# Patient Record
Sex: Male | Born: 1935 | Race: White | Hispanic: No | Smoking: Former smoker
Health system: Southern US, Community
[De-identification: ages and names within clinical notes are randomized; demographics above are authoritative.]

## PROBLEM LIST (undated history)

## (undated) DIAGNOSIS — D689 Coagulation defect, unspecified: Secondary | ICD-10-CM

## (undated) DIAGNOSIS — I10 Essential (primary) hypertension: Secondary | ICD-10-CM

## (undated) DIAGNOSIS — I519 Heart disease, unspecified: Secondary | ICD-10-CM

## (undated) DIAGNOSIS — I509 Heart failure, unspecified: Secondary | ICD-10-CM

## (undated) DIAGNOSIS — C801 Malignant (primary) neoplasm, unspecified: Secondary | ICD-10-CM

## (undated) DIAGNOSIS — E119 Type 2 diabetes mellitus without complications: Secondary | ICD-10-CM

## (undated) HISTORY — DX: Coagulation defect, unspecified: D68.9

## (undated) HISTORY — PX: HERNIA REPAIR: SHX51

## (undated) HISTORY — DX: Heart disease, unspecified: I51.9

---

## 2006-08-06 ENCOUNTER — Ambulatory Visit: Payer: Self-pay | Admitting: Orthopaedic Surgery

## 2006-08-27 ENCOUNTER — Ambulatory Visit: Payer: Self-pay | Admitting: Orthopaedic Surgery

## 2006-08-27 ENCOUNTER — Other Ambulatory Visit: Payer: Self-pay

## 2006-09-03 ENCOUNTER — Ambulatory Visit: Payer: Self-pay | Admitting: Orthopaedic Surgery

## 2006-09-05 ENCOUNTER — Emergency Department: Payer: Self-pay | Admitting: Internal Medicine

## 2007-02-17 ENCOUNTER — Ambulatory Visit: Payer: Self-pay | Admitting: Gastroenterology

## 2009-02-05 ENCOUNTER — Ambulatory Visit: Payer: Self-pay | Admitting: Internal Medicine

## 2009-03-03 ENCOUNTER — Ambulatory Visit: Payer: Self-pay | Admitting: Internal Medicine

## 2011-12-11 ENCOUNTER — Ambulatory Visit: Payer: Self-pay | Admitting: Internal Medicine

## 2012-07-28 ENCOUNTER — Ambulatory Visit: Payer: Self-pay | Admitting: Ophthalmology

## 2012-08-18 ENCOUNTER — Ambulatory Visit: Payer: Self-pay | Admitting: Ophthalmology

## 2012-12-15 ENCOUNTER — Ambulatory Visit: Payer: Self-pay

## 2013-09-11 DIAGNOSIS — I10 Essential (primary) hypertension: Secondary | ICD-10-CM | POA: Insufficient documentation

## 2013-09-11 DIAGNOSIS — I2510 Atherosclerotic heart disease of native coronary artery without angina pectoris: Secondary | ICD-10-CM | POA: Diagnosis present

## 2013-09-11 DIAGNOSIS — E119 Type 2 diabetes mellitus without complications: Secondary | ICD-10-CM

## 2014-02-22 ENCOUNTER — Ambulatory Visit: Payer: Self-pay | Admitting: Emergency Medicine

## 2015-12-31 ENCOUNTER — Other Ambulatory Visit: Payer: Self-pay | Admitting: Internal Medicine

## 2015-12-31 DIAGNOSIS — R1084 Generalized abdominal pain: Secondary | ICD-10-CM

## 2016-01-07 ENCOUNTER — Ambulatory Visit
Admission: RE | Admit: 2016-01-07 | Discharge: 2016-01-07 | Disposition: A | Discharge status: Home / Self Care | Payer: Medicare Other | Source: Ambulatory Visit | Attending: Internal Medicine | Admitting: Internal Medicine

## 2016-01-07 DIAGNOSIS — R590 Localized enlarged lymph nodes: Secondary | ICD-10-CM | POA: Diagnosis not present

## 2016-01-07 DIAGNOSIS — K429 Umbilical hernia without obstruction or gangrene: Secondary | ICD-10-CM | POA: Diagnosis not present

## 2016-01-07 DIAGNOSIS — R161 Splenomegaly, not elsewhere classified: Secondary | ICD-10-CM | POA: Diagnosis not present

## 2016-01-07 DIAGNOSIS — I700 Atherosclerosis of aorta: Secondary | ICD-10-CM | POA: Insufficient documentation

## 2016-01-07 DIAGNOSIS — R1084 Generalized abdominal pain: Secondary | ICD-10-CM | POA: Diagnosis not present

## 2016-01-07 DIAGNOSIS — N400 Enlarged prostate without lower urinary tract symptoms: Secondary | ICD-10-CM | POA: Insufficient documentation

## 2016-01-07 HISTORY — DX: Essential (primary) hypertension: I10

## 2016-01-07 HISTORY — DX: Type 2 diabetes mellitus without complications: E11.9

## 2016-01-07 MED ORDER — IOPAMIDOL (ISOVUE-300) INJECTION 61%
75.0000 mL | Freq: Once | INTRAVENOUS | Status: AC | PRN
  Administered 2016-01-07: 75 mL via INTRAVENOUS

## 2016-01-14 ENCOUNTER — Encounter: Payer: Self-pay | Admitting: Hematology and Oncology

## 2016-01-14 ENCOUNTER — Inpatient Hospital Stay: Payer: Medicare Other | Attending: Hematology and Oncology | Admitting: Hematology and Oncology

## 2016-01-14 ENCOUNTER — Inpatient Hospital Stay: Payer: Medicare Other

## 2016-01-14 VITALS — BP 125/76 | HR 99 | Temp 97.0°F | Resp 18 | Ht 71.0 in | Wt 201.5 lb

## 2016-01-14 DIAGNOSIS — R591 Generalized enlarged lymph nodes: Secondary | ICD-10-CM

## 2016-01-14 DIAGNOSIS — R599 Enlarged lymph nodes, unspecified: Secondary | ICD-10-CM

## 2016-01-14 DIAGNOSIS — R634 Abnormal weight loss: Secondary | ICD-10-CM | POA: Diagnosis not present

## 2016-01-14 DIAGNOSIS — R630 Anorexia: Secondary | ICD-10-CM

## 2016-01-14 DIAGNOSIS — E119 Type 2 diabetes mellitus without complications: Secondary | ICD-10-CM | POA: Insufficient documentation

## 2016-01-14 DIAGNOSIS — Z79899 Other long term (current) drug therapy: Secondary | ICD-10-CM | POA: Insufficient documentation

## 2016-01-14 DIAGNOSIS — Z87891 Personal history of nicotine dependence: Secondary | ICD-10-CM | POA: Insufficient documentation

## 2016-01-14 DIAGNOSIS — I519 Heart disease, unspecified: Secondary | ICD-10-CM

## 2016-01-14 DIAGNOSIS — I129 Hypertensive chronic kidney disease with stage 1 through stage 4 chronic kidney disease, or unspecified chronic kidney disease: Secondary | ICD-10-CM

## 2016-01-14 DIAGNOSIS — Z7982 Long term (current) use of aspirin: Secondary | ICD-10-CM | POA: Insufficient documentation

## 2016-01-14 DIAGNOSIS — N189 Chronic kidney disease, unspecified: Secondary | ICD-10-CM | POA: Insufficient documentation

## 2016-01-14 DIAGNOSIS — R109 Unspecified abdominal pain: Secondary | ICD-10-CM | POA: Diagnosis not present

## 2016-01-14 DIAGNOSIS — K5900 Constipation, unspecified: Secondary | ICD-10-CM | POA: Insufficient documentation

## 2016-01-14 DIAGNOSIS — M7989 Other specified soft tissue disorders: Secondary | ICD-10-CM

## 2016-01-14 DIAGNOSIS — N400 Enlarged prostate without lower urinary tract symptoms: Secondary | ICD-10-CM | POA: Insufficient documentation

## 2016-01-14 DIAGNOSIS — R161 Splenomegaly, not elsewhere classified: Secondary | ICD-10-CM | POA: Insufficient documentation

## 2016-01-14 DIAGNOSIS — Z86718 Personal history of other venous thrombosis and embolism: Secondary | ICD-10-CM

## 2016-01-14 DIAGNOSIS — Z7901 Long term (current) use of anticoagulants: Secondary | ICD-10-CM | POA: Insufficient documentation

## 2016-01-14 DIAGNOSIS — B189 Chronic viral hepatitis, unspecified: Secondary | ICD-10-CM

## 2016-01-14 DIAGNOSIS — D689 Coagulation defect, unspecified: Secondary | ICD-10-CM | POA: Insufficient documentation

## 2016-01-14 DIAGNOSIS — Z794 Long term (current) use of insulin: Secondary | ICD-10-CM | POA: Insufficient documentation

## 2016-01-14 LAB — COMPREHENSIVE METABOLIC PANEL
ALT: 29 U/L (ref 17–63)
AST: 37 U/L (ref 15–41)
Albumin: 3.5 g/dL (ref 3.5–5.0)
Alkaline Phosphatase: 47 U/L (ref 38–126)
Anion gap: 9 (ref 5–15)
BUN: 46 mg/dL — ABNORMAL HIGH (ref 6–20)
CO2: 22 mmol/L (ref 22–32)
Calcium: 8.9 mg/dL (ref 8.9–10.3)
Chloride: 102 mmol/L (ref 101–111)
Creatinine, Ser: 2.34 mg/dL — ABNORMAL HIGH (ref 0.61–1.24)
GFR calc Af Amer: 28 mL/min — ABNORMAL LOW (ref >60)
GFR calc non Af Amer: 24 mL/min — ABNORMAL LOW (ref >60)
Glucose, Bld: 198 mg/dL — ABNORMAL HIGH (ref 65–99)
Potassium: 5.5 mmol/L — ABNORMAL HIGH (ref 3.5–5.1)
Sodium: 133 mmol/L — ABNORMAL LOW (ref 135–145)
Total Bilirubin: 0.9 mg/dL (ref 0.3–1.2)
Total Protein: 6.7 g/dL (ref 6.5–8.1)

## 2016-01-14 LAB — CBC WITH DIFFERENTIAL/PLATELET
Basophils Absolute: 0.1 10*3/uL (ref 0–0.1)
Basophils Relative: 1 %
Eosinophils Absolute: 0.2 10*3/uL (ref 0–0.7)
Eosinophils Relative: 4 %
HCT: 48.1 % (ref 40.0–52.0)
Hemoglobin: 15.6 g/dL (ref 13.0–18.0)
Lymphocytes Relative: 11 %
Lymphs Abs: 0.7 10*3/uL — ABNORMAL LOW (ref 1.0–3.6)
MCH: 25.2 pg — ABNORMAL LOW (ref 26.0–34.0)
MCHC: 32.4 g/dL (ref 32.0–36.0)
MCV: 77.8 fL — ABNORMAL LOW (ref 80.0–100.0)
Monocytes Absolute: 0.8 10*3/uL (ref 0.2–1.0)
Monocytes Relative: 14 %
Neutro Abs: 4.4 10*3/uL (ref 1.4–6.5)
Neutrophils Relative %: 70 %
Platelets: 99 10*3/uL — ABNORMAL LOW (ref 150–440)
RBC: 6.18 MIL/uL — ABNORMAL HIGH (ref 4.40–5.90)
RDW: 19 % — ABNORMAL HIGH (ref 11.5–14.5)
WBC: 6.2 10*3/uL (ref 3.8–10.6)

## 2016-01-14 LAB — LACTATE DEHYDROGENASE: LDH: 709 U/L — ABNORMAL HIGH (ref 98–192)

## 2016-01-14 LAB — PSA: PSA: 6.97 ng/mL — ABNORMAL HIGH (ref 0.00–4.00)

## 2016-01-14 LAB — URIC ACID: Uric Acid, Serum: 12.7 mg/dL — ABNORMAL HIGH (ref 4.4–7.6)

## 2016-01-14 NOTE — Progress Notes (Signed)
[facility] day:  01/14/2016  Chief Complaint: Hayden Martin is a 82 y.o. 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 Ouida Sills for assessment and management.  HPI:  The patient was seen by Dr. Ouida Sills on 12/31/2015.  At that time, he noted a 25 pound weight loss since the spring, and 14 pounds since the late summer.  He had mild abdominal pain, no appetite and constipation.  Labs revealed a hematocrit of 40.4, hemoglobin 12.9, MCV 80.5, platelets 137,000, WBC 5000 with an ANC of 2960.  Differential revealed 59% segs, 22% lymphs, 13% monocytes, 4% eosinophils and 1% basophils.  Comprehensive metabolic panel included a BUN of 33, creatinine 1.6, calcium 10.5, albumin 3.2 with normal liver function tests.  PSA was 2.67 on 01/18/2014.  Abdomen and pelvic CT scan on 01/07/2016 revealed multiple solid masses within the spleen.  There was extensive retroperitoneal, portal and mesenteric lymphadenopathy.  Retrocrural node measured 2.1 x 2.0 cm.  Periportal node measured 2.6 x 5.8 cm. Confluent nodal mass is in the retroperitoneum surrounding the great vessels measured 5.6 x 3.8 cmon the left and 5.3 x 2.9 cm on the right.  Left iliac node measured 1.7 x 2.6 cm.  There was possible pleural disease on the left.  The prostate was markedly enlarged.  There was no disease in the liver.  The patient notes a history of left lower extremity thrombosis.  He states that he had 2 clots 1 year apart and is on life long anticoagulation.  He takes Xarelto, but sometimes forgets to take it.  He has chronic renal insufficiency (Cr 1.3- 1.4).  The patient lives alone.  He is able to take care of his activities of daily living (ADLs).  He spends the majority of his day resting.   Past Medical History:  Diagnosis Date  . Clotting disorder ([place])   . Diabetes mellitus without complication ([place])   . Heart disease   . Hypertension     Past  Surgical History:  Procedure Laterality Date  . HERNIA REPAIR      No family history on file.  Social History:  reports that he quit smoking about 30 years ago. He smoked 1.50 packs per day. He does not have any smokeless tobacco history on file. His alcohol and drug histories are not on file.  He was in the [employer].  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  He has 3 children who live locally.  The patient is accompanied by his son, Hayden Martin, today.  Allergies:  Allergies  Allergen Reactions  . Codeine Itching  . Penicillins Itching    Current Medications: Current Outpatient Prescriptions  Medication Sig Dispense Refill  . aspirin (GOODSENSE ASPIRIN) 325 MG tablet Take by mouth.    [name] atorvastatin (LIPITOR) 40 MG tablet Take by mouth.    . carvedilol (COREG) 6.25 MG tablet TAKE ONE (1) TABLET BY MOUTH TWO (2) TIMES DAILY    . insulin aspart (NOVOLOG) 100 UNIT/ML FlexPen Inject into the skin.    [name] ketoconazole (NIZORAL) 2 % cream APPLY AT BEDTIME DAILY TO AFFECTED AREAS    . lisinopril-hydrochlorothiazide (PRINZIDE,ZESTORETIC) 20-25 MG tablet TAKE ONE (1) TABLET BY MOUTH EVERY DAY    . rivaroxaban (XARELTO) 20 MG TABS tablet Take by mouth.    . Insulin Glargine (LANTUS [place]) Inject into the skin. Inject 35 units subcutaneously at bedtime.  No current facility-administered medications for this visit.     Review of Systems:  GENERAL:  No energy.  Rests most of day.  No fevers or sweats.  Weight loss of 30-35 pounds in 2-3 months. PERFORMANCE STATUS (ECOG):  2 HEENT:  No visual changes, runny nose, sore throat, mouth sores or tenderness. Lungs: Shortness of breath on exertion.  Little non-productive cough.  No hemoptysis. Cardiac:  No chest pain, palpitations, orthopnea, or PND. GI:  No appetite.  Two "spells of nausea".   Black stools x 2-3 months.  No vomiting, diarrhea, constipation, or hematochezia. GU:  Decreased urination.  No urgency, frequency, dysuria,  or hematuria. Musculoskeletal:  Back issues.  Joints hurt.  No muscle tenderness. Extremities:  No pain or swelling. Skin:  Pruritus.  No rashes or skin changes. Neuro:  No headache, numbness or weakness, or coordination issues.  Balance issues. Endocrine:  Diabetes.  No thyroid issues, hot flashes or night sweats. Psych:  No mood changes, depression or anxiety. Pain:  No focal pain. Review of systems:  All other systems reviewed and found to be negative.  Physical Exam: Blood pressure 125/76, pulse 99, temperature 97 F (36.1 C), temperature source Tympanic, resp. rate 18, height _0  (1.803 m), weight 201 lb 8 oz (91.4 kg). GENERAL:  Elderly gentleman sitting comfortably in a wheelchair in the exam room in no acute distress.  He needs assistance onto the exam table. MENTAL STATUS:  Alert and oriented to person, place and time. HEAD:  Wearing an Scientist, clinical (histocompatibility and immunogenetics).  Gray hair.  Male pattern baldness.  Normocephalic, atraumatic, face symmetric, no Cushingoid features. EYES:  Blue eyes.  Pupils equal round and reactive to light and accomodation.  No conjunctivitis or scleral icterus. ENT:  Hearing aide.  Oropharynx clear without lesion.  Tongue normal. Mucous membranes moist.  RESPIRATORY:  Decreased breath sounds left base.  Clear to auscultation without rales, wheezes or rhonchi. CARDIOVASCULAR:  Regular rate and rhythm without murmur, rub or gallop. ABDOMEN:  Soft, non-tender, with active bowel sounds, and no hepatomegaly.  Left upper quadrant fullness.  No masses. SKIN:  No rashes, ulcers or lesions. EXTREMITIES:  Left lower extremity edema below the knee.  No skin discoloration or tenderness.  No palpable cords. LYMPH NODES: No palpable cervical, supraclavicular, axillary or inguinal adenopathy  NEUROLOGICAL: Unremarkable. PSYCH:  Appropriate.   Appointment on 01/14/2016  Component Date Value Ref Range Status  . WBC 01/14/2016 6.2  3.8 - 10.6 K/uL Final  . RBC 01/14/2016 6.18*  4.40 - 5.90 MIL/uL Final  . Hemoglobin 01/14/2016 15.6  13.0 - 18.0 g/dL Final  . HCT 01/14/2016 48.1  40.0 - 52.0 % Final  . MCV 01/14/2016 77.8* 80.0 - 100.0 fL Final  . MCH 01/14/2016 25.2* 26.0 - 34.0 pg Final  . MCHC 01/14/2016 32.4  32.0 - 36.0 g/dL Final  . RDW 01/14/2016 19.0* 11.5 - 14.5 % Final  . Platelets 01/14/2016 99* 150 - 440 K/uL Final  . Neutrophils Relative % 01/14/2016 70  % Final  . Neutro Abs 01/14/2016 4.4  1.4 - 6.5 K/uL Final  . Lymphocytes Relative 01/14/2016 11  % Final  . Lymphs Abs 01/14/2016 0.7* 1.0 - 3.6 K/uL Final  . Monocytes Relative 01/14/2016 14  % Final  . Monocytes Absolute 01/14/2016 0.8  0.2 - 1.0 K/uL Final  . Eosinophils Relative 01/14/2016 4  % Final  . Eosinophils Absolute 01/14/2016 0.2  0 - 0.7 K/uL Final  . Basophils Relative 01/14/2016  1  % Final  . Basophils Absolute 01/14/2016 0.1  0 - 0.1 K/uL Final  . Sodium 01/14/2016 133* 135 - 145 mmol/L Final  . Potassium 01/14/2016 5.5* 3.5 - 5.1 mmol/L Final  . Chloride 01/14/2016 102  101 - 111 mmol/L Final  . CO2 01/14/2016 22  22 - 32 mmol/L Final  . Glucose, Bld 01/14/2016 198* 65 - 99 mg/dL Final  . BUN 01/14/2016 46* 6 - 20 mg/dL Final  . Creatinine, Ser 01/14/2016 2.34* 0.61 - 1.24 mg/dL Final  . Calcium 01/14/2016 8.9  8.9 - 10.3 mg/dL Final  . Total Protein 01/14/2016 6.7  6.5 - 8.1 g/dL Final  . Albumin 01/14/2016 3.5  3.5 - 5.0 g/dL Final  . AST 01/14/2016 37  15 - 41 U/L Final  . ALT 01/14/2016 29  17 - 63 U/L Final  . Alkaline Phosphatase 01/14/2016 47  38 - 126 U/L Final  . Total Bilirubin 01/14/2016 0.9  0.3 - 1.2 mg/dL Final  . GFR calc non Af Amer 01/14/2016 24* >60 mL/min Final  . GFR calc Af Amer 01/14/2016 28* >60 mL/min Final   Comment: (NOTE) The eGFR has been calculated using the CKD EPI equation. This calculation has not been validated in all clinical situations. eGFR's persistently <60 mL/min signify possible Chronic Kidney Disease.   . Anion gap 01/14/2016 9   5 - 15 Final  . LDH 01/14/2016 709* 98 - 192 U/L Final  . Uric Acid, Serum 01/14/2016 12.7* 4.4 - 7.6 mg/dL Final  . PSA 01/14/2016 6.97* 0.00 - 4.00 ng/mL Final   Comment: (NOTE) While PSA levels of <=4.0 ng/ml are reported as reference range, some men with levels below 4.0 ng/ml can have prostate cancer and many men with PSA above 4.0 ng/ml do not have prostate cancer.  Other tests such as free PSA, age specific reference ranges, PSA velocity and PSA doubling time may be helpful especially in men less than 70 years old. Performed at [facility]     Assessment:  Hayden Martin is a 82 y.o. male with probable lymphoma presenting with a 30-35 pound weight loss in 2-3 months.  Abdomen and pelvic CT scan on 01/07/2016 revealed multiple solid masses within the spleen.  There was extensive retroperitoneal, portal and mesenteric lymphadenopathy.  Retrocrural node measured 2.1 x 2.0 cm.  Periportal node measured 2.6 x 5.8 cm. Confluent nodal mass is in the retroperitoneum surrounding the great vessels measured 5.6 x 3.8 cmon the left and 5.3 x 2.9 cm on the right.  Left iliac node measured 1.7 x 2.6 cm.  There was possible pleural disease on the left.  The prostate was markedly enlarged.  There was no disease in the liver.  The patient notes a history of left lower extremity thrombosis.  He has had 2 clots 1 year apart and is on life long anticoagulation.  He is on Xarelto.  He has chronic renal insufficiency (Cr 1.3- 1.4).  Symptomatically, he is fatigued.  He has had significant weight loss.  He denies fevers and sweats.  Exam reveals no palpable adenopathy.  Plan: 1. Discuss recent scans and likely diagnosis of lymphoma. 2. Labs today:  CBC with diff, CMP, LDH, uric acid, PSA. 3. PET scan:  assess disease and site for potential biopsy. 4. Left lower extremity duplex:  h/o left lower extremity DVT; new left lower extremity edema. 5. RTC after above.   Lequita Asal, MD   01/14/2016

## 2016-01-14 NOTE — Progress Notes (Signed)
Patient here today as new evaluation regarding mets to spleen.  Patient has had no appetite for about 3 weeks.  States he is living off of tomato soup, fruit cocktail and applesauce. When he tries to eat he gets nauseated once the food goes in his mouth.  He is having loose stools every few hours that he describes as Almendra Loria.  Further states his urine is very scant.

## 2016-01-15 ENCOUNTER — Inpatient Hospital Stay: Payer: Medicare Other

## 2016-01-15 ENCOUNTER — Encounter: Payer: Self-pay | Admitting: Hematology and Oncology

## 2016-01-15 ENCOUNTER — Encounter: Payer: Self-pay | Admitting: *Deleted

## 2016-01-15 ENCOUNTER — Inpatient Hospital Stay
Admission: AD | Admit: 2016-01-15 | Discharge: 2016-01-28 | DRG: 673 | Disposition: A | Discharge status: Skilled Nursing Facility | Payer: Medicare Other | Source: Ambulatory Visit | Attending: Internal Medicine | Admitting: Internal Medicine

## 2016-01-15 DIAGNOSIS — R040 Epistaxis: Secondary | ICD-10-CM | POA: Diagnosis not present

## 2016-01-15 DIAGNOSIS — Z66 Do not resuscitate: Secondary | ICD-10-CM | POA: Diagnosis present

## 2016-01-15 DIAGNOSIS — E875 Hyperkalemia: Secondary | ICD-10-CM | POA: Diagnosis present

## 2016-01-15 DIAGNOSIS — E119 Type 2 diabetes mellitus without complications: Secondary | ICD-10-CM | POA: Diagnosis not present

## 2016-01-15 DIAGNOSIS — R599 Enlarged lymph nodes, unspecified: Secondary | ICD-10-CM

## 2016-01-15 DIAGNOSIS — Z23 Encounter for immunization: Secondary | ICD-10-CM

## 2016-01-15 DIAGNOSIS — R2681 Unsteadiness on feet: Secondary | ICD-10-CM

## 2016-01-15 DIAGNOSIS — K5900 Constipation, unspecified: Secondary | ICD-10-CM | POA: Diagnosis not present

## 2016-01-15 DIAGNOSIS — F419 Anxiety disorder, unspecified: Secondary | ICD-10-CM | POA: Diagnosis not present

## 2016-01-15 DIAGNOSIS — E1122 Type 2 diabetes mellitus with diabetic chronic kidney disease: Secondary | ICD-10-CM | POA: Diagnosis present

## 2016-01-15 DIAGNOSIS — Z7189 Other specified counseling: Secondary | ICD-10-CM | POA: Diagnosis not present

## 2016-01-15 DIAGNOSIS — C9110 Chronic lymphocytic leukemia of B-cell type not having achieved remission: Secondary | ICD-10-CM | POA: Diagnosis present

## 2016-01-15 DIAGNOSIS — C8518 Unspecified B-cell lymphoma, lymph nodes of multiple sites: Secondary | ICD-10-CM | POA: Diagnosis present

## 2016-01-15 DIAGNOSIS — Z7901 Long term (current) use of anticoagulants: Secondary | ICD-10-CM | POA: Diagnosis not present

## 2016-01-15 DIAGNOSIS — E43 Unspecified severe protein-calorie malnutrition: Secondary | ICD-10-CM | POA: Diagnosis present

## 2016-01-15 DIAGNOSIS — Z0189 Encounter for other specified special examinations: Secondary | ICD-10-CM | POA: Diagnosis not present

## 2016-01-15 DIAGNOSIS — D696 Thrombocytopenia, unspecified: Secondary | ICD-10-CM | POA: Diagnosis not present

## 2016-01-15 DIAGNOSIS — Z794 Long term (current) use of insulin: Secondary | ICD-10-CM

## 2016-01-15 DIAGNOSIS — N179 Acute kidney failure, unspecified: Secondary | ICD-10-CM | POA: Diagnosis present

## 2016-01-15 DIAGNOSIS — E871 Hypo-osmolality and hyponatremia: Secondary | ICD-10-CM | POA: Diagnosis present

## 2016-01-15 DIAGNOSIS — E785 Hyperlipidemia, unspecified: Secondary | ICD-10-CM | POA: Diagnosis present

## 2016-01-15 DIAGNOSIS — M6281 Muscle weakness (generalized): Secondary | ICD-10-CM

## 2016-01-15 DIAGNOSIS — I82432 Acute embolism and thrombosis of left popliteal vein: Secondary | ICD-10-CM | POA: Diagnosis present

## 2016-01-15 DIAGNOSIS — I2510 Atherosclerotic heart disease of native coronary artery without angina pectoris: Secondary | ICD-10-CM | POA: Diagnosis present

## 2016-01-15 DIAGNOSIS — R600 Localized edema: Secondary | ICD-10-CM

## 2016-01-15 DIAGNOSIS — N183 Chronic kidney disease, stage 3 unspecified: Secondary | ICD-10-CM

## 2016-01-15 DIAGNOSIS — C8330 Diffuse large B-cell lymphoma, unspecified site: Secondary | ICD-10-CM | POA: Diagnosis not present

## 2016-01-15 DIAGNOSIS — N400 Enlarged prostate without lower urinary tract symptoms: Secondary | ICD-10-CM | POA: Diagnosis present

## 2016-01-15 DIAGNOSIS — R740 Nonspecific elevation of levels of transaminase and lactic acid dehydrogenase [LDH]: Secondary | ICD-10-CM | POA: Diagnosis present

## 2016-01-15 DIAGNOSIS — C8590 Non-Hodgkin lymphoma, unspecified, unspecified site: Secondary | ICD-10-CM | POA: Diagnosis not present

## 2016-01-15 DIAGNOSIS — Z880 Allergy status to penicillin: Secondary | ICD-10-CM

## 2016-01-15 DIAGNOSIS — E872 Acidosis: Secondary | ICD-10-CM | POA: Diagnosis present

## 2016-01-15 DIAGNOSIS — E790 Hyperuricemia without signs of inflammatory arthritis and tophaceous disease: Secondary | ICD-10-CM | POA: Diagnosis present

## 2016-01-15 DIAGNOSIS — I824Z2 Acute embolism and thrombosis of unspecified deep veins of left distal lower extremity: Secondary | ICD-10-CM | POA: Diagnosis present

## 2016-01-15 DIAGNOSIS — Z87891 Personal history of nicotine dependence: Secondary | ICD-10-CM | POA: Diagnosis not present

## 2016-01-15 DIAGNOSIS — T380X5A Adverse effect of glucocorticoids and synthetic analogues, initial encounter: Secondary | ICD-10-CM | POA: Diagnosis not present

## 2016-01-15 DIAGNOSIS — D630 Anemia in neoplastic disease: Secondary | ICD-10-CM | POA: Diagnosis not present

## 2016-01-15 DIAGNOSIS — E11649 Type 2 diabetes mellitus with hypoglycemia without coma: Secondary | ICD-10-CM | POA: Diagnosis not present

## 2016-01-15 DIAGNOSIS — I129 Hypertensive chronic kidney disease with stage 1 through stage 4 chronic kidney disease, or unspecified chronic kidney disease: Secondary | ICD-10-CM | POA: Diagnosis present

## 2016-01-15 DIAGNOSIS — E1165 Type 2 diabetes mellitus with hyperglycemia: Secondary | ICD-10-CM | POA: Diagnosis not present

## 2016-01-15 DIAGNOSIS — Z6828 Body mass index (BMI) 28.0-28.9, adult: Secondary | ICD-10-CM

## 2016-01-15 DIAGNOSIS — R634 Abnormal weight loss: Secondary | ICD-10-CM | POA: Diagnosis not present

## 2016-01-15 DIAGNOSIS — Z515 Encounter for palliative care: Secondary | ICD-10-CM

## 2016-01-15 DIAGNOSIS — D6959 Other secondary thrombocytopenia: Secondary | ICD-10-CM | POA: Diagnosis present

## 2016-01-15 DIAGNOSIS — R531 Weakness: Secondary | ICD-10-CM | POA: Diagnosis present

## 2016-01-15 DIAGNOSIS — N189 Chronic kidney disease, unspecified: Secondary | ICD-10-CM | POA: Diagnosis not present

## 2016-01-15 DIAGNOSIS — R591 Generalized enlarged lymph nodes: Secondary | ICD-10-CM

## 2016-01-15 DIAGNOSIS — Z888 Allergy status to other drugs, medicaments and biological substances status: Secondary | ICD-10-CM

## 2016-01-15 DIAGNOSIS — C8580 Other specified types of non-Hodgkin lymphoma, unspecified site: Secondary | ICD-10-CM

## 2016-01-15 DIAGNOSIS — E883 Tumor lysis syndrome: Secondary | ICD-10-CM | POA: Diagnosis present

## 2016-01-15 DIAGNOSIS — Z86718 Personal history of other venous thrombosis and embolism: Secondary | ICD-10-CM

## 2016-01-15 DIAGNOSIS — R609 Edema, unspecified: Secondary | ICD-10-CM

## 2016-01-15 DIAGNOSIS — R630 Anorexia: Secondary | ICD-10-CM | POA: Diagnosis not present

## 2016-01-15 DIAGNOSIS — I10 Essential (primary) hypertension: Secondary | ICD-10-CM | POA: Diagnosis not present

## 2016-01-15 DIAGNOSIS — I82402 Acute embolism and thrombosis of unspecified deep veins of left lower extremity: Secondary | ICD-10-CM | POA: Diagnosis not present

## 2016-01-15 DIAGNOSIS — Z7982 Long term (current) use of aspirin: Secondary | ICD-10-CM

## 2016-01-15 DIAGNOSIS — Z79899 Other long term (current) drug therapy: Secondary | ICD-10-CM

## 2016-01-15 LAB — GLUCOSE, CAPILLARY: Glucose-Capillary: 148 mg/dL — ABNORMAL HIGH (ref 65–99)

## 2016-01-15 MED ORDER — PNEUMOCOCCAL VAC POLYVALENT 25 MCG/0.5ML IJ INJ: 0.5000 mL | INJECTION | INTRAMUSCULAR | Status: DC

## 2016-01-15 MED ORDER — INSULIN ASPART 100 UNIT/ML ~~LOC~~ SOLN
0.0000 [IU] | Freq: Three times a day (TID) | SUBCUTANEOUS | Status: DC
  Administered 2016-01-16 – 2016-01-22 (×6): 1 [IU] via SUBCUTANEOUS
  Administered 2016-01-24: 17:00:00 7 [IU] via SUBCUTANEOUS
  Administered 2016-01-24 (×2): 3 [IU] via SUBCUTANEOUS
  Administered 2016-01-25: 12:00:00 9 [IU] via SUBCUTANEOUS
  Administered 2016-01-25: 7 [IU] via SUBCUTANEOUS
  Filled 2016-01-15: qty 3
  Filled 2016-01-15 (×4): qty 1
  Filled 2016-01-15: qty 3
  Filled 2016-01-15: qty 1
  Filled 2016-01-15: qty 7
  Filled 2016-01-15: qty 1
  Filled 2016-01-15: qty 7
  Filled 2016-01-15: qty 9

## 2016-01-15 MED ORDER — KETOCONAZOLE 2 % EX CREA
TOPICAL_CREAM | Freq: Every day | CUTANEOUS | Status: DC
  Administered 2016-01-16 – 2016-01-25 (×3): via TOPICAL
  Filled 2016-01-15: qty 15

## 2016-01-15 MED ORDER — ASPIRIN 325 MG PO TABS
325.0000 mg | ORAL_TABLET | Freq: Every day | ORAL | Status: DC
  Administered 2016-01-15 – 2016-01-17 (×3): 325 mg via ORAL
  Filled 2016-01-15 (×3): qty 1

## 2016-01-15 MED ORDER — HYDROCODONE-ACETAMINOPHEN 5-325 MG PO TABS: 1.0000 | ORAL_TABLET | ORAL | Status: DC | PRN

## 2016-01-15 MED ORDER — APIXABAN 5 MG PO TABS
5.0000 mg | ORAL_TABLET | Freq: Two times a day (BID) | ORAL | Status: DC
  Administered 2016-01-16 (×2): 5 mg via ORAL
  Filled 2016-01-15 (×2): qty 1

## 2016-01-15 MED ORDER — ONDANSETRON HCL 4 MG PO TABS: 4.0000 mg | ORAL_TABLET | Freq: Four times a day (QID) | ORAL | Status: DC | PRN

## 2016-01-15 MED ORDER — CARVEDILOL 6.25 MG PO TABS
6.2500 mg | ORAL_TABLET | Freq: Two times a day (BID) | ORAL | Status: DC
  Administered 2016-01-15 – 2016-01-28 (×19): 6.25 mg via ORAL
  Filled 2016-01-15 (×20): qty 1

## 2016-01-15 MED ORDER — INSULIN GLARGINE 100 UNIT/ML ~~LOC~~ SOLN
15.0000 [IU] | Freq: Every day | SUBCUTANEOUS | Status: DC
  Administered 2016-01-15 – 2016-01-17 (×3): 15 [IU] via SUBCUTANEOUS
  Filled 2016-01-15 (×3): qty 0.15

## 2016-01-15 MED ORDER — ONDANSETRON HCL 4 MG/2ML IJ SOLN: 4.0000 mg | Freq: Four times a day (QID) | INTRAMUSCULAR | Status: DC | PRN

## 2016-01-15 MED ORDER — ACETAMINOPHEN 325 MG PO TABS
650.0000 mg | ORAL_TABLET | Freq: Four times a day (QID) | ORAL | Status: DC | PRN
  Administered 2016-01-18: 650 mg via ORAL
  Filled 2016-01-15: qty 2

## 2016-01-15 MED ORDER — ACETAMINOPHEN 650 MG RE SUPP: 650.0000 mg | Freq: Four times a day (QID) | RECTAL | Status: DC | PRN

## 2016-01-15 MED ORDER — SODIUM CHLORIDE 0.9 % IV SOLN
INTRAVENOUS | Status: DC
  Administered 2016-01-15 – 2016-01-16 (×3): via INTRAVENOUS

## 2016-01-15 MED ORDER — RIVAROXABAN 10 MG PO TABS: 20.0000 mg | ORAL_TABLET | Freq: Every day | ORAL | Status: DC

## 2016-01-15 NOTE — Progress Notes (Signed)
ANTICOAGULATION CONSULT NOTE - Initial Consult  Pharmacy Consult for Apixaban  Indication:Hx of DVT  Allergies  Allergen Reactions  . Codeine Itching  . Penicillins Itching   Patient Measurements: Height: 5\' 11"  (180.3 cm) Weight: 203 lb 8 oz (92.3 kg) IBW/kg (Calculated) : 75.3  Vital Signs: Temp: 97.7 F (36.5 C) (11/14 1424) Temp Source: Oral (11/14 1424) BP: 126/63 (11/14 1424) Pulse Rate: 94 (11/14 1424)   Recent Labs  01/14/16 1619  HGB 15.6  HCT 48.1  PLT 99*  CREATININE 2.34*    Estimated Creatinine Clearance: 28.3 mL/min (by C-G formula based on SCr of 2.34 mg/dL (H)).  Medical History: Past Medical History:  Diagnosis Date  . Clotting disorder ([place])   . Diabetes mellitus without complication ([place])   . Heart disease   . Hypertension    Assessment: 82 yo male with worsening renal failure and history of DVT. Patient was taking Xarelto (rivaroxaban) 20mg  daily outpatient. Last dose was 11/14 at 1200. Due to worsening renal failure, MD wants to transition patient to Eliquis (apixaban). Pharmacy consulted for dosing.   Plan:  Will start patient on Apixaban 5mg  BID 24 hours after last Xarelto dose.  Patient will need counseling on new medication prior to discharge.  Monitor Plts, Hgb, and Hct and signs and sx of bleeding.   Pernell Dupre, PharmD Clinical Pharmacist  01/15/2016,5:58 PM

## 2016-01-15 NOTE — H&P (Signed)
[facility] [place] NAME: Hayden Martin    MR#:  [id number]  DATE OF BIRTH:  1931-06-18  DATE OF ADMISSION:  01/15/2016  PRIMARY CARE PHYSICIAN: Kirk Ruths., MD   REQUESTING/REFERRING PHYSICIAN: Joice Lofts MD  CHIEF COMPLAINT:  No chief complaint on file.   HISTORY OF PRESENT ILLNESS: Hayden Martin  is a 82 y.o. male with a known history of  DVT, diabetes, hypertension who is referred from oncology office due to worsening renal failure, elevated LDH level, and elevated uric acid level who was seen by oncology first time in November 13. Patient previously was noted to have weight loss and abnormal blood work and abdominal CT revealed multiple solid masses within the spleen as well as extensive lymphadenopathy. Patient was planned to have a PET scan and a biopsy of this. Who went for follow-up and had repeated blood work which showed his has elevated creatinine and his uric acid level is high and LDH is high as well. Therefore he is admitted for further evaluation and therapy. Patient complains of feeling very weak and tired and has not been able to walk.  PAST MEDICAL HISTORY:   Past Medical History:  Diagnosis Date  . Clotting disorder ([place])   . Diabetes mellitus without complication ([place])   . Heart disease   . Hypertension     PAST SURGICAL HISTORY: Past Surgical History:  Procedure Laterality Date  . HERNIA REPAIR      SOCIAL HISTORY:  Social History  Substance Use Topics  . Smoking status: Former Smoker    Packs/day: 1.50    Quit date: 01/01/1986  . Smokeless tobacco: Former Systems developer     Comment: Smoked for 20 years  . Alcohol use Not on file    FAMILY HISTORY: History reviewed. No pertinent family history.  DRUG ALLERGIES:  Allergies  Allergen Reactions  . Codeine Itching  . Penicillins Itching    REVIEW OF SYSTEMS:   CONSTITUTIONAL: No fever, postive fatigue and weakness.  EYES: No blurred or  double vision.  EARS, NOSE, AND THROAT: No tinnitus or ear pain.  RESPIRATORY: No cough, shortness of breath, wheezing or hemoptysis.  CARDIOVASCULAR: No chest pain, orthopnea, edema.  GASTROINTESTINAL: No nausea, vomiting, diarrhea or abdominal pain.  GENITOURINARY: No dysuria, hematuria.  ENDOCRINE: No polyuria, nocturia,  HEMATOLOGY: No anemia, easy bruising or bleeding SKIN: No rash or lesion. MUSCULOSKELETAL: No joint pain or arthritis.  Positive for left lower extremity swelling NEUROLOGIC: No tingling, numbness, weakness.  PSYCHIATRY: No anxiety or depression.   MEDICATIONS AT HOME:  Prior to Admission medications   Medication Sig Start Date End Date Taking? Authorizing Provider  aspirin (GOODSENSE ASPIRIN) 325 MG tablet Take by mouth.    Historical Provider, MD  atorvastatin (LIPITOR) 40 MG tablet Take by mouth.    Historical Provider, MD  carvedilol (COREG) 6.25 MG tablet TAKE ONE (1) TABLET BY MOUTH TWO (2) TIMES DAILY 02/05/15   Historical Provider, MD  insulin aspart (NOVOLOG) 100 UNIT/ML FlexPen Inject into the skin.    Historical Provider, MD  Insulin Glargine (LANTUS ) Inject into the skin. Inject 35 units subcutaneously at bedtime.    Historical Provider, MD  ketoconazole (NIZORAL) 2 % cream APPLY AT BEDTIME DAILY TO AFFECTED AREAS 12/27/14   Historical Provider, MD  lisinopril-hydrochlorothiazide (PRINZIDE,ZESTORETIC) 20-25 MG tablet TAKE ONE (1) TABLET BY MOUTH EVERY DAY 08/20/15   Historical Provider, MD  rivaroxaban (XARELTO) 20 MG TABS tablet Take by mouth.  Historical Provider, MD      PHYSICAL EXAMINATION:   VITAL SIGNS: Blood pressure 126/63, pulse 94, temperature 97.7 F (36.5 C), temperature source Oral, height 5\' 11"  (1.803 m), weight 203 lb 8 oz (92.3 kg), SpO2 94 %.  GENERAL:  82 y.o.-year-old patient lying in the bed with no acute distress.  EYES: Pupils equal, round, reactive to light and accommodation. No scleral icterus. Extraocular muscles intact.   HEENT: Head atraumatic, normocephalic. Oropharynx and nasopharynx clear.  NECK:  Supple, no jugular venous distention. No thyroid enlargement, no tenderness.  LUNGS: Normal breath sounds bilaterally, no wheezing, rales,rhonchi or crepitation. No use of accessory muscles of respiration.  CARDIOVASCULAR: S1, S2 normal. No murmurs, rubs, or gallops.  ABDOMEN: Soft, nontender, nondistended. Bowel sounds present. No organomegaly or mass.  EXTREMITIES: No pedal edema, cyanosis, or clubbing. Left lower extremity swollen NEUROLOGIC: Cranial nerves II through XII are intact. Muscle strength 5/5 in all extremities. Sensation intact. Gait not checked.  PSYCHIATRIC: The patient is alert and oriented x 3.  SKIN: No obvious rash, lesion, or ulcer.   LABORATORY PANEL:   CBC  Recent Labs Lab 01/14/16 1619  WBC 6.2  HGB 15.6  HCT 48.1  PLT 99*  MCV 77.8*  MCH 25.2*  MCHC 32.4  RDW 19.0*  LYMPHSABS 0.7*  MONOABS 0.8  EOSABS 0.2  BASOSABS 0.1   ------------------------------------------------------------------------------------------------------------------  Chemistries   Recent Labs Lab 01/14/16 1619  NA 133*  K 5.5*  CL 102  CO2 22  GLUCOSE 198*  BUN 46*  CREATININE 2.34*  CALCIUM 8.9  AST 37  ALT 29  ALKPHOS 47  BILITOT 0.9   ------------------------------------------------------------------------------------------------------------------ estimated creatinine clearance is 28.3 mL/min (by C-G formula based on SCr of 2.34 mg/dL (H)). ------------------------------------------------------------------------------------------------------------------ No results for input(s): TSH, T4TOTAL, T3FREE, THYROIDAB in the last 72 hours.  Invalid input(s): FREET3   Coagulation profile No results for input(s): INR, PROTIME in the last 168 hours. ------------------------------------------------------------------------------------------------------------------- No results for input(s):  DDIMER in the last 72 hours. -------------------------------------------------------------------------------------------------------------------  Cardiac Enzymes No results for input(s): CKMB, TROPONINI, MYOGLOBIN in the last 168 hours.  Invalid input(s): CK ------------------------------------------------------------------------------------------------------------------ Invalid input(s): POCBNP  ---------------------------------------------------------------------------------------------------------------  Urinalysis No results found for: COLORURINE, APPEARANCEUR, LABSPEC, PHURINE, GLUCOSEU, HGBUR, BILIRUBINUR, KETONESUR, PROTEINUR, UROBILINOGEN, NITRITE, LEUKOCYTESUR   RADIOLOGY: No results found.  EKG: Orders placed or performed in visit on 08/27/06  . EKG 12-Lead    IMPRESSION AND PLAN: Patient's 82 year old white male being admitted for tumor lysis syndrome  1. Tumor lysis syndrome We'll treat patient with aggressive IV fluids, and oncology will be consult and   2. Acute renal failure Suspect due to tumor lysis syndrome I have discussed with nephrology today will see the patient. If renal function does not improve may need to repeat imaging to further evaluate for any evidence of postobstructive nephropathy  3. Diabetes type 2 I will place patient on sliding scale insulin Decrease his dose of insulin  4. Hyperlipidemia I will hold his Lipitor for now  5. Essential hypertension we will hold lisinopril and HCTZ in light of acute renal failure   6. History of DVT Due to his renal failure will changes Xarelto  To epixiban and doppler his leg   All the records are reviewed and case discussed with ED provider. Management plans discussed with the patient, family and they are in agreement.  CODE STATUS:    Code Status Orders        Start     Ordered   01/15/16  1512  Full code  Continuous     01/15/16 1511    Code Status History    Date Active Date  Inactive Code Status Order ID Comments User Context   This patient has a current code status but no historical code status.    Advance Directive Documentation   [place] Most Recent Value  Type of Advance Directive  Living will  Pre-existing out of facility DNR order (yellow form or pink MOST form)  No data  "MOST" Form in Place?  No data       TOTAL TIME TAKING CARE OF THIS PATIENT:55 minutes.    Dustin Flock M.D on 01/15/2016 at 3:30 PM  Between 7am to 6pm - Pager - [phone]  After 6pm go to [email or web address] - password [email or web address] [facility]  Office  [phone]  CC: Primary care physician; Kirk Ruths., MD

## 2016-01-15 NOTE — Progress Notes (Unsigned)
Dr. Mike Gip reviewed labs from yest. And what was resulted today and pt's LDH -709 likely indicating lymphoma.  Also his uric acid is 12.7, k-5.5 creat- 2.34 all indicating that he has tumor lysis syndrome.  The son [name] was notified by dr . Alvia Grove that pt needs to be put in hospital.  I called the oncology floor and spoke to [place] and gave all info above as report.  Joe the son was going to bring pt to hospital to adm. Desk and then have volunteer take pt to 115.  Pt. Lives alone and does hi sown ADL's and his son checks on him.

## 2016-01-15 NOTE — Consult Note (Signed)
[facility]  CONSULT NOTE    Date: 01/15/2016                  Patient Name:  Hayden Martin  MRN: [id number]  DOB: 1931/09/09  Age / Sex: 82 y.o., male         PCP: Kirk Ruths., MD                 Service Requesting Consult: Dr. Serita Grit                 Reason for Consult: Acute renal failure            History of Present Illness: [name] is a 82 y.o. white male with new onset lymphoma, hypertension, diabetes mellitus type II, hyperlipidemia, coronary artery disease, DVT on xarelto, BPH, RBB, who was admitted to [facility] on 01/15/2016 for tumor lysis syndrome renal insufficiency hyperuremia   Family at bedside.    Medications: Outpatient medications: Prescriptions Prior to Admission  Medication Sig Dispense Refill Last Dose  . aspirin (GOODSENSE ASPIRIN) 325 MG tablet Take by mouth.   Taking  . atorvastatin (LIPITOR) 20 MG tablet Take 20 mg by mouth daily.    Taking  . carvedilol (COREG) 6.25 MG tablet TAKE ONE (1) TABLET BY MOUTH TWO (2) TIMES DAILY   Taking  . insulin aspart (NOVOLOG) 100 UNIT/ML FlexPen Inject 8 Units into the skin 3 (three) times daily with meals.    Taking  . Insulin Glargine (LANTUS [place]) Inject into the skin. Inject 35 units subcutaneously at bedtime.     [name] ketoconazole (NIZORAL) 2 % cream APPLY AT BEDTIME DAILY TO AFFECTED AREAS   Taking  . lisinopril-hydrochlorothiazide (PRINZIDE,ZESTORETIC) 20-25 MG tablet TAKE ONE (1) TABLET BY MOUTH EVERY DAY   Taking  . rivaroxaban (XARELTO) 20 MG TABS tablet Take by mouth.   Taking    Current medications: Current Facility-Administered Medications  Medication Dose Route Frequency Provider Last Rate Last Dose  . 0.9 %  sodium chloride infusion   Intravenous Continuous Dustin Flock, MD 150 mL/hr at 01/15/16 1542    . acetaminophen (TYLENOL) tablet 650 mg  650 mg Oral Q6H PRN Dustin Flock, MD       Or  . acetaminophen (TYLENOL) suppository 650 mg  650 mg Rectal Q6H  PRN Dustin Flock, MD      . aspirin tablet 325 mg  325 mg Oral Daily Dustin Flock, MD   325 mg at 01/15/16 1521  . carvedilol (COREG) tablet 6.25 mg  6.25 mg Oral BID WC Dustin Flock, MD   6.25 mg at 01/15/16 1521  . HYDROcodone-acetaminophen (NORCO/VICODIN) 5-325 MG per tablet 1-2 tablet  1-2 tablet Oral Q4H PRN Dustin Flock, MD      . insulin glargine (LANTUS) injection 15 Units  15 Units Subcutaneous QHS Dustin Flock, MD      . ketoconazole (NIZORAL) 2 % cream   Topical Daily Dustin Flock, MD      . ondansetron (ZOFRAN) tablet 4 mg  4 mg Oral Q6H PRN Dustin Flock, MD       Or  . ondansetron (ZOFRAN) injection 4 mg  4 mg Intravenous Q6H PRN Dustin Flock, MD      . Derrill Memo ON 01/16/2016] pneumococcal 23 valent vaccine (PNU-IMMUNE) injection 0.5 mL  0.5 mL Intramuscular Tomorrow-1000 Dustin Flock, MD          Allergies: Allergies  Allergen Reactions  . Codeine Itching  . Penicillins  Itching      Past Medical History: Past Medical History:  Diagnosis Date  . Clotting disorder ([place])   . Diabetes mellitus without complication ([place])   . Heart disease   . Hypertension      Past Surgical History: Past Surgical History:  Procedure Laterality Date  . HERNIA REPAIR       Family History: History reviewed. No pertinent family history.   Social History: Social History   Social History  . Marital status: Widowed    Spouse name: N/A  . Number of children: N/A  . Years of education: N/A   Occupational History  . Not on file.   Social History Main Topics  . Smoking status: Former Smoker    Packs/day: 1.50    Quit date: 01/01/1986  . Smokeless tobacco: Former Systems developer     Comment: Smoked for 20 years  . Alcohol use Not on file  . Drug use: Unknown  . Sexual activity: Not on file   Other Topics Concern  . Not on file   Social History Narrative  . No narrative on file     Review of Systems: Review of Systems  Constitutional: Positive for  malaise/fatigue and weight loss. Negative for chills, diaphoresis and fever.  HENT: Negative for congestion, ear discharge, ear pain, hearing loss, nosebleeds, sinus pain, sore throat and tinnitus.   Eyes: Negative for blurred vision, double vision, photophobia, pain, discharge and redness.  Respiratory: Negative for cough, hemoptysis, sputum production, shortness of breath, wheezing and stridor.   Cardiovascular: Positive for leg swelling. Negative for chest pain, palpitations, orthopnea, claudication and PND.  Gastrointestinal: Negative for abdominal pain, blood in stool, constipation, diarrhea, heartburn, melena, nausea and vomiting.  Genitourinary: Negative for dysuria, flank pain, frequency, hematuria and urgency.  Musculoskeletal: Negative for back pain, falls, joint pain, myalgias and neck pain.  Skin: Negative for itching and rash.  Neurological: Negative for dizziness, tingling, tremors, sensory change, speech change, focal weakness, seizures, loss of consciousness, weakness and headaches.  Endo/Heme/Allergies: Negative for environmental allergies and polydipsia. Does not bruise/bleed easily.  Psychiatric/Behavioral: Negative for depression, hallucinations, memory loss, substance abuse and suicidal ideas. The patient is not nervous/anxious and does not have insomnia.     Vital Signs: Blood pressure 126/63, pulse 94, temperature 97.7 F (36.5 C), temperature source Oral, height 5' 11"  (1.803 m), weight 92.3 kg (203 lb 8 oz), SpO2 94 %.  Weight trends: Filed Weights   01/15/16 1430 01/15/16 1533 01/15/16 1613  Weight: 92.3 kg (203 lb 8 oz) 92.3 kg (203 lb 8 oz) 92.3 kg (203 lb 8 oz)    Physical Exam: General: NAD,   Head: Normocephalic, atraumatic. Moist oral mucosal membranes  Eyes: Anicteric, PERRL  Neck: Supple, trachea midline  Lungs:  Clear to auscultation  Heart: Regular rate and rhythm  Abdomen:  Soft, nontender,   Extremities:  1+ peripheral edema.  Neurologic:  Nonfocal, moving all four extremities  Skin: No lesions        Lab results: Basic Metabolic Panel:  Recent Labs Lab 01/14/16 1619  NA 133*  K 5.5*  CL 102  CO2 22  GLUCOSE 198*  BUN 46*  CREATININE 2.34*  CALCIUM 8.9    Liver Function Tests:  Recent Labs Lab 01/14/16 1619  AST 37  ALT 29  ALKPHOS 47  BILITOT 0.9  PROT 6.7  ALBUMIN 3.5   No results for input(s): LIPASE, AMYLASE in the last 168 hours. No results for input(s): AMMONIA in the last  168 hours.  CBC:  Recent Labs Lab 01/14/16 1619  WBC 6.2  NEUTROABS 4.4  HGB 15.6  HCT 48.1  MCV 77.8*  PLT 99*    Cardiac Enzymes: No results for input(s): CKTOTAL, CKMB, CKMBINDEX, TROPONINI in the last 168 hours.  BNP: Invalid input(s): POCBNP  CBG: No results for input(s): GLUCAP in the last 168 hours.  Microbiology: No results found for this or any previous visit.  Coagulation Studies: No results for input(s): LABPROT, INR in the last 72 hours.  Urinalysis: No results for input(s): COLORURINE, LABSPEC, PHURINE, GLUCOSEU, HGBUR, BILIRUBINUR, KETONESUR, PROTEINUR, UROBILINOGEN, NITRITE, LEUKOCYTESUR in the last 72 hours.  Invalid input(s): APPERANCEUR    Imaging:  No results found.   Assessment & Plan: [name] is a 82 y.o. white male with new onset lymphoma, hypertension, diabetes mellitus type II, hyperlipidemia, coronary artery disease, DVT on xarelto, BPH, RBB, who was admitted to [facility] on 01/15/2016 for tumor lysis syndrome renal insufficiency hyperuremia   1. Acute renal failure on chronic kidney disease stage III: with hyperkalemia and hyponatremia: baseline creatinine of 1.6, eGFR of 42 from 12/31/15.  CT angiogram on 11/6. Poor PO intake: albumin low at 3.5 Tumor lysis syndrome with hyperuremia - Check G6PD deficiency - Agree with IV fluids - Discussed role of dialysis in acute renal failure with patient and family.     LOS: 0 Mikelle Myrick 11/14/20174:46 PM

## 2016-01-16 ENCOUNTER — Inpatient Hospital Stay: Payer: Medicare Other

## 2016-01-16 LAB — BASIC METABOLIC PANEL
ANION GAP: 7 (ref 5–15)
BUN: 34 mg/dL — ABNORMAL HIGH (ref 6–20)
CHLORIDE: 109 mmol/L (ref 101–111)
CO2: 20 mmol/L — ABNORMAL LOW (ref 22–32)
Calcium: 8 mg/dL — ABNORMAL LOW (ref 8.9–10.3)
Creatinine, Ser: 1.92 mg/dL — ABNORMAL HIGH (ref 0.61–1.24)
GFR calc Af Amer: 36 mL/min — ABNORMAL LOW (ref >60)
GFR, EST NON AFRICAN AMERICAN: 31 mL/min — AB (ref >60)
Glucose, Bld: 145 mg/dL — ABNORMAL HIGH (ref 65–99)
POTASSIUM: 5.1 mmol/L (ref 3.5–5.1)
SODIUM: 136 mmol/L (ref 135–145)

## 2016-01-16 LAB — CBC
HCT: 41.5 % (ref 40.0–52.0)
HEMOGLOBIN: 13.5 g/dL (ref 13.0–18.0)
MCH: 25.9 pg — ABNORMAL LOW (ref 26.0–34.0)
MCHC: 32.5 g/dL (ref 32.0–36.0)
MCV: 79.5 fL — AB (ref 80.0–100.0)
PLATELETS: 92 10*3/uL — AB (ref 150–440)
RBC: 5.21 MIL/uL (ref 4.40–5.90)
RDW: 19.6 % — ABNORMAL HIGH (ref 11.5–14.5)
WBC: 4.3 10*3/uL (ref 3.8–10.6)

## 2016-01-16 LAB — GLUCOSE 6 PHOSPHATE DEHYDROGENASE
G6PDH: 11 U/g{Hb} (ref 4.6–13.5)
HEMOGLOBIN: 12.9 g/dL (ref 12.6–17.7)

## 2016-01-16 LAB — GLUCOSE, CAPILLARY
GLUCOSE-CAPILLARY: 126 mg/dL — AB (ref 65–99)
GLUCOSE-CAPILLARY: 143 mg/dL — AB (ref 65–99)
GLUCOSE-CAPILLARY: 129 mg/dL — AB (ref 65–99)
GLUCOSE-CAPILLARY: 137 mg/dL — AB (ref 65–99)
Glucose-Capillary: 122 mg/dL — ABNORMAL HIGH (ref 65–99)

## 2016-01-16 LAB — LACTATE DEHYDROGENASE: LDH: 656 U/L — ABNORMAL HIGH (ref 98–192)

## 2016-01-16 LAB — PHOSPHORUS: PHOSPHORUS: 2.4 mg/dL — AB (ref 2.5–4.6)

## 2016-01-16 MED ORDER — SODIUM BICARBONATE 650 MG PO TABS
650.0000 mg | ORAL_TABLET | Freq: Two times a day (BID) | ORAL | Status: DC
  Administered 2016-01-16 – 2016-01-23 (×15): 650 mg via ORAL
  Filled 2016-01-16 (×15): qty 1

## 2016-01-16 MED ORDER — FLUDEOXYGLUCOSE F - 18 (FDG) INJECTION
12.0000 | Freq: Once | INTRAVENOUS | Status: AC | PRN
  Administered 2016-01-16: 12 via INTRAVENOUS

## 2016-01-16 MED ORDER — SODIUM BICARBONATE 8.4 % IV SOLN
INTRAVENOUS | Status: DC
  Filled 2016-01-16 (×3): qty 150

## 2016-01-16 MED ORDER — SODIUM CHLORIDE 0.9 % IV SOLN
INTRAVENOUS | Status: DC
  Administered 2016-01-16 – 2016-01-18 (×5): via INTRAVENOUS

## 2016-01-16 MED ORDER — ALLOPURINOL 100 MG PO TABS
300.0000 mg | ORAL_TABLET | Freq: Every day | ORAL | Status: DC
  Administered 2016-01-16 – 2016-01-28 (×13): 300 mg via ORAL
  Filled 2016-01-16 (×13): qty 3

## 2016-01-16 NOTE — Care Management Important Message (Signed)
Important Message  Patient Details  Name: Hayden Martin MRN: [id number] Date of Birth: March 24, 1931   Medicare Important Message Given:  Yes    Shelbie Ammons, RN 01/16/2016, 11:00 AM

## 2016-01-16 NOTE — Care Management (Signed)
Admitted to [facility] with the diagnosis of tumor lysis. Lives alone. Son is [name] [phone]). Seen Dr. Ouida Sills 2 weeks ago. No home health. No skilled facility. No home oxygen. No Life Alert. Prescriptions are filled at [facility]. No falls. Weight loss of 30 lbs since May. No falls. Uses a cane or rolling walker to aid in ambulation.  Scheduled for PET scan today. Shelbie Ammons RN MSN CCM Care Management [phone]

## 2016-01-16 NOTE — Progress Notes (Signed)
Dr. Mike Gip paged to see if she will be rounding on patient today. Dr. Mike Gip stated she is in [place] on Wednesday's but plans to see patient tomorrow (01/17/16) after 5pm. Patient and family updated.

## 2016-01-16 NOTE — Progress Notes (Signed)
Initial Nutrition Assessment  DOCUMENTATION CODES:   Severe malnutrition in context of chronic illness  INTERVENTION:  1. K[employer] pudding TID, patient does not like ONS  NUTRITION DIAGNOSIS:   Malnutrition related to chronic illness as evidenced by percent weight loss, energy intake < 75% for > or equal to 1 month.  GOAL:   Patient will meet greater than or equal to 90% of their needs  MONITOR:   PO intake, Supplement acceptance, I & O's, Labs, Weight trends  REASON FOR ASSESSMENT:   Malnutrition Screening Tool    ASSESSMENT:   Hayden Martin  is a 82 y.o. male with a known history of  DVT, diabetes, hypertension who is referred from oncology office due to worsening renal failure, elevated LDH level, and elevated uric acid level who was seen by oncology first time in November 13  Spoke with patient's Sons, daughter in Sports coach at bedside. Patient was sleeping during my visit, did not wake. Per Sons, patient is hard of hearing. They admit to 34# wt loss over 6 weeks with patient. Per care everywhere, he exhibits a 24#/10.5% severe wt loss over 3 months.  They state at home he was eating well until the onset of constant diarrhea. At that point, "anything he put in his mouth caused nausea" and he only ate fruit cocktail, jello, and ate tomato soup. Everything continued to cause diarrhea. Patient does not like ONS  Unable to complete Nutrition-Focused physical exam at this time.   Labs and medications reviewed: CBGs acceptable NS @ 151mL/hr  Diet Order:  Diet NPO time specified Except for: Ice Chips, Sips with Meds  Skin:  Reviewed, no issues  Last BM:  01/15/2016  Height:   Ht Readings from Last 1 Encounters:  01/15/16 5\' 11"  (1.803 m)    Weight:   Wt Readings from Last 1 Encounters:  01/15/16 203 lb 8 oz (92.3 kg)    Ideal Body Weight:  78.18 kg  BMI:  Body mass index is 28.38 kg/m.  Estimated Nutritional Needs:   Kcal:  1845-2306  calories  Protein:  120-140 gm  Fluid:  >/= 2L  EDUCATION NEEDS:   No education needs identified at this time  Satira Anis. Chanah Tidmore, MS, RD LDN Inpatient Clinical Dietitian Pager [phone]

## 2016-01-16 NOTE — Progress Notes (Signed)
Dr. Ether Griffins notified patient is scheduled for PET Scan today around lunch. Blood sugar 126 currently. Verbal order read back and verified to continue current IVF's, D/C Sodium Bicarb w/ Dextrose ordered, and make patient NPO.   Patient updated on plan of care for today.

## 2016-01-16 NOTE — Evaluation (Signed)
Physical Therapy Evaluation Patient Details Name: Hayden Martin MRN: [id number] DOB: 19-Dec-1931 Today's Date: 01/16/2016   History of Present Illness  Hayden Martin  is a 82 y.o. male with a known history of  DVT, diabetes, hypertension who is referred from oncology office due to worsening renal failure, elevated LDH level, and elevated uric acid level who was seen by oncology first time in November 13. Patient previously was noted to have weight loss and abnormal blood work and abdominal CT revealed multiple solid masses within the spleen as well as extensive lymphadenopathy. Patient was planned to have a PET scan and a biopsy of this. Who went for follow-up and had repeated blood work which showed his has elevated creatinine and his uric acid level is high and LDH is high as well. Therefore he is admitted for further evaluation and therapy. Patient complains of feeling very weak and tired and has not been able to walk.  Clinical Impression  Pt admitted with above diagnosis. Pt currently with functional limitations due to the deficits listed below (see PT Problem List).  Pt is generally weak with functional mobility. He requires minA+1 for bed mobility and transfers. Poor balance with sit to stand transfers demonstrating poor anterior weight shifting and posterior LOB. Once upright pt able to stabilize with rolling walker. He is able to ambulate in hallway but with decreased speed and step length. Poor scanning of visual environment with fatigue reported. SaO2 remains 91-92% on room air with ambulation. Per patient and family current mobility is declined from his baseline. Pt has good support from family but does not have 24/7 assist at home. At this time pt is a high fall risk and will need SNF placement to return to prior level of function. Pt will benefit from skilled PT services to address deficits in strength, balance, and mobility in order to return to full function at home.     Follow Up  Recommendations SNF    Equipment Recommendations  None recommended by PT    Recommendations for Other Services       Precautions / Restrictions Precautions Precautions: Fall Restrictions Weight Bearing Restrictions: No      Mobility  Bed Mobility Overal bed mobility: Needs Assistance Bed Mobility: Supine to Sit     Supine to sit: Min assist     General bed mobility comments: Pt requires minA+1 to go from L sidelying to sitting upright at EOB. Heavy use of bed rails and HOB elevated during bed mobility. Requires increased time and effort to perform. Increased time required to scoot forward toward EOB  Transfers Overall transfer level: Needs assistance Equipment used: Rolling walker (2 wheeled) Transfers: Sit to/from Stand Sit to Stand: Min assist         General transfer comment: Pt requires minA+1 due to posterior LOB during sit to stand transfers. Attempts to pull up on walker and requires cues to push off from sitting surface. Once upright pt able to stabilize with UE support on rolling walker  Ambulation/Gait Ambulation/Gait assistance: Min guard Ambulation Distance (Feet): 150 Feet Assistive device: Rolling walker (2 wheeled) Gait Pattern/deviations: Decreased step length - right;Decreased step length - left Gait velocity: Decreased Gait velocity interpretation: <1.8 ft/sec, indicative of risk for recurrent falls General Gait Details: Pt demonstrates decreased step length and gait speed. Reports increased fatigue during ambulation and gait speed is functional for limited household ambulation. Decreased scanning of visual environment. SaO2 91-92% during ambulation on room air. Pt reports decline from baseline mobility  Stairs            Wheelchair Mobility    Modified Rankin (Stroke Patients Only)       Balance Overall balance assessment: Needs assistance Sitting-balance support: No upper extremity supported Sitting balance-Leahy Scale: Fair      Standing balance support: No upper extremity supported Standing balance-Leahy Scale: Fair Standing balance comment: Fair in wide stance but generally prefers support on rolling walker. Pt demonstrates difficulty with balance during transfers and turns                             Pertinent Vitals/Pain Pain Assessment: No/denies pain    Home Living Family/patient expects to be discharged to:: Private residence Living Arrangements: Alone Available Help at Discharge: Family Type of Home: House Home Access: Stairs to enter Entrance Stairs-Rails: None Entrance Stairs-Number of Steps: 4 Home Layout: One level Home Equipment: Environmental consultant - 2 wheels;Cane - single point;Other (comment);Wheelchair - manual (lift chair, no grab bars)      Prior Function Level of Independence: Needs assistance   Gait / Transfers Assistance Needed: Independent for ambulation limited community distances without assistive device. No falls in the last 12 months  ADL's / Homemaking Assistance Needed: Independent with ADLs. Assist from family with IADLs        Hand Dominance   Dominant Hand: Right    Extremity/Trunk Assessment   Upper Extremity Assessment: Overall WFL for tasks assessed           Lower Extremity Assessment: Generalized weakness         Communication   Communication: HOH  Cognition Arousal/Alertness: Awake/alert Behavior During Therapy: WFL for tasks assessed/performed Overall Cognitive Status: Within Functional Limits for tasks assessed                      General Comments      Exercises     Assessment/Plan    PT Assessment Patient needs continued PT services  PT Problem List Decreased strength;Decreased activity tolerance;Decreased balance;Decreased mobility;Decreased safety awareness          PT Treatment Interventions DME instruction;Gait training;Functional mobility training;Stair training;Therapeutic activities;Therapeutic exercise;Balance  training;Neuromuscular re-education;Patient/family education    PT Goals (Current goals can be found in the Care Plan section)  Acute Rehab PT Goals Patient Stated Goal: Return to prior level of function at home PT Goal Formulation: With patient/family Time For Goal Achievement: 01/30/16 Potential to Achieve Goals: Good    Frequency Min 2X/week   Barriers to discharge Decreased caregiver support Pt has intermittent assist from family but not 24/7. At home alone throughout the day    Co-evaluation               End of Session Equipment Utilized During Treatment: Gait belt Activity Tolerance: Patient limited by fatigue Patient left: in chair;with call bell/phone within reach;with chair alarm set;with family/visitor present           Time: 1610-1640 PT Time Calculation (min) (ACUTE ONLY): 30 min   Charges:   PT Evaluation $PT Eval Low Complexity: 1 Procedure PT Treatments $Gait Training: 8-22 mins   PT G Codes:       Lyndel Safe Sidnee Gambrill PT, DPT   Naleah Kofoed 01/16/2016, 5:04 PM

## 2016-01-16 NOTE — Progress Notes (Signed)
[facility]  ROUNDING NOTE   Subjective:   Son at bedside.  IVF: NS at 172m/hr  PET for today.   Creatinine 1.92 (2.34)  Objective:  Vital signs in last 24 hours:  Temp:  [97.7 F (36.5 C)-98.2 F (36.8 C)] 98.2 F (36.8 C) (11/15 0517) Pulse Rate:  [77-94] 77 (11/15 0517) Resp:  [18] 18 (11/15 0517) BP: (123-136)/(56-63) 136/56 (11/15 0517) SpO2:  [94 %-96 %] 96 % (11/15 0517) Weight:  [92.3 kg (203 lb 8 oz)] 92.3 kg (203 lb 8 oz) (11/14 1613)  Weight change:  Filed Weights   01/15/16 1430 01/15/16 1533 01/15/16 1613  Weight: 92.3 kg (203 lb 8 oz) 92.3 kg (203 lb 8 oz) 92.3 kg (203 lb 8 oz)    Intake/Output: I/O last 3 completed shifts: In: 1972.5 [P.O.:240; I.V.:1732.5] Out: -    Intake/Output this shift:  No intake/output data recorded.  Physical Exam: General: NAD,   Head: Normocephalic, atraumatic. Moist oral mucosal membranes  Eyes: Anicteric, PERRL  Neck: Supple, trachea midline  Lungs:  Clear to auscultation  Heart: Regular rate and rhythm  Abdomen:  Soft, nontender,   Extremities:  left extremity with 1+ edema, no right lower extremity edema  Neurologic: Nonfocal, moving all four extremities  Skin: No lesions       Basic Metabolic Panel:  Recent Labs Lab 01/14/16 1619 01/16/16 0445  NA 133* 136  K 5.5* 5.1  CL 102 109  CO2 22 20*  GLUCOSE 198* 145*  BUN 46* 34*  CREATININE 2.34* 1.92*  CALCIUM 8.9 8.0*    Liver Function Tests:  Recent Labs Lab 01/14/16 1619  AST 37  ALT 29  ALKPHOS 47  BILITOT 0.9  PROT 6.7  ALBUMIN 3.5   No results for input(s): LIPASE, AMYLASE in the last 168 hours. No results for input(s): AMMONIA in the last 168 hours.  CBC:  Recent Labs Lab 01/14/16 1619 01/16/16 0445  WBC 6.2 4.3  NEUTROABS 4.4  --   HGB 15.6 13.5  HCT 48.1 41.5  MCV 77.8* 79.5*  PLT 99* 92*    Cardiac Enzymes: No results for input(s): CKTOTAL, CKMB, CKMBINDEX, TROPONINI in the last 168  hours.  BNP: Invalid input(s): POCBNP  CBG:  Recent Labs Lab 01/15/16 2141 01/16/16 0743 01/16/16 1119  GLUCAP 148* 126* 143*    Microbiology: No results found for this or any previous visit.  Coagulation Studies: No results for input(s): LABPROT, INR in the last 72 hours.  Urinalysis: No results for input(s): COLORURINE, LABSPEC, PHURINE, GLUCOSEU, HGBUR, BILIRUBINUR, KETONESUR, PROTEINUR, UROBILINOGEN, NITRITE, LEUKOCYTESUR in the last 72 hours.  Invalid input(s): APPERANCEUR    Imaging: [place]al  Result Date: 01/15/2016 CLINICAL DATA:  Acute renal failure syndrome, chronic renal disease stage III EXAM: RENAL / URINARY TRACT ULTRASOUND COMPLETE COMPARISON:  CT from 01/07/2016 FINDINGS: Right Kidney: Length: 11.1 cm. Echogenicity within normal limits. No mass or hydronephrosis visualized. Left Kidney: Length: 11.7 cm. Echogenicity within normal limits. No mass or hydronephrosis visualized. Bladder: The bladder is contracted which may account the mild thickened appearance of the bladder wall up to 5 mm. No calculus or focal mass. Incidental note is made of postop megaly with the prostate measuring approximately 9.1 x 7.7 x 7.3 cm in craniocaudad by AP by transverse dimension. Heterogeneous ill-defined masses of the spleen mass seen on CT. The spleen measures approximately 15 x 8.4 cm. IMPRESSION: Kidneys and bladder are without acute appearing abnormality. Heterogeneous solid masslike abnormalities of the large  spleen as noted on recent CT may reflect lymphoma/leukemia. There is prostatomegaly as well. Electronically Signed   By: Ashley Royalty M.D.   On: 01/15/2016 17:45   US Venous Img Lower Bilateral  Result Date: 01/16/2016 CLINICAL DATA:  Leg swelling for 2 weeks. History of left popliteal DVT in 2014. EXAM: BILATERAL LOWER EXTREMITY VENOUS DOPPLER ULTRASOUND TECHNIQUE: Gray-scale sonography with graded compression, as well as color Doppler and duplex ultrasound were performed  to evaluate the lower extremity deep venous systems from the level of the common femoral vein and including the common femoral, femoral, profunda femoral, popliteal and calf veins including the posterior tibial, peroneal and gastrocnemius veins when visible. The superficial great saphenous vein was also interrogated. Spectral Doppler was utilized to evaluate flow at rest and with distal augmentation maneuvers in the common femoral, femoral and popliteal veins. COMPARISON:  12/15/2012 FINDINGS: RIGHT LOWER EXTREMITY Normal compressibility, augmentation and color Doppler flow in the right common femoral vein, right femoral vein and right popliteal vein. The right saphenofemoral junction is patent. Right profunda femoral vein is patent without thrombus. Visualized right deep calf veins are patent without thrombus. LEFT LOWER EXTREMITY Normal compressibility and color Doppler flow in the left common femoral vein. The left saphenofemoral junction is patent. Left profunda femoral vein is patent without thrombus. Normal compression and color Doppler flow in the left femoral vein. Partial compressibility of the proximal left popliteal vein with nonocclusive thrombus. The distal left popliteal vein is not compressible with occlusive thrombus. There appears to be thrombus within the left peroneal and posterior tibial veins. IMPRESSION: Positive for deep venous thrombosis in the left lower extremity. There is thrombus involving the left calf veins and the left popliteal vein. The clot burden in the left popliteal vein is greater than it was in 2014 suggesting that this is acute thrombus rather than just chronic thrombus. Negative for deep venous thrombosis in right lower extremity. Electronically Signed   By: Markus Daft M.D.   On: 01/16/2016 07:49     Medications:   . sodium chloride 100 mL/hr at 01/16/16 1149   . allopurinol  300 mg Oral Daily  . apixaban  5 mg Oral BID  . aspirin  325 mg Oral Daily  . carvedilol   6.25 mg Oral BID WC  . insulin aspart  0-9 Units Subcutaneous TID WC  . insulin glargine  15 Units Subcutaneous QHS  . ketoconazole   Topical Daily  . pneumococcal 23 valent vaccine  0.5 mL Intramuscular Tomorrow-1000  . sodium bicarbonate  650 mg Oral BID   acetaminophen **OR** acetaminophen, HYDROcodone-acetaminophen, ondansetron **OR** ondansetron (ZOFRAN) IV  Assessment/ Plan:  [name] is a 82 y.o. white male with new onset lymphoma, hypertension, diabetes mellitus type II, hyperlipidemia, coronary artery disease, DVT on xarelto, BPH, RBB, who was admitted to [facility] on 01/15/2016 for tumor lysis syndrome renal insufficiency hyperuremia   1. Acute renal failure on chronic kidney disease stage III: with hyperkalemia and hyponatremia: baseline creatinine of 1.6, eGFR of 42 from 12/31/15.  CT angiogram on 11/6. Poor PO intake: albumin low at 3.5  2. Tumor lysis syndrome with hyperuremia - G6PD deficiency pending - Agree with IV fluids: change to NS at 150m/hr - Discussed role of dialysis in acute renal failure with patient and family.  3. Metabolic acidosis: from NS - started on sodium bicarbonate PO   LOS: 1[place] SSchuyler11/15/201712:45 PM

## 2016-01-16 NOTE — Progress Notes (Signed)
[facility] [place] NAME: Hayden Martin    MR#:  [id number]  DATE OF BIRTH:  December 01, 1931  SUBJECTIVE:  CHIEF COMPLAINT:  No chief complaint on file. The patient is a 82 year old Caucasian male with past medical history significant for history of suspected lymphoma, DVT, diabetes, essential hypertension, who presents to the hospital with complaints of elevated LDH, worsening renal failure, elevated uric acid level. He was admitted from oncology office as directed. Admit. He was complaining of feeling weak and tired and not being able to walk. Labs revealed creatinine of 2.34, hyperkalemia, potassium of 5.5, hyponatremia, sodium 133, elevated uric acid level to 12.7, patient was rehydrated, his creatinine improved to 1.92. Today. The patient was seen by oncologist and nephrologist, recommended to continue IV fluids, discussed the role of dialysis and acute renal failure this patient and family. He was initiated on her sodium bicarbonate because of acidosis. Oncologist is planning PET scan to determine the site of biopsy.  Review of Systems  Constitutional: Negative for chills, fever and weight loss.  HENT: Negative for congestion.   Eyes: Negative for blurred vision and double vision.  Respiratory: Negative for cough, sputum production, shortness of breath and wheezing.   Cardiovascular: Negative for chest pain, palpitations, orthopnea, leg swelling and PND.  Gastrointestinal: Negative for abdominal pain, blood in stool, constipation, diarrhea, nausea and vomiting.  Genitourinary: Negative for dysuria, frequency, hematuria and urgency.  Musculoskeletal: Negative for falls.  Neurological: Positive for weakness. Negative for dizziness, tremors, focal weakness and headaches.  Endo/Heme/Allergies: Does not bruise/bleed easily.  Psychiatric/Behavioral: Negative for depression. The patient does not have insomnia.     VITAL SIGNS: Blood pressure (!)  136/56, pulse 77, temperature 98.2 F (36.8 C), temperature source Oral, resp. rate 18, height 5\' 11"  (1.803 m), weight 92.3 kg (203 lb 8 oz), SpO2 96 %.  PHYSICAL EXAMINATION:   GENERAL:  82 y.o.-year-old patient lying in the bed with no acute distress. Pale and weak, chronically ill appearance, oral mucosa is somewhat dry EYES: Pupils equal, round, reactive to light and accommodation. No scleral icterus. Extraocular muscles intact.  HEENT: Head atraumatic, normocephalic. Oropharynx and nasopharynx clear.  NECK:  Supple, no jugular venous distention. No thyroid enlargement, no tenderness.  LUNGS: Normal breath sounds bilaterally, no wheezing, rales,rhonchi or crepitation. No use of accessory muscles of respiration.  CARDIOVASCULAR: S1, S2 normal. No murmurs, rubs, or gallops.  ABDOMEN: Soft, nontender, nondistended. Bowel sounds present. No organomegaly or mass.  EXTREMITIES: No pedal edema, cyanosis, or clubbing. Left lower extremity edema below the knee NEUROLOGIC: Cranial nerves II through XII are intact. Muscle strength 5/5 in all extremities. Sensation intact. Gait not checked.  PSYCHIATRIC: The patient is alert and oriented x 3.  SKIN: No obvious rash, lesion, or ulcer.   ORDERS/RESULTS REVIEWED:   CBC  Recent Labs Lab 01/14/16 1619 01/16/16 0445  WBC 6.2 4.3  HGB 15.6 13.5  HCT 48.1 41.5  PLT 99* 92*  MCV 77.8* 79.5*  MCH 25.2* 25.9*  MCHC 32.4 32.5  RDW 19.0* 19.6*  LYMPHSABS 0.7*  --   MONOABS 0.8  --   EOSABS 0.2  --   BASOSABS 0.1  --    ------------------------------------------------------------------------------------------------------------------  Chemistries   Recent Labs Lab 01/14/16 1619 01/16/16 0445  NA 133* 136  K 5.5* 5.1  CL 102 109  CO2 22 20*  GLUCOSE 198* 145*  BUN 46* 34*  CREATININE 2.34* 1.92*  CALCIUM 8.9 8.0*  AST  37  --   ALT 29  --   ALKPHOS 47  --   BILITOT 0.9  --     ------------------------------------------------------------------------------------------------------------------ estimated creatinine clearance is 34.4 mL/min (by C-G formula based on SCr of 1.92 mg/dL (H)). ------------------------------------------------------------------------------------------------------------------ No results for input(s): TSH, T4TOTAL, T3FREE, THYROIDAB in the last 72 hours.  Invalid input(s): FREET3  Cardiac Enzymes No results for input(s): CKMB, TROPONINI, MYOGLOBIN in the last 168 hours.  Invalid input(s): CK ------------------------------------------------------------------------------------------------------------------ Invalid input(s): POCBNP ---------------------------------------------------------------------------------------------------------------  RADIOLOGY: US Renal  Result Date: 01/15/2016 CLINICAL DATA:  Acute renal failure syndrome, chronic renal disease stage III EXAM: RENAL / URINARY TRACT ULTRASOUND COMPLETE COMPARISON:  CT from 01/07/2016 FINDINGS: Right Kidney: Length: 11.1 cm. Echogenicity within normal limits. No mass or hydronephrosis visualized. Left Kidney: Length: 11.7 cm. Echogenicity within normal limits. No mass or hydronephrosis visualized. Bladder: The bladder is contracted which may account the mild thickened appearance of the bladder wall up to 5 mm. No calculus or focal mass. Incidental note is made of postop megaly with the prostate measuring approximately 9.1 x 7.7 x 7.3 cm in craniocaudad by AP by transverse dimension. Heterogeneous ill-defined masses of the spleen mass seen on CT. The spleen measures approximately 15 x 8.4 cm. IMPRESSION: Kidneys and bladder are without acute appearing abnormality. Heterogeneous solid masslike abnormalities of the large spleen as noted on recent CT may reflect lymphoma/leukemia. There is prostatomegaly as well. Electronically Signed   By: Ashley Royalty M.D.   On: 01/15/2016 17:45   US Venous Img  Lower Bilateral  Result Date: 01/16/2016 CLINICAL DATA:  Leg swelling for 2 weeks. History of left popliteal DVT in 2014. EXAM: BILATERAL LOWER EXTREMITY VENOUS DOPPLER ULTRASOUND TECHNIQUE: Gray-scale sonography with graded compression, as well as color Doppler and duplex ultrasound were performed to evaluate the lower extremity deep venous systems from the level of the common femoral vein and including the common femoral, femoral, profunda femoral, popliteal and calf veins including the posterior tibial, peroneal and gastrocnemius veins when visible. The superficial great saphenous vein was also interrogated. Spectral Doppler was utilized to evaluate flow at rest and with distal augmentation maneuvers in the common femoral, femoral and popliteal veins. COMPARISON:  12/15/2012 FINDINGS: RIGHT LOWER EXTREMITY Normal compressibility, augmentation and color Doppler flow in the right common femoral vein, right femoral vein and right popliteal vein. The right saphenofemoral junction is patent. Right profunda femoral vein is patent without thrombus. Visualized right deep calf veins are patent without thrombus. LEFT LOWER EXTREMITY Normal compressibility and color Doppler flow in the left common femoral vein. The left saphenofemoral junction is patent. Left profunda femoral vein is patent without thrombus. Normal compression and color Doppler flow in the left femoral vein. Partial compressibility of the proximal left popliteal vein with nonocclusive thrombus. The distal left popliteal vein is not compressible with occlusive thrombus. There appears to be thrombus within the left peroneal and posterior tibial veins. IMPRESSION: Positive for deep venous thrombosis in the left lower extremity. There is thrombus involving the left calf veins and the left popliteal vein. The clot burden in the left popliteal vein is greater than it was in 2014 suggesting that this is acute thrombus rather than just chronic thrombus. Negative  for deep venous thrombosis in right lower extremity. Electronically Signed   By: Markus Daft M.D.   On: 01/16/2016 07:49    EKG:  Orders placed or performed in visit on 08/27/06  . EKG 12-Lead    ASSESSMENT AND PLAN:  Active Problems:  Tumor lysis syndrome #1. Acute renal failure due to tumor lysis syndrome, continue IV fluids, appreciate nephrologist input, kidney function is improving, follow creatinine in the morning #2 metabolic acidosis, initiate patient on bicarbonate, follow bicarbonate level in the morning #3. Hyperkalemia, improved with hydration, follow in the morning, continue bicarbonate #4. Hyponatremia, improved with IV hydration #5, hyperuricemia, recheck labs tomorrow morning. Continue IV hydration #6. Suspected lymphoma, PET scan is going to be done today to determine the site of biopsy for pathology   Management plans discussed with the patient, family and they are in agreement.   DRUG ALLERGIES:  Allergies  Allergen Reactions  . Codeine Itching  . Penicillins Itching    CODE STATUS:     Code Status Orders        Start     Ordered   01/15/16 1512  Full code  Continuous     01/15/16 1511    Code Status History    Date Active Date Inactive Code Status Order ID Comments User Context   This patient has a current code status but no historical code status.    Advance Directive Documentation   [place] Most Recent Value  Type of Advance Directive  Living will  Pre-existing out of facility DNR order (yellow form or pink MOST form)  No data  "MOST" Form in Place?  No data      TOTAL TIME TAKING CARE OF THIS PATIENT: 40 minutes.   Discussed extensively with patient's family Ishmael Berkovich M.D on 01/16/2016 at 1:00 PM  Between 7am to 6pm - Pager - [phone]  After 6pm go to [email or web address] - password [email or web address] [facility]  Office  [phone]  CC: Primary care physician; Kirk Ruths., MD

## 2016-01-16 NOTE — Consult Note (Signed)
[facility]  Date of admission:  01/15/2016  Inpatient day:  01/15/2016  Consulting physician: Dr. Dustin Flock   Reason for Consultation:  Cancer.  Chief Complaint: Hayden Martin is a 82 y.o. 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  HPI:  The patient has had a 30-35 pound weight loss in the past 2-3 months.  Abdomen and pelvic CT scan on 01/07/2016 revealed multiple solid masses within the spleen.  There was extensive retroperitoneal, portal and mesenteric lymphadenopathy.  Retrocrural node measured 2.1 x 2.0 cm.  Periportal node measured 2.6 x 5.8 cm. Confluent nodal mass is in the retroperitoneum surrounding the great vessels measured 5.6 x 3.8 cmon the left and 5.3 x 2.9 cm on the right.  Left iliac node measured 1.7 x 2.6 cm.  There was possible pleural disease on the left.  The prostate was markedly enlarged.  There was no disease in the liver.  He was seen in consultation in the oncology clinic on 01/14/2016.  We discussed the plan for outpatient PET scan to assess disease and determine site for biopsy.  Labs were drawn.  CBC revealed a platelet count of 99,000.  CMP revealed a potassium of 5.5, BUN 46, and creatinine 2.34 (baseline 1.3-1.4).  LDH was 709.  Uric acid was 12.7.  PSA was 6.97.  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  He a history of left lower extremity thrombosis.  He has had 2 clots 1 year apart and is on life long anticoagulation.  He is on Xarelto.  He comments that he misses some doses.  He had left lower extremity edema.  Lower extremity duplex was ordered.   Past Medical History:  Diagnosis Date  . Clotting disorder ([place])   . Diabetes mellitus without complication ([place])   . Heart disease   . Hypertension     Past Surgical History:  Procedure Laterality Date  . HERNIA REPAIR      History reviewed. No  pertinent family history.  Social History:  reports that he quit smoking about 30 years ago. He smoked 1.50 packs per day. He has quit using smokeless tobacco. His alcohol and drug histories are not on file.  He was in the [employer].  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  He has 3 children who live locally.  The patient was brought to clinic by his son, [name].  Allergies:  Allergies  Allergen Reactions  . Codeine Itching  . Penicillins Itching    Medications Prior to Admission  Medication Sig Dispense Refill  . rivaroxaban (XARELTO) 20 MG TABS tablet Take by mouth.    [name] aspirin (GOODSENSE ASPIRIN) 325 MG tablet Take by mouth.    [name] atorvastatin (LIPITOR) 20 MG tablet Take 20 mg by mouth daily.     . carvedilol (COREG) 6.25 MG tablet TAKE ONE (1) TABLET BY MOUTH TWO (2) TIMES DAILY    . insulin aspart (NOVOLOG) 100 UNIT/ML FlexPen Inject 8 Units into the skin 3 (three) times daily with meals.     . Insulin Glargine (LANTUS [place]) Inject into the skin. Inject 35 units subcutaneously at bedtime.    [name] ketoconazole (NIZORAL) 2 % cream APPLY AT BEDTIME DAILY TO AFFECTED AREAS    . lisinopril-hydrochlorothiazide (PRINZIDE,ZESTORETIC) 20-25 MG tablet TAKE ONE (1) TABLET BY MOUTH EVERY DAY      Review of  Systems: GENERAL:  No energy.  Rests most of day.  No fevers or sweats.  Weight loss of 30-35 pounds in 2-3 months. PERFORMANCE STATUS (ECOG):  2 HEENT:  No visual changes, runny nose, sore throat, mouth sores or tenderness. Lungs: Shortness of breath on exertion.  Little non-productive cough.  No hemoptysis. Cardiac:  No chest pain, palpitations, orthopnea, or PND. GI:  No appetite.  Two "spells of nausea".   Black stools x 2-3 months.  No vomiting, diarrhea, constipation, or hematochezia. GU:  Decreased urination.  No urgency, frequency, dysuria, or hematuria. Musculoskeletal:  Back issues.  Joints hurt.  No muscle tenderness. Extremities:  No pain or swelling. Skin:   Pruritus.  No rashes or skin changes. Neuro:  No headache, numbness or weakness, or coordination issues.  Balance issues. Endocrine:  Diabetes.  No thyroid issues, hot flashes or night sweats. Psych:  No mood changes, depression or anxiety. Pain:  No focal pain. Review of systems:  All other systems reviewed and found   Physical Exam:  Blood pressure (!) 123/56, pulse 88, temperature 97.9 F (36.6 C), temperature source Oral, resp. rate 18, height 5\' 11"  (1.803 m), weight 203 lb 8 oz (92.3 kg), SpO2 94 %.  Physical exam performed on 01/14/2016 GENERAL:  Elderly gentleman sitting comfortably in a wheelchair in the exam room in no acute distress.  He needed assistance onto the exam table. MENTAL STATUS:  Alert and oriented to person, place and time. HEAD:  Wearing an Scientist, clinical (histocompatibility and immunogenetics).  Gray hair.  Male pattern baldness.  Normocephalic, atraumatic, face symmetric, no Cushingoid features. EYES:  Blue eyes.  Pupils equal round and reactive to light and accomodation.  No conjunctivitis or scleral icterus. ENT:  Hearing aide.  Oropharynx clear without lesion.  Tongue normal. Mucous membranes moist.  RESPIRATORY:  Decreased breath sounds left base.  Clear to auscultation without rales, wheezes or rhonchi. CARDIOVASCULAR:  Regular rate and rhythm without murmur, rub or gallop. ABDOMEN:  Soft, non-tender, with active bowel sounds, and no hepatomegaly.  Left upper quadrant fullness.  No masses. SKIN:  No rashes, ulcers or lesions. EXTREMITIES:  Left lower extremity edema below the knee.  No skin discoloration or tenderness.  No palpable cords. LYMPH NODES: No palpable cervical, supraclavicular, axillary or inguinal adenopathy  NEUROLOGICAL: Unremarkable. PSYCH:  Appropriate.   Results for orders placed or performed during the hospital encounter of 01/15/16 (from the past 48 hour(s))  Glucose, capillary     Status: Abnormal   Collection Time: 01/15/16  9:41 PM  Result Value Ref Range    Glucose-Capillary 148 (H) 65 - 99 mg/dL   US Renal  Result Date: 01/15/2016 CLINICAL DATA:  Acute renal failure syndrome, chronic renal disease stage III EXAM: RENAL / URINARY TRACT ULTRASOUND COMPLETE COMPARISON:  CT from 01/07/2016 FINDINGS: Right Kidney: Length: 11.1 cm. Echogenicity within normal limits. No mass or hydronephrosis visualized. Left Kidney: Length: 11.7 cm. Echogenicity within normal limits. No mass or hydronephrosis visualized. Bladder: The bladder is contracted which may account the mild thickened appearance of the bladder wall up to 5 mm. No calculus or focal mass. Incidental note is made of postop megaly with the prostate measuring approximately 9.1 x 7.7 x 7.3 cm in craniocaudad by AP by transverse dimension. Heterogeneous ill-defined masses of the spleen mass seen on CT. The spleen measures approximately 15 x 8.4 cm. IMPRESSION: Kidneys and bladder are without acute appearing abnormality. Heterogeneous solid masslike abnormalities of the large spleen as noted on recent  CT may reflect lymphoma/leukemia. There is prostatomegaly as well. Electronically Signed   By: Ashley Royalty M.D.   On: 01/15/2016 17:45    Assessment:  The patient is a 82 y.o.  gentleman with with probable lymphoma and tumor lysis syndrome presenting with a 30-35 pound weight loss in 2-3 months.  Abdomen and pelvic CT scan on 01/07/2016 revealed multiple solid masses within the spleen.  There was extensive retroperitoneal, portal and mesenteric lymphadenopathy.  The prostate was markedly enlarged.   He has a history of left lower extremity thrombosis.  He has had 2 clots 1 year apart and is on life long anticoagulation.  He is on Xarelto.    He has acute on chronic renal insufficiency secondary to tumor lysis.  Baseline creatinine is 1.3- 1.4.  Creatinine is 2.34.  Uric acid is 12.7.  He has an enlarged prostate and an elevated PSA.  Symptomatically, he is fatigued.  He has had significant weight loss.  He  denies fevers and sweats.  Exam reveals no palpable adenopathy.  Plan:   1.  Oncology:  Discuss plan to expedite evaluation and treatment.  Plan for inpatient PET scan to determine best site for biopsy.  Patient has no palpable adenopathy.  Patient will likely also need bone marrow aspirate and biopsy for staging once diagnosis is confirmed.  Hepatitis serologies sent secondary to plan for Rituxan (can reactive hepatitis).  Patient will receive inpatient chemotherapy.  2.  Hematology:  Thrombocytopenia felt secondary to splenomegaly and possibly marrow disease.  Patient has history of left lower extremity DVT on Xarelto.  Duplex ordered to ensure no active clot.  Pharmacy has been consulted secondary to renal insufficiency.  Will need to hold anticoagulation in anticipation of biopsy.  3.  Nephrology:  Tumor lysis syndrome.  Continue allopurinol.  Monitor BMP, uric acid, phosphorus.  Await results of G6PD testing (ensure negative prior to administration of rasburicase if needed).  Consult nephrology.  Thank you for allowing me to participate in RURY NISBETT 's care.  I will follow him closely with you while hospitalized and after discharge in the outpatient department.   Lequita Asal, MD  01/15/2016

## 2016-01-17 ENCOUNTER — Ambulatory Visit: Admission: RE | Admit: 2016-01-17 | Payer: Medicare Other | Source: Ambulatory Visit

## 2016-01-17 LAB — CBC
HEMATOCRIT: 38.5 % — AB (ref 40.0–52.0)
Hemoglobin: 12.6 g/dL — ABNORMAL LOW (ref 13.0–18.0)
MCH: 25.7 pg — AB (ref 26.0–34.0)
MCHC: 32.7 g/dL (ref 32.0–36.0)
MCV: 78.4 fL — ABNORMAL LOW (ref 80.0–100.0)
Platelets: 87 10*3/uL — ABNORMAL LOW (ref 150–440)
RBC: 4.91 MIL/uL (ref 4.40–5.90)
RDW: 20.1 % — AB (ref 11.5–14.5)
WBC: 3.8 10*3/uL (ref 3.8–10.6)

## 2016-01-17 LAB — BASIC METABOLIC PANEL
ANION GAP: 6 (ref 5–15)
BUN: 27 mg/dL — AB (ref 6–20)
CALCIUM: 7.8 mg/dL — AB (ref 8.9–10.3)
CO2: 20 mmol/L — ABNORMAL LOW (ref 22–32)
Chloride: 110 mmol/L (ref 101–111)
Creatinine, Ser: 1.68 mg/dL — ABNORMAL HIGH (ref 0.61–1.24)
GFR calc Af Amer: 42 mL/min — ABNORMAL LOW (ref >60)
GFR, EST NON AFRICAN AMERICAN: 36 mL/min — AB (ref >60)
Glucose, Bld: 110 mg/dL — ABNORMAL HIGH (ref 65–99)
POTASSIUM: 4.5 mmol/L (ref 3.5–5.1)
SODIUM: 136 mmol/L (ref 135–145)

## 2016-01-17 LAB — HEPATITIS B SURFACE ANTIGEN: Hepatitis B Surface Ag: NEGATIVE

## 2016-01-17 LAB — GLUCOSE, CAPILLARY
GLUCOSE-CAPILLARY: 94 mg/dL (ref 65–99)
GLUCOSE-CAPILLARY: 113 mg/dL — AB (ref 65–99)
GLUCOSE-CAPILLARY: 104 mg/dL — AB (ref 65–99)
GLUCOSE-CAPILLARY: 110 mg/dL — AB (ref 65–99)

## 2016-01-17 LAB — APTT
APTT: 137 s — AB (ref 24–36)
aPTT: 34 seconds (ref 24–36)

## 2016-01-17 LAB — HEPATITIS B CORE ANTIBODY, TOTAL: Hep B Core Total Ab: NEGATIVE

## 2016-01-17 LAB — HEPARIN LEVEL (UNFRACTIONATED): Heparin Unfractionated: 1.82 IU/mL — ABNORMAL HIGH (ref 0.30–0.70)

## 2016-01-17 LAB — PLATELET COUNT: Platelets: 89 10*3/uL — ABNORMAL LOW (ref 150–440)

## 2016-01-17 LAB — PHOSPHORUS: Phosphorus: 2.4 mg/dL — ABNORMAL LOW (ref 2.5–4.6)

## 2016-01-17 LAB — MAGNESIUM: MAGNESIUM: 1.5 mg/dL — AB (ref 1.7–2.4)

## 2016-01-17 LAB — PROTIME-INR
INR: 1.66
Prothrombin Time: 19.8 seconds — ABNORMAL HIGH (ref 11.4–15.2)

## 2016-01-17 LAB — URIC ACID: URIC ACID, SERUM: 8.8 mg/dL — AB (ref 4.4–7.6)

## 2016-01-17 LAB — HEPATITIS C ANTIBODY: HCV Ab: <0.1 s/co ratio (ref 0.0–0.9)

## 2016-01-17 MED ORDER — MAGNESIUM SULFATE 4 GM/100ML IV SOLN
4.0000 g | Freq: Once | INTRAVENOUS | Status: AC
  Administered 2016-01-17: 4 g via INTRAVENOUS
  Filled 2016-01-17: qty 100

## 2016-01-17 MED ORDER — HEPARIN (PORCINE) IN NACL 100-0.45 UNIT/ML-% IJ SOLN
1300.0000 [IU]/h | INTRAMUSCULAR | Status: DC
  Administered 2016-01-17: 1500 [IU]/h via INTRAVENOUS
  Administered 2016-01-18: 1300 [IU]/h via INTRAVENOUS
  Filled 2016-01-17 (×3): qty 250

## 2016-01-17 NOTE — Clinical Social Work Placement (Signed)
   CLINICAL SOCIAL WORK PLACEMENT  NOTE  Date:  01/17/2016  Patient Details  Name: Hayden Martin MRN: [id number] Date of Birth: 1931/05/21  Clinical Social Work is seeking post-discharge placement for this patient at the [place] level of care (*CSW will initial, date and re-position this form in  chart as items are completed):  Yes   Patient/family provided with [facility] Work Department's list of facilities offering this level of care within the geographic area requested by the patient (or if unable, by the patient's family).  Yes   Patient/family informed of their freedom to choose among providers that offer the needed level of care, that participate in Medicare, Medicaid or managed care program needed by the patient, have an available bed and are willing to accept the patient.  Yes   Patient/family informed of [place]'s ownership interest in [facility] and [facility], as well as of the fact that they are under no obligation to receive care at these facilities.  PASRR submitted to EDS on 01/17/16     PASRR number received on 01/17/16     Existing PASRR number confirmed on       FL2 transmitted to all facilities in geographic area requested by pt/family on 01/17/16     FL2 transmitted to all facilities within larger geographic area on       Patient informed that his/her managed care company has contracts with or will negotiate with certain facilities, including the following:            Patient/family informed of bed offers received.  Patient chooses bed at       Physician recommends and patient chooses bed at      Patient to be transferred to   on  .  Patient to be transferred to facility by       Patient family notified on   of transfer.  Name of family member notified:        PHYSICIAN Please sign FL2     Additional Comment:    _______________________________________________ Ross Ludwig 01/17/2016,  11:59 PM

## 2016-01-17 NOTE — Progress Notes (Signed)
ANTICOAGULATION CONSULT NOTE - Initial Consult  Pharmacy Consult for heparin drip Indication: DVT  Allergies  Allergen Reactions  . Codeine Itching  . Penicillins Itching    Patient Measurements: Height: 5\' 11"  (180.3 cm) Weight: 203 lb 8 oz (92.3 kg) IBW/kg (Calculated) : 75.3 Heparin Dosing Weight: 92 kg  Vital Signs: Temp: 98.4 F (36.9 C) (11/16 1301) Temp Source: Oral (11/16 1301) BP: 107/53 (11/16 1301) Pulse Rate: 78 (11/16 1301)  Labs:  Recent Labs  01/16/16 0445 01/17/16 0449 01/17/16 1030 01/17/16 1827  HGB 13.5  --  12.6*  --   HCT 41.5  --  38.5*  --   PLT 92* 89* 87*  --   APTT  --   --  34 137*  LABPROT  --   --  19.8*  --   INR  --   --  1.66  --   HEPARINUNFRC  --   --  1.82*  --   CREATININE 1.92* 1.68*  --   --     Estimated Creatinine Clearance: 39.4 mL/min (by C-G formula based on SCr of 1.68 mg/dL (H)).   Medical History: Past Medical History:  Diagnosis Date  . Clotting disorder ([place])   . Diabetes mellitus without complication ([place])   . Heart disease   . Hypertension    Medications:  Scheduled:  . allopurinol  300 mg Oral Daily  . carvedilol  6.25 mg Oral BID WC  . insulin aspart  0-9 Units Subcutaneous TID WC  . insulin glargine  15 Units Subcutaneous QHS  . ketoconazole   Topical Daily  . pneumococcal 23 valent vaccine  0.5 mL Intramuscular Tomorrow-1000  . sodium bicarbonate  650 mg Oral BID   Assessment: Pharmacy consulted to dose and monitor heparin drip in this 82 year old male admitted for tumor lysis syndrome and diagnosed with an acute lower extremity DVT.   Patient was taking rivaroxaban prior to admission for a history of DVT and was converted to apixaban on admission due to ARF and CrCl < 30 mL/min. Patient has received 2 doses of apixaban during admission - last dose at 2206 on 11/15.   Baseline labs (CBC, INR, APTT, and heparin level) have been ordered STAT.  Heparin drip will begin at 1000 this morning (12  hours after last apixaban dose) and will need to be monitored/adjusted using APTT since patient has been prescribed NOACs. Once APTT and heparin level correlate, can monitor using heparin level.  Goal of Therapy:  Heparin level 0.3-0.7 units/ml aPTT 66 - 102 seconds Monitor platelets by anticoagulation protocol: Yes   Plan:  11/16 APTT 137 - Will decrease heparin infusion to 1300 units/hr.  Check heparin level & APTT in 8 hours and daily while on heparin Continue to monitor H&H and platelets  Pernell Dupre, PharmD,  Clinical Pharmacist 01/17/2016,7:01 PM

## 2016-01-17 NOTE — Progress Notes (Signed)
[facility] [place] NAME: Hayden Martin    MR#:  [id number]  DATE OF BIRTH:  Dec 18, 1931  SUBJECTIVE:  CHIEF COMPLAINT:  No chief complaint on file. The patient is a 82 year old Caucasian male with past medical history significant for history of suspected lymphoma, DVT, diabetes, essential hypertension, who presents to the hospital with complaints of elevated LDH, worsening renal failure, elevated uric acid level. He was admitted from oncology office as directed. Admit. He was complaining of feeling weak and tired and not being able to walk. Labs revealed creatinine of 2.34, hyperkalemia, potassium of 5.5, hyponatremia, sodium 133, elevated uric acid level to 12.7, patient was rehydrated, his creatinine improved to 1.92. Today. The patient was seen by oncologist and nephrologist, recommended to continue IV fluids, discussed the role of dialysis and acute renal failure this patient and family. He was initiated on her sodium bicarbonate because of acidosis. PET scan revealed lymphadenopathy in neck, chest, abdomen and pelvis, enlarged spleen, consistent with clinical diagnosis is lymphoma. Patient feels satisfactory today, denies any discomfort. Physical therapist recommended skilled nursing facility placement  Review of Systems  Constitutional: Negative for chills, fever and weight loss.  HENT: Negative for congestion.   Eyes: Negative for blurred vision and double vision.  Respiratory: Negative for cough, sputum production, shortness of breath and wheezing.   Cardiovascular: Negative for chest pain, palpitations, orthopnea, leg swelling and PND.  Gastrointestinal: Negative for abdominal pain, blood in stool, constipation, diarrhea, nausea and vomiting.  Genitourinary: Negative for dysuria, frequency, hematuria and urgency.  Musculoskeletal: Negative for falls.  Neurological: Positive for weakness. Negative for dizziness, tremors, focal weakness  and headaches.  Endo/Heme/Allergies: Does not bruise/bleed easily.  Psychiatric/Behavioral: Negative for depression. The patient does not have insomnia.     VITAL SIGNS: Blood pressure (!) 107/53, pulse 78, temperature 98.4 F (36.9 C), temperature source Oral, resp. rate 19, height 5\' 11"  (1.803 m), weight 92.3 kg (203 lb 8 oz), SpO2 96 %.  PHYSICAL EXAMINATION:   GENERAL:  82 y.o.-year-old patient lying in the bed with no acute distress. Comfortable, in better mood  EYES: Pupils equal, round, reactive to light and accommodation. No scleral icterus. Extraocular muscles intact.  HEENT: Head atraumatic, normocephalic. Oropharynx and nasopharynx clear.  NECK:  Supple, no jugular venous distention. No thyroid enlargement, no tenderness.  LUNGS: Normal breath sounds bilaterally, no wheezing, rales,rhonchi or crepitation. No use of accessory muscles of respiration.  CARDIOVASCULAR: S1, S2 normal. No murmurs, rubs, or gallops.  ABDOMEN: Soft, nontender, nondistended. Bowel sounds present. No organomegaly or mass.  EXTREMITIES: No pedal edema, cyanosis, or clubbing. Left lower extremity edema below the knee NEUROLOGIC: Cranial nerves II through XII are intact. Muscle strength 5/5 in all extremities. Sensation intact. Gait not checked.  PSYCHIATRIC: The patient is alert and oriented x 3.  SKIN: No obvious rash, lesion, or ulcer.   ORDERS/RESULTS REVIEWED:   CBC  Recent Labs Lab 01/14/16 1619 01/16/16 0445 01/17/16 0449 01/17/16 1030  WBC 6.2 4.3  --  3.8  HGB 15.6 13.5  --  12.6*  HCT 48.1 41.5  --  38.5*  PLT 99* 92* 89* 87*  MCV 77.8* 79.5*  --  78.4*  MCH 25.2* 25.9*  --  25.7*  MCHC 32.4 32.5  --  32.7  RDW 19.0* 19.6*  --  20.1*  LYMPHSABS 0.7*  --   --   --   MONOABS 0.8  --   --   --  EOSABS 0.2  --   --   --   BASOSABS 0.1  --   --   --    ------------------------------------------------------------------------------------------------------------------  Chemistries    Recent Labs Lab 01/14/16 1619 01/16/16 0445 01/17/16 0449  NA 133* 136 136  K 5.5* 5.1 4.5  CL 102 109 110  CO2 22 20* 20*  GLUCOSE 198* 145* 110*  BUN 46* 34* 27*  CREATININE 2.34* 1.92* 1.68*  CALCIUM 8.9 8.0* 7.8*  MG  --   --  1.5*  AST 37  --   --   ALT 29  --   --   ALKPHOS 47  --   --   BILITOT 0.9  --   --    ------------------------------------------------------------------------------------------------------------------ estimated creatinine clearance is 39.4 mL/min (by C-G formula based on SCr of 1.68 mg/dL (H)). ------------------------------------------------------------------------------------------------------------------ No results for input(s): TSH, T4TOTAL, T3FREE, THYROIDAB in the last 72 hours.  Invalid input(s): FREET3  Cardiac Enzymes No results for input(s): CKMB, TROPONINI, MYOGLOBIN in the last 168 hours.  Invalid input(s): CK ------------------------------------------------------------------------------------------------------------------ Invalid input(s): POCBNP ---------------------------------------------------------------------------------------------------------------  RADIOLOGY: US Renal  Result Date: 01/15/2016 CLINICAL DATA:  Acute renal failure syndrome, chronic renal disease stage III EXAM: RENAL / URINARY TRACT ULTRASOUND COMPLETE COMPARISON:  CT from 01/07/2016 FINDINGS: Right Kidney: Length: 11.1 cm. Echogenicity within normal limits. No mass or hydronephrosis visualized. Left Kidney: Length: 11.7 cm. Echogenicity within normal limits. No mass or hydronephrosis visualized. Bladder: The bladder is contracted which may account the mild thickened appearance of the bladder wall up to 5 mm. No calculus or focal mass. Incidental note is made of postop megaly with the prostate measuring approximately 9.1 x 7.7 x 7.3 cm in craniocaudad by AP by transverse dimension. Heterogeneous ill-defined masses of the spleen mass seen on CT. The spleen  measures approximately 15 x 8.4 cm. IMPRESSION: Kidneys and bladder are without acute appearing abnormality. Heterogeneous solid masslike abnormalities of the large spleen as noted on recent CT may reflect lymphoma/leukemia. There is prostatomegaly as well. Electronically Signed   By: Ashley Royalty M.D.   On: 01/15/2016 17:45   Nm Pet Image Initial (pi) Skull Base To Thigh  Result Date: 01/16/2016 CLINICAL DATA:  Initial treatment strategy for lymphoma. EXAM: NUCLEAR MEDICINE PET SKULL BASE TO THIGH TECHNIQUE: 12.0 mCi F-18 FDG was injected intravenously. Full-ring PET imaging was performed from the skull base to thigh after the radiotracer. CT data was obtained and used for attenuation correction and anatomic localization. FASTING BLOOD GLUCOSE:  Value: 122 mg/dl COMPARISON:  None. FINDINGS: NECK Left posterior cervical nodes are identified, appear enlarged and hypermetabolic. Index node measures 1.2 cm and has an SUV max equal to 8.2, image 52 of series 3. Hypermetabolic left supraclavicular lymph node is identified. This measures 1.2 cm and has an SUV max equal to 10.68. CHEST Mild increased FDG uptake within the left hilar region has an SUV max equal to 4.4. Within the posterior mediastinum there is an enlarged lymph node between the esophagus and aorta. This measures 1.6 cm and has an SUV max equal to 15.2, image 100 of series 3. Enlarged and hypermetabolic left cardio phrenic angle lymph node measures 1.3 cm and has an SUV max equal to 9.2, image 124 of series 3. Bilateral pleural effusions are identified left-greater-than-right. No hypermetabolic pulmonary nodule or mass identified. ABDOMEN/PELVIS There is extensive hypermetabolic adenopathy within the upper abdomen. Periaortic nodal mass measures 3.4 cm and has an SUV max equal to 14.6, image 174 of series 3.  Hypermetabolic enlarged retrocaval node measures 2.1 cm and has an SUV max equal to 15.0. Extensive hypermetabolic adenopathy within the mesenteric  noted. Index jejunal node measures 2 cm and has an SUV max equal to 12.95, image 169 of series 3. Enlarged and hypermetabolic left external iliac lymph node measures 1.5 cm and has an SUV max equal to 15.78, image 232 of series 3. Extensive hypermetabolic tumor is identified throughout the spleen. The spleen measures 13 cm in length. Hypermetabolic lesion within the spleen measures 10.5 cm and has an SUV max equal to 13.02. There is evidence of trans peritoneal spread of hypermetabolic tumor within the abdomen. For example, within the left lower quadrant of the abdomen there is soft tissue stranding, fluid and nodularity along the peritoneal reflection. SUV max equals 5.07, image 219 of series 3. Ascites is noted extending over the right lobe of liver. SKELETON No focal hypermetabolic activity to suggest skeletal metastasis. IMPRESSION: 1. Examination is positive for extensive hypermetabolic adenopathy within the neck, chest, abdomen and pelvis compatible with the clinical history of lymphoma. 2. The spleen is enlarged and there is multi focal hypermetabolic splenic lesions. 3. Evidence of trans peritoneal spread of tumor within the upper abdomen. 4. Bilateral pleural effusions. 5. Aortic atherosclerosis and multi vessel coronary artery calcification. Electronically Signed   By: Kerby Moors M.D.   On: 01/16/2016 14:23   US Venous Img Lower Bilateral  Result Date: 01/16/2016 CLINICAL DATA:  Leg swelling for 2 weeks. History of left popliteal DVT in 2014. EXAM: BILATERAL LOWER EXTREMITY VENOUS DOPPLER ULTRASOUND TECHNIQUE: Gray-scale sonography with graded compression, as well as color Doppler and duplex ultrasound were performed to evaluate the lower extremity deep venous systems from the level of the common femoral vein and including the common femoral, femoral, profunda femoral, popliteal and calf veins including the posterior tibial, peroneal and gastrocnemius veins when visible. The superficial great  saphenous vein was also interrogated. Spectral Doppler was utilized to evaluate flow at rest and with distal augmentation maneuvers in the common femoral, femoral and popliteal veins. COMPARISON:  12/15/2012 FINDINGS: RIGHT LOWER EXTREMITY Normal compressibility, augmentation and color Doppler flow in the right common femoral vein, right femoral vein and right popliteal vein. The right saphenofemoral junction is patent. Right profunda femoral vein is patent without thrombus. Visualized right deep calf veins are patent without thrombus. LEFT LOWER EXTREMITY Normal compressibility and color Doppler flow in the left common femoral vein. The left saphenofemoral junction is patent. Left profunda femoral vein is patent without thrombus. Normal compression and color Doppler flow in the left femoral vein. Partial compressibility of the proximal left popliteal vein with nonocclusive thrombus. The distal left popliteal vein is not compressible with occlusive thrombus. There appears to be thrombus within the left peroneal and posterior tibial veins. IMPRESSION: Positive for deep venous thrombosis in the left lower extremity. There is thrombus involving the left calf veins and the left popliteal vein. The clot burden in the left popliteal vein is greater than it was in 2014 suggesting that this is acute thrombus rather than just chronic thrombus. Negative for deep venous thrombosis in right lower extremity. Electronically Signed   By: Markus Daft M.D.   On: 01/16/2016 07:49    EKG:  Orders placed or performed in visit on 08/27/06  . EKG 12-Lead    ASSESSMENT AND PLAN:  Active Problems:   Tumor lysis syndrome #1. Acute renal failure due to tumor lysis syndrome, Creatinine has improved from 2.34, GFR of 24-1.68, GFR  36, but now, continue IV fluids, appreciate nephrologist input, kidney function is improving, follow creatinine in the morning #2 metabolic acidosis, continue  bicarbonate orally, bicarbonate level remains  stable at 20, follow in the morning #3. Hyperkalemia, improved with hydration, bicarbonate #4. Hyponatremia, resolved with IV hydration #5, hyperuricemia, improving with IV hydration #6. Suspected lymphoma, PET scan reveals lymphadenopathy in neck, chest, abdomen and pelvis, splenomegaly, consistent with clinical diagnosis of lymphoma, per radiologist, oncologist. Burnis Medin discuss with interventional radiologist the best site for biopsy , discussed this patient, he is agreeable #7, acute left lower extremity DVT, continue patient on heparin intravenously, changed to Eliquis  prior to discharge after biopsy, discussed with Dr. Mike Gip today  Management plans discussed with the patient, family and they are in agreement.   DRUG ALLERGIES:  Allergies  Allergen Reactions  . Codeine Itching  . Penicillins Itching    CODE STATUS:     Code Status Orders        Start     Ordered   01/15/16 1512  Full code  Continuous     01/15/16 1511    Code Status History    Date Active Date Inactive Code Status Order ID Comments User Context   This patient has a current code status but no historical code status.    Advance Directive Documentation   [place] Most Recent Value  Type of Advance Directive  Living will  Pre-existing out of facility DNR order (yellow form or pink MOST form)  No data  "MOST" Form in Place?  No data      TOTAL TIME TAKING CARE OF THIS PATIENT: 40 minutes.   Discussed With Dr. Jaymes Graff M.D on 01/17/2016 at 2:42 PM  Between 7am to 6pm - Pager - [phone]  After 6pm go to [email or web address] - password [email or web address] [facility]  Office  [phone]  CC: Primary care physician; Kirk Ruths., MD

## 2016-01-17 NOTE — Clinical Social Work Note (Signed)
Clinical Social Work Assessment  Patient Details  Name: Hayden Martin MRN: [id number] Date of Birth: Jan 05, 1932  Date of referral:  01/17/16               Reason for consult:  Facility Placement                Permission sought to share information with:  Facility Sport and exercise psychologist, Family Supports Permission granted to share information::  Yes, Verbal Permission Granted  Name::     Kalab, Fidel [phone] or Zorian, Grinder   [phone]   Agency::  SNF admissions  Relationship::     Contact Information:     Housing/Transportation Living arrangements for the past 2 months:  Single Family Home Source of Information:  Patient Patient Interpreter Needed:  None Criminal Activity/Legal Involvement Pertinent to Current Situation/Hospitalization:  No - Comment as needed Significant Relationships:  Adult Children Lives with:  Self Do you feel safe going back to the place where you live?  No Need for family participation in patient care:  No (Coment)  Care giving concerns:  Patient feels like he needs short term rehab in order to return back home.   Social Worker assessment / plan: Patient is an 82 year old male who lives alone and is alert and oriented x4.  Patient has sons which are quite involved in his care.  Patient states he has not been to rehab before, MSW explained what to expect at SNF and what the process is for looking for bed placements.  MSW explained how insurance will pay for stay at SNF and what to expect day of discharge.  Patient son was at bedside and was in agreement with having patient go to SNF as well.  Patient states his is hopeful he will not need SNF for very long.  Patient and family did not express any other questions or concerns and gave MSW permission to begin bed search for [facility].    Employment status:  Retired Nurse, adult PT Recommendations:  [place] / Referral to  community resources:  [place]  Patient/Family's Response to care:  Patient agreeable to going to SNF for short term rehab.  Patient/Family's Understanding of and Emotional Response to Diagnosis, Current Treatment, and Prognosis:  Patient is hopeful he will not need to go to SNF for very long, he is motivated to work hard to return back home.   Emotional Assessment Appearance:  Appears stated age Attitude/Demeanor/Rapport:    Affect (typically observed):  Appropriate, Calm, Stable Orientation:  Oriented to Self, Oriented to Place, Oriented to  Time, Oriented to Situation Alcohol / Substance use:  Not Applicable Psych involvement (Current and /or in the community):  No (Comment)  Discharge Needs  Concerns to be addressed:  Lack of Support Readmission within the last 30 days:  No Current discharge risk:  Lives alone, Lack of support system Barriers to Discharge:  No Barriers Identified   Ross Ludwig 01/17/2016, 11:50 PM

## 2016-01-17 NOTE — Progress Notes (Addendum)
ANTICOAGULATION CONSULT NOTE - Initial Consult  Pharmacy Consult for heparin drip Indication: DVT  Allergies  Allergen Reactions  . Codeine Itching  . Penicillins Itching    Patient Measurements: Height: 5\' 11"  (180.3 cm) Weight: 203 lb 8 oz (92.3 kg) IBW/kg (Calculated) : 75.3 Heparin Dosing Weight: 92 kg  Vital Signs: Temp: 97.6 F (36.4 C) (11/16 0512) Temp Source: Oral (11/16 0512) BP: 121/55 (11/16 0512) Pulse Rate: 88 (11/16 0512)  Labs:  Recent Labs  01/14/16 1619 01/16/16 0445 01/17/16 0449  HGB 15.6 13.5  --   HCT 48.1 41.5  --   PLT 99* 92* 89*  CREATININE 2.34* 1.92* 1.68*    Estimated Creatinine Clearance: 39.4 mL/min (by C-G formula based on SCr of 1.68 mg/dL (H)).   Medical History: Past Medical History:  Diagnosis Date  . Clotting disorder ([place])   . Diabetes mellitus without complication ([place])   . Heart disease   . Hypertension    Medications:  Scheduled:  . allopurinol  300 mg Oral Daily  . aspirin  325 mg Oral Daily  . carvedilol  6.25 mg Oral BID WC  . insulin aspart  0-9 Units Subcutaneous TID WC  . insulin glargine  15 Units Subcutaneous QHS  . ketoconazole   Topical Daily  . magnesium sulfate 1 - 4 g bolus IVPB  4 g Intravenous Once  . pneumococcal 23 valent vaccine  0.5 mL Intramuscular Tomorrow-1000  . sodium bicarbonate  650 mg Oral BID   Assessment: Pharmacy consulted to dose and monitor heparin drip in this 82 year old male admitted for tumor lysis syndrome and diagnosed with an acute lower extremity DVT.   Patient was taking rivaroxaban prior to admission for a history of DVT and was converted to apixaban on admission due to ARF and CrCl < 30 mL/min. Patient has received 2 doses of apixaban during admission - last dose at 2206 on 11/15.   Baseline labs (CBC, INR, APTT, and heparin level) have been ordered STAT.  Heparin drip will begin at 1000 this morning (12 hours after last apixaban dose) and will need to be  monitored/adjusted using APTT since patient has been prescribed NOACs. Once APTT and heparin level correlate, can monitor using heparin level.  Goal of Therapy:  Heparin level 0.3-0.7 units/ml aPTT 66 - 102 seconds Monitor platelets by anticoagulation protocol: Yes   Plan:  Discontinued apixaban order and dosing consult  NO bolus as patient is already anticoagulated on apixaban Start heparin infusion at 1500 units/hr (~16 units/kg/hr) Check heparin level & APTT in 8 hours and daily while on heparin Continue to monitor H&H and platelets  Lenis Noon, PharmD, BCPS Clinical Pharmacist 01/17/2016,8:59 AM   Addendum:  Will discontinue ASA 325 mg PO daily while on heparin drip per discussion with MD. Plt 89.  Lenis Noon, PharmD 01/17/16 2:32 PM

## 2016-01-17 NOTE — NC FL2 (Signed)
[place] LEVEL OF CARE SCREENING TOOL     IDENTIFICATION  Patient Name: Hayden Martin Birthdate: 1931/09/29 Sex: male Admission Date (Current Location): 01/15/2016  [place] and [place] Number:  Engineering geologist and Address:  [facility], [address] [place], [place] [address]      Provider Number: [id number]  Attending Physician Name and Address:  Theodoro Grist, MD  Relative Name and Phone Number:  Kendrix, Lar[place]ca [phone] or An, Reeds   [phone]     Current Level of Care: Hospital Recommended Level of Care: [place] Prior Approval Number:    Date Approved/Denied:   PASRR Number: [id number]  Discharge Plan: SNF    Current Diagnoses: Patient Active Problem List   Diagnosis Date Noted  . Tumor lysis syndrome 01/15/2016  . Splenic mass 01/14/2016  . Adenopathy 01/14/2016    Orientation RESPIRATION BLADDER Height & Weight     Self, Time, Situation, Place  Normal Continent Weight: 203 lb 8 oz (92.3 kg) Height:  5\' 11"  (180.3 cm)  BEHAVIORAL SYMPTOMS/MOOD NEUROLOGICAL BOWEL NUTRITION STATUS      Continent Diet ([place] Modified)  AMBULATORY STATUS COMMUNICATION OF NEEDS Skin   Limited Assist Verbally Normal                       Personal Care Assistance Level of Assistance  Bathing, Feeding, Dressing Bathing Assistance: Limited assistance Feeding assistance: Independent Dressing Assistance: Limited assistance     Functional Limitations Info  Sight, Hearing, Speech Sight Info: Adequate Hearing Info: Impaired Speech Info: Adequate    SPECIAL CARE FACTORS FREQUENCY  PT (By licensed PT)     PT Frequency: 5x a week              Contractures      Additional Factors Info  Allergies, Insulin Sliding Scale, Code Status Code Status Info: Full Allergies Info: CODEINE, PENICILLINS   Insulin Sliding Scale Info: insulin aspart (novoLOG) injection 0-9 Units 3x a day with  meals       Current Medications (01/17/2016):  This is the current hospital active medication list Current Facility-Administered Medications  Medication Dose Route Frequency Provider Last Rate Last Dose  . 0.9 %  sodium chloride infusion   Intravenous Continuous Lavonia Dana, MD 75 mL/hr at 01/17/16 1055    . acetaminophen (TYLENOL) tablet 650 mg  650 mg Oral Q6H PRN Dustin Flock, MD       Or  . acetaminophen (TYLENOL) suppository 650 mg  650 mg Rectal Q6H PRN Dustin Flock, MD      . allopurinol (ZYLOPRIM) tablet 300 mg  300 mg Oral Daily Lequita Asal, MD   300 mg at 01/17/16 0856  . carvedilol (COREG) tablet 6.25 mg  6.25 mg Oral BID WC Dustin Flock, MD   6.25 mg at 01/17/16 1805  . heparin ADULT infusion 100 units/mL (25000 units/277mL sodium chloride 0.45%)  1,300 Units/hr Intravenous Continuous Pernell Dupre, RPH 13 mL/hr at 01/17/16 1909 1,300 Units/hr at 01/17/16 1909  . HYDROcodone-acetaminophen (NORCO/VICODIN) 5-325 MG per tablet 1-2 tablet  1-2 tablet Oral Q4H PRN Dustin Flock, MD      . insulin aspart (novoLOG) injection 0-9 Units  0-9 Units Subcutaneous TID WC Dustin Flock, MD   1 Units at 01/16/16 1802  . insulin glargine (LANTUS) injection 15 Units  15 Units Subcutaneous QHS Dustin Flock, MD   15 Units at 01/17/16 2153  . ketoconazole (NIZORAL) 2 %  cream   Topical Daily Dustin Flock, MD      . ondansetron [facility]) tablet 4 mg  4 mg Oral Q6H PRN Dustin Flock, MD       Or  . ondansetron (ZOFRAN) injection 4 mg  4 mg Intravenous Q6H PRN Dustin Flock, MD      . pneumococcal 23 valent vaccine (PNU-IMMUNE) injection 0.5 mL  0.5 mL Intramuscular Tomorrow-1000 Dustin Flock, MD      . sodium bicarbonate tablet 650 mg  650 mg Oral BID Theodoro Grist, MD   650 mg at 01/17/16 2045     Discharge Medications: Please see discharge summary for a list of discharge medications.  Relevant Imaging Results:  Relevant Lab Results:   Additional Information SSN  [id number]  Ross Ludwig

## 2016-01-17 NOTE — Progress Notes (Signed)
[facility]  ROUNDING NOTE   Subjective:   Family at bedside.   Creatinine 1.68 (1.92) (2.34)  NS at 75  Objective:  Vital signs in last 24 hours:  Temp:  [97.6 F (36.4 C)-99.2 F (37.3 C)] 98.4 F (36.9 C) (11/16 1301) Pulse Rate:  [78-89] 78 (11/16 1301) Resp:  [16-20] 19 (11/16 1301) BP: (107-126)/(53-65) 107/53 (11/16 1301) SpO2:  [93 %-96 %] 96 % (11/16 1301)  Weight change:  Filed Weights   01/15/16 1430 01/15/16 1533 01/15/16 1613  Weight: 92.3 kg (203 lb 8 oz) 92.3 kg (203 lb 8 oz) 92.3 kg (203 lb 8 oz)    Intake/Output: I/O last 3 completed shifts: In: 2220 [I.V.:2220] Out: -    Intake/Output this shift:  No intake/output data recorded.  Physical Exam: General: NAD,   Head: Normocephalic, atraumatic. Moist oral mucosal membranes  Eyes: Anicteric, PERRL  Neck: Supple, trachea midline  Lungs:  Clear to auscultation  Heart: Regular rate and rhythm  Abdomen:  Soft, nontender,   Extremities:  left extremity with 1+ edema, no right lower extremity edema  Neurologic: Nonfocal, moving all four extremities  Skin: No lesions       Basic Metabolic Panel:  Recent Labs Lab 01/14/16 1619 01/16/16 0445 01/17/16 0449  NA 133* 136 136  K 5.5* 5.1 4.5  CL 102 109 110  CO2 22 20* 20*  GLUCOSE 198* 145* 110*  BUN 46* 34* 27*  CREATININE 2.34* 1.92* 1.68*  CALCIUM 8.9 8.0* 7.8*  MG  --   --  1.5*  PHOS  --  2.4* 2.4*    Liver Function Tests:  Recent Labs Lab 01/14/16 1619  AST 37  ALT 29  ALKPHOS 47  BILITOT 0.9  PROT 6.7  ALBUMIN 3.5   No results for input(s): LIPASE, AMYLASE in the last 168 hours. No results for input(s): AMMONIA in the last 168 hours.  CBC:  Recent Labs Lab 01/14/16 1619 01/16/16 0445 01/17/16 0449 01/17/16 1030  WBC 6.2 4.3  --  3.8  NEUTROABS 4.4  --   --   --   HGB 15.6 13.5  --  12.6*  HCT 48.1 41.5  --  38.5*  MCV 77.8* 79.5*  --  78.4*  PLT 99* 92* 89* 87*    Cardiac Enzymes: No results  for input(s): CKTOTAL, CKMB, CKMBINDEX, TROPONINI in the last 168 hours.  BNP: Invalid input(s): POCBNP  CBG:  Recent Labs Lab 01/16/16 1201 01/16/16 1729 01/16/16 2159 01/17/16 0728 01/17/16 1137  GLUCAP 122* 129* 137* 94 113*    Microbiology: No results found for this or any previous visit.  Coagulation Studies:  Recent Labs  01/17/16 1030  LABPROT 19.8*  INR 1.66    Urinalysis: No results for input(s): COLORURINE, LABSPEC, PHURINE, GLUCOSEU, HGBUR, BILIRUBINUR, KETONESUR, PROTEINUR, UROBILINOGEN, NITRITE, LEUKOCYTESUR in the last 72 hours.  Invalid input(s): APPERANCEUR    Imaging: US Renal  Result Date: 01/15/2016 CLINICAL DATA:  Acute renal failure syndrome, chronic renal disease stage III EXAM: RENAL / URINARY TRACT ULTRASOUND COMPLETE COMPARISON:  CT from 01/07/2016 FINDINGS: Right Kidney: Length: 11.1 cm. Echogenicity within normal limits. No mass or hydronephrosis visualized. Left Kidney: Length: 11.7 cm. Echogenicity within normal limits. No mass or hydronephrosis visualized. Bladder: The bladder is contracted which may account the mild thickened appearance of the bladder wall up to 5 mm. No calculus or focal mass. Incidental note is made of postop megaly with the prostate measuring approximately 9.1 x 7.7 x 7.3  cm in craniocaudad by AP by transverse dimension. Heterogeneous ill-defined masses of the spleen mass seen on CT. The spleen measures approximately 15 x 8.4 cm. IMPRESSION: Kidneys and bladder are without acute appearing abnormality. Heterogeneous solid masslike abnormalities of the large spleen as noted on recent CT may reflect lymphoma/leukemia. There is prostatomegaly as well. Electronically Signed   By: Ashley Royalty M.D.   On: 01/15/2016 17:45   Nm Pet Image Initial (pi) Skull Base To Thigh  Result Date: 01/16/2016 CLINICAL DATA:  Initial treatment strategy for lymphoma. EXAM: NUCLEAR MEDICINE PET SKULL BASE TO THIGH TECHNIQUE: 12.0 mCi F-18 FDG was  injected intravenously. Full-ring PET imaging was performed from the skull base to thigh after the radiotracer. CT data was obtained and used for attenuation correction and anatomic localization. FASTING BLOOD GLUCOSE:  Value: 122 mg/dl COMPARISON:  None. FINDINGS: NECK Left posterior cervical nodes are identified, appear enlarged and hypermetabolic. Index node measures 1.2 cm and has an SUV max equal to 8.2, image 52 of series 3. Hypermetabolic left supraclavicular lymph node is identified. This measures 1.2 cm and has an SUV max equal to 10.68. CHEST Mild increased FDG uptake within the left hilar region has an SUV max equal to 4.4. Within the posterior mediastinum there is an enlarged lymph node between the esophagus and aorta. This measures 1.6 cm and has an SUV max equal to 15.2, image 100 of series 3. Enlarged and hypermetabolic left cardio phrenic angle lymph node measures 1.3 cm and has an SUV max equal to 9.2, image 124 of series 3. Bilateral pleural effusions are identified left-greater-than-right. No hypermetabolic pulmonary nodule or mass identified. ABDOMEN/PELVIS There is extensive hypermetabolic adenopathy within the upper abdomen. Periaortic nodal mass measures 3.4 cm and has an SUV max equal to 14.6, image 174 of series 3. Hypermetabolic enlarged retrocaval node measures 2.1 cm and has an SUV max equal to 15.0. Extensive hypermetabolic adenopathy within the mesenteric noted. Index jejunal node measures 2 cm and has an SUV max equal to 12.95, image 169 of series 3. Enlarged and hypermetabolic left external iliac lymph node measures 1.5 cm and has an SUV max equal to 15.78, image 232 of series 3. Extensive hypermetabolic tumor is identified throughout the spleen. The spleen measures 13 cm in length. Hypermetabolic lesion within the spleen measures 10.5 cm and has an SUV max equal to 13.02. There is evidence of trans peritoneal spread of hypermetabolic tumor within the abdomen. For example, within the  left lower quadrant of the abdomen there is soft tissue stranding, fluid and nodularity along the peritoneal reflection. SUV max equals 5.07, image 219 of series 3. Ascites is noted extending over the right lobe of liver. SKELETON No focal hypermetabolic activity to suggest skeletal metastasis. IMPRESSION: 1. Examination is positive for extensive hypermetabolic adenopathy within the neck, chest, abdomen and pelvis compatible with the clinical history of lymphoma. 2. The spleen is enlarged and there is multi focal hypermetabolic splenic lesions. 3. Evidence of trans peritoneal spread of tumor within the upper abdomen. 4. Bilateral pleural effusions. 5. Aortic atherosclerosis and multi vessel coronary artery calcification. Electronically Signed   By: Kerby Moors M.D.   On: 01/16/2016 14:23   US Venous Img Lower Bilateral  Result Date: 01/16/2016 CLINICAL DATA:  Leg swelling for 2 weeks. History of left popliteal DVT in 2014. EXAM: BILATERAL LOWER EXTREMITY VENOUS DOPPLER ULTRASOUND TECHNIQUE: Gray-scale sonography with graded compression, as well as color Doppler and duplex ultrasound were performed to evaluate the lower extremity  deep venous systems from the level of the common femoral vein and including the common femoral, femoral, profunda femoral, popliteal and calf veins including the posterior tibial, peroneal and gastrocnemius veins when visible. The superficial great saphenous vein was also interrogated. Spectral Doppler was utilized to evaluate flow at rest and with distal augmentation maneuvers in the common femoral, femoral and popliteal veins. COMPARISON:  12/15/2012 FINDINGS: RIGHT LOWER EXTREMITY Normal compressibility, augmentation and color Doppler flow in the right common femoral vein, right femoral vein and right popliteal vein. The right saphenofemoral junction is patent. Right profunda femoral vein is patent without thrombus. Visualized right deep calf veins are patent without thrombus.  LEFT LOWER EXTREMITY Normal compressibility and color Doppler flow in the left common femoral vein. The left saphenofemoral junction is patent. Left profunda femoral vein is patent without thrombus. Normal compression and color Doppler flow in the left femoral vein. Partial compressibility of the proximal left popliteal vein with nonocclusive thrombus. The distal left popliteal vein is not compressible with occlusive thrombus. There appears to be thrombus within the left peroneal and posterior tibial veins. IMPRESSION: Positive for deep venous thrombosis in the left lower extremity. There is thrombus involving the left calf veins and the left popliteal vein. The clot burden in the left popliteal vein is greater than it was in 2014 suggesting that this is acute thrombus rather than just chronic thrombus. Negative for deep venous thrombosis in right lower extremity. Electronically Signed   By: Markus Daft M.D.   On: 01/16/2016 07:49     Medications:   . sodium chloride 75 mL/hr at 01/17/16 1055  . heparin 1,500 Units/hr (01/17/16 1052)   . allopurinol  300 mg Oral Daily  . aspirin  325 mg Oral Daily  . carvedilol  6.25 mg Oral BID WC  . insulin aspart  0-9 Units Subcutaneous TID WC  . insulin glargine  15 Units Subcutaneous QHS  . ketoconazole   Topical Daily  . pneumococcal 23 valent vaccine  0.5 mL Intramuscular Tomorrow-1000  . sodium bicarbonate  650 mg Oral BID   acetaminophen **OR** acetaminophen, HYDROcodone-acetaminophen, ondansetron **OR** ondansetron (ZOFRAN) IV  Assessment/ Plan:  Hayden Martin is a 82 y.o. white male with new onset lymphoma, hypertension, diabetes mellitus type II, hyperlipidemia, coronary artery disease, DVT on xarelto, BPH, RBB, who was admitted to [facility] on 01/15/2016 for tumor lysis syndrome renal insufficiency hyperuremia   1. Acute renal failure on chronic kidney disease stage III: with hyperkalemia and hyponatremia: baseline creatinine of 1.6, eGFR of  42 from 12/31/15.  CT angiogram on 11/6. Poor PO intake: albumin low at 3.5 - Continue IV fluids at 33m/hr  2. Tumor lysis syndrome with hyperuremia: G6PD normal range - Continue IV fluids - Discussed role of dialysis in acute renal failure with patient and family.  3. Metabolic acidosis: from NS - sodium bicarbonate PO   LOS: 2 Anastasios Melander 11/16/20172:06 PM

## 2016-01-17 NOTE — Clinical Social Work Note (Signed)
MSW spoke with patient and his family regarding SNF placement options.  Patient is in agreement to have MSW begin bed search process for patient.  MSW was given permission by patient and family to look for SNF placement in [facility].  Formal assessment to follow.  Jones Broom. Norval Morton, MSW [phone]  Mon-Fri 8a-4:30p 01/17/2016 5:18 PM

## 2016-01-17 NOTE — Progress Notes (Signed)
Physical Therapy Treatment Patient Details Name: Hayden Martin MRN: [id number] DOB: 1931-10-13 Today's Date: 01/17/2016    History of Present Illness Hayden Martin  is a 82 y.o. male with a known history of  DVT, diabetes, hypertension who is referred from oncology office due to worsening renal failure, elevated LDH level, and elevated uric acid level who was seen by oncology first time in November 13. Patient previously was noted to have weight loss and abnormal blood work and abdominal CT revealed multiple solid masses within the spleen as well as extensive lymphadenopathy. Patient was planned to have a PET scan and a biopsy of this. Who went for follow-up and had repeated blood work which showed his has elevated creatinine and his uric acid level is high and LDH is high as well. Therefore he is admitted for further evaluation and therapy. Patient complains of feeling very weak and tired and has not been able to walk.    PT Comments    Pt is making good progress towards goals and demonstrating improvement in functional mobility. Pt still demonstrates slow gait speed this date, however pt appears motivated to participate in therapy. No SOB symptoms noted with ambulation and sats WNL. Pt is getting closer to baseline level.   Follow Up Recommendations  Home health PT;Supervision - Intermittent     Equipment Recommendations  None recommended by PT    Recommendations for Other Services       Precautions / Restrictions Precautions Precautions: Fall Restrictions Weight Bearing Restrictions: No    Mobility  Bed Mobility Overal bed mobility: Modified Independent Bed Mobility: Supine to Sit     Supine to sit: Modified independent (Device/Increase time)     General bed mobility comments: safe technique with bed mobility, does not require assist from therapist. Once seated at EOB, able to maintain balance while family tied up gown  Transfers Overall transfer level: Modified  independent Equipment used: Rolling walker (2 wheeled) Transfers: Sit to/from Stand Sit to Stand: Supervision         General transfer comment: safe technique performed with upright posture noted. No LOB noted  Ambulation/Gait Ambulation/Gait assistance: Supervision Ambulation Distance (Feet): 200 Feet Assistive device: Rolling walker (2 wheeled) Gait Pattern/deviations: Step-through pattern Gait velocity: Decreased   General Gait Details: ambulated around RN station with improved reciprocal gait pattern and safe technique. Pt able to perform 10' in 11 seconds, demonstrating slow speed. O2 sats at 93% post ambulation on RA.   Stairs            Wheelchair Mobility    Modified Rankin (Stroke Patients Only)       Balance                                    Cognition Arousal/Alertness: Awake/alert Behavior During Therapy: WFL for tasks assessed/performed Overall Cognitive Status: Within Functional Limits for tasks assessed                      Exercises Other Exercises Other Exercises: Further ambulation performed to bathroom with only cga needing for tolieting tasks.    General Comments        Pertinent Vitals/Pain Pain Assessment: No/denies pain    Home Living                      Prior Function  PT Goals (current goals can now be found in the care plan section) Acute Rehab PT Goals Patient Stated Goal: Return to prior level of function at home PT Goal Formulation: With patient/family Time For Goal Achievement: 01/30/16 Potential to Achieve Goals: Good Progress towards PT goals: Progressing toward goals    Frequency    Min 2X/week      PT Plan Discharge plan needs to be updated    Co-evaluation             End of Session Equipment Utilized During Treatment: Gait belt Activity Tolerance: Patient tolerated treatment well Patient left: in chair;with call bell/phone within reach;with chair alarm  set;with family/visitor present     Time: 1450-1513 PT Time Calculation (min) (ACUTE ONLY): 23 min  Charges:  $Gait Training: 8-22 mins $Therapeutic Activity: 8-22 mins                    G Codes:      Hayden Martin February 13, 2016, 4:57 PM  Hayden Martin, PT, DPT [phone]

## 2016-01-18 ENCOUNTER — Inpatient Hospital Stay (HOSPITAL_COMMUNITY)
Admission: AD | Admit: 2016-01-18 | Discharge: 2016-01-18 | Disposition: A | Discharge status: Home / Self Care | Payer: Medicare Other | Source: Ambulatory Visit | Attending: Hematology and Oncology | Admitting: Hematology and Oncology

## 2016-01-18 ENCOUNTER — Encounter
Admission: AD | Disposition: A | Discharge status: Skilled Nursing Facility | Payer: Self-pay | Source: Ambulatory Visit | Attending: Specialist

## 2016-01-18 ENCOUNTER — Ambulatory Visit: Admission: RE | Admit: 2016-01-18 | Payer: Medicare Other | Source: Ambulatory Visit

## 2016-01-18 DIAGNOSIS — I10 Essential (primary) hypertension: Secondary | ICD-10-CM

## 2016-01-18 DIAGNOSIS — R634 Abnormal weight loss: Secondary | ICD-10-CM

## 2016-01-18 DIAGNOSIS — R972 Elevated prostate specific antigen [PSA]: Secondary | ICD-10-CM

## 2016-01-18 DIAGNOSIS — E883 Tumor lysis syndrome: Principal | ICD-10-CM

## 2016-01-18 DIAGNOSIS — R531 Weakness: Secondary | ICD-10-CM

## 2016-01-18 DIAGNOSIS — C8590 Non-Hodgkin lymphoma, unspecified, unspecified site: Secondary | ICD-10-CM

## 2016-01-18 DIAGNOSIS — J90 Pleural effusion, not elsewhere classified: Secondary | ICD-10-CM

## 2016-01-18 DIAGNOSIS — N400 Enlarged prostate without lower urinary tract symptoms: Secondary | ICD-10-CM

## 2016-01-18 DIAGNOSIS — D696 Thrombocytopenia, unspecified: Secondary | ICD-10-CM

## 2016-01-18 DIAGNOSIS — Z86718 Personal history of other venous thrombosis and embolism: Secondary | ICD-10-CM

## 2016-01-18 DIAGNOSIS — E119 Type 2 diabetes mellitus without complications: Secondary | ICD-10-CM

## 2016-01-18 DIAGNOSIS — Z794 Long term (current) use of insulin: Secondary | ICD-10-CM

## 2016-01-18 DIAGNOSIS — M549 Dorsalgia, unspecified: Secondary | ICD-10-CM

## 2016-01-18 DIAGNOSIS — Z0189 Encounter for other specified special examinations: Secondary | ICD-10-CM

## 2016-01-18 DIAGNOSIS — R161 Splenomegaly, not elsewhere classified: Secondary | ICD-10-CM

## 2016-01-18 DIAGNOSIS — Z79899 Other long term (current) drug therapy: Secondary | ICD-10-CM

## 2016-01-18 DIAGNOSIS — N189 Chronic kidney disease, unspecified: Secondary | ICD-10-CM

## 2016-01-18 DIAGNOSIS — I82402 Acute embolism and thrombosis of unspecified deep veins of left lower extremity: Secondary | ICD-10-CM

## 2016-01-18 DIAGNOSIS — M2550 Pain in unspecified joint: Secondary | ICD-10-CM

## 2016-01-18 HISTORY — PX: PERIPHERAL VASCULAR CATHETERIZATION: SHX172C

## 2016-01-18 LAB — GLUCOSE, CAPILLARY
GLUCOSE-CAPILLARY: 84 mg/dL (ref 65–99)
GLUCOSE-CAPILLARY: 85 mg/dL (ref 65–99)
Glucose-Capillary: 58 mg/dL — ABNORMAL LOW (ref 65–99)
Glucose-Capillary: 58 mg/dL — ABNORMAL LOW (ref 65–99)
Glucose-Capillary: 116 mg/dL — ABNORMAL HIGH (ref 65–99)
Glucose-Capillary: 122 mg/dL — ABNORMAL HIGH (ref 65–99)

## 2016-01-18 LAB — CBC
HEMATOCRIT: 41.7 % (ref 40.0–52.0)
Hemoglobin: 13.3 g/dL (ref 13.0–18.0)
MCH: 25.3 pg — ABNORMAL LOW (ref 26.0–34.0)
MCHC: 32 g/dL (ref 32.0–36.0)
MCV: 79 fL — ABNORMAL LOW (ref 80.0–100.0)
Platelets: 102 10*3/uL — ABNORMAL LOW (ref 150–440)
RBC: 5.28 MIL/uL (ref 4.40–5.90)
RDW: 19.6 % — AB (ref 11.5–14.5)
WBC: 3.8 10*3/uL (ref 3.8–10.6)

## 2016-01-18 LAB — BASIC METABOLIC PANEL
Anion gap: 8 (ref 5–15)
BUN: 22 mg/dL — ABNORMAL HIGH (ref 6–20)
CO2: 19 mmol/L — ABNORMAL LOW (ref 22–32)
Calcium: 7.7 mg/dL — ABNORMAL LOW (ref 8.9–10.3)
Chloride: 108 mmol/L (ref 101–111)
Creatinine, Ser: 1.55 mg/dL — ABNORMAL HIGH (ref 0.61–1.24)
GFR calc Af Amer: 46 mL/min — ABNORMAL LOW (ref >60)
GFR calc non Af Amer: 40 mL/min — ABNORMAL LOW (ref >60)
Glucose, Bld: 69 mg/dL (ref 65–99)
Potassium: 4.5 mmol/L (ref 3.5–5.1)
Sodium: 135 mmol/L (ref 135–145)

## 2016-01-18 LAB — PHOSPHORUS: Phosphorus: 2.3 mg/dL — ABNORMAL LOW (ref 2.5–4.6)

## 2016-01-18 LAB — CREATININE, SERUM
Creatinine, Ser: 1.6 mg/dL — ABNORMAL HIGH (ref 0.61–1.24)
GFR calc Af Amer: 45 mL/min — ABNORMAL LOW (ref >60)
GFR calc non Af Amer: 38 mL/min — ABNORMAL LOW (ref >60)

## 2016-01-18 LAB — URIC ACID: Uric Acid, Serum: 7.8 mg/dL — ABNORMAL HIGH (ref 4.4–7.6)

## 2016-01-18 LAB — HEPARIN LEVEL (UNFRACTIONATED): HEPARIN UNFRACTIONATED: 1.37 [IU]/mL — AB (ref 0.30–0.70)

## 2016-01-18 LAB — APTT: aPTT: 96 seconds — ABNORMAL HIGH (ref 24–36)

## 2016-01-18 LAB — PLATELET COUNT: PLATELETS: 88 10*3/uL — AB (ref 150–440)

## 2016-01-18 LAB — MAGNESIUM: Magnesium: 1.8 mg/dL (ref 1.7–2.4)

## 2016-01-18 LAB — HEMOGLOBIN: HEMOGLOBIN: 13 g/dL (ref 13.0–18.0)

## 2016-01-18 LAB — ECHOCARDIOGRAM COMPLETE
Height: 71 in
Weight: 3256 oz

## 2016-01-18 SURGERY — IVC FILTER INSERTION
Anesthesia: Moderate Sedation

## 2016-01-18 MED ORDER — MIDAZOLAM HCL 2 MG/2ML IJ SOLN
INTRAMUSCULAR | Status: DC | PRN
  Administered 2016-01-18: 2 mg via INTRAVENOUS

## 2016-01-18 MED ORDER — PERFLUTREN LIPID MICROSPHERE
1.0000 mL | INTRAVENOUS | Status: AC | PRN
  Administered 2016-01-18: 2 mL via INTRAVENOUS
  Filled 2016-01-18: qty 10

## 2016-01-18 MED ORDER — LIDOCAINE HCL (PF) 1 % IJ SOLN
INTRAMUSCULAR | Status: AC
  Filled 2016-01-18: qty 30

## 2016-01-18 MED ORDER — SODIUM CHLORIDE 0.9 % IV SOLN: INTRAVENOUS | Status: DC

## 2016-01-18 MED ORDER — CLINDAMYCIN PHOSPHATE 300 MG/50ML IV SOLN
300.0000 mg | Freq: Once | INTRAVENOUS | Status: AC
  Administered 2016-01-18: 300 mg via INTRAVENOUS

## 2016-01-18 MED ORDER — INSULIN GLARGINE 100 UNIT/ML ~~LOC~~ SOLN
8.0000 [IU] | Freq: Every day | SUBCUTANEOUS | Status: DC
  Administered 2016-01-18: 8 [IU] via SUBCUTANEOUS
  Filled 2016-01-18 (×2): qty 0.08

## 2016-01-18 MED ORDER — MIDAZOLAM HCL 5 MG/5ML IJ SOLN
INTRAMUSCULAR | Status: AC
  Filled 2016-01-18: qty 5

## 2016-01-18 MED ORDER — HEPARIN (PORCINE) IN NACL 100-0.45 UNIT/ML-% IJ SOLN
1100.0000 [IU]/h | INTRAMUSCULAR | Status: DC
  Administered 2016-01-18 – 2016-01-19 (×2): 1100 [IU]/h via INTRAVENOUS
  Filled 2016-01-18 (×5): qty 250

## 2016-01-18 MED ORDER — HEPARIN (PORCINE) IN NACL 2-0.9 UNIT/ML-% IJ SOLN
INTRAMUSCULAR | Status: AC
  Filled 2016-01-18: qty 500

## 2016-01-18 MED ORDER — CLINDAMYCIN PHOSPHATE 300 MG/50ML IV SOLN
INTRAVENOUS | Status: AC
  Filled 2016-01-18: qty 50

## 2016-01-18 MED ORDER — HEPARIN (PORCINE) IN NACL 100-0.45 UNIT/ML-% IJ SOLN: 1100.0000 [IU]/h | INTRAMUSCULAR | Status: DC

## 2016-01-18 SURGICAL SUPPLY — 4 items
FILTER VC CELECT-FEMORAL (Filter) ×3 IMPLANT
PACK ANGIOGRAPHY (CUSTOM PROCEDURE TRAY) ×3 IMPLANT
SET INTRO CAPELLA COAXIAL (SET/KITS/TRAYS/PACK) ×3 IMPLANT
WIRE J 3MM .035X145CM (WIRE) ×3 IMPLANT

## 2016-01-18 NOTE — Progress Notes (Signed)
Discontinue heparin drip per Dr Ether Griffins

## 2016-01-18 NOTE — Progress Notes (Signed)
[facility]  ROUNDING NOTE   Subjective:   Family at bedside.   IVC filter and port for Monday.   Creatinine stable  NS at 75  Objective:  Vital signs in last 24 hours:  Temp:  [98.2 F (36.8 C)-98.9 F (37.2 C)] 98.2 F (36.8 C) (11/17 1349) Pulse Rate:  [80-86] 80 (11/17 1349) Resp:  [16-20] 16 (11/17 1349) BP: (108-121)/(54-57) 116/54 (11/17 1349) SpO2:  [91 %-93 %] 93 % (11/17 1349)  Weight change:  Filed Weights   01/15/16 1430 01/15/16 1533 01/15/16 1613  Weight: 92.3 kg (203 lb 8 oz) 92.3 kg (203 lb 8 oz) 92.3 kg (203 lb 8 oz)    Intake/Output: I/O last 3 completed shifts: In: 1872.7 [P.O.:240; I.V.:1632.7] Out: -    Intake/Output this shift:  Total I/O In: 120 [P.O.:120] Out: -   Physical Exam: General: NAD,   Head: Normocephalic, atraumatic. Moist oral mucosal membranes  Eyes: Anicteric, PERRL  Neck: Supple, trachea midline  Lungs:  Clear to auscultation  Heart: Regular rate and rhythm  Abdomen:  Soft, nontender,   Extremities:  left extremity with 1+ edema, no right lower extremity edema  Neurologic: Nonfocal, moving all four extremities  Skin: No lesions       Basic Metabolic Panel:  Recent Labs Lab 01/14/16 1619 01/16/16 0445 01/17/16 0449 01/18/16 0346  NA 133* 136 136 135  K 5.5* 5.1 4.5 4.5  CL 102 109 110 108  CO2 22 20* 20* 19*  GLUCOSE 198* 145* 110* 69  BUN 46* 34* 27* 22*  CREATININE 2.34* 1.92* 1.68* 1.60*  1.55*  CALCIUM 8.9 8.0* 7.8* 7.7*  MG  --   --  1.5* 1.8  PHOS  --  2.4* 2.4* 2.3*    Liver Function Tests:  Recent Labs Lab 01/14/16 1619  AST 37  ALT 29  ALKPHOS 47  BILITOT 0.9  PROT 6.7  ALBUMIN 3.5   No results for input(s): LIPASE, AMYLASE in the last 168 hours. No results for input(s): AMMONIA in the last 168 hours.  CBC:  Recent Labs Lab 01/14/16 1619 01/16/16 0445 01/17/16 0449 01/17/16 1030 01/18/16 0346 01/18/16 0943 01/18/16 1320  WBC 6.2 4.3  --  3.8  --   --  3.8   NEUTROABS 4.4  --   --   --   --   --   --   HGB 15.6 13.5  --  12.6*  --  13.0 13.3  HCT 48.1 41.5  --  38.5*  --   --  41.7  MCV 77.8* 79.5*  --  78.4*  --   --  79.0*  PLT 99* 92* 89* 87* 88*  --  102*    Cardiac Enzymes: No results for input(s): CKTOTAL, CKMB, CKMBINDEX, TROPONINI in the last 168 hours.  BNP: Invalid input(s): POCBNP  CBG:  Recent Labs Lab 01/17/16 2134 01/18/16 0734 01/18/16 0736 01/18/16 0823 01/18/16 1128  GLUCAP 110* 58* 58* 84 116*    Microbiology: No results found for this or any previous visit.  Coagulation Studies:  Recent Labs  01/17/16 1030  LABPROT 19.8*  INR 1.66    Urinalysis: No results for input(s): COLORURINE, LABSPEC, PHURINE, GLUCOSEU, HGBUR, BILIRUBINUR, KETONESUR, PROTEINUR, UROBILINOGEN, NITRITE, LEUKOCYTESUR in the last 72 hours.  Invalid input(s): APPERANCEUR    Imaging: No results found.   Medications:   . sodium chloride 75 mL/hr at 01/18/16 0244  . [START ON 01/21/2016] sodium chloride     . [facility] Hold]  allopurinol  300 mg Oral Daily  . [MAR Hold] carvedilol  6.25 mg Oral BID WC  . clindamycin (CLEOCIN) IV  300 mg Intravenous Once  . [MAR Hold] insulin aspart  0-9 Units Subcutaneous TID WC  . [MAR Hold] insulin glargine  8 Units Subcutaneous QHS  . [MAR Hold] ketoconazole   Topical Daily  . [MAR Hold] pneumococcal 23 valent vaccine  0.5 mL Intramuscular Tomorrow-1000  . [MAR Hold] sodium bicarbonate  650 mg Oral BID   [MAR Hold] acetaminophen **OR** [MAR Hold] acetaminophen, [MAR Hold] HYDROcodone-acetaminophen, [MAR Hold] ondansetron **OR** [MAR Hold] ondansetron (ZOFRAN) IV  Assessment/ Plan:  [name] is a 82 y.o. white male with new onset lymphoma, hypertension, diabetes mellitus type II, hyperlipidemia, coronary artery disease, DVT on xarelto, BPH, RBB, who was admitted to [facility] on 01/15/2016 for tumor lysis syndrome renal insufficiency hyperuremia   1. Acute renal failure on chronic  kidney disease stage III: with hyperkalemia and hyponatremia: baseline creatinine of 1.6, eGFR of 42 from 12/31/15.  CT angiogram on 11/6. Poor PO intake: albumin low at 3.5 - Continue IV fluids at 2m/hr  2. Tumor lysis syndrome with hyperuremia: G6PD normal range - Continue IV fluids - Discussed role of dialysis in acute renal failure with patient and family.  3. Metabolic acidosis: from NS and acute renal failure - sodium bicarbonate PO   LOS: 3[place] Briana Newman 11/17/20173:39 PM

## 2016-01-18 NOTE — Progress Notes (Addendum)
[facility] Hematology/Oncology Progress Note  Date of admission: 01/15/2016  Hospital day:  01/17/2016  Chief Complaint: Hayden Martin is a 82 y.o. 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  Subjective:  Feels weak, but overall a little better.  Social History: The patient is accompanied by numerous family members today.  Allergies:  Allergies  Allergen Reactions  . Codeine Itching  . Penicillins Itching    Scheduled Medications: . allopurinol  300 mg Oral Daily  . carvedilol  6.25 mg Oral BID WC  . insulin aspart  0-9 Units Subcutaneous TID WC  . insulin glargine  15 Units Subcutaneous QHS  . ketoconazole   Topical Daily  . pneumococcal 23 valent vaccine  0.5 mL Intramuscular Tomorrow-1000  . sodium bicarbonate  650 mg Oral BID    Review of Systems: GENERAL: No energy. No fevers or sweats. Weight loss of 30-35 pounds in 2-3 months. PERFORMANCE STATUS (ECOG): 2 HEENT: No visual changes, runny nose, sore throat, mouth sores or tenderness. Lungs: No shortness of breath. Little non-productive cough. No hemoptysis. Cardiac: No chest pain, palpitations, orthopnea, or PND. GI: No appetite. No nausea, vomiting, diarrhea, constipation, or hematochezia. GU: Decreased urination. No urgency, frequency, dysuria, or hematuria. Musculoskeletal: Back issues. Joints hurt. No muscle tenderness. Extremities: No pain or swelling. Skin: Pruritus. No rashes or skin changes. Neuro: General weakness.  No headache, numbness or weakness, or coordination issues. Balance issues. Endocrine: Diabetes. No thyroid issues, hot flashes or night sweats. Psych: No mood changes, depression or anxiety. Pain: No focal pain. Review of systems:  All other systems reviewed and found to be negative.  Physical Exam: Blood pressure (!) 108/55, pulse 86, temperature 98.9 F (37.2 C), temperature source Oral, resp. rate 20, height 5'  11" (1.803 m), weight 203 lb 8 oz (92.3 kg), SpO2 91 %.  GENERAL:Elderly gentleman resting comfortably on the medical unit in no acute distress. MENTAL STATUS: Alert and oriented to person, place and time. HEAD:Gray hair. Male pattern baldness. Normocephalic, atraumatic, face symmetric, no Cushingoid features. EYES:Blueeyes. Pupils equal round and reactive to light and accomodation. No conjunctivitis or scleral icterus. ZOX:WRUEAVW aide. Oropharynx clear without lesion. Tonguenormal. Mucous membranes moist. RESPIRATORY: Clear to auscultationwithout rales, wheezes or rhonchi. CARDIOVASCULAR:Regular rate andrhythmwithout murmur, rub or gallop. ABDOMEN:Soft, non-tender, with active bowel sounds, and no hepatomegaly. Left upper quadrant fullness. No masses. SKIN: No rashes, ulcers or lesions. EXTREMITIES: Left lower extremity edema below the knee. Noskin discoloration or tenderness. No palpable cords. LYMPHNODES: No palpable cervical, supraclavicular, axillary or inguinal adenopathy  NEUROLOGICAL: Unremarkable. PSYCH: Appropriate.   Results for orders placed or performed during the hospital encounter of 01/15/16 (from the past 48 hour(s))  CBC     Status: Abnormal   Collection Time: 01/16/16  4:45 AM  Result Value Ref Range   WBC 4.3 3.8 - 10.6 K/uL   RBC 5.21 4.40 - 5.90 MIL/uL   Hemoglobin 13.5 13.0 - 18.0 g/dL   HCT 41.5 40.0 - 52.0 %   MCV 79.5 (L) 80.0 - 100.0 fL   MCH 25.9 (L) 26.0 - 34.0 pg   MCHC 32.5 32.0 - 36.0 g/dL   RDW 19.6 (H) 11.5 - 14.5 %   Platelets 92 (L) 150 - 440 K/uL  Basic metabolic panel     Status: Abnormal   Collection Time: 01/16/16  4:45 AM  Result Value Ref Range   Sodium 136 135 - 145 mmol/L   Potassium 5.1 3.5 - 5.1 mmol/L  Chloride 109 101 - 111 mmol/L   CO2 20 (L) 22 - 32 mmol/L   Glucose, Bld 145 (H) 65 - 99 mg/dL   BUN 34 (H) 6 - 20 mg/dL   Creatinine, Ser 1.92 (H) 0.61 - 1.24 mg/dL   Calcium 8.0 (L) 8.9 - 10.3  mg/dL   GFR calc non Af Amer 31 (L) >60 mL/min   GFR calc Af Amer 36 (L) >60 mL/min    Comment: (NOTE) The eGFR has been calculated using the CKD EPI equation. This calculation has not been validated in all clinical situations. eGFR's persistently <60 mL/min signify possible Chronic Kidney Disease.    Anion gap 7 5 - 15  Hepatitis B surface antigen     Status: None   Collection Time: 01/16/16  4:45 AM  Result Value Ref Range   Hepatitis B Surface Ag Negative Negative    Comment: (NOTE) Performed At: [facility] [address] [place], [place] [id number] Lindon Romp MD AT:[id number]   Hepatitis B core antibody, total     Status: None   Collection Time: 01/16/16  4:45 AM  Result Value Ref Range   Hep B Core Total Ab Negative Negative    Comment: (NOTE) Performed At: [facility] [place], [place] [id number] Lindon Romp MD [id number]   Hepatitis C antibody     Status: None   Collection Time: 01/16/16  4:45 AM  Result Value Ref Range   HCV Ab <0.1 0.0 - 0.9 s/co ratio    Comment: (NOTE)                                  Negative:     < 0.8                             Indeterminate: 0.8 - 0.9                                  Positive:     > 0.9 The CDC recommends that a positive HCV antibody result be followed up with a HCV Nucleic Acid Amplification test (710626). Performed At: [facility] [place], [place] [id number] Lindon Romp MD [id number]   Lactate dehydrogenase     Status: Abnormal   Collection Time: 01/16/16  4:45 AM  Result Value Ref Range   LDH 656 (H) 98 - 192 U/L  Phosphorus     Status: Abnormal   Collection Time: 01/16/16  4:45 AM  Result Value Ref Range   Phosphorus 2.4 (L) 2.5 - 4.6 mg/dL  Glucose, capillary     Status: Abnormal   Collection Time: 01/16/16  7:43 AM  Result Value Ref Range   Glucose-Capillary 126 (H) 65 - 99 mg/dL  Glucose, capillary     Status: Abnormal    Collection Time: 01/16/16 11:19 AM  Result Value Ref Range   Glucose-Capillary 143 (H) 65 - 99 mg/dL  Glucose, capillary     Status: Abnormal   Collection Time: 01/16/16 12:01 PM  Result Value Ref Range   Glucose-Capillary 122 (H) 65 - 99 mg/dL  Glucose, capillary     Status: Abnormal   Collection Time: 01/16/16  5:29 PM  Result Value Ref Range   Glucose-Capillary 129 (H) 65 - 99 mg/dL  Glucose, capillary     Status: Abnormal   Collection Time: 01/16/16  9:59 PM  Result Value Ref Range   Glucose-Capillary 137 (H) 65 - 99 mg/dL  Uric acid     Status: Abnormal   Collection Time: 01/17/16  4:49 AM  Result Value Ref Range   Uric Acid, Serum 8.8 (H) 4.4 - 7.6 mg/dL  Basic metabolic panel     Status: Abnormal   Collection Time: 01/17/16  4:49 AM  Result Value Ref Range   Sodium 136 135 - 145 mmol/L   Potassium 4.5 3.5 - 5.1 mmol/L   Chloride 110 101 - 111 mmol/L   CO2 20 (L) 22 - 32 mmol/L   Glucose, Bld 110 (H) 65 - 99 mg/dL   BUN 27 (H) 6 - 20 mg/dL   Creatinine, Ser 1.68 (H) 0.61 - 1.24 mg/dL   Calcium 7.8 (L) 8.9 - 10.3 mg/dL   GFR calc non Af Amer 36 (L) >60 mL/min   GFR calc Af Amer 42 (L) >60 mL/min    Comment: (NOTE) The eGFR has been calculated using the CKD EPI equation. This calculation has not been validated in all clinical situations. eGFR's persistently <60 mL/min signify possible Chronic Kidney Disease.    Anion gap 6 5 - 15  Magnesium     Status: Abnormal   Collection Time: 01/17/16  4:49 AM  Result Value Ref Range   Magnesium 1.5 (L) 1.7 - 2.4 mg/dL  Phosphorus     Status: Abnormal   Collection Time: 01/17/16  4:49 AM  Result Value Ref Range   Phosphorus 2.4 (L) 2.5 - 4.6 mg/dL  Platelet count     Status: Abnormal   Collection Time: 01/17/16  4:49 AM  Result Value Ref Range   Platelets 89 (L) 150 - 440 K/uL  Glucose, capillary     Status: None   Collection Time: 01/17/16  7:28 AM  Result Value Ref Range   Glucose-Capillary 94 65 - 99 mg/dL  CBC      Status: Abnormal   Collection Time: 01/17/16 10:30 AM  Result Value Ref Range   WBC 3.8 3.8 - 10.6 K/uL   RBC 4.91 4.40 - 5.90 MIL/uL   Hemoglobin 12.6 (L) 13.0 - 18.0 g/dL   HCT 38.5 (L) 40.0 - 52.0 %   MCV 78.4 (L) 80.0 - 100.0 fL   MCH 25.7 (L) 26.0 - 34.0 pg   MCHC 32.7 32.0 - 36.0 g/dL   RDW 20.1 (H) 11.5 - 14.5 %   Platelets 87 (L) 150 - 440 K/uL  Protime-INR     Status: Abnormal   Collection Time: 01/17/16 10:30 AM  Result Value Ref Range   Prothrombin Time 19.8 (H) 11.4 - 15.2 seconds   INR 1.66   APTT     Status: None   Collection Time: 01/17/16 10:30 AM  Result Value Ref Range   aPTT 34 24 - 36 seconds  Heparin level (unfractionated)     Status: Abnormal   Collection Time: 01/17/16 10:30 AM  Result Value Ref Range   Heparin Unfractionated 1.82 (H) 0.30 - 0.70 IU/mL    Comment:        IF HEPARIN RESULTS ARE BELOW EXPECTED VALUES, AND PATIENT DOSAGE HAS BEEN CONFIRMED, SUGGEST FOLLOW UP TESTING OF ANTITHROMBIN III LEVELS.   Glucose, capillary     Status: Abnormal   Collection Time: 01/17/16 11:37 AM  Result Value Ref Range   Glucose-Capillary 113 (H) 65 - 99 mg/dL  Glucose, capillary     Status: Abnormal   Collection Time: 01/17/16  5:04 PM  Result Value Ref Range   Glucose-Capillary 104 (H) 65 - 99 mg/dL  APTT     Status: Abnormal   Collection Time: 01/17/16  6:27 PM  Result Value Ref Range   aPTT 137 (H) 24 - 36 seconds    Comment:        IF BASELINE aPTT IS ELEVATED, SUGGEST PATIENT RISK ASSESSMENT BE USED TO DETERMINE APPROPRIATE ANTICOAGULANT THERAPY.   Glucose, capillary     Status: Abnormal   Collection Time: 01/17/16  9:34 PM  Result Value Ref Range   Glucose-Capillary 110 (H) 65 - 99 mg/dL   Nm Pet Image Initial (pi) Skull Base To Thigh  Result Date: 01/16/2016 CLINICAL DATA:  Initial treatment strategy for lymphoma. EXAM: NUCLEAR MEDICINE PET SKULL BASE TO THIGH TECHNIQUE: 12.0 mCi F-18 FDG was injected intravenously. Full-ring PET imaging  was performed from the skull base to thigh after the radiotracer. CT data was obtained and used for attenuation correction and anatomic localization. FASTING BLOOD GLUCOSE:  Value: 122 mg/dl COMPARISON:  None. FINDINGS: NECK Left posterior cervical nodes are identified, appear enlarged and hypermetabolic. Index node measures 1.2 cm and has an SUV max equal to 8.2, image 52 of series 3. Hypermetabolic left supraclavicular lymph node is identified. This measures 1.2 cm and has an SUV max equal to 10.68. CHEST Mild increased FDG uptake within the left hilar region has an SUV max equal to 4.4. Within the posterior mediastinum there is an enlarged lymph node between the esophagus and aorta. This measures 1.6 cm and has an SUV max equal to 15.2, image 100 of series 3. Enlarged and hypermetabolic left cardio phrenic angle lymph node measures 1.3 cm and has an SUV max equal to 9.2, image 124 of series 3. Bilateral pleural effusions are identified left-greater-than-right. No hypermetabolic pulmonary nodule or mass identified. ABDOMEN/PELVIS There is extensive hypermetabolic adenopathy within the upper abdomen. Periaortic nodal mass measures 3.4 cm and has an SUV max equal to 14.6, image 174 of series 3. Hypermetabolic enlarged retrocaval node measures 2.1 cm and has an SUV max equal to 15.0. Extensive hypermetabolic adenopathy within the mesenteric noted. Index jejunal node measures 2 cm and has an SUV max equal to 12.95, image 169 of series 3. Enlarged and hypermetabolic left external iliac lymph node measures 1.5 cm and has an SUV max equal to 15.78, image 232 of series 3. Extensive hypermetabolic tumor is identified throughout the spleen. The spleen measures 13 cm in length. Hypermetabolic lesion within the spleen measures 10.5 cm and has an SUV max equal to 13.02. There is evidence of trans peritoneal spread of hypermetabolic tumor within the abdomen. For example, within the left lower quadrant of the abdomen there is  soft tissue stranding, fluid and nodularity along the peritoneal reflection. SUV max equals 5.07, image 219 of series 3. Ascites is noted extending over the right lobe of liver. SKELETON No focal hypermetabolic activity to suggest skeletal metastasis. IMPRESSION: 1. Examination is positive for extensive hypermetabolic adenopathy within the neck, chest, abdomen and pelvis compatible with the clinical history of lymphoma. 2. The spleen is enlarged and there is multi focal hypermetabolic splenic lesions. 3. Evidence of trans peritoneal spread of tumor within the upper abdomen. 4. Bilateral pleural effusions. 5. Aortic atherosclerosis and multi vessel coronary artery calcification. Electronically Signed   By: Kerby Moors M.D.   On: 01/16/2016 14:23    Assessment:  Hayden Martin is a 82 y.o. 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    PET scan on 01/16/2016 revealed extensive hypermetabolic adenopathy within the neck, chest, abdomen and pelvis.  The spleen was enlarged with  multi focal hypermetabolic splenic lesions  There was evidence of trans peritoneal spread of tumor within the upper abdomen.  There were bilateral pleural effusions.  He has a history of left lower extremity thrombosis (2014). Bilateral lower extremity duplex on 01/16/2016 revealed DVT in the left lower extremity involving the calf veins and the left popliteal vein.  Clot burden greater than in 2014 suggesting acute thrombus.  He received Eliquis on 01/16/2016.  He is on heparin.  He has acute on chronic renal insufficiency secondary to tumor lysis.  Baseline creatinine is 1.3- 1.4.  Creatinine was 2.34 on admission with a uric acid of  12.7.  Creatinine has improved to 1.68.  Uric acid has improved to 8.8.  He has an enlarged prostate and an elevated PSA.  Symptomatically, he feels a little better today.  Plan:   1.  Oncology:  Discussed PET  scan with family today.  Images reviewed.  Patient likely has stage IV lymphoma.  Patient discussed at tumor board on 01/17/2016.  Discussed plans for biopsy of retroperitoneal lymph node and bone marrow biopsy on 01/21/2016 (biopsy delayed secondary to Eliquis taken on 01/16/2016).  Orders placed.  Pathology to expedite evaluation in order to treat as inpatient on 11/21 or 11/22 (prior to Thanksgiving holiday).  Side effects of planned treatment re-reviewed.  Hepatitis serologies negative (in anticipation of Rituxan).  Consult vascular surgery for port placement.  Echocardiogram tomorrow (ensure EF adequate for anthracycline).  2.  Hematology:  Patient has an acute thrombus in left lower extremity.  Eliquis switched to heparin to allow infusion to be held prior to upcoming biopsies on 01/21/2016.  Discuss consideration of IVC filter (consult vascular surgery).    Thrombocytopenia felt secondary to splenomegaly and possibly marrow disease.  Bone marrow biopsy on 01/21/2016.  3.  Nephrology:  Tumor lysis syndrome on IVF, alkalinization, and allopurinol.  Check BMP, uric acid, phosphorus daily.  G6PD testing negative (ok to give rasburicase if needed).  Appreciate nephrology consult.  4.  Disposition:  Patient will remain hospitalized until treatment (and a few days after) to manage tumor lysis.  Once stability documented, patient can be discharged.  5.  Code status:  Discussed with patient and family.  Full code.   Lequita Asal, MD  01/17/2016

## 2016-01-18 NOTE — Progress Notes (Signed)
  Echocardiogram 2D Echocardiogram with definity has been performed.  Hayden Martin M 01/18/2016, 8:31 AM

## 2016-01-18 NOTE — Progress Notes (Addendum)
ANTICOAGULATION CONSULT NOTE - Initial Consult  Pharmacy Consult for heparin drip Indication: DVT  Allergies  Allergen Reactions  . Codeine Itching  . Penicillins Itching    Patient Measurements: Height: 5\' 11"  (180.3 cm) Weight: 203 lb 8 oz (92.3 kg) IBW/kg (Calculated) : 75.3 Heparin Dosing Weight: 92 kg  Vital Signs: Temp: 98.2 F (36.8 C) (11/17 1349) Temp Source: Oral (11/17 1349) BP: 116/54 (11/17 1349) Pulse Rate: 80 (11/17 1349)  Labs:  Recent Labs  01/16/16 0445 01/17/16 0449  01/17/16 1030 01/17/16 1827 01/18/16 0346 01/18/16 0943 01/18/16 1320  HGB 13.5  --   --  12.6*  --   --  13.0 13.3  HCT 41.5  --   --  38.5*  --   --   --  41.7  PLT 92* 89*  --  87*  --  88*  --  102*  APTT  --   --   < > 34 137* >160*  --  96*  LABPROT  --   --   --  19.8*  --   --   --   --   INR  --   --   --  1.66  --   --   --   --   HEPARINUNFRC  --   --   --  1.82*  --  1.37*  --   --   CREATININE 1.92* 1.68*  --   --   --  1.60*  1.55*  --   --   < > = values in this interval not displayed.  Estimated Creatinine Clearance: 41.3 mL/min (by C-G formula based on SCr of 1.6 mg/dL (H)).   Medical History: Past Medical History:  Diagnosis Date  . Clotting disorder ([place])   . Diabetes mellitus without complication ([place])   . Heart disease   . Hypertension    Medications:  Scheduled:  . allopurinol  300 mg Oral Daily  . carvedilol  6.25 mg Oral BID WC  . clindamycin      . insulin aspart  0-9 Units Subcutaneous TID WC  . insulin glargine  8 Units Subcutaneous QHS  . ketoconazole   Topical Daily  . pneumococcal 23 valent vaccine  0.5 mL Intramuscular Tomorrow-1000  . sodium bicarbonate  650 mg Oral BID   Assessment: Pharmacy consulted to dose and monitor heparin drip in this 82 year old male admitted for tumor lysis syndrome and diagnosed with an acute lower extremity DVT.   Patient was taking rivaroxaban prior to admission for a history of DVT and was converted to  apixaban on admission due to ARF and CrCl < 30 mL/min. Patient has received 2 doses of apixaban during admission - last dose at 2206 on 11/15.   Baseline labs (CBC, INR, APTT, and heparin level) were obtained  Heparin drip will need to be monitored/adjusted using APTT since patient has been prescribed NOACs. Once APTT and heparin level correlate, can monitor using heparin level.  Goal of Therapy:  Heparin level 0.3-0.7 units/ml aPTT 66 - 102 seconds Monitor platelets by anticoagulation protocol: Yes   Plan:  11/17 @ 1320 APTT = 96 seconds (therapeutic) on a rate of 1100 units/hr.  Heparin drip order was discontinued at 1409 today - patient is currently off of the floor. Spoke with RN who reports heparin was stopped when patient went for IVC filter placement.   11/17 1820 MD consult to restart Heparin drip. Will restart at 1100 units/hr. Will check aPTT/ Heparin  level in 8 hrs. F/u aPTT and HL till they correlate (HL just once daily).  11/18 AM aPTT 85 and heparin level 0.58. Since levels appear to be correlating, will follow by heparin level only. Next labs tomorrow AM.    Noralee Space, PharmD  Clinical Pharmacist 01/18/2016,6:23 PM

## 2016-01-18 NOTE — Progress Notes (Signed)
[facility] NAME: Hayden Martin    MR#:  [id number]  DATE OF BIRTH:  1931-10-10  SUBJECTIVE:  CHIEF COMPLAINT:  No chief complaint on file. The patient is a 82 year old Caucasian male with past medical history significant for history of suspected lymphoma, DVT, diabetes, essential hypertension, who presents to the hospital with complaints of elevated LDH, worsening renal failure, elevated uric acid level. He was admitted from oncology office as directed. Admit. He was complaining of feeling weak and tired and not being able to walk. Labs revealed creatinine of 2.34, hyperkalemia, potassium of 5.5, hyponatremia, sodium 133, elevated uric acid level to 12.7, patient was rehydrated, his creatinine improved to 1.92. Today. The patient was seen by oncologist and nephrologist, recommended to continue IV fluids, discussed the role of dialysis and acute renal failure this patient and family. He was initiated on her sodium bicarbonate because of acidosis. PET scan revealed lymphadenopathy in neck, chest, abdomen and pelvis, enlarged spleen, consistent with clinical diagnosis is lymphoma. Patient feels satisfactory today, denies any discomfort. Physical therapist recommended skilled nursing facility placement. Patient was seen by vascular surgeon and recommended IVC filter placement in view of bone marrow and lymph node biopsy and Port-A-Cath placement in the nearest future . Heparin as well as Eliquis are discontinued this point  Review of Systems  Constitutional: Negative for chills, fever and weight loss.  HENT: Negative for congestion.   Eyes: Negative for blurred vision and double vision.  Respiratory: Negative for cough, sputum production, shortness of breath and wheezing.   Cardiovascular: Negative for chest pain, palpitations, orthopnea, leg swelling and PND.  Gastrointestinal: Negative for abdominal pain, blood in stool, constipation,  diarrhea, nausea and vomiting.  Genitourinary: Negative for dysuria, frequency, hematuria and urgency.  Musculoskeletal: Negative for falls.  Neurological: Positive for weakness. Negative for dizziness, tremors, focal weakness and headaches.  Endo/Heme/Allergies: Does not bruise/bleed easily.  Psychiatric/Behavioral: Negative for depression. The patient does not have insomnia.     VITAL SIGNS: Blood pressure (!) 116/54, pulse 80, temperature 98.2 F (36.8 C), temperature source Oral, resp. rate 16, height 5' 11"  (1.803 m), weight 92.3 kg (203 lb 8 oz), SpO2 93 %.  PHYSICAL EXAMINATION:   GENERAL:  82 y.o.-year-old patient lying in the bed with no acute distress. Comfortable, in better mood  EYES: Pupils equal, round, reactive to light and accommodation. No scleral icterus. Extraocular muscles intact.  HEENT: Head atraumatic, normocephalic. Oropharynx and nasopharynx clear.  NECK:  Supple, no jugular venous distention. No thyroid enlargement, no tenderness.  LUNGS: Normal breath sounds bilaterally, no wheezing, rales,rhonchi or crepitation. No use of accessory muscles of respiration.  CARDIOVASCULAR: S1, S2 normal. No murmurs, rubs, or gallops.  ABDOMEN: Soft, nontender, nondistended. Bowel sounds present. No organomegaly or mass.  EXTREMITIES: No pedal edema, cyanosis, or clubbing. Left lower extremity edema below the knee NEUROLOGIC: Cranial nerves II through XII are intact. Muscle strength 5/5 in all extremities. Sensation intact. Gait not checked.  PSYCHIATRIC: The patient is alert and oriented x 3.  SKIN: No obvious rash, lesion, or ulcer.   ORDERS/RESULTS REVIEWED:   CBC  Recent Labs Lab 01/14/16 1619 01/16/16 0445 01/17/16 0449 01/17/16 1030 01/18/16 0346 01/18/16 0943 01/18/16 1320  WBC 6.2 4.3  --  3.8  --   --  3.8  HGB 15.6 13.5  --  12.6*  --  13.0 13.3  HCT 48.1 41.5  --  38.5*  --   --  41.7  PLT 99* 92* 89* 87* 88*  --  102*  MCV 77.8* 79.5*  --  78.4*  --   --   79.0*  MCH 25.2* 25.9*  --  25.7*  --   --  25.3*  MCHC 32.4 32.5  --  32.7  --   --  32.0  RDW 19.0* 19.6*  --  20.1*  --   --  19.6*  LYMPHSABS 0.7*  --   --   --   --   --   --   MONOABS 0.8  --   --   --   --   --   --   EOSABS 0.2  --   --   --   --   --   --   BASOSABS 0.1  --   --   --   --   --   --    ------------------------------------------------------------------------------------------------------------------  Chemistries   Recent Labs Lab 01/14/16 1619 01/16/16 0445 01/17/16 0449 01/18/16 0346  NA 133* 136 136 135  K 5.5* 5.1 4.5 4.5  CL 102 109 110 108  CO2 22 20* 20* 19*  GLUCOSE 198* 145* 110* 69  BUN 46* 34* 27* 22*  CREATININE 2.34* 1.92* 1.68* 1.60*  1.55*  CALCIUM 8.9 8.0* 7.8* 7.7*  MG  --   --  1.5* 1.8  AST 37  --   --   --   ALT 29  --   --   --   ALKPHOS 47  --   --   --   BILITOT 0.9  --   --   --    ------------------------------------------------------------------------------------------------------------------ estimated creatinine clearance is 41.3 mL/min (by C-G formula based on SCr of 1.6 mg/dL (H)). ------------------------------------------------------------------------------------------------------------------ No results for input(s): TSH, T4TOTAL, T3FREE, THYROIDAB in the last 72 hours.  Invalid input(s): FREET3  Cardiac Enzymes No results for input(s): CKMB, TROPONINI, MYOGLOBIN in the last 168 hours.  Invalid input(s): CK ------------------------------------------------------------------------------------------------------------------ Invalid input(s): POCBNP ---------------------------------------------------------------------------------------------------------------  RADIOLOGY: No results found.  EKG:  Orders placed or performed in visit on 08/27/06  . EKG 12-Lead    ASSESSMENT AND PLAN:  Active Problems:   Tumor lysis syndrome #1. Acute renal failure due to tumor lysis syndrome, Creatinine has improved from 2.34,  GFR of 24 to 1.60, GFR 38, but now, continue IV fluids, appreciate nephrologist input, kidney function is improving, follow creatinine in the morning #2 metabolic acidosis, continue  bicarbonate orally, bicarbonate level remains low at 19, follow in the morning #3. Hyperkalemia, improved with hydration, bicarbonate #4. Hyponatremia, resolved with IV hydration #5, hyperuricemia, improving with IV hydration #6. Suspected lymphoma, PET scan reveals lymphadenopathy in neck, chest, abdomen and pelvis, splenomegaly, consistent with clinical diagnosis of lymphoma, per radiologist, oncologist. Dr. Mike Gip is to discuss with interventional radiologist the best site for biopsy , patient is to undergo lymph node and bone marrow biopsy on Monday 20th of November 2017 and Port-A-Cath placement. #7, acute left lower extremity DVT, now off heparin or Eliquis , vascular surgery is recommended IVC filter placement today.  Management plans discussed with the patient, family and they are in agreement.   DRUG ALLERGIES:  Allergies  Allergen Reactions  . Codeine Itching  . Penicillins Itching    CODE STATUS:     Code Status Orders        Start     Ordered   01/15/16 1512  Full code  Continuous     01/15/16 1511  Code Status History    Date Active Date Inactive Code Status Order ID Comments User Context   This patient has a current code status but no historical code status.    Advance Directive Documentation   Alex[facility] Most Recent Value  Type of Advance Directive  Living will  Pre-existing out of facility DNR order (yellow form or pink MOST form)  No data  "MOST" Form in Place?  No data      TOTAL TIME TAKING CARE OF THIS PATIENT: 30 minutes.    Theodoro Grist M.D on 01/18/2016 at 3:34 PM  Between 7am to 6pm - Pager - [phone]  After 6pm go to [email or web address] - password [email or web address] [facility]  Office  [phone]  CC: Primary care physician; Kirk Ruths., MD

## 2016-01-18 NOTE — Care Management (Signed)
Discussed home health and physical therapy agencies. Shannon. Will update Floydene Flock, [facility] representative. Shelbie Ammons RN MSN CCM Care Management

## 2016-01-18 NOTE — Progress Notes (Signed)
Patient is walking over 234ft and recommendation is for Home Health PT.   At this time he will not qualify for SNF due to recommendation and insurance authorization.  Patient will need to discharge home with home health services and LCSW will notify CM.  Lane Hacker, MSW Clinical Social Work: [employer] Coverage for :  [phone]

## 2016-01-18 NOTE — Op Note (Signed)
[facility]   OPERATIVE NOTE    PRE-OPERATIVE DIAGNOSIS: DVT with PE  POST-OPERATIVE DIAGNOSIS: Same  PROCEDURE: 1.   Ultrasound guidance for vascular access to the right common femoral vein 2.   Catheter placement into the inferior vena cava 3.   Inferior venacavogram 4.   Placement of a Celect IVC filter  SURGEON: Hortencia Pilar  ASSISTANT(S): None  ANESTHESIA: IV Versed only   ESTIMATED BLOOD LOSS: minimal  FINDING(S): 1.  Patent IVC  SPECIMEN(S):  none  INDICATIONS:   Hayden Martin is a 82 y.o. y.o. male who presents with thrombocytopenia left leg DVT and the need for several invasive procedures..  Inferior vena cava filter is indicated for this reason.  Risks and benefits including filter thrombosis, migration, fracture, bleeding, and infection were all discussed.  We discussed that all IVC filters that we place can be removed if desired from the patient once the need for the filter has passed.    DESCRIPTION: After obtaining full informed written consent, the patient was brought back to the vascular suite. The skin was sterilely prepped and draped in a sterile surgical field was created. The right groin was accessed under direct ultrasound guidance without difficulty with a Seldinger needle and a J-wire was then placed. The dilator is passed over the wire and the delivery sheath was placed into the inferior vena cava.  Inferior venacavogram was performed. This demonstrated a patent IVC with the level of the renal veins at L1.  The filter was then deployed into the inferior vena cava at the level of L2 just below the renal veins. The delivery sheath was then removed. Pressure was held. Sterile dressings were placed. The patient tolerated the procedure well and was taken to the recovery room in stable condition.  Interpretation: IVC widely patent renal blushes at L1 vena cava measures 18 mm in diameter  COMPLICATIONS: None  CONDITION:  Stable  Hortencia Pilar  01/18/2016, 4:28 PM

## 2016-01-18 NOTE — Consult Note (Signed)
[facility] Vascular Consult Note  MRN : [id number]  Hayden Martin is a 82 y.o. (15-Sep-1931) male who presents with chief complaint of left leg DVT.  History of Present Illness: I am asked to see Hayden Martin by Dr.Vaickute for evaluation of left leg DVT.  The patient  is an 82 y.o. male with a known history of  DVT, diabetes, hypertension who is brought into the hospital 3 days ago due to worsening renal failure, elevated LDH level, and elevated uric acid level who was seen by oncology first time in November 13. Patient previously was noted to have weight loss and abnormal blood work and abdominal CT revealed multiple solid masses within the spleen as well as extensive lymphadenopathy. Patient was planned to have a PET scan and a biopsy of the tumor. Who went for follow-up and had repeated blood work which showed his has elevated creatinine and his uric acid level is high and LDH is high as well.  Nephrology has seen the patient for his renal insufficiency and has been aggressively hydrating him. PET scan showed diffuse lymphadenopathy consistent with lymphoma.  On the day of admission the patient also had a duplex ultrasound of the venous system which demonstrated DVT in the left lower extremity. He had been on Ellik was but was now changed to heparin. He will require biopsy of his mass and therefore cessation of anticoagulation therapy he also has developed worsening thrombocytopenia. I asked to evaluate for IVC filter placement. As a secondary issue he will also require chemotherapy and therefore Infuse-a-Port.  Current Facility-Administered Medications  Medication Dose Route Frequency Provider Last Rate Last Dose  . 0.9 %  sodium chloride infusion   Intravenous Continuous Lavonia Dana, MD 75 mL/hr at 01/18/16 0244    . [START ON 01/21/2016] 0.9 %  sodium chloride infusion   Intravenous Continuous Lequita Asal, MD      . Doug Sou Hold] acetaminophen (TYLENOL) tablet 650  mg  650 mg Oral Q6H PRN Dustin Flock, MD       Or  . Doug Sou Hold] acetaminophen (TYLENOL) suppository 650 mg  650 mg Rectal Q6H PRN Dustin Flock, MD      . Doug Sou Hold] allopurinol (ZYLOPRIM) tablet 300 mg  300 mg Oral Daily Lequita Asal, MD   300 mg at 01/18/16 0925  . [MAR Hold] carvedilol (COREG) tablet 6.25 mg  6.25 mg Oral BID WC Dustin Flock, MD   6.25 mg at 01/18/16 0925  . [MAR Hold] HYDROcodone-acetaminophen (NORCO/VICODIN) 5-325 MG per tablet 1-2 tablet  1-2 tablet Oral Q4H PRN Dustin Flock, MD      . Doug Sou Hold] insulin aspart (novoLOG) injection 0-9 Units  0-9 Units Subcutaneous TID WC Dustin Flock, MD   1 Units at 01/16/16 1802  . [MAR Hold] insulin glargine (LANTUS) injection 8 Units  8 Units Subcutaneous QHS Theodoro Grist, MD      . Doug Sou Hold] ketoconazole (NIZORAL) 2 % cream   Topical Daily Dustin Flock, MD      . Doug Sou Hold] ondansetron [facility]) tablet 4 mg  4 mg Oral Q6H PRN Dustin Flock, MD       Or  . Doug Sou Hold] ondansetron (ZOFRAN) injection 4 mg  4 mg Intravenous Q6H PRN Dustin Flock, MD      . Doug Sou Hold] pneumococcal 23 valent vaccine (PNU-IMMUNE) injection 0.5 mL  0.5 mL Intramuscular Tomorrow-1000 Dustin Flock, MD      . Doug Sou Hold] sodium bicarbonate tablet 650 mg  650 mg Oral BID Theodoro Grist, MD   650 mg at 01/18/16 8299    Past Medical History:  Diagnosis Date  . Clotting disorder ([place])   . Diabetes mellitus without complication ([place])   . Heart disease   . Hypertension     Past Surgical History:  Procedure Laterality Date  . HERNIA REPAIR      Social History Social History  Substance Use Topics  . Smoking status: Former Smoker    Packs/day: 1.50    Quit date: 01/01/1986  . Smokeless tobacco: Former Systems developer     Comment: Smoked for 20 years  . Alcohol use Not on file    Family History History reviewed. No pertinent family history. No family history of bleeding clotting disorders porphyria or autoimmune disease  Allergies  Allergen  Reactions  . Codeine Itching  . Penicillins Itching     REVIEW OF SYSTEMS (Negative unless checked)  Constitutional: [x] Weight loss  [] Fever  [x] Chills Cardiac: [] Chest pain   [] Chest pressure   [] Palpitations   [] Shortness of breath when laying flat   [] Shortness of breath at rest   [x] Shortness of breath with exertion. Vascular:  [] Pain in legs with walking   [] Pain in legs at rest   [] Pain in legs when laying flat   [] Claudication   [] Pain in feet when walking  [] Pain in feet at rest  [] Pain in feet when laying flat   [x] History of DVT   [] Phlebitis   [x] Swelling in legs   [] Varicose veins   [] Non-healing ulcers Pulmonary:   [] Uses home oxygen   [] Productive cough   [] Hemoptysis   [] Wheeze  [] COPD   [] Asthma Neurologic:  [] Dizziness  [] Blackouts   [] Seizures   [] History of stroke   [] History of TIA  [] Aphasia   [] Temporary blindness   [] Dysphagia   [] Weakness or numbness in arms   [] Weakness or numbness in legs Musculoskeletal:  [] Arthritis   [] Joint swelling   [] Joint pain   [] Low back pain Hematologic:  [] Easy bruising  [] Easy bleeding   [] Hypercoagulable state   [] Anemic  [] Hepatitis Gastrointestinal:  [] Blood in stool   [] Vomiting blood  [] Gastroesophageal reflux/heartburn   [] Difficulty swallowing. Genitourinary:  [x] Chronic kidney disease   [] Difficult urination  [] Frequent urination  [] Burning with urination   [] Blood in urine Skin:  [] Rashes   [] Ulcers   [] Wounds Psychological:  [] History of anxiety   []  History of major depression.  Physical Examination  Vitals:   01/17/16 1301 01/17/16 2018 01/18/16 0716 01/18/16 1349  BP: (!) 107/53 (!) 108/55 (!) 121/57 (!) 116/54  Pulse: 78 86 84 80  Resp: 19 20 18 16   Temp: 98.4 F (36.9 C) 98.9 F (37.2 C)  98.2 F (36.8 C)  TempSrc: Oral Oral  Oral  SpO2: 96% 91% 93% 93%  Weight:      Height:       Body mass index is 28.38 kg/m. Gen:  WD/WN, NAD Head: [place]/AT, No temporalis wasting. Prominent temp pulse not  noted. Ear/Nose/Throat: Hearing grossly intact, nares w/o erythema or drainage, oropharynx w/o Erythema/Exudate Eyes: Sclera non-icteric, conjunctiva clear Neck: Trachea midline.  No JVD.  Pulmonary:  Good air movement, respirations not labored, equal bilaterally.  Cardiac: RRR, normal S1, S2. Vascular: Left leg with 2+ edema Vessel Right Left  Radial Palpable Palpable  Ulnar Palpable Palpable  Brachial Palpable Palpable  Carotid Palpable, without bruit Palpable, without bruit  Aorta Not palpable N/A  Femoral Palpable Palpable  Gastrointestinal: soft, non-tender/non-distended. No guarding/reflex.  Musculoskeletal: M/S 5/5 throughout.  Extremities without ischemic changes.  No deformity or atrophy. No edema. Neurologic: Sensation grossly intact in extremities.  Symmetrical.  Speech is fluent. Motor exam as listed above. Psychiatric: Judgment intact, Mood & affect appropriate for pt's clinical situation. Dermatologic: No rashes or ulcers noted.  No cellulitis or open wounds. Lymph : No Cervical, Axillary, or Inguinal lymphadenopathy.      CBC Lab Results  Component Value Date   WBC 3.8 01/18/2016   HGB 13.3 01/18/2016   HCT 41.7 01/18/2016   MCV 79.0 (L) 01/18/2016   PLT 102 (L) 01/18/2016    BMET    Component Value Date/Time   NA 135 01/18/2016 0346   K 4.5 01/18/2016 0346   CL 108 01/18/2016 0346   CO2 19 (L) 01/18/2016 0346   GLUCOSE 69 01/18/2016 0346   BUN 22 (H) 01/18/2016 0346   CREATININE 1.60 (H) 01/18/2016 0346   CREATININE 1.55 (H) 01/18/2016 0346   CALCIUM 7.7 (L) 01/18/2016 0346   GFRNONAA 38 (L) 01/18/2016 0346   GFRNONAA 40 (L) 01/18/2016 0346   GFRAA 45 (L) 01/18/2016 0346   GFRAA 46 (L) 01/18/2016 0346   Estimated Creatinine Clearance: 41.3 mL/min (by C-G formula based on SCr of 1.6 mg/dL (H)).  COAG Lab Results  Component Value Date   INR 1.66 01/17/2016    Radiology Ct Abdomen Pelvis W Contrast  Result Date: 01/08/2016 CLINICAL DATA:   Diffuse abdominal pain over the last 2 weeks. Unable to eat. EXAM: CT ABDOMEN AND PELVIS WITH CONTRAST TECHNIQUE: Multidetector CT imaging of the abdomen and pelvis was performed using the standard protocol following bolus administration of intravenous contrast. CONTRAST:  14m ISOVUE-300 IOPAMIDOL (ISOVUE-300) INJECTION 61% COMPARISON:  None. FINDINGS: Lower chest: There is a left pleural effusion layering dependently. Pleural irregularity on the left could relate to pleural metastatic disease. No pulmonary parenchymal lesion is seen in the lower chest other than volume loss in the left lower lobe. Hepatobiliary: No liver mass is identified. No calcified gallstones. There are markedly enlarged lymph nodes in the porta hepatis. Pancreas: No primary pancreatic lesion is seen. There is a duodenum diverticulum in the region the pancreatic head. There are numerous. Pancreatic lymph nodes. Spleen: The spleen is enlarged and contains multiple large low-density areas consistent with splenic metastatic disease or primary lymphoma. Adrenals/Urinary Tract: Adrenal glands are normal. Kidneys are normal. No cyst, mass, stone or hydronephrosis. Excretion appears delayed. The bladder appears thick walled. The prostate gland is markedly enlarged, up to a cm in diameter. Stomach/Bowel: No acute or primary bowel pathology is seen. Vascular/Lymphatic: As noted above, there is massive lymphadenopathy throughout the abdominal retroperitoneum. Large confluent lymph nodes are present surrounding the aorta and IVC, with any retrocrural region, in the portal region, surrounding the pancreas, scattered within the proximal mesenteries and at the gastroesophageal junction. Index nodes include the following. Retrocrural node on image 10 measuring 2.1 x 2.0 cm. Periportal node on image 32 measuring 2.6 x 5.8 cm. Confluent nodal mass is in the retroperitoneum surrounding the great vessels measuring 5.6 x 3.8 cm on the left on image 41 and 5.3 x  2.9 cm on the right on image 41. Notably, left iliac node image 73 measures 1.7 x 2.6 cm. There is atherosclerosis of the aorta but no aneurysm. Reproductive: Markedly enlarged prostate, 8 cm in diameter as noted above. Other: Small amount of freely distributed ascites. Small periumbilical hernia containing only fat. Musculoskeletal: No definable sclerotic or lytic bone lesions. IMPRESSION:  This patient has either a new presentation of lymphoma or advanced metastatic carcinoma. There are multiple solid masses within the spleen. There is extensive retroperitoneal, portal and mesenteric lymphadenopathy. There is possibly pleural disease on the left. In addition to lymphoma, prostate cancer is possible given that the prostate gland is markedly enlarged. However, most of the lymphadenopathy is abdominal, with only a single enlarged left iliac chain node. Notable absence of definable hepatic metastatic disease. Aortic atherosclerosis. Periumbilical hernia containing only fat. Electronically Signed   By: Nelson Chimes M.D.   On: 01/08/2016 06:44   US Renal  Result Date: 01/15/2016 CLINICAL DATA:  Acute renal failure syndrome, chronic renal disease stage III EXAM: RENAL / URINARY TRACT ULTRASOUND COMPLETE COMPARISON:  CT from 01/07/2016 FINDINGS: Right Kidney: Length: 11.1 cm. Echogenicity within normal limits. No mass or hydronephrosis visualized. Left Kidney: Length: 11.7 cm. Echogenicity within normal limits. No mass or hydronephrosis visualized. Bladder: The bladder is contracted which may account the mild thickened appearance of the bladder wall up to 5 mm. No calculus or focal mass. Incidental note is made of postop megaly with the prostate measuring approximately 9.1 x 7.7 x 7.3 cm in craniocaudad by AP by transverse dimension. Heterogeneous ill-defined masses of the spleen mass seen on CT. The spleen measures approximately 15 x 8.4 cm. IMPRESSION: Kidneys and bladder are without acute appearing abnormality.  Heterogeneous solid masslike abnormalities of the large spleen as noted on recent CT may reflect lymphoma/leukemia. There is prostatomegaly as well. Electronically Signed   By: Ashley Royalty M.D.   On: 01/15/2016 17:45   Nm Pet Image Initial (pi) Skull Base To Thigh  Result Date: 01/16/2016 CLINICAL DATA:  Initial treatment strategy for lymphoma. EXAM: NUCLEAR MEDICINE PET SKULL BASE TO THIGH TECHNIQUE: 12.0 mCi F-18 FDG was injected intravenously. Full-ring PET imaging was performed from the skull base to thigh after the radiotracer. CT data was obtained and used for attenuation correction and anatomic localization. FASTING BLOOD GLUCOSE:  Value: 122 mg/dl COMPARISON:  None. FINDINGS: NECK Left posterior cervical nodes are identified, appear enlarged and hypermetabolic. Index node measures 1.2 cm and has an SUV max equal to 8.2, image 52 of series 3. Hypermetabolic left supraclavicular lymph node is identified. This measures 1.2 cm and has an SUV max equal to 10.68. CHEST Mild increased FDG uptake within the left hilar region has an SUV max equal to 4.4. Within the posterior mediastinum there is an enlarged lymph node between the esophagus and aorta. This measures 1.6 cm and has an SUV max equal to 15.2, image 100 of series 3. Enlarged and hypermetabolic left cardio phrenic angle lymph node measures 1.3 cm and has an SUV max equal to 9.2, image 124 of series 3. Bilateral pleural effusions are identified left-greater-than-right. No hypermetabolic pulmonary nodule or mass identified. ABDOMEN/PELVIS There is extensive hypermetabolic adenopathy within the upper abdomen. Periaortic nodal mass measures 3.4 cm and has an SUV max equal to 14.6, image 174 of series 3. Hypermetabolic enlarged retrocaval node measures 2.1 cm and has an SUV max equal to 15.0. Extensive hypermetabolic adenopathy within the mesenteric noted. Index jejunal node measures 2 cm and has an SUV max equal to 12.95, image 169 of series 3. Enlarged  and hypermetabolic left external iliac lymph node measures 1.5 cm and has an SUV max equal to 15.78, image 232 of series 3. Extensive hypermetabolic tumor is identified throughout the spleen. The spleen measures 13 cm in length. Hypermetabolic lesion within the spleen measures 10.5 cm  and has an SUV max equal to 13.02. There is evidence of trans peritoneal spread of hypermetabolic tumor within the abdomen. For example, within the left lower quadrant of the abdomen there is soft tissue stranding, fluid and nodularity along the peritoneal reflection. SUV max equals 5.07, image 219 of series 3. Ascites is noted extending over the right lobe of liver. SKELETON No focal hypermetabolic activity to suggest skeletal metastasis. IMPRESSION: 1. Examination is positive for extensive hypermetabolic adenopathy within the neck, chest, abdomen and pelvis compatible with the clinical history of lymphoma. 2. The spleen is enlarged and there is multi focal hypermetabolic splenic lesions. 3. Evidence of trans peritoneal spread of tumor within the upper abdomen. 4. Bilateral pleural effusions. 5. Aortic atherosclerosis and multi vessel coronary artery calcification. Electronically Signed   By: Kerby Moors M.D.   On: 01/16/2016 14:23   US Venous Img Lower Bilateral  Result Date: 01/16/2016 CLINICAL DATA:  Leg swelling for 2 weeks. History of left popliteal DVT in 2014. EXAM: BILATERAL LOWER EXTREMITY VENOUS DOPPLER ULTRASOUND TECHNIQUE: Gray-scale sonography with graded compression, as well as color Doppler and duplex ultrasound were performed to evaluate the lower extremity deep venous systems from the level of the common femoral vein and including the common femoral, femoral, profunda femoral, popliteal and calf veins including the posterior tibial, peroneal and gastrocnemius veins when visible. The superficial great saphenous vein was also interrogated. Spectral Doppler was utilized to evaluate flow at rest and with distal  augmentation maneuvers in the common femoral, femoral and popliteal veins. COMPARISON:  12/15/2012 FINDINGS: RIGHT LOWER EXTREMITY Normal compressibility, augmentation and color Doppler flow in the right common femoral vein, right femoral vein and right popliteal vein. The right saphenofemoral junction is patent. Right profunda femoral vein is patent without thrombus. Visualized right deep calf veins are patent without thrombus. LEFT LOWER EXTREMITY Normal compressibility and color Doppler flow in the left common femoral vein. The left saphenofemoral junction is patent. Left profunda femoral vein is patent without thrombus. Normal compression and color Doppler flow in the left femoral vein. Partial compressibility of the proximal left popliteal vein with nonocclusive thrombus. The distal left popliteal vein is not compressible with occlusive thrombus. There appears to be thrombus within the left peroneal and posterior tibial veins. IMPRESSION: Positive for deep venous thrombosis in the left lower extremity. There is thrombus involving the left calf veins and the left popliteal vein. The clot burden in the left popliteal vein is greater than it was in 2014 suggesting that this is acute thrombus rather than just chronic thrombus. Negative for deep venous thrombosis in right lower extremity. Electronically Signed   By: Markus Daft M.D.   On: 01/16/2016 07:49      Assessment/Plan 1. DVT left lower extremity: Given his worsening thrombocytopenia in association with his need for both biopsy as well as placement of an Infuse-a-Port both of which will require cessation of anticoagulation I do recommend an IVC filter be placed. The risks and benefits of been reviewed with the patient his son is in attendance all questions have been answered and actual filter was shown to them. The patient would like to proceed and arrangements will be made for insertion of IVC filter today. 2. Lymphoma: As noted above patient will  begin chemotherapy he is currently undergoing further workup. Given this fact I will make arrangements for a port to be placed on Monday hopefully working with this same time frame as the bone marrow biopsy. Again I have discussed the  risks and benefits I've let the patient and family know that on Monday and will likely be Dr. Lucky Cowboy and they agree with this as well. 3. Acute renal failure: Patient will continue hydration nephrology is following 4. Hyperkalemia, improved with hydration, bicarbonate 5. Diabetes type 2:  I will place patient on sliding scale insulin.  Decrease his dose of insulin 6. Hyperlipidemia:  I will hold his Lipitor for now 7. Essential hypertension:   lisinopril and HCTZ on hold in light of acute renal failure  Hortencia Pilar, MD  01/18/2016 2:56 PM    This note was created with Dragon medical transcription system.  Any error is purely unintentional

## 2016-01-18 NOTE — Progress Notes (Signed)
ANTICOAGULATION CONSULT NOTE - Initial Consult  Pharmacy Consult for heparin drip Indication: DVT  Allergies  Allergen Reactions  . Codeine Itching  . Penicillins Itching    Patient Measurements: Height: 5\' 11"  (180.3 cm) Weight: 203 lb 8 oz (92.3 kg) IBW/kg (Calculated) : 75.3 Heparin Dosing Weight: 92 kg  Vital Signs: Temp: 98.9 F (37.2 C) (11/16 2018) Temp Source: Oral (11/16 2018) BP: 108/55 (11/16 2018) Pulse Rate: 86 (11/16 2018)  Labs:  Recent Labs  01/16/16 0445 01/17/16 0449 01/17/16 1030 01/17/16 1827 01/18/16 0346  HGB 13.5  --  12.6*  --   --   HCT 41.5  --  38.5*  --   --   PLT 92* 89* 87*  --  88*  APTT  --   --  34 137* >160*  LABPROT  --   --  19.8*  --   --   INR  --   --  1.66  --   --   HEPARINUNFRC  --   --  1.82*  --  1.37*  CREATININE 1.92* 1.68*  --   --  1.60*  1.55*    Estimated Creatinine Clearance: 41.3 mL/min (by C-G formula based on SCr of 1.6 mg/dL (H)).   Medical History: Past Medical History:  Diagnosis Date  . Clotting disorder ([place])   . Diabetes mellitus without complication ([place])   . Heart disease   . Hypertension    Medications:  Scheduled:  . allopurinol  300 mg Oral Daily  . carvedilol  6.25 mg Oral BID WC  . insulin aspart  0-9 Units Subcutaneous TID WC  . insulin glargine  15 Units Subcutaneous QHS  . ketoconazole   Topical Daily  . pneumococcal 23 valent vaccine  0.5 mL Intramuscular Tomorrow-1000  . sodium bicarbonate  650 mg Oral BID   Assessment: Pharmacy consulted to dose and monitor heparin drip in this 82 year old male admitted for tumor lysis syndrome and diagnosed with an acute lower extremity DVT.   Patient was taking rivaroxaban prior to admission for a history of DVT and was converted to apixaban on admission due to ARF and CrCl < 30 mL/min. Patient has received 2 doses of apixaban during admission - last dose at 2206 on 11/15.   Baseline labs (CBC, INR, APTT, and heparin level) have been  ordered STAT.  Heparin drip will begin at 1000 this morning (12 hours after last apixaban dose) and will need to be monitored/adjusted using APTT since patient has been prescribed NOACs. Once APTT and heparin level correlate, can monitor using heparin level.  Goal of Therapy:  Heparin level 0.3-0.7 units/ml aPTT 66 - 102 seconds Monitor platelets by anticoagulation protocol: Yes   Plan:  11/16 APTT 137 - Will decrease heparin infusion to 1300 units/hr.  Check heparin level & APTT in 8 hours and daily while on heparin Continue to monitor H&H and platelets  11/17 AM heparin level 1.37, aPTT 160. Hold drip x 1 hour and restart at 1100 units/hr. Recheck aPTT in 6 hours.   Eloise Harman, PharmD,  Clinical Pharmacist 01/18/2016,6:09 AM

## 2016-01-18 NOTE — Progress Notes (Addendum)
Lab called with critical APTT > 160. Pharmacy adjusting heparin drip. Matt from pharmacy said to hold heparin drip x 1 hour and new order for change in drip rate will be ordered. Will continue to monitor.

## 2016-01-18 NOTE — Progress Notes (Signed)
ANTICOAGULATION CONSULT NOTE - Initial Consult  Pharmacy Consult for heparin drip Indication: DVT  Allergies  Allergen Reactions  . Codeine Itching  . Penicillins Itching    Patient Measurements: Height: 5\' 11"  (180.3 cm) Weight: 203 lb 8 oz (92.3 kg) IBW/kg (Calculated) : 75.3 Heparin Dosing Weight: 92 kg  Vital Signs: Temp: 98.2 F (36.8 C) (11/17 1349) Temp Source: Oral (11/17 1349) BP: 116/54 (11/17 1349) Pulse Rate: 80 (11/17 1349)  Labs:  Recent Labs  01/16/16 0445 01/17/16 0449  01/17/16 1030 01/17/16 1827 01/18/16 0346 01/18/16 0943 01/18/16 1320  HGB 13.5  --   --  12.6*  --   --  13.0 13.3  HCT 41.5  --   --  38.5*  --   --   --  41.7  PLT 92* 89*  --  87*  --  88*  --  102*  APTT  --   --   < > 34 137* >160*  --  96*  LABPROT  --   --   --  19.8*  --   --   --   --   INR  --   --   --  1.66  --   --   --   --   HEPARINUNFRC  --   --   --  1.82*  --  1.37*  --   --   CREATININE 1.92* 1.68*  --   --   --  1.60*  1.55*  --   --   < > = values in this interval not displayed.  Estimated Creatinine Clearance: 41.3 mL/min (by C-G formula based on SCr of 1.6 mg/dL (H)).   Medical History: Past Medical History:  Diagnosis Date  . Clotting disorder ([place])   . Diabetes mellitus without complication ([place])   . Heart disease   . Hypertension    Medications:  Scheduled:  . [MAR Hold] allopurinol  300 mg Oral Daily  . [MAR Hold] carvedilol  6.25 mg Oral BID WC  . [MAR Hold] insulin aspart  0-9 Units Subcutaneous TID WC  . [MAR Hold] insulin glargine  8 Units Subcutaneous QHS  . [MAR Hold] ketoconazole   Topical Daily  . [MAR Hold] pneumococcal 23 valent vaccine  0.5 mL Intramuscular Tomorrow-1000  . [MAR Hold] sodium bicarbonate  650 mg Oral BID   Assessment: Pharmacy consulted to dose and monitor heparin drip in this 82 year old male admitted for tumor lysis syndrome and diagnosed with an acute lower extremity DVT.   Patient was taking rivaroxaban  prior to admission for a history of DVT and was converted to apixaban on admission due to ARF and CrCl < 30 mL/min. Patient has received 2 doses of apixaban during admission - last dose at 2206 on 11/15.   Baseline labs (CBC, INR, APTT, and heparin level) were obtained  Heparin drip will need to be monitored/adjusted using APTT since patient has been prescribed NOACs. Once APTT and heparin level correlate, can monitor using heparin level.  Goal of Therapy:  Heparin level 0.3-0.7 units/ml aPTT 66 - 102 seconds Monitor platelets by anticoagulation protocol: Yes   Plan:  11/17 @ 1320 APTT = 96 seconds (therapeutic) on a rate of 1100 units/hr.  Heparin drip order was discontinued at 1409 today - patient is currently off of the floor. Spoke with RN who reports heparin was stopped when patient went for IVC filter placement.   Pharmacy to follow up with surgeon post procedure.  Lenis Noon, PharmD  Clinical Pharmacist 01/18/2016,2:34 PM

## 2016-01-19 LAB — CBC
HEMATOCRIT: 37.3 % — AB (ref 40.0–52.0)
HEMOGLOBIN: 12.1 g/dL — AB (ref 13.0–18.0)
MCH: 25.5 pg — ABNORMAL LOW (ref 26.0–34.0)
MCHC: 32.5 g/dL (ref 32.0–36.0)
MCV: 78.4 fL — AB (ref 80.0–100.0)
Platelets: 81 10*3/uL — ABNORMAL LOW (ref 150–440)
RBC: 4.76 MIL/uL (ref 4.40–5.90)
RDW: 20.5 % — AB (ref 11.5–14.5)
WBC: 3 10*3/uL — AB (ref 3.8–10.6)

## 2016-01-19 LAB — BASIC METABOLIC PANEL
ANION GAP: 4 — AB (ref 5–15)
BUN: 19 mg/dL (ref 6–20)
CHLORIDE: 110 mmol/L (ref 101–111)
CO2: 20 mmol/L — AB (ref 22–32)
Calcium: 7.6 mg/dL — ABNORMAL LOW (ref 8.9–10.3)
Creatinine, Ser: 1.59 mg/dL — ABNORMAL HIGH (ref 0.61–1.24)
GFR calc non Af Amer: 39 mL/min — ABNORMAL LOW (ref >60)
GFR, EST AFRICAN AMERICAN: 45 mL/min — AB (ref >60)
GLUCOSE: 104 mg/dL — AB (ref 65–99)
POTASSIUM: 4.6 mmol/L (ref 3.5–5.1)
Sodium: 134 mmol/L — ABNORMAL LOW (ref 135–145)

## 2016-01-19 LAB — GLUCOSE, CAPILLARY
GLUCOSE-CAPILLARY: 127 mg/dL — AB (ref 65–99)
GLUCOSE-CAPILLARY: 108 mg/dL — AB (ref 65–99)
Glucose-Capillary: 116 mg/dL — ABNORMAL HIGH (ref 65–99)
Glucose-Capillary: 140 mg/dL — ABNORMAL HIGH (ref 65–99)

## 2016-01-19 LAB — URIC ACID: Uric Acid, Serum: 6.8 mg/dL (ref 4.4–7.6)

## 2016-01-19 LAB — APTT: APTT: 85 s — AB (ref 24–36)

## 2016-01-19 LAB — HEPARIN LEVEL (UNFRACTIONATED): Heparin Unfractionated: 0.58 IU/mL (ref 0.30–0.70)

## 2016-01-19 MED ORDER — INSULIN GLARGINE 100 UNIT/ML ~~LOC~~ SOLN
5.0000 [IU] | Freq: Every day | SUBCUTANEOUS | Status: DC
  Administered 2016-01-19 – 2016-01-24 (×6): 5 [IU] via SUBCUTANEOUS
  Filled 2016-01-19 (×7): qty 0.05

## 2016-01-19 MED ORDER — DRONABINOL 2.5 MG PO CAPS
2.5000 mg | ORAL_CAPSULE | Freq: Two times a day (BID) | ORAL | Status: DC
  Administered 2016-01-19 – 2016-01-28 (×17): 2.5 mg via ORAL
  Filled 2016-01-19 (×18): qty 1

## 2016-01-19 NOTE — Progress Notes (Addendum)
[name]    MR#:  [id number]  DATE OF BIRTH:  Oct 16, 1931  SUBJECTIVE:  CHIEF COMPLAINT:  No chief complaint on file. The patient is a 82 year old Caucasian male with past medical history significant for history of suspected lymphoma, DVT, diabetes, essential hypertension, who presents to the hospital with complaints of elevated LDH, worsening renal failure, elevated uric acid level. He was admitted from oncology office as directed. Admit. He was complaining of feeling weak and tired and not being able to walk. Labs revealed creatinine of 2.34, hyperkalemia, potassium of 5.5, hyponatremia, sodium 133, elevated uric acid level to 12.7, patient was rehydrated, his creatinine improved to 1.92. Today. The patient was seen by oncologist and nephrologist, recommended to continue IV fluids, discussed the role of dialysis and acute renal failure this patient and family. He was initiated on her sodium bicarbonate because of acidosis. PET scan revealed lymphadenopathy in neck, chest, abdomen and pelvis, enlarged spleen, consistent with clinical diagnosis is lymphoma. Patient feels satisfactory today, denies any discomfort. Physical therapist recommended skilled nursing facility placement. Patient was seen by vascular surgeon and recommended IVC filter placement in view of bone marrow and lymph node biopsy and Port-A-Cath placement in the nearest future . Heparin is being continued, to be discontinued on Sunday in anticipation of Port-A-Cath placement and retroperitoneal lymph node biopsy as well as bone marrow biopsy. Patient feels good today, denies any significant pain, status post IVC filter placement 17th of November 2017 by Dr. dew  Review of Systems  Constitutional: Negative for chills, fever and weight loss.  HENT: Negative for congestion.   Eyes: Negative for blurred vision and double vision.  Respiratory: Negative for  cough, sputum production, shortness of breath and wheezing.   Cardiovascular: Negative for chest pain, palpitations, orthopnea, leg swelling and PND.  Gastrointestinal: Negative for abdominal pain, blood in stool, constipation, diarrhea, nausea and vomiting.  Genitourinary: Negative for dysuria, frequency, hematuria and urgency.  Musculoskeletal: Negative for falls.  Neurological: Positive for weakness. Negative for dizziness, tremors, focal weakness and headaches.  Endo/Heme/Allergies: Does not bruise/bleed easily.  Psychiatric/Behavioral: Negative for depression. The patient does not have insomnia.     VITAL SIGNS: Blood pressure 119/86, pulse 85, temperature 98 F (36.7 C), temperature source Oral, resp. rate 18, height 5' 11"  (1.803 m), weight 92.3 kg (203 lb 8 oz), SpO2 93 %.  PHYSICAL EXAMINATION:   GENERAL:  82 y.o.-year-old patient lying in the bed with no acute distress. Comfortable, in good mood  EYES: Pupils equal, round, reactive to light and accommodation. No scleral icterus. Extraocular muscles intact.  HEENT: Head atraumatic, normocephalic. Oropharynx and nasopharynx clear.  NECK:  Supple, no jugular venous distention. No thyroid enlargement, no tenderness.  LUNGS: Normal breath sounds bilaterally, no wheezing, rales,rhonchi or crepitation. No use of accessory muscles of respiration.  CARDIOVASCULAR: S1, S2 normal. No murmurs, rubs, or gallops.  ABDOMEN: Soft, nontender, nondistended. Bowel sounds present. No organomegaly or mass.  EXTREMITIES: No pedal edema, cyanosis, or clubbing. Left lower extremity edema below the knee, right groin has dressing placed, no swelling, no bleeding, no pain on palpation. Peripheral pulses are palpable NEUROLOGIC: Cranial nerves II through XII are intact. Muscle strength 5/5 in all extremities. Sensation intact. Gait not checked.  PSYCHIATRIC: The patient is alert and oriented x 3.  SKIN: No obvious rash, lesion, or ulcer.   ORDERS/RESULTS  REVIEWED:   CBC  Recent Labs Lab 01/14/16 1619 01/16/16  0240 01/17/16 0449 01/17/16 1030 01/18/16 0346 01/18/16 0943 01/18/16 1320 01/19/16 0318  WBC 6.2 4.3  --  3.8  --   --  3.8 3.0*  HGB 15.6 13.5  --  12.6*  --  13.0 13.3 12.1*  HCT 48.1 41.5  --  38.5*  --   --  41.7 37.3*  PLT 99* 92* 89* 87* 88*  --  102* 81*  MCV 77.8* 79.5*  --  78.4*  --   --  79.0* 78.4*  MCH 25.2* 25.9*  --  25.7*  --   --  25.3* 25.5*  MCHC 32.4 32.5  --  32.7  --   --  32.0 32.5  RDW 19.0* 19.6*  --  20.1*  --   --  19.6* 20.5*  LYMPHSABS 0.7*  --   --   --   --   --   --   --   MONOABS 0.8  --   --   --   --   --   --   --   EOSABS 0.2  --   --   --   --   --   --   --   BASOSABS 0.1  --   --   --   --   --   --   --    ------------------------------------------------------------------------------------------------------------------  Chemistries   Recent Labs Lab 01/14/16 1619 01/16/16 0445 01/17/16 0449 01/18/16 0346 01/19/16 0318  NA 133* 136 136 135 134*  K 5.5* 5.1 4.5 4.5 4.6  CL 102 109 110 108 110  CO2 22 20* 20* 19* 20*  GLUCOSE 198* 145* 110* 69 104*  BUN 46* 34* 27* 22* 19  CREATININE 2.34* 1.92* 1.68* 1.60*  1.55* 1.59*  CALCIUM 8.9 8.0* 7.8* 7.7* 7.6*  MG  --   --  1.5* 1.8  --   AST 37  --   --   --   --   ALT 29  --   --   --   --   ALKPHOS 47  --   --   --   --   BILITOT 0.9  --   --   --   --    ------------------------------------------------------------------------------------------------------------------ estimated creatinine clearance is 41.6 mL/min (by C-G formula based on SCr of 1.59 mg/dL (H)). ------------------------------------------------------------------------------------------------------------------ No results for input(s): TSH, T4TOTAL, T3FREE, THYROIDAB in the last 72 hours.  Invalid input(s): FREET3  Cardiac Enzymes No results for input(s): CKMB, TROPONINI, MYOGLOBIN in the last 168 hours.  Invalid input(s):  CK ------------------------------------------------------------------------------------------------------------------ Invalid input(s): POCBNP ---------------------------------------------------------------------------------------------------------------  RADIOLOGY: No results found.  EKG:  Orders placed or performed in visit on 08/27/06  . EKG 12-Lead    ASSESSMENT AND PLAN:  Active Problems:   Tumor lysis syndrome #1. Acute renal failure due to tumor lysis syndrome, Creatinine has improved from 2.34, GFR of 24 to 1.59, GFR 39. Today, continue IV fluids, alkalinization with bicarbonate, allopurinol, appreciate oncologist and nephrologist inputs, kidney function is improving, follow creatinine in the morning. G6PD test was negative #2 metabolic acidosis, continue  bicarbonate orally, bicarbonate level is better at 20, follow in the morning #3. Hyperkalemia, improved with hydration, bicarbonate #4. Hyponatremia, resolved with IV hydration #5, hyperuricemia, improving with IV hydration, alkalinization and allopurinol, following uric acid levels daily, per oncology's recommendations #6. Suspected lymphoma, PET scan reveals lymphadenopathy in neck, chest, abdomen and pelvis, splenomegaly, consistent with clinical diagnosis of lymphoma, per radiologist, oncologist. The patient is to undergo retroperitoneal  lymph node and bone marrow biopsy on Monday 20th of November 2017 and Port-A-Cath placement the same day. Heparin is to be stopped a day before for procedure. Echocardiogram was normal with ejection fraction of 55-60%, hepatitis serologies were negative, planed treatment was discussed this patient by oncologist. #7, acute left lower extremity DVT, status post IVC filter placement 17th of November 2017 by Dr. dew , now back on heparin intravenously, off Eliquis , oncologist recommends to discontinue heparin if patient can't drops down below 50,000.   #8. Hypoglycemia and diabetic, decrease insulin  Lantus dose, continue sliding scale, follow oral intake, add Marinol  Management plans discussed with the patient, family and they are in agreement.   DRUG ALLERGIES:  Allergies  Allergen Reactions  . Codeine Itching  . Penicillins Itching    CODE STATUS:     Code Status Orders        Start     Ordered   01/15/16 1512  Full code  Continuous     01/15/16 1511    Code Status History    Date Active Date Inactive Code Status Order ID Comments User Context   This patient has a current code status but no historical code status.    Advance Directive Documentation   [facility] Most Recent Value  Type of Advance Directive  Living will  Pre-existing out of facility DNR order (yellow form or pink MOST form)  No data  "MOST" Form in Place?  No data      TOTAL TIME TAKING CARE OF THIS PATIENT: 30 minutes.  Discussed with patient's family  Jaleya Pebley M.D on 01/19/2016 at 2:00 PM  Between 7am to 6pm - Pager - [phone]  After 6pm go to [email or web address] - password [email or web address] [facility]  Office  [phone]  CC: Primary care physician; Kirk Ruths., MD

## 2016-01-19 NOTE — Progress Notes (Signed)
[facility] Hematology/Oncology Progress Note  Date of admission: 01/15/2016  Hospital day:  01/18/2016  Chief Complaint: Hayden Martin is a 82 y.o. 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  Subjective:  Alert, offers no new complaints today.  Social History: Patient's son in the room.  Allergies:  Allergies  Allergen Reactions  . Codeine Itching  . Penicillins Itching    Scheduled Medications: . allopurinol  300 mg Oral Daily  . carvedilol  6.25 mg Oral BID WC  . insulin aspart  0-9 Units Subcutaneous TID WC  . insulin glargine  5 Units Subcutaneous QHS  . ketoconazole   Topical Daily  . pneumococcal 23 valent vaccine  0.5 mL Intramuscular Tomorrow-1000  . sodium bicarbonate  650 mg Oral BID    Review of Systems: GENERAL: No energy. No fevers or sweats. Weight loss of 30-35 pounds in 2-3 months. PERFORMANCE STATUS (ECOG): 2 HEENT: No visual changes, runny nose, sore throat, mouth sores or tenderness. Lungs: No shortness of breath. Little non-productive cough. No hemoptysis. Cardiac: No chest pain, palpitations, orthopnea, or PND. GI: No appetite. No nausea, vomiting, diarrhea, constipation, or hematochezia. GU: No urgency, frequency, dysuria, or hematuria. Musculoskeletal: Back issues. Joints hurt. No muscle tenderness. Extremities: No pain or swelling. Skin: Pruritus. No rashes or skin changes. Neuro: General weakness.  No headache, numbness or weakness, or coordination issues. Balance issues. Endocrine: Diabetes. No thyroid issues, hot flashes or night sweats. Psych: No mood changes, depression or anxiety. Pain: No focal pain. Review of systems:  All other systems reviewed and found to be negative.  Physical Exam: Blood pressure 119/86, pulse 85, temperature 98 F (36.7 C), temperature source Oral, resp. rate 18, height 5' 11" (1.803 m), weight 203 lb 8 oz (92.3 kg), SpO2 93 %.   GENERAL:Elderly gentleman resting comfortably on the medical unit in no acute distress. MENTAL STATUS: Alert and oriented to person, place and time. HEAD:Gray hair. Male pattern baldness. Normocephalic, atraumatic, face symmetric, no Cushingoid features. EYES:Blueeyes. Pupils equal round and reactive to light and accomodation. No conjunctivitis or scleral icterus. JXB:JYNWGNF aide. Oropharynx clear without lesion. Tonguenormal. Mucous membranes moist. RESPIRATORY: Clear to auscultationwithout rales, wheezes or rhonchi. CARDIOVASCULAR:Regular rate andrhythmwithout murmur, rub or gallop. ABDOMEN:Soft, non-tender, with active bowel sounds, and no hepatomegaly. Left upper quadrant fullness. No masses. GROIN:  Small dressing with overlying Tegaderm in right groin s/p access for IVC filter placement. SKIN: No rashes, ulcers or lesions. EXTREMITIES: Left lower extremity edema below the knee. Noskin discoloration or tenderness. No palpable cords. LYMPHNODES: No palpable cervical, supraclavicular, axillary or inguinal adenopathy  NEUROLOGICAL: Unremarkable. PSYCH: Appropriate.   Results for orders placed or performed during the hospital encounter of 01/15/16 (from the past 48 hour(s))  Glucose, capillary     Status: Abnormal   Collection Time: 01/17/16  5:04 PM  Result Value Ref Range   Glucose-Capillary 104 (H) 65 - 99 mg/dL  APTT     Status: Abnormal   Collection Time: 01/17/16  6:27 PM  Result Value Ref Range   aPTT 137 (H) 24 - 36 seconds    Comment:        IF BASELINE aPTT IS ELEVATED, SUGGEST PATIENT RISK ASSESSMENT BE USED TO DETERMINE APPROPRIATE ANTICOAGULANT THERAPY.   Glucose, capillary     Status: Abnormal   Collection Time: 01/17/16  9:34 PM  Result Value Ref Range   Glucose-Capillary 110 (H) 65 - 99 mg/dL  Creatinine, serum     Status:  Abnormal   Collection Time: 01/18/16  3:46 AM  Result Value Ref Range   Creatinine, Ser 1.60 (H) 0.61 -  1.24 mg/dL   GFR calc non Af Amer 38 (L) >60 mL/min   GFR calc Af Amer 45 (L) >60 mL/min    Comment: (NOTE) The eGFR has been calculated using the CKD EPI equation. This calculation has not been validated in all clinical situations. eGFR's persistently <60 mL/min signify possible Chronic Kidney Disease.   Uric acid     Status: Abnormal   Collection Time: 01/18/16  3:46 AM  Result Value Ref Range   Uric Acid, Serum 7.8 (H) 4.4 - 7.6 mg/dL  Magnesium     Status: None   Collection Time: 01/18/16  3:46 AM  Result Value Ref Range   Magnesium 1.8 1.7 - 2.4 mg/dL  Platelet count     Status: Abnormal   Collection Time: 01/18/16  3:46 AM  Result Value Ref Range   Platelets 88 (L) 150 - 440 K/uL  APTT     Status: Abnormal   Collection Time: 01/18/16  3:46 AM  Result Value Ref Range   aPTT >160 (HH) 24 - 36 seconds    Comment:        IF BASELINE aPTT IS ELEVATED, SUGGEST PATIENT RISK ASSESSMENT BE USED TO DETERMINE APPROPRIATE ANTICOAGULANT THERAPY. RESULT REPEATED AND VERIFIED CRITICAL RESULT CALLED TO, READ BACK BY AND VERIFIED WITH: DRUCILLA JACKSON AT [id number] ON 01/18/16.Marland KitchenMarland KitchenWesley Chapel   Heparin level (unfractionated)     Status: Abnormal   Collection Time: 01/18/16  3:46 AM  Result Value Ref Range   Heparin Unfractionated 1.37 (H) 0.30 - 0.70 IU/mL    Comment:        IF HEPARIN RESULTS ARE BELOW EXPECTED VALUES, AND PATIENT DOSAGE HAS BEEN CONFIRMED, SUGGEST FOLLOW UP TESTING OF ANTITHROMBIN III LEVELS.   Basic metabolic panel     Status: Abnormal   Collection Time: 01/18/16  3:46 AM  Result Value Ref Range   Sodium 135 135 - 145 mmol/L   Potassium 4.5 3.5 - 5.1 mmol/L   Chloride 108 101 - 111 mmol/L   CO2 19 (L) 22 - 32 mmol/L   Glucose, Bld 69 65 - 99 mg/dL   BUN 22 (H) 6 - 20 mg/dL   Creatinine, Ser 1.55 (H) 0.61 - 1.24 mg/dL   Calcium 7.7 (L) 8.9 - 10.3 mg/dL   GFR calc non Af Amer 40 (L) >60 mL/min   GFR calc Af Amer 46 (L) >60 mL/min    Comment: (NOTE) The eGFR has been  calculated using the CKD EPI equation. This calculation has not been validated in all clinical situations. eGFR's persistently <60 mL/min signify possible Chronic Kidney Disease.    Anion gap 8 5 - 15  Phosphorus     Status: Abnormal   Collection Time: 01/18/16  3:46 AM  Result Value Ref Range   Phosphorus 2.3 (L) 2.5 - 4.6 mg/dL  Glucose, capillary     Status: Abnormal   Collection Time: 01/18/16  7:34 AM  Result Value Ref Range   Glucose-Capillary 58 (L) 65 - 99 mg/dL  Glucose, capillary     Status: Abnormal   Collection Time: 01/18/16  7:36 AM  Result Value Ref Range   Glucose-Capillary 58 (L) 65 - 99 mg/dL  Glucose, capillary     Status: None   Collection Time: 01/18/16  8:23 AM  Result Value Ref Range   Glucose-Capillary 84 65 - 99 mg/dL  Hemoglobin  Status: None   Collection Time: 01/18/16  9:43 AM  Result Value Ref Range   Hemoglobin 13.0 13.0 - 18.0 g/dL  Glucose, capillary     Status: Abnormal   Collection Time: 01/18/16 11:28 AM  Result Value Ref Range   Glucose-Capillary 116 (H) 65 - 99 mg/dL  APTT     Status: Abnormal   Collection Time: 01/18/16  1:20 PM  Result Value Ref Range   aPTT 96 (H) 24 - 36 seconds    Comment:        IF BASELINE aPTT IS ELEVATED, SUGGEST PATIENT RISK ASSESSMENT BE USED TO DETERMINE APPROPRIATE ANTICOAGULANT THERAPY.   CBC     Status: Abnormal   Collection Time: 01/18/16  1:20 PM  Result Value Ref Range   WBC 3.8 3.8 - 10.6 K/uL   RBC 5.28 4.40 - 5.90 MIL/uL   Hemoglobin 13.3 13.0 - 18.0 g/dL   HCT 41.7 40.0 - 52.0 %   MCV 79.0 (L) 80.0 - 100.0 fL   MCH 25.3 (L) 26.0 - 34.0 pg   MCHC 32.0 32.0 - 36.0 g/dL   RDW 19.6 (H) 11.5 - 14.5 %   Platelets 102 (L) 150 - 440 K/uL  Glucose, capillary     Status: None   Collection Time: 01/18/16  4:40 PM  Result Value Ref Range   Glucose-Capillary 85 65 - 99 mg/dL  Glucose, capillary     Status: Abnormal   Collection Time: 01/18/16  9:34 PM  Result Value Ref Range    Glucose-Capillary 122 (H) 65 - 99 mg/dL  CBC     Status: Abnormal   Collection Time: 01/19/16  3:18 AM  Result Value Ref Range   WBC 3.0 (L) 3.8 - 10.6 K/uL   RBC 4.76 4.40 - 5.90 MIL/uL   Hemoglobin 12.1 (L) 13.0 - 18.0 g/dL   HCT 37.3 (L) 40.0 - 52.0 %   MCV 78.4 (L) 80.0 - 100.0 fL   MCH 25.5 (L) 26.0 - 34.0 pg   MCHC 32.5 32.0 - 36.0 g/dL   RDW 20.5 (H) 11.5 - 14.5 %   Platelets 81 (L) 150 - 440 K/uL  Basic metabolic panel     Status: Abnormal   Collection Time: 01/19/16  3:18 AM  Result Value Ref Range   Sodium 134 (L) 135 - 145 mmol/L   Potassium 4.6 3.5 - 5.1 mmol/L   Chloride 110 101 - 111 mmol/L   CO2 20 (L) 22 - 32 mmol/L   Glucose, Bld 104 (H) 65 - 99 mg/dL   BUN 19 6 - 20 mg/dL   Creatinine, Ser 1.59 (H) 0.61 - 1.24 mg/dL   Calcium 7.6 (L) 8.9 - 10.3 mg/dL   GFR calc non Af Amer 39 (L) >60 mL/min   GFR calc Af Amer 45 (L) >60 mL/min    Comment: (NOTE) The eGFR has been calculated using the CKD EPI equation. This calculation has not been validated in all clinical situations. eGFR's persistently <60 mL/min signify possible Chronic Kidney Disease.    Anion gap 4 (L) 5 - 15  Heparin level (unfractionated)     Status: None   Collection Time: 01/19/16  3:18 AM  Result Value Ref Range   Heparin Unfractionated 0.58 0.30 - 0.70 IU/mL    Comment:        IF HEPARIN RESULTS ARE BELOW EXPECTED VALUES, AND PATIENT DOSAGE HAS BEEN CONFIRMED, SUGGEST FOLLOW UP TESTING OF ANTITHROMBIN III LEVELS.   APTT  Status: Abnormal   Collection Time: 01/19/16  3:18 AM  Result Value Ref Range   aPTT 85 (H) 24 - 36 seconds    Comment:        IF BASELINE aPTT IS ELEVATED, SUGGEST PATIENT RISK ASSESSMENT BE USED TO DETERMINE APPROPRIATE ANTICOAGULANT THERAPY.   Uric acid     Status: None   Collection Time: 01/19/16  3:18 AM  Result Value Ref Range   Uric Acid, Serum 6.8 4.4 - 7.6 mg/dL  Glucose, capillary     Status: Abnormal   Collection Time: 01/19/16  8:18 AM  Result  Value Ref Range   Glucose-Capillary 116 (H) 65 - 99 mg/dL  Glucose, capillary     Status: Abnormal   Collection Time: 01/19/16 11:47 AM  Result Value Ref Range   Glucose-Capillary 127 (H) 65 - 99 mg/dL   Comment 1 Notify RN    Comment 2 Document in Chart    No results found.  Assessment:  Hayden Martin is a 82 y.o. 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    PET scan on 01/16/2016 revealed extensive hypermetabolic adenopathy within the neck, chest, abdomen and pelvis.  The spleen was enlarged with  multi focal hypermetabolic splenic lesions  There was evidence of trans peritoneal spread of tumor within the upper abdomen.  There were bilateral pleural effusions.  He has a history of left lower extremity thrombosis (2014). Bilateral lower extremity duplex on 01/16/2016 revealed DVT in the left lower extremity involving the calf veins and the left popliteal vein.  Clot burden greater than in 2014 suggesting acute thrombus.  He received Eliquis on 01/16/2016.  He is on heparin.  IVC filter was placed on 01/18/2016.  He has acute on chronic renal insufficiency secondary to tumor lysis.  Baseline creatinine is 1.3- 1.4.  Creatinine was 2.34 on admission with a uric acid of  12.7.  Creatinine has improved to 1.55.  Uric acid has improved to 7.8.  He has an enlarged prostate and an elevated PSA.  Symptomatically, he feels a little better today.  Plan:   1.  Oncology:  PET scan results reviewed independently. Previously images were discussed with family. Patient likely has stage IV lymphoma.  Patient discussed at tumor board on 01/17/2016.  Plan for biopsy of retroperitoneal lymph node and bone marrow biopsy on 01/21/2016 (biopsy delayed secondary to Eliquis taken on 01/16/2016).  Orders placed.  Pathology to expedite evaluation in order to treat as inpatient on 11/21 or 11/22 (prior to Thanksgiving holiday).  Side  effects of planned treatment reviewed with patient.  Hepatitis serologies negative (in anticipation of Rituxan).  Echo on 01/18/2016 revealed an EF of 55-60%.  Vascular surgery consulted for port placement.  Per family, port to be placed on Monday, 01/21/2016.  2.  Hematology:  Patient has an acute thrombus in left lower extremity.  Eliquis switched to heparin to allow infusion to be held prior to upcoming biopsies on 01/21/2016.  IVC filter placed on 01/18/2016. Continue heparin over weekend with discontinuation on Sunday, 01/20/2016.   Thrombocytopenia on admission (platelet count 89,000) felt secondary to splenomegaly and possibly marrow disease.  Platelets essentially stable today.  Bone marrow biopsy on 01/21/2016.  Follow platelet count daily.  Discontinue heparin if platelets < 50,000.  3.  Nephrology:  Tumor lysis syndrome on IVF, alkalinization, and allopurinol.   Creatinine was 2.34 on admission, now improved to 1.59. Uric  acid also continues to improve at 6.8 today. Check BMP, uric acid, phosphorus daily.  G6PD testing negative (ok to give rasburicase if needed).  Appreciate nephrology consult.  4.  Disposition:  Patient will remain hospitalized until treatment (and a few days after) to manage tumor lysis.  Once stability documented, patient can be discharged.  5.  Code status:  Discussed with patient and family.  Full code.   Lloyd Huger, MD  01/18/2016

## 2016-01-19 NOTE — Progress Notes (Signed)
[facility] Hematology/Oncology Progress Note  Date of admission: 01/15/2016  Hospital day:  01/18/2016  Chief Complaint: Hayden Martin is a 82 y.o. 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  Subjective:  Feels better.  No new complaints.  Social History: The patient is accompanied by numerous family members today.  Allergies:  Allergies  Allergen Reactions  . Codeine Itching  . Penicillins Itching    Scheduled Medications: . allopurinol  300 mg Oral Daily  . carvedilol  6.25 mg Oral BID WC  . insulin aspart  0-9 Units Subcutaneous TID WC  . insulin glargine  8 Units Subcutaneous QHS  . ketoconazole   Topical Daily  . pneumococcal 23 valent vaccine  0.5 mL Intramuscular Tomorrow-1000  . sodium bicarbonate  650 mg Oral BID    Review of Systems: GENERAL: No energy. No fevers or sweats. Weight loss of 30-35 pounds in 2-3 months. PERFORMANCE STATUS (ECOG): 2 HEENT: No visual changes, runny nose, sore throat, mouth sores or tenderness. Lungs: No shortness of breath. Little non-productive cough. No hemoptysis. Cardiac: No chest pain, palpitations, orthopnea, or PND. GI: No appetite. No nausea, vomiting, diarrhea, constipation, or hematochezia. GU: No urgency, frequency, dysuria, or hematuria. Musculoskeletal: Back issues. Joints hurt. No muscle tenderness. Extremities: No pain or swelling. Skin: Pruritus. No rashes or skin changes. Neuro: General weakness.  No headache, numbness or weakness, or coordination issues. Balance issues. Endocrine: Diabetes. No thyroid issues, hot flashes or night sweats. Psych: No mood changes, depression or anxiety. Pain: No focal pain. Review of systems:  All other systems reviewed and found to be negative.  Physical Exam: Blood pressure (!) 105/40, pulse 76, temperature 98 F (36.7 C), temperature source Oral, resp. rate (!) 26, height '5\' 11"'$  (1.803 m), weight 203 lb 8  oz (92.3 kg), SpO2 92 %.  GENERAL:Elderly gentleman resting comfortably on the medical unit in no acute distress. MENTAL STATUS: Alert and oriented to person, place and time. HEAD:Gray hair. Male pattern baldness. Normocephalic, atraumatic, face symmetric, no Cushingoid features. EYES:Blueeyes. Pupils equal round and reactive to light and accomodation. No conjunctivitis or scleral icterus. FUX:NATFTDD aide. Oropharynx clear without lesion. Tonguenormal. Mucous membranes moist. RESPIRATORY: Clear to auscultationwithout rales, wheezes or rhonchi. CARDIOVASCULAR:Regular rate andrhythmwithout murmur, rub or gallop. ABDOMEN:Soft, non-tender, with active bowel sounds, and no hepatomegaly. Left upper quadrant fullness. No masses. GROIN:  Small dressing with overlying Tegaderm in right groin s/p access for IVC filter placement. SKIN: No rashes, ulcers or lesions. EXTREMITIES: Left lower extremity edema below the knee. Noskin discoloration or tenderness. No palpable cords. LYMPHNODES: No palpable cervical, supraclavicular, axillary or inguinal adenopathy  NEUROLOGICAL: Unremarkable. PSYCH: Appropriate.   Results for orders placed or performed during the hospital encounter of 01/15/16 (from the past 48 hour(s))  Glucose, capillary     Status: None   Collection Time: 01/17/16  7:28 AM  Result Value Ref Range   Glucose-Capillary 94 65 - 99 mg/dL  CBC     Status: Abnormal   Collection Time: 01/17/16 10:30 AM  Result Value Ref Range   WBC 3.8 3.8 - 10.6 K/uL   RBC 4.91 4.40 - 5.90 MIL/uL   Hemoglobin 12.6 (L) 13.0 - 18.0 g/dL   HCT 38.5 (L) 40.0 - 52.0 %   MCV 78.4 (L) 80.0 - 100.0 fL   MCH 25.7 (L) 26.0 - 34.0 pg   MCHC 32.7 32.0 - 36.0 g/dL   RDW 20.1 (H) 11.5 - 14.5 %   Platelets  87 (L) 150 - 440 K/uL  Protime-INR     Status: Abnormal   Collection Time: 01/17/16 10:30 AM  Result Value Ref Range   Prothrombin Time 19.8 (H) 11.4 - 15.2 seconds   INR 1.66    APTT     Status: None   Collection Time: 01/17/16 10:30 AM  Result Value Ref Range   aPTT 34 24 - 36 seconds  Heparin level (unfractionated)     Status: Abnormal   Collection Time: 01/17/16 10:30 AM  Result Value Ref Range   Heparin Unfractionated 1.82 (H) 0.30 - 0.70 IU/mL    Comment:        IF HEPARIN RESULTS ARE BELOW EXPECTED VALUES, AND PATIENT DOSAGE HAS BEEN CONFIRMED, SUGGEST FOLLOW UP TESTING OF ANTITHROMBIN III LEVELS.   Glucose, capillary     Status: Abnormal   Collection Time: 01/17/16 11:37 AM  Result Value Ref Range   Glucose-Capillary 113 (H) 65 - 99 mg/dL  Glucose, capillary     Status: Abnormal   Collection Time: 01/17/16  5:04 PM  Result Value Ref Range   Glucose-Capillary 104 (H) 65 - 99 mg/dL  APTT     Status: Abnormal   Collection Time: 01/17/16  6:27 PM  Result Value Ref Range   aPTT 137 (H) 24 - 36 seconds    Comment:        IF BASELINE aPTT IS ELEVATED, SUGGEST PATIENT RISK ASSESSMENT BE USED TO DETERMINE APPROPRIATE ANTICOAGULANT THERAPY.   Glucose, capillary     Status: Abnormal   Collection Time: 01/17/16  9:34 PM  Result Value Ref Range   Glucose-Capillary 110 (H) 65 - 99 mg/dL  Creatinine, serum     Status: Abnormal   Collection Time: 01/18/16  3:46 AM  Result Value Ref Range   Creatinine, Ser 1.60 (H) 0.61 - 1.24 mg/dL   GFR calc non Af Amer 38 (L) >60 mL/min   GFR calc Af Amer 45 (L) >60 mL/min    Comment: (NOTE) The eGFR has been calculated using the CKD EPI equation. This calculation has not been validated in all clinical situations. eGFR's persistently <60 mL/min signify possible Chronic Kidney Disease.   Uric acid     Status: Abnormal   Collection Time: 01/18/16  3:46 AM  Result Value Ref Range   Uric Acid, Serum 7.8 (H) 4.4 - 7.6 mg/dL  Magnesium     Status: None   Collection Time: 01/18/16  3:46 AM  Result Value Ref Range   Magnesium 1.8 1.7 - 2.4 mg/dL  Platelet count     Status: Abnormal   Collection Time: 01/18/16   3:46 AM  Result Value Ref Range   Platelets 88 (L) 150 - 440 K/uL  APTT     Status: Abnormal   Collection Time: 01/18/16  3:46 AM  Result Value Ref Range   aPTT >160 (HH) 24 - 36 seconds    Comment:        IF BASELINE aPTT IS ELEVATED, SUGGEST PATIENT RISK ASSESSMENT BE USED TO DETERMINE APPROPRIATE ANTICOAGULANT THERAPY. RESULT REPEATED AND VERIFIED CRITICAL RESULT CALLED TO, READ BACK BY AND VERIFIED WITH: DRUCILLA JACKSON AT 4540 ON 01/18/16.Marland KitchenMarland KitchenAffton   Heparin level (unfractionated)     Status: Abnormal   Collection Time: 01/18/16  3:46 AM  Result Value Ref Range   Heparin Unfractionated 1.37 (H) 0.30 - 0.70 IU/mL    Comment:        IF HEPARIN RESULTS ARE BELOW EXPECTED VALUES, AND PATIENT DOSAGE HAS  BEEN CONFIRMED, SUGGEST FOLLOW UP TESTING OF ANTITHROMBIN III LEVELS.   Basic metabolic panel     Status: Abnormal   Collection Time: 01/18/16  3:46 AM  Result Value Ref Range   Sodium 135 135 - 145 mmol/L   Potassium 4.5 3.5 - 5.1 mmol/L   Chloride 108 101 - 111 mmol/L   CO2 19 (L) 22 - 32 mmol/L   Glucose, Bld 69 65 - 99 mg/dL   BUN 22 (H) 6 - 20 mg/dL   Creatinine, Ser 1.55 (H) 0.61 - 1.24 mg/dL   Calcium 7.7 (L) 8.9 - 10.3 mg/dL   GFR calc non Af Amer 40 (L) >60 mL/min   GFR calc Af Amer 46 (L) >60 mL/min    Comment: (NOTE) The eGFR has been calculated using the CKD EPI equation. This calculation has not been validated in all clinical situations. eGFR's persistently <60 mL/min signify possible Chronic Kidney Disease.    Anion gap 8 5 - 15  Phosphorus     Status: Abnormal   Collection Time: 01/18/16  3:46 AM  Result Value Ref Range   Phosphorus 2.3 (L) 2.5 - 4.6 mg/dL  Glucose, capillary     Status: Abnormal   Collection Time: 01/18/16  7:34 AM  Result Value Ref Range   Glucose-Capillary 58 (L) 65 - 99 mg/dL  Glucose, capillary     Status: Abnormal   Collection Time: 01/18/16  7:36 AM  Result Value Ref Range   Glucose-Capillary 58 (L) 65 - 99 mg/dL  Glucose,  capillary     Status: None   Collection Time: 01/18/16  8:23 AM  Result Value Ref Range   Glucose-Capillary 84 65 - 99 mg/dL  Hemoglobin     Status: None   Collection Time: 01/18/16  9:43 AM  Result Value Ref Range   Hemoglobin 13.0 13.0 - 18.0 g/dL  Glucose, capillary     Status: Abnormal   Collection Time: 01/18/16 11:28 AM  Result Value Ref Range   Glucose-Capillary 116 (H) 65 - 99 mg/dL  APTT     Status: Abnormal   Collection Time: 01/18/16  1:20 PM  Result Value Ref Range   aPTT 96 (H) 24 - 36 seconds    Comment:        IF BASELINE aPTT IS ELEVATED, SUGGEST PATIENT RISK ASSESSMENT BE USED TO DETERMINE APPROPRIATE ANTICOAGULANT THERAPY.   CBC     Status: Abnormal   Collection Time: 01/18/16  1:20 PM  Result Value Ref Range   WBC 3.8 3.8 - 10.6 K/uL   RBC 5.28 4.40 - 5.90 MIL/uL   Hemoglobin 13.3 13.0 - 18.0 g/dL   HCT 41.7 40.0 - 52.0 %   MCV 79.0 (L) 80.0 - 100.0 fL   MCH 25.3 (L) 26.0 - 34.0 pg   MCHC 32.0 32.0 - 36.0 g/dL   RDW 19.6 (H) 11.5 - 14.5 %   Platelets 102 (L) 150 - 440 K/uL  Glucose, capillary     Status: None   Collection Time: 01/18/16  4:40 PM  Result Value Ref Range   Glucose-Capillary 85 65 - 99 mg/dL  Glucose, capillary     Status: Abnormal   Collection Time: 01/18/16  9:34 PM  Result Value Ref Range   Glucose-Capillary 122 (H) 65 - 99 mg/dL  CBC     Status: Abnormal   Collection Time: 01/19/16  3:18 AM  Result Value Ref Range   WBC 3.0 (L) 3.8 - 10.6 K/uL   RBC 4.76  4.40 - 5.90 MIL/uL   Hemoglobin 12.1 (L) 13.0 - 18.0 g/dL   HCT 37.3 (L) 40.0 - 52.0 %   MCV 78.4 (L) 80.0 - 100.0 fL   MCH 25.5 (L) 26.0 - 34.0 pg   MCHC 32.5 32.0 - 36.0 g/dL   RDW 20.5 (H) 11.5 - 14.5 %   Platelets 81 (L) 150 - 440 K/uL  Basic metabolic panel     Status: Abnormal   Collection Time: 01/19/16  3:18 AM  Result Value Ref Range   Sodium 134 (L) 135 - 145 mmol/L   Potassium 4.6 3.5 - 5.1 mmol/L   Chloride 110 101 - 111 mmol/L   CO2 20 (L) 22 - 32 mmol/L    Glucose, Bld 104 (H) 65 - 99 mg/dL   BUN 19 6 - 20 mg/dL   Creatinine, Ser 1.59 (H) 0.61 - 1.24 mg/dL   Calcium 7.6 (L) 8.9 - 10.3 mg/dL   GFR calc non Af Amer 39 (L) >60 mL/min   GFR calc Af Amer 45 (L) >60 mL/min    Comment: (NOTE) The eGFR has been calculated using the CKD EPI equation. This calculation has not been validated in all clinical situations. eGFR's persistently <60 mL/min signify possible Chronic Kidney Disease.    Anion gap 4 (L) 5 - 15  Heparin level (unfractionated)     Status: None   Collection Time: 01/19/16  3:18 AM  Result Value Ref Range   Heparin Unfractionated 0.58 0.30 - 0.70 IU/mL    Comment:        IF HEPARIN RESULTS ARE BELOW EXPECTED VALUES, AND PATIENT DOSAGE HAS BEEN CONFIRMED, SUGGEST FOLLOW UP TESTING OF ANTITHROMBIN III LEVELS.   APTT     Status: Abnormal   Collection Time: 01/19/16  3:18 AM  Result Value Ref Range   aPTT 85 (H) 24 - 36 seconds    Comment:        IF BASELINE aPTT IS ELEVATED, SUGGEST PATIENT RISK ASSESSMENT BE USED TO DETERMINE APPROPRIATE ANTICOAGULANT THERAPY.    No results found.  Assessment:  Hayden Martin is a 82 y.o. 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    PET scan on 01/16/2016 revealed extensive hypermetabolic adenopathy within the neck, chest, abdomen and pelvis.  The spleen was enlarged with  multi focal hypermetabolic splenic lesions  There was evidence of trans peritoneal spread of tumor within the upper abdomen.  There were bilateral pleural effusions.  He has a history of left lower extremity thrombosis (2014). Bilateral lower extremity duplex on 01/16/2016 revealed DVT in the left lower extremity involving the calf veins and the left popliteal vein.  Clot burden greater than in 2014 suggesting acute thrombus.  He received Eliquis on 01/16/2016.  He is on heparin.  IVC filter was placed on 01/18/2016.  He has acute on  chronic renal insufficiency secondary to tumor lysis.  Baseline creatinine is 1.3- 1.4.  Creatinine was 2.34 on admission with a uric acid of  12.7.  Creatinine has improved to 1.55.  Uric acid has improved to 7.8.  He has an enlarged prostate and an elevated PSA.  Symptomatically, he feels a little better today.  Plan:   1.  Oncology:  Discussed PET scan with images shown to family on 01/17/2016.  Patient likely has stage IV lymphoma.  Patient discussed at tumor board on 01/17/2016.  Plan for biopsy of retroperitoneal lymph node and  bone marrow biopsy on 01/21/2016 (biopsy delayed secondary to Eliquis taken on 01/16/2016).  Orders placed.  Pathology to expedite evaluation in order to treat as inpatient on 11/21 or 11/22 (prior to Thanksgiving holiday).  Side effects of planned treatment reviewed with patient.  Hepatitis serologies negative (in anticipation of Rituxan).  Echo on 01/18/2016 revealed an EF of 55-60%.  Vascular surgery consulted for port placement.  Per family, port to be placed on Monday, 01/21/2016.  2.  Hematology:  Patient has an acute thrombus in left lower extremity.  Eliquis switched to heparin to allow infusion to be held prior to upcoming biopsies on 01/21/2016.  IVC filter placed on 01/18/2016.  Plan to continue heparin over weekend with discontinuation on Sunday, 01/20/2016.   Thrombocytopenia on admission (platelet count 89,000) felt secondary to splenomegaly and possibly marrow disease.  Bone marrow biopsy on 01/21/2016.  Follow platelet count daily.  Discontinue heparin if platelets < 50,000.  3.  Nephrology:  Tumor lysis syndrome on IVF, alkalinization, and allopurinol.   Creatinine was 2.34 on admission with a uric acid of  12.7.  Creatinine has improved to 1.55.  Uric acid has improved to 7.8.  Check BMP, uric acid, phosphorus daily.  G6PD testing negative (ok to give rasburicase if needed).  Appreciate nephrology consult.  4.  Disposition:  Patient will remain  hospitalized until treatment (and a few days after) to manage tumor lysis.  Once stability documented, patient can be discharged.  5.  Code status:  Discussed with patient and family.  Full code.   Lequita Asal, MD  01/18/2016

## 2016-01-19 NOTE — Progress Notes (Signed)
[facility]  ROUNDING NOTE   Subjective:   Family at bedside.   IV fluids off   Heparin gtt  Objective:  Vital signs in last 24 hours:  Temp:  [98 F (36.7 C)-99.8 F (37.7 C)] 98 F (36.7 C) (11/18 0416) Pulse Rate:  [76-90] 85 (11/18 1000) Resp:  [16-36] 18 (11/18 1000) BP: (95-138)/(40-86) 119/86 (11/18 1000) SpO2:  [92 %-96 %] 93 % (11/18 1000)  Weight change:  Filed Weights   01/15/16 1430 01/15/16 1533 01/15/16 1613  Weight: 92.3 kg (203 lb 8 oz) 92.3 kg (203 lb 8 oz) 92.3 kg (203 lb 8 oz)    Intake/Output: I/O last 3 completed shifts: In: 1557.7 [P.O.:600; I.V.:957.7] Out: 400 [Urine:400]   Intake/Output this shift:  Total I/O In: 240 [P.O.:240] Out: -   Physical Exam: General: NAD,   Head: Normocephalic, atraumatic. Moist oral mucosal membranes  Eyes: Anicteric, PERRL  Neck: Supple, trachea midline  Lungs:  Clear to auscultation  Heart: Regular rate and rhythm  Abdomen:  Soft, nontender,   Extremities:  left extremity with 1+ edema, no right lower extremity edema  Neurologic: Nonfocal, moving all four extremities  Skin: No lesions       Basic Metabolic Panel:  Recent Labs Lab 01/14/16 1619 01/16/16 0445 01/17/16 0449 01/18/16 0346 01/19/16 0318  NA 133* 136 136 135 134*  K 5.5* 5.1 4.5 4.5 4.6  CL 102 109 110 108 110  CO2 22 20* 20* 19* 20*  GLUCOSE 198* 145* 110* 69 104*  BUN 46* 34* 27* 22* 19  CREATININE 2.34* 1.92* 1.68* 1.60*  1.55* 1.59*  CALCIUM 8.9 8.0* 7.8* 7.7* 7.6*  MG  --   --  1.5* 1.8  --   PHOS  --  2.4* 2.4* 2.3*  --     Liver Function Tests:  Recent Labs Lab 01/14/16 1619  AST 37  ALT 29  ALKPHOS 47  BILITOT 0.9  PROT 6.7  ALBUMIN 3.5   No results for input(s): LIPASE, AMYLASE in the last 168 hours. No results for input(s): AMMONIA in the last 168 hours.  CBC:  Recent Labs Lab 01/14/16 1619 01/16/16 0445 01/17/16 0449 01/17/16 1030 01/18/16 0346 01/18/16 0943 01/18/16 1320  01/19/16 0318  WBC 6.2 4.3  --  3.8  --   --  3.8 3.0*  NEUTROABS 4.4  --   --   --   --   --   --   --   HGB 15.6 13.5  --  12.6*  --  13.0 13.3 12.1*  HCT 48.1 41.5  --  38.5*  --   --  41.7 37.3*  MCV 77.8* 79.5*  --  78.4*  --   --  79.0* 78.4*  PLT 99* 92* 89* 87* 88*  --  102* 81*    Cardiac Enzymes: No results for input(s): CKTOTAL, CKMB, CKMBINDEX, TROPONINI in the last 168 hours.  BNP: Invalid input(s): POCBNP  CBG:  Recent Labs Lab 01/18/16 1128 01/18/16 1640 01/18/16 2134 01/19/16 0818 01/19/16 1147  GLUCAP 116* 85 122* 116* 127*    Microbiology: No results found for this or any previous visit.  Coagulation Studies:  Recent Labs  01/17/16 1030  LABPROT 19.8*  INR 1.66    Urinalysis: No results for input(s): COLORURINE, LABSPEC, PHURINE, GLUCOSEU, HGBUR, BILIRUBINUR, KETONESUR, PROTEINUR, UROBILINOGEN, NITRITE, LEUKOCYTESUR in the last 72 hours.  Invalid input(s): APPERANCEUR    Imaging: No results found.   Medications:   . [START ON  01/21/2016] sodium chloride    . heparin 1,100 Units/hr (01/18/16 2040)   . allopurinol  300 mg Oral Daily  . carvedilol  6.25 mg Oral BID WC  . insulin aspart  0-9 Units Subcutaneous TID WC  . insulin glargine  5 Units Subcutaneous QHS  . ketoconazole   Topical Daily  . pneumococcal 23 valent vaccine  0.5 mL Intramuscular Tomorrow-1000  . sodium bicarbonate  650 mg Oral BID   acetaminophen **OR** acetaminophen, HYDROcodone-acetaminophen, ondansetron **OR** ondansetron (ZOFRAN) IV  Assessment/ Plan:  [name] is a 82 y.o. white male with new onset lymphoma, hypertension, diabetes mellitus type II, hyperlipidemia, coronary artery disease, DVT on xarelto, BPH, RBB, who was admitted to [facility] on 01/15/2016 for tumor lysis syndrome renal insufficiency hyperuremia   1. Acute renal failure on chronic kidney disease stage III: with hyperkalemia and hyponatremia: baseline creatinine of 1.6, eGFR of 42  from 12/31/15.  CT angiogram on 11/6. Poor PO intake: albumin low at 3.5 - now off of IV fluids.   2. 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 Uric acid improved to 6.8 - Discontinued IV fluids - Discussed role of dialysis in acute renal failure with patient and family.  3. Metabolic acidosis: from NS and acute renal failure - sodium bicarbonate PO   LOS: [place], Gagandeep Kossman 11/18/201712:14 PM

## 2016-01-20 LAB — GASTROINTESTINAL PANEL BY PCR, STOOL (REPLACES STOOL CULTURE)
ASTROVIRUS: NOT DETECTED
Adenovirus F40/41: NOT DETECTED
CYCLOSPORA CAYETANENSIS: NOT DETECTED
Campylobacter species: NOT DETECTED
Cryptosporidium: NOT DETECTED
ENTAMOEBA HISTOLYTICA: NOT DETECTED
ENTEROAGGREGATIVE E COLI (EAEC): NOT DETECTED
ENTEROTOXIGENIC E COLI (ETEC): NOT DETECTED
Enteropathogenic E coli (EPEC): NOT DETECTED
GIARDIA LAMBLIA: NOT DETECTED
NOROVIRUS GI/GII: NOT DETECTED
Plesimonas shigelloides: NOT DETECTED
Rotavirus A: NOT DETECTED
SALMONELLA SPECIES: NOT DETECTED
Sapovirus (I, II, IV, and V): NOT DETECTED
Shiga like toxin producing E coli (STEC): NOT DETECTED
Shigella/Enteroinvasive E coli (EIEC): NOT DETECTED
VIBRIO CHOLERAE: NOT DETECTED
VIBRIO SPECIES: NOT DETECTED
Yersinia enterocolitica: NOT DETECTED

## 2016-01-20 LAB — URIC ACID: URIC ACID, SERUM: 6.2 mg/dL (ref 4.4–7.6)

## 2016-01-20 LAB — CBC
HCT: 41.4 % (ref 40.0–52.0)
HEMOGLOBIN: 13.7 g/dL (ref 13.0–18.0)
MCH: 26.4 pg (ref 26.0–34.0)
MCHC: 33.1 g/dL (ref 32.0–36.0)
MCV: 79.7 fL — ABNORMAL LOW (ref 80.0–100.0)
Platelets: 106 10*3/uL — ABNORMAL LOW (ref 150–440)
RBC: 5.19 MIL/uL (ref 4.40–5.90)
RDW: 20.2 % — ABNORMAL HIGH (ref 11.5–14.5)
WBC: 3.8 10*3/uL (ref 3.8–10.6)

## 2016-01-20 LAB — BASIC METABOLIC PANEL
ANION GAP: 9 (ref 5–15)
BUN: 18 mg/dL (ref 6–20)
CALCIUM: 8.1 mg/dL — AB (ref 8.9–10.3)
CO2: 18 mmol/L — ABNORMAL LOW (ref 22–32)
Chloride: 106 mmol/L (ref 101–111)
Creatinine, Ser: 1.6 mg/dL — ABNORMAL HIGH (ref 0.61–1.24)
GFR, EST AFRICAN AMERICAN: 45 mL/min — AB (ref >60)
GFR, EST NON AFRICAN AMERICAN: 38 mL/min — AB (ref >60)
Glucose, Bld: 122 mg/dL — ABNORMAL HIGH (ref 65–99)
POTASSIUM: 5 mmol/L (ref 3.5–5.1)
SODIUM: 133 mmol/L — AB (ref 135–145)

## 2016-01-20 LAB — C DIFFICILE QUICK SCREEN W PCR REFLEX
C DIFFICILE (CDIFF) TOXIN: NEGATIVE
C DIFFICLE (CDIFF) ANTIGEN: NEGATIVE
C Diff interpretation: NOT DETECTED

## 2016-01-20 LAB — PHOSPHORUS: Phosphorus: 2.7 mg/dL (ref 2.5–4.6)

## 2016-01-20 LAB — GLUCOSE, CAPILLARY
GLUCOSE-CAPILLARY: 108 mg/dL — AB (ref 65–99)
Glucose-Capillary: 111 mg/dL — ABNORMAL HIGH (ref 65–99)
Glucose-Capillary: 134 mg/dL — ABNORMAL HIGH (ref 65–99)
Glucose-Capillary: 121 mg/dL — ABNORMAL HIGH (ref 65–99)

## 2016-01-20 LAB — HEPARIN LEVEL (UNFRACTIONATED): HEPARIN UNFRACTIONATED: 0.48 [IU]/mL (ref 0.30–0.70)

## 2016-01-20 LAB — OSMOLALITY, URINE: Osmolality, Ur: 324 mOsm/kg (ref 300–900)

## 2016-01-20 MED ORDER — SODIUM CHLORIDE 0.9 % IV SOLN
INTRAVENOUS | Status: DC
  Administered 2016-01-20: 18:00:00 via INTRAVENOUS

## 2016-01-20 NOTE — Progress Notes (Signed)
0-815 - Patient has some loose/soft stool during the night. Dr.[place] ordered stool specimen for C-Diff and PCR. Sent to lab. Patient put on isolation precautions.

## 2016-01-20 NOTE — Progress Notes (Signed)
[facility] NAME: Hayden Martin    MR#:  [id number]  DATE OF BIRTH:  1931/06/07  SUBJECTIVE:  CHIEF COMPLAINT:  No chief complaint on file. The patient is a 82 year old Caucasian male with past medical history significant for history of suspected lymphoma, DVT, diabetes, essential hypertension, who presents to the hospital with complaints of elevated LDH, worsening renal failure, elevated uric acid level. He was admitted from oncology office as directed. Admit. He was complaining of feeling weak and tired and not being able to walk. Labs revealed creatinine of 2.34, hyperkalemia, potassium of 5.5, hyponatremia, sodium 133, elevated uric acid level to 12.7, patient was rehydrated, his creatinine improved to 1.92. Today. The patient was seen by oncologist and nephrologist, recommended to continue IV fluids, discussed the role of dialysis and acute renal failure this patient and family. He was initiated on her sodium bicarbonate because of acidosis. PET scan revealed lymphadenopathy in neck, chest, abdomen and pelvis, enlarged spleen, consistent with clinical diagnosis is lymphoma. Patient feels satisfactory today, denies any discomfort. Physical therapist recommended skilled nursing facility placement. Patient was seen by vascular surgeon and recommended IVC filter placement in view of bone marrow and lymph node biopsy and Port-A-Cath placement in the nearest future . Heparin is being continued, to be discontinued on Sunday in anticipation of Port-A-Cath placement and retroperitoneal lymph node biopsy as well as bone marrow biopsy. Patient feels good today, denies any significant pain, status post IVC filter placement 17th of November 2017 by Dr. Lucky Cowboy. No new changes, patient feels good today. Heparin is going to be discontinued at 6 PM today for procedure tomorrow morning.   Review of Systems  Constitutional: Negative for chills, fever and weight  loss.  HENT: Negative for congestion.   Eyes: Negative for blurred vision and double vision.  Respiratory: Negative for cough, sputum production, shortness of breath and wheezing.   Cardiovascular: Negative for chest pain, palpitations, orthopnea, leg swelling and PND.  Gastrointestinal: Negative for abdominal pain, blood in stool, constipation, diarrhea, nausea and vomiting.  Genitourinary: Negative for dysuria, frequency, hematuria and urgency.  Musculoskeletal: Negative for falls.  Neurological: Positive for weakness. Negative for dizziness, tremors, focal weakness and headaches.  Endo/Heme/Allergies: Does not bruise/bleed easily.  Psychiatric/Behavioral: Negative for depression. The patient does not have insomnia.     VITAL SIGNS: Blood pressure (!) 117/54, pulse 88, temperature 98.1 F (36.7 C), temperature source Oral, resp. rate 20, height 5' 11" (1.803 m), weight 92.3 kg (203 lb 8 oz), SpO2 95 %.  PHYSICAL EXAMINATION:   GENERAL:  82 y.o.-year-old patient lying in the bed with no acute distress. Comfortable, in good mood  EYES: Pupils equal, round, reactive to light and accommodation. No scleral icterus. Extraocular muscles intact.  HEENT: Head atraumatic, normocephalic. Oropharynx and nasopharynx clear.  NECK:  Supple, no jugular venous distention. No thyroid enlargement, no tenderness.  LUNGS: Normal breath sounds bilaterally, no wheezing, rales,rhonchi or crepitation. No use of accessory muscles of respiration.  CARDIOVASCULAR: S1, S2 normal. No murmurs, rubs, or gallops.  ABDOMEN: Soft, nontender, nondistended. Bowel sounds present. No organomegaly or mass.  EXTREMITIES: No pedal edema, cyanosis, or clubbing. Left lower extremity edema below the knee, right groin has dressing placed, no swelling, no bleeding, no pain on palpation. Peripheral pulses are palpable NEUROLOGIC: Cranial nerves II through XII are intact. Muscle strength 5/5 in all extremities. Sensation intact. Gait  not checked.  PSYCHIATRIC: The patient is alert and oriented x  3.  SKIN: No obvious rash, lesion, or ulcer.   ORDERS/RESULTS REVIEWED:   CBC  Recent Labs Lab 01/14/16 1619 01/16/16 0445  01/17/16 1030 01/18/16 0346 01/18/16 0943 01/18/16 1320 01/19/16 0318 01/20/16 0453  WBC 6.2 4.3  --  3.8  --   --  3.8 3.0* 3.8  HGB 15.6 13.5  --  12.6*  --  13.0 13.3 12.1* 13.7  HCT 48.1 41.5  --  38.5*  --   --  41.7 37.3* 41.4  PLT 99* 92*  < > 87* 88*  --  102* 81* 106*  MCV 77.8* 79.5*  --  78.4*  --   --  79.0* 78.4* 79.7*  MCH 25.2* 25.9*  --  25.7*  --   --  25.3* 25.5* 26.4  MCHC 32.4 32.5  --  32.7  --   --  32.0 32.5 33.1  RDW 19.0* 19.6*  --  20.1*  --   --  19.6* 20.5* 20.2*  LYMPHSABS 0.7*  --   --   --   --   --   --   --   --   MONOABS 0.8  --   --   --   --   --   --   --   --   EOSABS 0.2  --   --   --   --   --   --   --   --   BASOSABS 0.1  --   --   --   --   --   --   --   --   < > = values in this interval not displayed. ------------------------------------------------------------------------------------------------------------------  Chemistries   Recent Labs Lab 01/14/16 1619 01/16/16 0445 01/17/16 0449 01/18/16 0346 01/19/16 0318 01/20/16 0453  NA 133* 136 136 135 134* 133*  K 5.5* 5.1 4.5 4.5 4.6 5.0  CL 102 109 110 108 110 106  CO2 22 20* 20* 19* 20* 18*  GLUCOSE 198* 145* 110* 69 104* 122*  BUN 46* 34* 27* 22* 19 18  CREATININE 2.34* 1.92* 1.68* 1.60*  1.55* 1.59* 1.60*  CALCIUM 8.9 8.0* 7.8* 7.7* 7.6* 8.1*  MG  --   --  1.5* 1.8  --   --   AST 37  --   --   --   --   --   ALT 29  --   --   --   --   --   ALKPHOS 47  --   --   --   --   --   BILITOT 0.9  --   --   --   --   --    ------------------------------------------------------------------------------------------------------------------ estimated creatinine clearance is 41.3 mL/min (by C-G formula based on SCr of 1.6 mg/dL  (H)). ------------------------------------------------------------------------------------------------------------------ No results for input(s): TSH, T4TOTAL, T3FREE, THYROIDAB in the last 72 hours.  Invalid input(s): FREET3  Cardiac Enzymes No results for input(s): CKMB, TROPONINI, MYOGLOBIN in the last 168 hours.  Invalid input(s): CK ------------------------------------------------------------------------------------------------------------------ Invalid input(s): POCBNP ---------------------------------------------------------------------------------------------------------------  RADIOLOGY: No results found.  EKG:  Orders placed or performed in visit on 08/27/06  . EKG 12-Lead    ASSESSMENT AND PLAN:  Active Problems:   Tumor lysis syndrome #1. Acute on chronic renal failure due to tumor lysis syndrome, Creatinine has improved from 2.34, GFR of 24 to 1.6, GFR 38 today, continue IV fluids, alkalinization with bicarbonate, allopurinol, appreciate oncologist and nephrologist inputs, kidney function is stable, follow creatinine close.  G6PD test was negative #2 metabolic acidosis, continue  bicarbonate orally, bicarbonate level is better at 20, follow in the morning #3. Hyperkalemia, improved with hydration, bicarbonate #4. Hyponatremia, initially resolved with IV hydration, but sodium level is worsening now with hydration, get urinary osmolality, discontinue IV fluids if osmolality is high #5, hyperuricemia, improving with IV hydration, alkalinization and allopurinol, following uric acid levels daily, per oncology's recommendations #6. Suspected lymphoma, PET scan reveals lymphadenopathy in neck, chest, abdomen and pelvis, splenomegaly, consistent with clinical diagnosis of lymphoma, per radiologist, oncologist. The patient is to undergo retroperitoneal lymph node and bone marrow biopsy on Monday 20th of November 2017 and Port-A-Cath placement the same day. Heparin is to be stopped a  day before for procedure. Echocardiogram was normal with ejection fraction of 55-60%, hepatitis serologies were negative, planed treatment was discussed this patient by oncologist. #7, acute left lower extremity DVT, status post IVC filter placement 17th of November 2017 by Dr. dew , now back on heparin intravenously, off Eliquis , oncologist recommends to discontinue heparin if patient platelet count is below 50,000.  Platelet count has improved to 106 today #8. Hypoglycemia and diabetic, blood glucose levels are stable with decreased insulin Lantus dose, continue sliding scale, follow oral intake, added Marinol  Management plans discussed with the patient, family and they are in agreement.   DRUG ALLERGIES:  Allergies  Allergen Reactions  . Codeine Itching  . Penicillins Itching    CODE STATUS:     Code Status Orders        Start     Ordered   01/15/16 1512  Full code  Continuous     01/15/16 1511    Code Status History    Date Active Date Inactive Code Status Order ID Comments User Context   This patient has a current code status but no historical code status.    Advance Directive Documentation   [place] Most Recent Value  Type of Advance Directive  Living will  Pre-existing out of facility DNR order (yellow form or pink MOST form)  No data  "MOST" Form in Place?  No data      TOTAL TIME TAKING CARE OF THIS PATIENT: 30 minutes.    Theodoro Grist M.D on 01/20/2016 at 12:50 PM  Between 7am to 6pm - Pager - [phone]  After 6pm go to [email or web address] - password [email or web address] [facility]  Office  [phone]  CC: Primary care physician; Kirk Ruths., MD

## 2016-01-20 NOTE — Progress Notes (Signed)
C-Diff result negative. Verified results with D. Heslep RN - Isolation discontinued.

## 2016-01-20 NOTE — Progress Notes (Signed)
Patient spent day with family in room. Sat out in chair this afternoon. Heparin infusing at 51ml/hr - will discontinue at 1800 per orders. VS are WNL. No complaints voiced, no distress noted. Patient will be NPO after midnight for procedure in am. Consent for port placement signed.

## 2016-01-20 NOTE — Progress Notes (Signed)
ANTICOAGULATION CONSULT NOTE - Initial Consult  Pharmacy Consult for heparin drip Indication: DVT  Allergies  Allergen Reactions  . Codeine Itching  . Penicillins Itching    Patient Measurements: Height: 5\' 11"  (180.3 cm) Weight: 203 lb 8 oz (92.3 kg) IBW/kg (Calculated) : 75.3 Heparin Dosing Weight: 92 kg  Vital Signs: Temp: 98.1 F (36.7 C) (11/19 0450) Temp Source: Oral (11/19 0450) BP: 129/52 (11/19 0450) Pulse Rate: 93 (11/19 0450)  Labs:  Recent Labs  01/17/16 1030  01/18/16 0346  01/18/16 1320 01/19/16 0318 01/20/16 0453  HGB 12.6*  --   --   < > 13.3 12.1* 13.7  HCT 38.5*  --   --   --  41.7 37.3* 41.4  PLT 87*  --  88*  --  102* 81* 106*  APTT 34  < > >160*  --  96* 85*  --   LABPROT 19.8*  --   --   --   --   --   --   INR 1.66  --   --   --   --   --   --   HEPARINUNFRC 1.82*  --  1.37*  --   --  0.58 0.48  CREATININE  --   --  1.60*  1.55*  --   --  1.59*  --   < > = values in this interval not displayed.  Estimated Creatinine Clearance: 41.6 mL/min (by C-G formula based on SCr of 1.59 mg/dL (H)).   Medical History: Past Medical History:  Diagnosis Date  . Clotting disorder ([place])   . Diabetes mellitus without complication ([place])   . Heart disease   . Hypertension    Medications:  Scheduled:  . allopurinol  300 mg Oral Daily  . carvedilol  6.25 mg Oral BID WC  . dronabinol  2.5 mg Oral BID AC  . insulin aspart  0-9 Units Subcutaneous TID WC  . insulin glargine  5 Units Subcutaneous QHS  . ketoconazole   Topical Daily  . pneumococcal 23 valent vaccine  0.5 mL Intramuscular Tomorrow-1000  . sodium bicarbonate  650 mg Oral BID   Assessment: Pharmacy consulted to dose and monitor heparin drip in this 82 year old male admitted for tumor lysis syndrome and diagnosed with an acute lower extremity DVT.   Patient was taking rivaroxaban prior to admission for a history of DVT and was converted to apixaban on admission due to ARF and CrCl < 30  mL/min. Patient has received 2 doses of apixaban during admission - last dose at 2206 on 11/15.   Baseline labs (CBC, INR, APTT, and heparin level) were obtained  Heparin drip will need to be monitored/adjusted using APTT since patient has been prescribed NOACs. Once APTT and heparin level correlate, can monitor using heparin level.  Goal of Therapy:  Heparin level 0.3-0.7 units/ml aPTT 66 - 102 seconds Monitor platelets by anticoagulation protocol: Yes   Plan:  11/17 @ 1320 APTT = 96 seconds (therapeutic) on a rate of 1100 units/hr.  Heparin drip order was discontinued at 1409 today - patient is currently off of the floor. Spoke with RN who reports heparin was stopped when patient went for IVC filter placement.   11/17 1820 MD consult to restart Heparin drip. Will restart at 1100 units/hr. Will check aPTT/ Heparin level in 8 hrs. F/u aPTT and HL till they correlate (HL just once daily).  11/18 AM aPTT 85 and heparin level 0.58. Since levels appear to be  correlating, will follow by heparin level only. Next labs tomorrow AM.  11/19 AM heparin level 0.48. Continue current regimen. Recheck heparin level and CBC tomorrow AM.   Eloise Harman, PharmD  Clinical Pharmacist 01/20/2016,6:01 AM

## 2016-01-20 NOTE — Progress Notes (Signed)
1800 - Heparin discontinued per order and NS started at 27ml/hr.

## 2016-01-21 ENCOUNTER — Inpatient Hospital Stay: Payer: Medicare Other | Admitting: Hematology and Oncology

## 2016-01-21 ENCOUNTER — Encounter: Payer: Self-pay | Admitting: Vascular Surgery

## 2016-01-21 ENCOUNTER — Encounter
Admission: AD | Disposition: A | Discharge status: Skilled Nursing Facility | Payer: Self-pay | Source: Ambulatory Visit | Attending: Specialist

## 2016-01-21 ENCOUNTER — Encounter: Payer: Self-pay | Admitting: Hematology and Oncology

## 2016-01-21 ENCOUNTER — Inpatient Hospital Stay: Payer: Medicare Other

## 2016-01-21 ENCOUNTER — Telehealth: Payer: Self-pay | Admitting: Interventional Radiology

## 2016-01-21 DIAGNOSIS — N289 Disorder of kidney and ureter, unspecified: Secondary | ICD-10-CM

## 2016-01-21 DIAGNOSIS — C8590 Non-Hodgkin lymphoma, unspecified, unspecified site: Secondary | ICD-10-CM

## 2016-01-21 DIAGNOSIS — I824Z2 Acute embolism and thrombosis of unspecified deep veins of left distal lower extremity: Secondary | ICD-10-CM

## 2016-01-21 DIAGNOSIS — C8330 Diffuse large B-cell lymphoma, unspecified site: Secondary | ICD-10-CM | POA: Insufficient documentation

## 2016-01-21 DIAGNOSIS — Z7901 Long term (current) use of anticoagulants: Secondary | ICD-10-CM

## 2016-01-21 DIAGNOSIS — I82432 Acute embolism and thrombosis of left popliteal vein: Secondary | ICD-10-CM

## 2016-01-21 HISTORY — PX: PERIPHERAL VASCULAR CATHETERIZATION: SHX172C

## 2016-01-21 LAB — PROTIME-INR
INR: 1.37
Prothrombin Time: 17 seconds — ABNORMAL HIGH (ref 11.4–15.2)

## 2016-01-21 LAB — CREATININE, SERUM
CREATININE: 1.56 mg/dL — AB (ref 0.61–1.24)
GFR calc Af Amer: 46 mL/min — ABNORMAL LOW (ref >60)
GFR calc non Af Amer: 40 mL/min — ABNORMAL LOW (ref >60)

## 2016-01-21 LAB — DIFFERENTIAL
BASOS ABS: 0 10*3/uL (ref 0–0.1)
Basophils Relative: 1 %
Eosinophils Absolute: 0.1 10*3/uL (ref 0–0.7)
Eosinophils Relative: 3 %
LYMPHS ABS: 0.5 10*3/uL — AB (ref 1.0–3.6)
LYMPHS PCT: 15 %
MONOS PCT: 15 %
Monocytes Absolute: 0.5 10*3/uL (ref 0.2–1.0)
NEUTROS ABS: 2.2 10*3/uL (ref 1.4–6.5)
Neutrophils Relative %: 66 %

## 2016-01-21 LAB — CBC
HCT: 37.2 % — ABNORMAL LOW (ref 40.0–52.0)
Hemoglobin: 12.4 g/dL — ABNORMAL LOW (ref 13.0–18.0)
MCH: 25.9 pg — ABNORMAL LOW (ref 26.0–34.0)
MCHC: 33.3 g/dL (ref 32.0–36.0)
MCV: 77.8 fL — AB (ref 80.0–100.0)
PLATELETS: 84 10*3/uL — AB (ref 150–440)
RBC: 4.78 MIL/uL (ref 4.40–5.90)
RDW: 20 % — ABNORMAL HIGH (ref 11.5–14.5)
WBC: 3.2 10*3/uL — AB (ref 3.8–10.6)

## 2016-01-21 LAB — APTT: APTT: 34 s (ref 24–36)

## 2016-01-21 LAB — SODIUM: SODIUM: 134 mmol/L — AB (ref 135–145)

## 2016-01-21 LAB — URIC ACID: URIC ACID, SERUM: 5.9 mg/dL (ref 4.4–7.6)

## 2016-01-21 LAB — GLUCOSE, CAPILLARY
GLUCOSE-CAPILLARY: 137 mg/dL — AB (ref 65–99)
GLUCOSE-CAPILLARY: 119 mg/dL — AB (ref 65–99)
Glucose-Capillary: 88 mg/dL (ref 65–99)

## 2016-01-21 LAB — CO2, TOTAL: CO2: 20 mmol/L — AB (ref 22–32)

## 2016-01-21 LAB — HEPARIN LEVEL (UNFRACTIONATED): Heparin Unfractionated: <0.1 IU/mL — ABNORMAL LOW (ref 0.30–0.70)

## 2016-01-21 LAB — PHOSPHORUS: Phosphorus: 3.2 mg/dL (ref 2.5–4.6)

## 2016-01-21 SURGERY — PORTA CATH INSERTION
Anesthesia: Moderate Sedation

## 2016-01-21 MED ORDER — MIDAZOLAM HCL 5 MG/5ML IJ SOLN
INTRAMUSCULAR | Status: AC
  Filled 2016-01-21: qty 5

## 2016-01-21 MED ORDER — FENTANYL CITRATE (PF) 100 MCG/2ML IJ SOLN
INTRAMUSCULAR | Status: AC
  Filled 2016-01-21: qty 2

## 2016-01-21 MED ORDER — SODIUM CHLORIDE 0.9 % IR SOLN
Freq: Once | Status: DC
  Filled 2016-01-21: qty 2

## 2016-01-21 MED ORDER — HEPARIN SOD (PORK) LOCK FLUSH 100 UNIT/ML IV SOLN
INTRAVENOUS | Status: AC
  Filled 2016-01-21: qty 5

## 2016-01-21 MED ORDER — HEPARIN (PORCINE) IN NACL 100-0.45 UNIT/ML-% IJ SOLN
1300.0000 [IU]/h | INTRAMUSCULAR | Status: AC
  Administered 2016-01-22: 02:00:00 1100 [IU]/h via INTRAVENOUS
  Filled 2016-01-21 (×2): qty 250

## 2016-01-21 MED ORDER — LIDOCAINE-EPINEPHRINE 1 %-1:100000 IJ SOLN
INTRAMUSCULAR | Status: AC
  Filled 2016-01-21: qty 1

## 2016-01-21 MED ORDER — HEPARIN (PORCINE) IN NACL 2-0.9 UNIT/ML-% IJ SOLN
INTRAMUSCULAR | Status: AC
  Filled 2016-01-21: qty 500

## 2016-01-21 MED ORDER — CLINDAMYCIN PHOSPHATE 300 MG/50ML IV SOLN
INTRAVENOUS | Status: AC
  Administered 2016-01-21: 14:00:00
  Filled 2016-01-21: qty 50

## 2016-01-21 MED ORDER — MIDAZOLAM HCL 5 MG/5ML IJ SOLN
INTRAMUSCULAR | Status: AC
  Filled 2016-01-21: qty 10

## 2016-01-21 MED ORDER — MIDAZOLAM HCL 2 MG/2ML IJ SOLN
INTRAMUSCULAR | Status: DC | PRN
  Administered 2016-01-21: 2 mg via INTRAVENOUS

## 2016-01-21 MED ORDER — FENTANYL CITRATE (PF) 100 MCG/2ML IJ SOLN
INTRAMUSCULAR | Status: DC | PRN
  Administered 2016-01-21: 50 ug via INTRAVENOUS

## 2016-01-21 MED ORDER — HEPARIN (PORCINE) IN NACL 100-0.45 UNIT/ML-% IJ SOLN
1100.0000 [IU]/h | INTRAMUSCULAR | Status: DC
  Filled 2016-01-21: qty 250

## 2016-01-21 MED ORDER — FENTANYL CITRATE (PF) 100 MCG/2ML IJ SOLN
INTRAMUSCULAR | Status: AC
  Filled 2016-01-21: qty 4

## 2016-01-21 MED ORDER — FENTANYL CITRATE (PF) 100 MCG/2ML IJ SOLN
INTRAMUSCULAR | Status: AC | PRN
  Administered 2016-01-21: 50 ug via INTRAVENOUS

## 2016-01-21 MED ORDER — MIDAZOLAM HCL 5 MG/5ML IJ SOLN
INTRAMUSCULAR | Status: AC | PRN
  Administered 2016-01-21: 0.5 mg via INTRAVENOUS
  Administered 2016-01-21: 1 mg via INTRAVENOUS

## 2016-01-21 SURGICAL SUPPLY — 10 items
BAG DECANTER STRL (MISCELLANEOUS) ×3 IMPLANT
KIT PORT POWER 8FR ISP CVUE (Catheter) ×3 IMPLANT
PACK ANGIOGRAPHY (CUSTOM PROCEDURE TRAY) ×3 IMPLANT
PAD GROUND ADULT SPLIT (MISCELLANEOUS) ×3 IMPLANT
PENCIL ELECTRO HAND CTR (MISCELLANEOUS) ×3 IMPLANT
PREP CHG 10.5 TEAL (MISCELLANEOUS) ×3 IMPLANT
SUT MNCRL AB 4-0 PS2 18 (SUTURE) ×3 IMPLANT
SUT PROLENE 0 CT 1 30 (SUTURE) ×3 IMPLANT
SUTURE VIC 3-0 (SUTURE) ×3 IMPLANT
TOWEL OR 17X26 4PK STRL BLUE (TOWEL DISPOSABLE) ×3 IMPLANT

## 2016-01-21 NOTE — Procedures (Signed)
Technically successful CT guided bone marrow aspiration and biopsy of right iliac crest. Technically successful CT guided L sided retroperitoneal LN Bx. EBL: None No immediate complications.    SignedSandi Mariscal [phone] 01/21/2016, 10:59 AM

## 2016-01-21 NOTE — Progress Notes (Signed)
PT Cancellation Note  Patient Details Name: Hayden Martin MRN: [id number] DOB: 1932-02-17   Cancelled Treatment:    Reason Eval/Treat Not Completed: Other (comment).  Pt currently on bedrest s/p procedure.  Will re-attempt PT treatment at a later date/time.   Raquel Sarna Sayward Horvath 01/21/2016, 1:17 PM Leitha Bleak, [place]

## 2016-01-21 NOTE — Progress Notes (Signed)
ANTICOAGULATION CONSULT NOTE - Initial Consult  Pharmacy Consult for heparin drip Indication: DVT  Allergies  Allergen Reactions  . Codeine Itching  . Penicillins Itching    Patient Measurements: Height: 5\' 11"  (180.3 cm) Weight: 203 lb 8 oz (92.3 kg) IBW/kg (Calculated) : 75.3 Heparin Dosing Weight: 92 kg  Vital Signs: Temp: 98.1 F (36.7 C) (11/20 0428) Temp Source: Oral (11/20 0428) BP: 112/40 (11/20 0428) Pulse Rate: 88 (11/20 0428)  Labs:  Recent Labs  01/18/16 1320 01/19/16 0318 01/20/16 0453 01/21/16 0447  HGB 13.3 12.1* 13.7 12.4*  HCT 41.7 37.3* 41.4 37.2*  PLT 102* 81* 106* 84*  APTT 96* 85*  --   --   HEPARINUNFRC  --  0.58 0.48 <0.10*  CREATININE  --  1.59* 1.60* 1.56*    Estimated Creatinine Clearance: 42.4 mL/min (by C-G formula based on SCr of 1.56 mg/dL (H)).   Medical History: Past Medical History:  Diagnosis Date  . Clotting disorder ([place])   . Diabetes mellitus without complication ([place])   . Heart disease   . Hypertension    Medications:  Scheduled:  . allopurinol  300 mg Oral Daily  . carvedilol  6.25 mg Oral BID WC  . dronabinol  2.5 mg Oral BID AC  . insulin aspart  0-9 Units Subcutaneous TID WC  . insulin glargine  5 Units Subcutaneous QHS  . ketoconazole   Topical Daily  . pneumococcal 23 valent vaccine  0.5 mL Intramuscular Tomorrow-1000  . sodium bicarbonate  650 mg Oral BID   Assessment: Pharmacy consulted to dose and monitor heparin drip in this 82 year old male admitted for tumor lysis syndrome and diagnosed with an acute lower extremity DVT.   Patient was taking rivaroxaban prior to admission for a history of DVT and was converted to apixaban on admission due to ARF and CrCl < 30 mL/min. Patient has received 2 doses of apixaban during admission - last dose at 2206 on 11/15.   Baseline labs (CBC, INR, APTT, and heparin level) were obtained  Heparin drip will need to be monitored/adjusted using APTT since patient has been  prescribed NOACs. Once APTT and heparin level correlate, can monitor using heparin level.  Goal of Therapy:  Heparin level 0.3-0.7 units/ml aPTT 66 - 102 seconds Monitor platelets by anticoagulation protocol: Yes   Plan:  11/17 @ 1320 APTT = 96 seconds (therapeutic) on a rate of 1100 units/hr.  Heparin drip order was discontinued at 1409 today - patient is currently off of the floor. Spoke with RN who reports heparin was stopped when patient went for IVC filter placement.   11/17 1820 MD consult to restart Heparin drip. Will restart at 1100 units/hr. Will check aPTT/ Heparin level in 8 hrs. F/u aPTT and HL till they correlate (HL just once daily).  11/18 AM aPTT 85 and heparin level 0.58. Since levels appear to be correlating, will follow by heparin level only. Next labs tomorrow AM.  11/19 AM heparin level 0.48. Continue current regimen. Recheck heparin level and CBC tomorrow AM.  11/20 AM heparin level <0.1. Drip off per RN note.  Eloise Harman, PharmD  Clinical Pharmacist 01/21/2016,5:32 AM

## 2016-01-21 NOTE — Progress Notes (Signed)
[facility] Hematology/Oncology Progress Note  Date of admission: 01/15/2016  Hospital day:  01/21/2016  Chief Complaint: Hayden Martin is a 82 y.o. 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  Subjective:  Denies any complaints.  Tired after a day of procedures.  Patient was up in chair yesterday.  Social History: The patient is accompanied by numerous family members today.  Allergies:  Allergies  Allergen Reactions  . Codeine Itching  . Penicillins Itching    Scheduled Medications: . allopurinol  300 mg Oral Daily  . carvedilol  6.25 mg Oral BID WC  . dronabinol  2.5 mg Oral BID AC  . fentaNYL      . gentamicin irrigation   Irrigation Once  . heparin lock flush      . insulin aspart  0-9 Units Subcutaneous TID WC  . insulin glargine  5 Units Subcutaneous QHS  . ketoconazole   Topical Daily  . midazolam      . pneumococcal 23 valent vaccine  0.5 mL Intramuscular Tomorrow-1000  . sodium bicarbonate  650 mg Oral BID    Review of Systems: GENERAL: Fatigue after a day of procedures. No fevers or sweats. Weight loss of 30-35 pounds in 2-3 months. PERFORMANCE STATUS (ECOG): 2 HEENT: No visual changes, runny nose, sore throat, mouth sores or tenderness. Lungs: No shortness of breath. Little non-productive cough. No hemoptysis. Cardiac: No chest pain, palpitations, orthopnea, or PND. GI: Poor appetite. No nausea, vomiting, diarrhea, constipation, or hematochezia. GU: No urgency, frequency, dysuria, or hematuria. Musculoskeletal: Back issues. Joints hurt. No muscle tenderness. Extremities: No pain or swelling. Skin: Pruritus. No rashes or skin changes. Neuro: General weakness.  No headache, numbness or weakness, or coordination issues. Balance issues. Endocrine: Diabetes. No thyroid issues, hot flashes or night sweats. Psych: No mood changes, depression or anxiety. Pain: No focal pain. Review of  systems:  All other systems reviewed and found to be negative.  Physical Exam: Blood pressure (!) 100/41, pulse 83, temperature 98.2 F (36.8 C), resp. rate 16, height 5' 11"  (1.803 m), weight 203 lb 8 oz (92.3 kg), SpO2 (!) 89 %.  GENERAL:Elderly gentleman lying comfortably on the medical unit in no acute distress. MENTAL STATUS: Alert and oriented to person, place and time. HEAD:Gray hair. Male pattern baldness. Normocephalic, atraumatic, face symmetric, no Cushingoid features. EYES:Blueeyes. Pupils equal round and reactive to light and accomodation. No conjunctivitis or scleral icterus. AOZ:HYQMVHQ aide. Oropharynx clear without lesion. Tonguenormal. Mucous membranes moist. RESPIRATORY: Clear to auscultationwithout rales, wheezes or rhonchi. CARDIOVASCULAR:Regular rate andrhythmwithout murmur, rub or gallop. ABDOMEN:Soft, non-tender, with active bowel sounds, and no hepatomegaly. Left upper quadrant fullness. No masses. SKIN: No rashes, ulcers or lesions. EXTREMITIES: Left lower extremity edema below the knee. Noskin discoloration or tenderness. No palpable cords. LYMPHNODES: No palpable cervical, supraclavicular, axillary or inguinal adenopathy  NEUROLOGICAL: Unremarkable. PSYCH: Appropriate.   Results for orders placed or performed during the hospital encounter of 01/15/16 (from the past 48 hour(s))  CBC     Status: Abnormal   Collection Time: 01/20/16  4:53 AM  Result Value Ref Range   WBC 3.8 3.8 - 10.6 K/uL   RBC 5.19 4.40 - 5.90 MIL/uL   Hemoglobin 13.7 13.0 - 18.0 g/dL   HCT 41.4 40.0 - 52.0 %   MCV 79.7 (L) 80.0 - 100.0 fL   MCH 26.4 26.0 - 34.0 pg   MCHC 33.1 32.0 - 36.0 g/dL   RDW 20.2 (H) 11.5 - 14.5 %  Platelets 106 (L) 150 - 440 K/uL  Heparin level (unfractionated)     Status: None   Collection Time: 01/20/16  4:53 AM  Result Value Ref Range   Heparin Unfractionated 0.48 0.30 - 0.70 IU/mL    Comment:        IF HEPARIN RESULTS  ARE BELOW EXPECTED VALUES, AND PATIENT DOSAGE HAS BEEN CONFIRMED, SUGGEST FOLLOW UP TESTING OF ANTITHROMBIN III LEVELS.   Basic metabolic panel     Status: Abnormal   Collection Time: 01/20/16  4:53 AM  Result Value Ref Range   Sodium 133 (L) 135 - 145 mmol/L   Potassium 5.0 3.5 - 5.1 mmol/L   Chloride 106 101 - 111 mmol/L   CO2 18 (L) 22 - 32 mmol/L   Glucose, Bld 122 (H) 65 - 99 mg/dL   BUN 18 6 - 20 mg/dL   Creatinine, Ser 1.60 (H) 0.61 - 1.24 mg/dL   Calcium 8.1 (L) 8.9 - 10.3 mg/dL   GFR calc non Af Amer 38 (L) >60 mL/min   GFR calc Af Amer 45 (L) >60 mL/min    Comment: (NOTE) The eGFR has been calculated using the CKD EPI equation. This calculation has not been validated in all clinical situations. eGFR's persistently <60 mL/min signify possible Chronic Kidney Disease.    Anion gap 9 5 - 15  Uric acid     Status: None   Collection Time: 01/20/16  4:53 AM  Result Value Ref Range   Uric Acid, Serum 6.2 4.4 - 7.6 mg/dL  Phosphorus     Status: None   Collection Time: 01/20/16  4:53 AM  Result Value Ref Range   Phosphorus 2.7 2.5 - 4.6 mg/dL  Glucose, capillary     Status: Abnormal   Collection Time: 01/20/16  7:39 AM  Result Value Ref Range   Glucose-Capillary 108 (H) 65 - 99 mg/dL  C difficile quick scan w PCR reflex     Status: None   Collection Time: 01/20/16  8:05 AM  Result Value Ref Range   C Diff antigen NEGATIVE NEGATIVE   C Diff toxin NEGATIVE NEGATIVE   C Diff interpretation No C. difficile detected.   Gastrointestinal Panel by PCR , Stool     Status: None   Collection Time: 01/20/16  8:05 AM  Result Value Ref Range   Campylobacter species NOT DETECTED NOT DETECTED   Plesimonas shigelloides NOT DETECTED NOT DETECTED   Salmonella species NOT DETECTED NOT DETECTED   Yersinia enterocolitica NOT DETECTED NOT DETECTED   Vibrio species NOT DETECTED NOT DETECTED   Vibrio cholerae NOT DETECTED NOT DETECTED   Enteroaggregative E coli (EAEC) NOT DETECTED NOT  DETECTED   Enteropathogenic E coli (EPEC) NOT DETECTED NOT DETECTED   Enterotoxigenic E coli (ETEC) NOT DETECTED NOT DETECTED   Shiga like toxin producing E coli (STEC) NOT DETECTED NOT DETECTED   Shigella/Enteroinvasive E coli (EIEC) NOT DETECTED NOT DETECTED   Cryptosporidium NOT DETECTED NOT DETECTED   Cyclospora cayetanensis NOT DETECTED NOT DETECTED   Entamoeba histolytica NOT DETECTED NOT DETECTED   Giardia lamblia NOT DETECTED NOT DETECTED   Adenovirus F40/41 NOT DETECTED NOT DETECTED   Astrovirus NOT DETECTED NOT DETECTED   Norovirus GI/GII NOT DETECTED NOT DETECTED   Rotavirus A NOT DETECTED NOT DETECTED   Sapovirus (I, II, IV, and V) NOT DETECTED NOT DETECTED  Glucose, capillary     Status: Abnormal   Collection Time: 01/20/16 11:52 AM  Result Value Ref Range   Glucose-Capillary  111 (H) 65 - 99 mg/dL  Osmolality, urine     Status: None   Collection Time: 01/20/16  2:59 PM  Result Value Ref Range   Osmolality, Ur 324 300 - 900 mOsm/kg  Glucose, capillary     Status: Abnormal   Collection Time: 01/20/16  4:29 PM  Result Value Ref Range   Glucose-Capillary 134 (H) 65 - 99 mg/dL  Glucose, capillary     Status: Abnormal   Collection Time: 01/20/16  8:52 PM  Result Value Ref Range   Glucose-Capillary 121 (H) 65 - 99 mg/dL  CBC     Status: Abnormal   Collection Time: 01/21/16  4:47 AM  Result Value Ref Range   WBC 3.2 (L) 3.8 - 10.6 K/uL   RBC 4.78 4.40 - 5.90 MIL/uL   Hemoglobin 12.4 (L) 13.0 - 18.0 g/dL   HCT 37.2 (L) 40.0 - 52.0 %   MCV 77.8 (L) 80.0 - 100.0 fL   MCH 25.9 (L) 26.0 - 34.0 pg   MCHC 33.3 32.0 - 36.0 g/dL   RDW 20.0 (H) 11.5 - 14.5 %   Platelets 84 (L) 150 - 440 K/uL  Heparin level (unfractionated)     Status: Abnormal   Collection Time: 01/21/16  4:47 AM  Result Value Ref Range   Heparin Unfractionated <0.10 (L) 0.30 - 0.70 IU/mL    Comment:        IF HEPARIN RESULTS ARE BELOW EXPECTED VALUES, AND PATIENT DOSAGE HAS BEEN CONFIRMED, SUGGEST FOLLOW  UP TESTING OF ANTITHROMBIN III LEVELS. RESULT REPEATED AND VERIFIED   Uric acid     Status: None   Collection Time: 01/21/16  4:47 AM  Result Value Ref Range   Uric Acid, Serum 5.9 4.4 - 7.6 mg/dL  Sodium     Status: Abnormal   Collection Time: 01/21/16  4:47 AM  Result Value Ref Range   Sodium 134 (L) 135 - 145 mmol/L  Creatinine, serum     Status: Abnormal   Collection Time: 01/21/16  4:47 AM  Result Value Ref Range   Creatinine, Ser 1.56 (H) 0.61 - 1.24 mg/dL   GFR calc non Af Amer 40 (L) >60 mL/min   GFR calc Af Amer 46 (L) >60 mL/min    Comment: (NOTE) The eGFR has been calculated using the CKD EPI equation. This calculation has not been validated in all clinical situations. eGFR's persistently <60 mL/min signify possible Chronic Kidney Disease.   CO2, total     Status: Abnormal   Collection Time: 01/21/16  4:47 AM  Result Value Ref Range   CO2 20 (L) 22 - 32 mmol/L  Phosphorus     Status: None   Collection Time: 01/21/16  4:47 AM  Result Value Ref Range   Phosphorus 3.2 2.5 - 4.6 mg/dL  Differential     Status: Abnormal   Collection Time: 01/21/16  4:47 AM  Result Value Ref Range   Neutrophils Relative % 66 %   Neutro Abs 2.2 1.4 - 6.5 K/uL   Lymphocytes Relative 15 %   Lymphs Abs 0.5 (L) 1.0 - 3.6 K/uL   Monocytes Relative 15 %   Monocytes Absolute 0.5 0.2 - 1.0 K/uL   Eosinophils Relative 3 %   Eosinophils Absolute 0.1 0 - 0.7 K/uL   Basophils Relative 1 %   Basophils Absolute 0.0 0 - 0.1 K/uL  Protime-INR     Status: Abnormal   Collection Time: 01/21/16  4:47 AM  Result Value Ref Range  Prothrombin Time 17.0 (H) 11.4 - 15.2 seconds   INR 1.37   APTT     Status: None   Collection Time: 01/21/16  4:47 AM  Result Value Ref Range   aPTT 34 24 - 36 seconds  Glucose, capillary     Status: None   Collection Time: 01/21/16  7:33 AM  Result Value Ref Range   Glucose-Capillary 88 65 - 99 mg/dL  Glucose, capillary     Status: Abnormal   Collection Time:  01/21/16  5:16 PM  Result Value Ref Range   Glucose-Capillary 137 (H) 65 - 99 mg/dL   Ct Biopsy  Result Date: 01/21/2016 INDICATION: Concern for lymphoma. Please perform CT-guided retroperitoneal lymph known biopsy and CT-guided bone marrow biopsy for tissue diagnostic purposes. EXAM: 1. CT-GUIDED BONE MARROW BIOPSY AND ASPIRATION 2. CT-GUIDED LEFT-SIDED RETROPERITONEAL MASS BIOPSY. COMPARISON:  PET-CT - 01/16/2016 MEDICATIONS: None ANESTHESIA/SEDATION: Fentanyl 50 mcg IV; Versed 1.5 mg IV Sedation Time: 30 minutes; The patient was continuously monitored during the procedure by the interventional radiology nurse under my direct supervision. COMPLICATIONS: None immediate. PROCEDURE: Informed consent was obtained from the patient following an explanation of the procedure, risks, benefits and alternatives. The patient understands, agrees and consents for the procedure. All questions were addressed. A time out was performed prior to the initiation of the procedure. The patient was positioned prone and non-contrast localization CT was performed of the pelvis to demonstrate the iliac marrow spaces as well as the left-sided retroperitoneal nodal conglomeration with dominant component measuring approximately 3.4 x 4.2 cm (image 36, series 2). Both operative sites were prepped and draped in the usual sterile fashion. Under sterile conditions and local anesthesia, a 22 gauge spinal needle was utilized for procedural planning. Next, an 11 gauge coaxial bone biopsy needle was advanced into the right iliac marrow space. Needle position was confirmed with CT imaging. Initially, bone marrow aspiration was performed. Next, a bone marrow biopsy was obtained with the 11 gauge outer bone marrow device. Samples were prepared with the cytotechnologist and deemed adequate. The needle was removed intact. Attention was now paid towards the retroperitoneal lymph node biopsy. A 22 gauge needle was utilized for procedural planning  purposes. Next, a 28 gauge coaxial needle was advanced into the posterior peripheral aspect the left-sided retroperitoneal nodal conglomeration. Appropriate position was confirmed and 4 core needle biopsies were obtained with a 18 gauge biopsy device. Samples were placed on Telfa and submitted to pathology for analysis. The coaxial needle was removed and superficial hemostasis was achieved with manual compression. Dressings were placed. The patient tolerated procedure well without immediate postprocedural complication. IMPRESSION: 1. Technically successful CT guided right iliac bone marrow aspiration and core biopsy. 2. Technically successful CT-guided left-sided retroperitoneal nodal conglomeration core needle biopsy. Electronically Signed   By: Sandi Mariscal M.D.   On: 01/21/2016 11:24   Ct Biopsy  Result Date: 01/21/2016 INDICATION: Concern for lymphoma. Please perform CT-guided retroperitoneal lymph known biopsy and CT-guided bone marrow biopsy for tissue diagnostic purposes. EXAM: 1. CT-GUIDED BONE MARROW BIOPSY AND ASPIRATION 2. CT-GUIDED LEFT-SIDED RETROPERITONEAL MASS BIOPSY. COMPARISON:  PET-CT - 01/16/2016 MEDICATIONS: None ANESTHESIA/SEDATION: Fentanyl 50 mcg IV; Versed 1.5 mg IV Sedation Time: 30 minutes; The patient was continuously monitored during the procedure by the interventional radiology nurse under my direct supervision. COMPLICATIONS: None immediate. PROCEDURE: Informed consent was obtained from the patient following an explanation of the procedure, risks, benefits and alternatives. The patient understands, agrees and consents for the procedure. All questions were addressed.  A time out was performed prior to the initiation of the procedure. The patient was positioned prone and non-contrast localization CT was performed of the pelvis to demonstrate the iliac marrow spaces as well as the left-sided retroperitoneal nodal conglomeration with dominant component measuring approximately 3.4 x 4.2  cm (image 36, series 2). Both operative sites were prepped and draped in the usual sterile fashion. Under sterile conditions and local anesthesia, a 22 gauge spinal needle was utilized for procedural planning. Next, an 11 gauge coaxial bone biopsy needle was advanced into the right iliac marrow space. Needle position was confirmed with CT imaging. Initially, bone marrow aspiration was performed. Next, a bone marrow biopsy was obtained with the 11 gauge outer bone marrow device. Samples were prepared with the cytotechnologist and deemed adequate. The needle was removed intact. Attention was now paid towards the retroperitoneal lymph node biopsy. A 22 gauge needle was utilized for procedural planning purposes. Next, a 40 gauge coaxial needle was advanced into the posterior peripheral aspect the left-sided retroperitoneal nodal conglomeration. Appropriate position was confirmed and 4 core needle biopsies were obtained with a 18 gauge biopsy device. Samples were placed on Telfa and submitted to pathology for analysis. The coaxial needle was removed and superficial hemostasis was achieved with manual compression. Dressings were placed. The patient tolerated procedure well without immediate postprocedural complication. IMPRESSION: 1. Technically successful CT guided right iliac bone marrow aspiration and core biopsy. 2. Technically successful CT-guided left-sided retroperitoneal nodal conglomeration core needle biopsy. Electronically Signed   By: Sandi Mariscal M.D.   On: 01/21/2016 11:24    Assessment:  Hayden Martin is a 82 y.o. 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    PET scan on 01/16/2016 revealed extensive hypermetabolic adenopathy within the neck, chest, abdomen and pelvis.  The spleen was enlarged with  multi focal hypermetabolic splenic lesions  There was evidence of trans peritoneal spread of tumor within the upper abdomen.   There were bilateral pleural effusions.  He has a history of left lower extremity thrombosis (2014). Bilateral lower extremity duplex on 01/16/2016 revealed DVT in the left lower extremity involving the calf veins and the left popliteal vein.  Clot burden greater than in 2014 suggesting acute thrombus.  He received Eliquis on 01/16/2016.  He is on heparin.  IVC filter was placed on 01/18/2016.  He has acute on chronic renal insufficiency secondary to tumor lysis.  Baseline creatinine is 1.3 - 1.4.  Creatinine was 2.34 on admission with a uric acid of  12.7.  Creatinine has improved to 1.56.  Uric acid has improved to 5.9 (normal).  He has an enlarged prostate and an elevated PSA.  Symptomatically, he feels a little tired today after all of his procedures.  Plan:   1.  Oncology:  Patient likely has stage IV lymphoma.  Retroperitoneal lymph node and bone marrow biopsy performed today (biopsy delayed secondary to Eliquis taken on 01/16/2016).  Pathology expediting evaluation in order to treat as inpatient on 11/21 or 11/22 (prior to Thanksgiving holiday).  Anticipate flow cytometry results tomorrow.    Hepatitis serologies negative (in anticipation of Rituxan).  Echo on 01/18/2016 revealed an EF of 55-60%.  Port-a-cath placed today.  2.  Hematology:  Patient has an acute thrombus in left lower extremity.  Eliquis switched to heparin to allow infusion to be held prior to upcoming biopsies on 01/21/2016.  IVC filter placed on 01/18/2016.  Continue heparin.   Thrombocytopenia on admission (platelet count 89,000) felt secondary to splenomegaly and possibly marrow disease.  Bone marrow biopsy on 01/21/2016.  Follow platelet count daily.  Discontinue heparin if platelets < 50,000.  3.  Nephrology:  Tumor lysis syndrome on IVF, alkalinization, and allopurinol.   Creatinine was 2.34 on admission with a uric acid of  12.7.  Creatinine has improved to 1.56.  Uric acid has improved to 5.9 (normal).  Check  BMP, uric acid, phosphorus daily.  G6PD testing negative (ok to give rasburicase if needed).  Appreciate nephrology consult.  4.  Disposition:  Patient will remain hospitalized until treatment (and a few days after) to manage tumor lysis.  Anticipate initiation of treatment as soon as pathology available.  Once stability post treatment documented, patient can be discharged.  5.  Code status:  Discussed with patient and family.  Full code.   Lequita Asal, MD  01/21/2016

## 2016-01-21 NOTE — Progress Notes (Signed)
Wrong encounter. 

## 2016-01-21 NOTE — Progress Notes (Signed)
[facility] NAME: Hayden Martin    MR#:  [id number]  DATE OF BIRTH:  07-Mar-1931  SUBJECTIVE:   Pt. Is s/p CT guided bone marrow biopsy and also Port-a-cath placement today. Seen this afternoon.  Family at bedside.  No pain presently.    REVIEW OF SYSTEMS:    Review of Systems  Constitutional: Negative for chills and fever.  HENT: Negative for congestion and tinnitus.   Eyes: Negative for blurred vision and double vision.  Respiratory: Negative for cough, shortness of breath and wheezing.   Cardiovascular: Negative for chest pain, orthopnea and PND.  Gastrointestinal: Negative for abdominal pain, diarrhea, nausea and vomiting.  Genitourinary: Negative for dysuria and hematuria.  Neurological: Negative for dizziness, sensory change and focal weakness.  All other systems reviewed and are negative.   Nutrition: Carb control Tolerating Diet: Yes Tolerating PT: Await Eval.    DRUG ALLERGIES:   Allergies  Allergen Reactions  . Codeine Itching  . Penicillins Itching    VITALS:  Blood pressure (!) 119/54, pulse 83, temperature 97.5 F (36.4 C), temperature source Oral, resp. rate 20, height 5' 11"  (1.803 m), weight 92.3 kg (203 lb 8 oz), SpO2 94 %.  PHYSICAL EXAMINATION:   Physical Exam  GENERAL:  82 y.o.-year-old patient lying in the bed with no acute distress.  EYES: Pupils equal, round, reactive to light and accommodation. No scleral icterus. Extraocular muscles intact.  HEENT: Head atraumatic, normocephalic. Oropharynx and nasopharynx clear.  NECK:  Supple, no jugular venous distention. No thyroid enlargement, no tenderness.  LUNGS: Normal breath sounds bilaterally, no wheezing, rales, rhonchi. No use of accessory muscles of respiration.  CARDIOVASCULAR: S1, S2 normal. No murmurs, rubs, or gallops.  ABDOMEN: Soft, nontender, nondistended. Bowel sounds present. No organomegaly or mass.  EXTREMITIES: No cyanosis,  clubbing or edema b/l.    NEUROLOGIC: Cranial nerves II through XII are intact. No focal Motor or sensory deficits b/l. Globally weak  PSYCHIATRIC: The patient is alert and oriented x 3.  SKIN: No obvious rash, lesion, or ulcer.    LABORATORY PANEL:   CBC  Recent Labs Lab 01/21/16 0447  WBC 3.2*  HGB 12.4*  HCT 37.2*  PLT 84*   ------------------------------------------------------------------------------------------------------------------  Chemistries   Recent Labs Lab 01/14/16 1619  01/18/16 0346  01/20/16 0453 01/21/16 0447  NA 133*  < > 135  < > 133* 134*  K 5.5*  < > 4.5  < > 5.0  --   CL 102  < > 108  < > 106  --   CO2 22  < > 19*  < > 18* 20*  GLUCOSE 198*  < > 69  < > 122*  --   BUN 46*  < > 22*  < > 18  --   CREATININE 2.34*  < > 1.60*  1.55*  < > 1.60* 1.56*  CALCIUM 8.9  < > 7.7*  < > 8.1*  --   MG  --   < > 1.8  --   --   --   AST 37  --   --   --   --   --   ALT 29  --   --   --   --   --   ALKPHOS 47  --   --   --   --   --   BILITOT 0.9  --   --   --   --   --   < > =  values in this interval not displayed. ------------------------------------------------------------------------------------------------------------------  Cardiac Enzymes No results for input(s): TROPONINI in the last 168 hours. ------------------------------------------------------------------------------------------------------------------  RADIOLOGY:  Ct Biopsy  Result Date: 01/21/2016 INDICATION: Concern for lymphoma. Please perform CT-guided retroperitoneal lymph known biopsy and CT-guided bone marrow biopsy for tissue diagnostic purposes. EXAM: 1. CT-GUIDED BONE MARROW BIOPSY AND ASPIRATION 2. CT-GUIDED LEFT-SIDED RETROPERITONEAL MASS BIOPSY. COMPARISON:  PET-CT - 01/16/2016 MEDICATIONS: None ANESTHESIA/SEDATION: Fentanyl 50 mcg IV; Versed 1.5 mg IV Sedation Time: 30 minutes; The patient was continuously monitored during the procedure by the interventional radiology nurse under  my direct supervision. COMPLICATIONS: None immediate. PROCEDURE: Informed consent was obtained from the patient following an explanation of the procedure, risks, benefits and alternatives. The patient understands, agrees and consents for the procedure. All questions were addressed. A time out was performed prior to the initiation of the procedure. The patient was positioned prone and non-contrast localization CT was performed of the pelvis to demonstrate the iliac marrow spaces as well as the left-sided retroperitoneal nodal conglomeration with dominant component measuring approximately 3.4 x 4.2 cm (image 36, series 2). Both operative sites were prepped and draped in the usual sterile fashion. Under sterile conditions and local anesthesia, a 22 gauge spinal needle was utilized for procedural planning. Next, an 11 gauge coaxial bone biopsy needle was advanced into the right iliac marrow space. Needle position was confirmed with CT imaging. Initially, bone marrow aspiration was performed. Next, a bone marrow biopsy was obtained with the 11 gauge outer bone marrow device. Samples were prepared with the cytotechnologist and deemed adequate. The needle was removed intact. Attention was now paid towards the retroperitoneal lymph node biopsy. A 22 gauge needle was utilized for procedural planning purposes. Next, a 48 gauge coaxial needle was advanced into the posterior peripheral aspect the left-sided retroperitoneal nodal conglomeration. Appropriate position was confirmed and 4 core needle biopsies were obtained with a 18 gauge biopsy device. Samples were placed on Telfa and submitted to pathology for analysis. The coaxial needle was removed and superficial hemostasis was achieved with manual compression. Dressings were placed. The patient tolerated procedure well without immediate postprocedural complication. IMPRESSION: 1. Technically successful CT guided right iliac bone marrow aspiration and core biopsy. 2.  Technically successful CT-guided left-sided retroperitoneal nodal conglomeration core needle biopsy. Electronically Signed   By: Sandi Mariscal M.D.   On: 01/21/2016 11:24   Ct Biopsy  Result Date: 01/21/2016 INDICATION: Concern for lymphoma. Please perform CT-guided retroperitoneal lymph known biopsy and CT-guided bone marrow biopsy for tissue diagnostic purposes. EXAM: 1. CT-GUIDED BONE MARROW BIOPSY AND ASPIRATION 2. CT-GUIDED LEFT-SIDED RETROPERITONEAL MASS BIOPSY. COMPARISON:  PET-CT - 01/16/2016 MEDICATIONS: None ANESTHESIA/SEDATION: Fentanyl 50 mcg IV; Versed 1.5 mg IV Sedation Time: 30 minutes; The patient was continuously monitored during the procedure by the interventional radiology nurse under my direct supervision. COMPLICATIONS: None immediate. PROCEDURE: Informed consent was obtained from the patient following an explanation of the procedure, risks, benefits and alternatives. The patient understands, agrees and consents for the procedure. All questions were addressed. A time out was performed prior to the initiation of the procedure. The patient was positioned prone and non-contrast localization CT was performed of the pelvis to demonstrate the iliac marrow spaces as well as the left-sided retroperitoneal nodal conglomeration with dominant component measuring approximately 3.4 x 4.2 cm (image 36, series 2). Both operative sites were prepped and draped in the usual sterile fashion. Under sterile conditions and local anesthesia, a 22 gauge spinal needle was utilized for procedural planning.  Next, an 11 gauge coaxial bone biopsy needle was advanced into the right iliac marrow space. Needle position was confirmed with CT imaging. Initially, bone marrow aspiration was performed. Next, a bone marrow biopsy was obtained with the 11 gauge outer bone marrow device. Samples were prepared with the cytotechnologist and deemed adequate. The needle was removed intact. Attention was now paid towards the  retroperitoneal lymph node biopsy. A 22 gauge needle was utilized for procedural planning purposes. Next, a 26 gauge coaxial needle was advanced into the posterior peripheral aspect the left-sided retroperitoneal nodal conglomeration. Appropriate position was confirmed and 4 core needle biopsies were obtained with a 18 gauge biopsy device. Samples were placed on Telfa and submitted to pathology for analysis. The coaxial needle was removed and superficial hemostasis was achieved with manual compression. Dressings were placed. The patient tolerated procedure well without immediate postprocedural complication. IMPRESSION: 1. Technically successful CT guided right iliac bone marrow aspiration and core biopsy. 2. Technically successful CT-guided left-sided retroperitoneal nodal conglomeration core needle biopsy. Electronically Signed   By: Sandi Mariscal M.D.   On: 01/21/2016 11:24     ASSESSMENT AND PLAN:   82 year old male with past medical history of diabetes, hypertension, previous history of DVT, suspected lymphoma, admitted to the hospital due to acute renal failure secondary to hyperuricemia and tumor lysis syndrome.  1. Acute on chronic renal failure-secondary to tumor lysis syndrome. Creatinine has improved with IV fluids and close to baseline now. - appreciate Nephro input and improved w/ alkalization of urine w/ Bicarb.   2. Metabolic Acidosis - due to ARF and improved w/ Bicarb and as renal function has improved.   3. Hyperkalemia - due to AKI and improved w/ IV fluids and will monitoir.   4. Hyperuricemia - due to Tumor lysis syndrome from suspected Lymphoma.  - improved w/ allopurinol and IV fluids and will monitor.   5. Suspected Lymphoma - PET Scan as outpatient + for lymph nodes in the neck chest abdomen pelvis and also splenomegaly. Patient is status post bone marrow biopsy and Port-A-Cath placement today. -Plan is for initiating treatment while in the hospital. Await further oncology  input.  6. History of DVT-patient is status post IVC filter placement. -Continue heparin drip for now. Follow platelet count and if remains stable considering switching back to Eliquis.  7. Anemia/thrombocytopenia-secondary to the suspected lymphoma. Follow serial counts. Transfuse as needed.  8. Diabetes type 2 without complication-continue sliding scale insulin, Lantus.  9. HTN - cont. Coreg.     All the records are reviewed and case discussed with Care Management/Social Worker. Management plans discussed with the patient, family and they are in agreement.  CODE STATUS: Full code  DVT Prophylaxis: Heparin gtt  TOTAL TIME TAKING CARE OF THIS PATIENT: 35 minutes.   POSSIBLE D/C IN 2-3 DAYS, DEPENDING ON CLINICAL CONDITION.   Henreitta Leber M.D on 01/21/2016 at 3:38 PM  Between 7am to 6pm - Pager - [phone]  After 6pm go to [email or web address] - Proofreader  [facility]  Office  [phone]  CC: Primary care physician; Kirk Ruths., MD

## 2016-01-21 NOTE — Consult Note (Signed)
Chief Complaint: Concern for lymphoma  Referring Physician(s): Finnegan  Patient Status: [place] - In-pt  History of Present Illness: Hayden Martin is a 82 y.o. 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 Patient is currently admitted to the hospital with concern for tumor lysis syndrome and associated renal insufficiency. Request made for CT-guided retroperitoneal lymph node and bone marrow biopsy for tissue diagnostic purposes. The patient serves as his own historian.  Patient's admission also complicated by development of a lower extremity DVT for which the patient underwent IVC filter placement on 01/18/2016. Patient's last dose of Eliquis was on 11/15 and has since been maintained on a heparin drip.  The patient reports mild baseline shortness of breath but is otherwise without complaint. No fever or chills. No chest pain. No unintentional weight loss or weight gain.  Past Medical History:  Diagnosis Date  . Clotting disorder ([place])   . Diabetes mellitus without complication ([place])   . Heart disease   . Hypertension     Past Surgical History:  Procedure Laterality Date  . HERNIA REPAIR    . PERIPHERAL VASCULAR CATHETERIZATION N/A 01/18/2016   Procedure: IVC Filter Insertion;  Surgeon: Katha Cabal, MD;  Location: [facility];  Service: Cardiovascular;  Laterality: N/A;    Allergies: Codeine and Penicillins  Medications: Prior to Admission medications   Medication Sig Start Date End Date Taking? Authorizing Provider  rivaroxaban (XARELTO) 20 MG TABS tablet Take by mouth.   Yes Historical Provider, MD  aspirin (GOODSENSE ASPIRIN) 325 MG tablet Take by mouth.    Historical Provider, MD  atorvastatin (LIPITOR) 20 MG tablet Take 20 mg by mouth daily.     Historical Provider, MD  carvedilol (COREG) 6.25 MG tablet TAKE ONE (1) TABLET BY  MOUTH TWO (2) TIMES DAILY 02/05/15   Historical Provider, MD  insulin aspart (NOVOLOG) 100 UNIT/ML FlexPen Inject 8 Units into the skin 3 (three) times daily with meals.     Historical Provider, MD  Insulin Glargine (LANTUS [place]) Inject into the skin. Inject 35 units subcutaneously at bedtime.    Historical Provider, MD  ketoconazole (NIZORAL) 2 % cream APPLY AT BEDTIME DAILY TO AFFECTED AREAS 12/27/14   Historical Provider, MD  lisinopril-hydrochlorothiazide (PRINZIDE,ZESTORETIC) 20-25 MG tablet TAKE ONE (1) TABLET BY MOUTH EVERY DAY 08/20/15   Historical Provider, MD     History reviewed. No pertinent family history.  Social History   Social History  . Marital status: Widowed    Spouse name: N/A  . Number of children: N/A  . Years of education: N/A   Social History Main Topics  . Smoking status: Former Smoker    Packs/day: 1.50    Quit date: 01/01/1986  . Smokeless tobacco: Former Systems developer     Comment: Smoked for 20 years  . Alcohol use None  . Drug use: Unknown  . Sexual activity: Not Asked   Other Topics Concern  . None   Social History Narrative  . None    ECOG Status: 1 - Symptomatic but completely ambulatory  Review of Systems: A 12 point ROS discussed and pertinent positives are indicated in the HPI above.  All other systems are negative.  Review of Systems  Constitutional: Negative.   Respiratory: Positive for shortness of breath.        Patient reports mild baseline short of breath.  Cardiovascular: Negative.  Gastrointestinal: Negative.     Vital Signs: BP 133/68   Pulse 89   Temp 98.3 F (36.8 C) (Oral)   Resp (!) 24   Ht _0  (1.803 m)   Wt 203 lb 8 oz (92.3 kg)   SpO2 92%   BMI 28.38 kg/m   Physical Exam  Constitutional: He appears well-developed and well-nourished.  HENT:  Head: Normocephalic and atraumatic.  Cardiovascular: Normal rate and regular rhythm.   Pulmonary/Chest: Effort normal and breath sounds normal.    Mallampati Score:  MD  Evaluation Airway: WNL Heart: WNL Abdomen: WNL Chest/ Lungs: WNL ASA  Classification: 1, 2 Mallampati/Airway Score: One  Imaging: Ct Abdomen Pelvis W Contrast  Result Date: 01/08/2016 CLINICAL DATA:  Diffuse abdominal pain over the last 2 weeks. Unable to eat. EXAM: CT ABDOMEN AND PELVIS WITH CONTRAST TECHNIQUE: Multidetector CT imaging of the abdomen and pelvis was performed using the standard protocol following bolus administration of intravenous contrast. CONTRAST:  44m ISOVUE-300 IOPAMIDOL (ISOVUE-300) INJECTION 61% COMPARISON:  None. FINDINGS: Lower chest: There is a left pleural effusion layering dependently. Pleural irregularity on the left could relate to pleural metastatic disease. No pulmonary parenchymal lesion is seen in the lower chest other than volume loss in the left lower lobe. Hepatobiliary: No liver mass is identified. No calcified gallstones. There are markedly enlarged lymph nodes in the porta hepatis. Pancreas: No primary pancreatic lesion is seen. There is a duodenum diverticulum in the region the pancreatic head. There are numerous. Pancreatic lymph nodes. Spleen: The spleen is enlarged and contains multiple large low-density areas consistent with splenic metastatic disease or primary lymphoma. Adrenals/Urinary Tract: Adrenal glands are normal. Kidneys are normal. No cyst, mass, stone or hydronephrosis. Excretion appears delayed. The bladder appears thick walled. The prostate gland is markedly enlarged, up to a cm in diameter. Stomach/Bowel: No acute or primary bowel pathology is seen. Vascular/Lymphatic: As noted above, there is massive lymphadenopathy throughout the abdominal retroperitoneum. Large confluent lymph nodes are present surrounding the aorta and IVC, with any retrocrural region, in the portal region, surrounding the pancreas, scattered within the proximal mesenteries and at the gastroesophageal junction. Index nodes include the following. Retrocrural node on image 10  measuring 2.1 x 2.0 cm. Periportal node on image 32 measuring 2.6 x 5.8 cm. Confluent nodal mass is in the retroperitoneum surrounding the great vessels measuring 5.6 x 3.8 cm on the left on image 41 and 5.3 x 2.9 cm on the right on image 41. Notably, left iliac node image 73 measures 1.7 x 2.6 cm. There is atherosclerosis of the aorta but no aneurysm. Reproductive: Markedly enlarged prostate, 8 cm in diameter as noted above. Other: Small amount of freely distributed ascites. Small periumbilical hernia containing only fat. Musculoskeletal: No definable sclerotic or lytic bone lesions. IMPRESSION: This patient has either a new presentation of lymphoma or advanced metastatic carcinoma. There are multiple solid masses within the spleen. There is extensive retroperitoneal, portal and mesenteric lymphadenopathy. There is possibly pleural disease on the left. In addition to lymphoma, prostate cancer is possible given that the prostate gland is markedly enlarged. However, most of the lymphadenopathy is abdominal, with only a single enlarged left iliac chain node. Notable absence of definable hepatic metastatic disease. Aortic atherosclerosis. Periumbilical hernia containing only fat. Electronically Signed   By: MNelson ChimesM.D.   On: 01/08/2016 06:44   [place]al  Result Date: 01/15/2016 CLINICAL DATA:  Acute renal failure syndrome, chronic renal disease stage III EXAM: RENAL / URINARY  TRACT ULTRASOUND COMPLETE COMPARISON:  CT from 01/07/2016 FINDINGS: Right Kidney: Length: 11.1 cm. Echogenicity within normal limits. No mass or hydronephrosis visualized. Left Kidney: Length: 11.7 cm. Echogenicity within normal limits. No mass or hydronephrosis visualized. Bladder: The bladder is contracted which may account the mild thickened appearance of the bladder wall up to 5 mm. No calculus or focal mass. Incidental note is made of postop megaly with the prostate measuring approximately 9.1 x 7.7 x 7.3 cm in craniocaudad by AP by  transverse dimension. Heterogeneous ill-defined masses of the spleen mass seen on CT. The spleen measures approximately 15 x 8.4 cm. IMPRESSION: Kidneys and bladder are without acute appearing abnormality. Heterogeneous solid masslike abnormalities of the large spleen as noted on recent CT may reflect lymphoma/leukemia. There is prostatomegaly as well. Electronically Signed   By: Ashley Royalty M.D.   On: 01/15/2016 17:45   Nm Pet Image Initial (pi) Skull Base To Thigh  Result Date: 01/16/2016 CLINICAL DATA:  Initial treatment strategy for lymphoma. EXAM: NUCLEAR MEDICINE PET SKULL BASE TO THIGH TECHNIQUE: 12.0 mCi F-18 FDG was injected intravenously. Full-ring PET imaging was performed from the skull base to thigh after the radiotracer. CT data was obtained and used for attenuation correction and anatomic localization. FASTING BLOOD GLUCOSE:  Value: 122 mg/dl COMPARISON:  None. FINDINGS: NECK Left posterior cervical nodes are identified, appear enlarged and hypermetabolic. Index node measures 1.2 cm and has an SUV max equal to 8.2, image 52 of series 3. Hypermetabolic left supraclavicular lymph node is identified. This measures 1.2 cm and has an SUV max equal to 10.68. CHEST Mild increased FDG uptake within the left hilar region has an SUV max equal to 4.4. Within the posterior mediastinum there is an enlarged lymph node between the esophagus and aorta. This measures 1.6 cm and has an SUV max equal to 15.2, image 100 of series 3. Enlarged and hypermetabolic left cardio phrenic angle lymph node measures 1.3 cm and has an SUV max equal to 9.2, image 124 of series 3. Bilateral pleural effusions are identified left-greater-than-right. No hypermetabolic pulmonary nodule or mass identified. ABDOMEN/PELVIS There is extensive hypermetabolic adenopathy within the upper abdomen. Periaortic nodal mass measures 3.4 cm and has an SUV max equal to 14.6, image 174 of series 3. Hypermetabolic enlarged retrocaval node measures  2.1 cm and has an SUV max equal to 15.0. Extensive hypermetabolic adenopathy within the mesenteric noted. Index jejunal node measures 2 cm and has an SUV max equal to 12.95, image 169 of series 3. Enlarged and hypermetabolic left external iliac lymph node measures 1.5 cm and has an SUV max equal to 15.78, image 232 of series 3. Extensive hypermetabolic tumor is identified throughout the spleen. The spleen measures 13 cm in length. Hypermetabolic lesion within the spleen measures 10.5 cm and has an SUV max equal to 13.02. There is evidence of trans peritoneal spread of hypermetabolic tumor within the abdomen. For example, within the left lower quadrant of the abdomen there is soft tissue stranding, fluid and nodularity along the peritoneal reflection. SUV max equals 5.07, image 219 of series 3. Ascites is noted extending over the right lobe of liver. SKELETON No focal hypermetabolic activity to suggest skeletal metastasis. IMPRESSION: 1. Examination is positive for extensive hypermetabolic adenopathy within the neck, chest, abdomen and pelvis compatible with the clinical history of lymphoma. 2. The spleen is enlarged and there is multi focal hypermetabolic splenic lesions. 3. Evidence of trans peritoneal spread of tumor within the upper abdomen. 4. Bilateral  pleural effusions. 5. Aortic atherosclerosis and multi vessel coronary artery calcification. Electronically Signed   By: Kerby Moors M.D.   On: 01/16/2016 14:23   US Venous Img Lower Bilateral  Result Date: 01/16/2016 CLINICAL DATA:  Leg swelling for 2 weeks. History of left popliteal DVT in 2014. EXAM: BILATERAL LOWER EXTREMITY VENOUS DOPPLER ULTRASOUND TECHNIQUE: Gray-scale sonography with graded compression, as well as color Doppler and duplex ultrasound were performed to evaluate the lower extremity deep venous systems from the level of the common femoral vein and including the common femoral, femoral, profunda femoral, popliteal and calf veins  including the posterior tibial, peroneal and gastrocnemius veins when visible. The superficial great saphenous vein was also interrogated. Spectral Doppler was utilized to evaluate flow at rest and with distal augmentation maneuvers in the common femoral, femoral and popliteal veins. COMPARISON:  12/15/2012 FINDINGS: RIGHT LOWER EXTREMITY Normal compressibility, augmentation and color Doppler flow in the right common femoral vein, right femoral vein and right popliteal vein. The right saphenofemoral junction is patent. Right profunda femoral vein is patent without thrombus. Visualized right deep calf veins are patent without thrombus. LEFT LOWER EXTREMITY Normal compressibility and color Doppler flow in the left common femoral vein. The left saphenofemoral junction is patent. Left profunda femoral vein is patent without thrombus. Normal compression and color Doppler flow in the left femoral vein. Partial compressibility of the proximal left popliteal vein with nonocclusive thrombus. The distal left popliteal vein is not compressible with occlusive thrombus. There appears to be thrombus within the left peroneal and posterior tibial veins. IMPRESSION: Positive for deep venous thrombosis in the left lower extremity. There is thrombus involving the left calf veins and the left popliteal vein. The clot burden in the left popliteal vein is greater than it was in 2014 suggesting that this is acute thrombus rather than just chronic thrombus. Negative for deep venous thrombosis in right lower extremity. Electronically Signed   By: Markus Daft M.D.   On: 01/16/2016 07:49    Labs:  CBC:  Recent Labs  01/18/16 1320 01/19/16 0318 01/20/16 0453 01/21/16 0447  WBC 3.8 3.0* 3.8 3.2*  HGB 13.3 12.1* 13.7 12.4*  HCT 41.7 37.3* 41.4 37.2*  PLT 102* 81* 106* 84*    COAGS:  Recent Labs  01/17/16 1030  01/18/16 0346 01/18/16 1320 01/19/16 0318 01/21/16 0447  INR 1.66  --   --   --   --  1.37  APTT 34  < >  >160* 96* 85* 34  < > = values in this interval not displayed.  BMP:  Recent Labs  01/17/16 0449 01/18/16 0346 01/19/16 0318 01/20/16 0453 01/21/16 0447  NA 136 135 134* 133* 134*  K 4.5 4.5 4.6 5.0  --   CL 110 108 110 106  --   CO2 20* 19* 20* 18* 20*  GLUCOSE 110* 69 104* 122*  --   BUN 27* 22* 19 18  --   CALCIUM 7.8* 7.7* 7.6* 8.1*  --   CREATININE 1.68* 1.60*  1.55* 1.59* 1.60* 1.56*  GFRNONAA 36* 38*  40* 39* 38* 40*  GFRAA 42* 45*  46* 45* 45* 46*    LIVER FUNCTION TESTS:  Recent Labs  01/14/16 1619  BILITOT 0.9  AST 37  ALT 29  ALKPHOS 47  PROT 6.7  ALBUMIN 3.5    TUMOR MARKERS: No results for input(s): AFPTM, CEA, CA199, CHROMGRNA in the last 8760 hours.  Assessment and Plan:  Hayden Martin is a 82  y.o. 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 Patient is currently admitted to the hospital with concern for tumor lysis syndrome and associated renal insufficiency.  Patient is not taking his Eliquis medicine since 11/15.  Risks and Benefits of CT-guided bone marrow retroperitoneal lymph node biopsy were discussed with the patient including, but not limited to bleeding, infection, damage to adjacent structures or low yield requiring additional tests.  All of the patient's questions were answered, patient is agreeable to proceed.  Consent signed and in chart.  Thank you for this interesting consult.  I greatly enjoyed meeting Hayden Martin and look forward to participating in their care.  A copy of this report was sent to the requesting provider on this date.  Electronically Signed: Sandi Mariscal 01/21/2016, 9:45 AM   I spent a total of 20 Minutes in face to face in clinical consultation, greater than 50% of which was counseling/coordinating care for CT guided bone marrow and retroperitoneal lymph node  biopsy

## 2016-01-21 NOTE — Progress Notes (Signed)
ANTICOAGULATION CONSULT NOTE - Initial Consult  Pharmacy Consult for heparin drip Indication: DVT  Allergies  Allergen Reactions  . Codeine Itching  . Penicillins Itching    Patient Measurements: Height: 5\' 11"  (180.3 cm) Weight: 203 lb 8 oz (92.3 kg) IBW/kg (Calculated) : 75.3 Heparin Dosing Weight: 92 kg  Vital Signs: Temp: 97.5 F (36.4 C) (11/20 1503) Temp Source: Oral (11/20 1503) BP: 119/54 (11/20 1503) Pulse Rate: 83 (11/20 1503)  Labs:  Recent Labs  01/19/16 0318 01/20/16 0453 01/21/16 0447  HGB 12.1* 13.7 12.4*  HCT 37.3* 41.4 37.2*  PLT 81* 106* 84*  APTT 85*  --  34  LABPROT  --   --  17.0*  INR  --   --  1.37  HEPARINUNFRC 0.58 0.48 <0.10*  CREATININE 1.59* 1.60* 1.56*    Estimated Creatinine Clearance: 42.4 mL/min (by C-G formula based on SCr of 1.56 mg/dL (H)).   Medical History: Past Medical History:  Diagnosis Date  . Clotting disorder ([place])   . Diabetes mellitus without complication ([place])   . Heart disease   . Hypertension    Medications:  Scheduled:  . allopurinol  300 mg Oral Daily  . carvedilol  6.25 mg Oral BID WC  . dronabinol  2.5 mg Oral BID AC  . fentaNYL      . gentamicin irrigation   Irrigation Once  . heparin lock flush      . insulin aspart  0-9 Units Subcutaneous TID WC  . insulin glargine  5 Units Subcutaneous QHS  . ketoconazole   Topical Daily  . midazolam      . pneumococcal 23 valent vaccine  0.5 mL Intramuscular Tomorrow-1000  . sodium bicarbonate  650 mg Oral BID   Assessment: Pharmacy consulted to dose and monitor heparin drip in this 82 year old male admitted for tumor lysis syndrome and diagnosed with an acute lower extremity DVT.   Patient was taking rivaroxaban prior to admission for a history of DVT and was converted to apixaban on admission due to ARF and CrCl < 30 mL/min. Patient has received 2 doses of apixaban during admission - last dose at 2206 on 11/15.   Baseline labs (CBC, INR, APTT, and  heparin level) were obtained   Goal of Therapy:  Heparin level 0.3-0.7 units/ml aPTT 66 - 102 seconds Monitor platelets by anticoagulation protocol: Yes   Plan:  Patient now S/P CT guided bone marrow aspiration and biopsy of right iliac crest and S/P Port-A-Cath placement right internal jugular vein.   MD wants to restart heparin drip while patient in patient. Baseline ok High risk of bleeding and but need for therapeutic anticoagulation, will restart heparin drip in 12 hours.  Drip ordered to start at heparin 1100units/hr at 0200 on 11/21. Will order heparin level for 8 hours after initiation.    Pernell Dupre, PharmD  Clinical Pharmacist 01/21/2016,3:13 PM

## 2016-01-21 NOTE — Care Management Important Message (Signed)
Important Message  Patient Details  Name: DAMARIOUS GOODGION MRN: [id number] Date of Birth: 09/08/1931   Medicare Important Message Given:  Yes    Shelbie Ammons, RN 01/21/2016, 11:14 AM

## 2016-01-21 NOTE — Op Note (Signed)
      [facility]       Operative Note  Date: 01/15/2016 - 01/21/2016  Preoperative diagnosis:  1. Lymphoma  Postoperative diagnosis:  Same as above  Procedures: #1. Ultrasound guidance for vascular access to the right internal jugular vein. #2. Fluoroscopic guidance for placement of catheter. #3. Placement of CT compatible Port-A-Cath, right internal jugular vein.  Surgeon: Leotis Pain, MD.   Anesthesia: Local with moderate conscious sedation for approximately 15  minutes using 2 mg of Versed and 50 mcg of Fentanyl  Fluoroscopy time: less than 1 minute  Contrast used: 0  Estimated blood loss: 25 cc  Indication for the procedure:  The patient is a 82 y.o.male with lymphoma and tumor lysis syndrome.  The patient needs a Port-A-Cath for durable venous access, chemotherapy, lab draws, and CT scans. We are asked to place this. Risks and benefits were discussed and informed consent was obtained.  Description of procedure: The patient was brought to the vascular and interventional radiology suite.  Moderate conscious sedation was administered throughout the procedure during a face to face encounter with the patient with my supervision of the RN administering medicines and monitoring the patient's vital signs, pulse oximetry, telemetry and mental status throughout from the start of the procedure until the patient was taken to the recovery room. The right neck chest and shoulder were sterilely prepped and draped, and a sterile surgical field was created. Ultrasound was used to help visualize a patent right internal jugular vein. This was then accessed under direct ultrasound guidance without difficulty with the Seldinger needle and a permanent image was recorded. A J-wire was placed. After skin nick and dilatation, the peel-away sheath was then placed over the wire. I then anesthetized an area under the clavicle approximately 1-2 fingerbreadths. A transverse incision was  created and an inferior pocket was created with electrocautery and blunt dissection. The port was then brought onto the field, placed into the pocket and secured to the chest wall with 2 Prolene sutures. The catheter was connected to the port and tunneled from the subclavicular incision to the access site. Fluoroscopic guidance was then used to cut the catheter to an appropriate length. The catheter was then placed through the peel-away sheath and the peel-away sheath was removed. The catheter tip was parked in excellent location under fluorocoscopic guidance in the SVC just above the right atrium. The pocket was then irrigated with antibiotic impregnated saline and the wound was closed with a running 3-0 Vicryl and a 4-0 Monocryl. The access incision was closed with a single 4-0 Monocryl. The Huber needle was used to withdraw blood and flush the port with heparinized saline. Dermabond was then placed as a dressing. The patient tolerated the procedure well and was taken to the recovery room in stable condition.   Leotis Pain 01/21/2016 2:05 PM   This note was created with Dragon Medical transcription system. Any errors in dictation are purely unintentional.

## 2016-01-21 NOTE — H&P (Signed)
[facility] History & Physical Update  The patient was interviewed and re-examined.  The patient's previous History and Physical has been reviewed and is unchanged.  There is no change in the plan of care. We plan to proceed with the scheduled procedure.  Leotis Pain, MD  01/21/2016, 1:20 PM

## 2016-01-22 ENCOUNTER — Encounter: Payer: Self-pay | Admitting: Vascular Surgery

## 2016-01-22 LAB — GLUCOSE, CAPILLARY
GLUCOSE-CAPILLARY: 103 mg/dL — AB (ref 65–99)
GLUCOSE-CAPILLARY: 119 mg/dL — AB (ref 65–99)
Glucose-Capillary: 129 mg/dL — ABNORMAL HIGH (ref 65–99)
Glucose-Capillary: 107 mg/dL — ABNORMAL HIGH (ref 65–99)

## 2016-01-22 LAB — CBC
HCT: 39.3 % — ABNORMAL LOW (ref 40.0–52.0)
Hemoglobin: 13 g/dL (ref 13.0–18.0)
MCH: 26.4 pg (ref 26.0–34.0)
MCHC: 33.2 g/dL (ref 32.0–36.0)
MCV: 79.5 fL — ABNORMAL LOW (ref 80.0–100.0)
PLATELETS: 93 10*3/uL — AB (ref 150–440)
RBC: 4.94 MIL/uL (ref 4.40–5.90)
RDW: 19.8 % — AB (ref 11.5–14.5)
WBC: 3.6 10*3/uL — AB (ref 3.8–10.6)

## 2016-01-22 LAB — HEPARIN LEVEL (UNFRACTIONATED)
HEPARIN UNFRACTIONATED: 0.22 [IU]/mL — AB (ref 0.30–0.70)
HEPARIN UNFRACTIONATED: 0.43 [IU]/mL (ref 0.30–0.70)

## 2016-01-22 MED ORDER — ENOXAPARIN SODIUM 100 MG/ML ~~LOC~~ SOLN
90.0000 mg | Freq: Two times a day (BID) | SUBCUTANEOUS | Status: DC
  Administered 2016-01-22 – 2016-01-28 (×12): 90 mg via SUBCUTANEOUS
  Filled 2016-01-22 (×14): qty 1

## 2016-01-22 NOTE — Progress Notes (Signed)
Nutrition Follow-up  DOCUMENTATION CODES:   Severe malnutrition in context of chronic illness  INTERVENTION:  1. Orange Sherbert Magic cup TID with meals, each supplement provides 290 kcal and 9 grams of protein 2. D/C [facility], patient does not want 3. Marinol ordered BID per MD  NUTRITION DIAGNOSIS:   Malnutrition related to chronic illness as evidenced by percent weight loss, energy intake < 75% for > or equal to 1 month. -ongoing  GOAL:   Patient will meet greater than or equal to 90% of their needs -progressing  MONITOR:   PO intake, Supplement acceptance, I & O's, Labs, Weight trends  REASON FOR ASSESSMENT:   Malnutrition Screening Tool    ASSESSMENT:   Hayden Martin  is a 82 y.o. male with a known history of  DVT, diabetes, hypertension who is referred from oncology office due to worsening renal failure, elevated LDH level, and elevated uric acid level who was seen by oncology first time in November 13  Patient continues to have poor appetite, poor PO intake Had some grits for breakfast - nothing else. He was not consuming [employer], but was consuming the orange sherbert magic cups.  Overall patient miserable. Documented meal completion: 30% Labs and medications reviewed: Allupurinol  Diet Order:  Diet heart healthy/carb modified Room service appropriate? Yes; Fluid consistency: Thin  Skin:  Reviewed, no issues  Last BM:  01/15/2016  Height:   Ht Readings from Last 1 Encounters:  01/15/16 5\' 11"  (1.803 m)    Weight:   Wt Readings from Last 1 Encounters:  01/15/16 203 lb 8 oz (92.3 kg)    Ideal Body Weight:  78.18 kg  BMI:  Body mass index is 28.38 kg/m.  Estimated Nutritional Needs:   Kcal:  1845-2306 calories  Protein:  120-140 gm  Fluid:  >/= 2L  EDUCATION NEEDS:   No education needs identified at this time  Satira Anis. Beverlyn Mcginness, MS, RD LDN Inpatient Clinical Dietitian Pager [phone]

## 2016-01-22 NOTE — Progress Notes (Signed)
Physical Therapy Treatment Patient Details Name: Hayden Martin MRN: [id number] DOB: 1931-11-05 Today's Date: 01/22/2016    History of Present Illness Hayden Martin  is a 82 y.o. male with a known history of  DVT, diabetes, hypertension who is referred from oncology office due to worsening renal failure, elevated LDH level, and elevated uric acid level who was seen by oncology first time in November 13. Patient previously was noted to have weight loss and abnormal blood work and abdominal CT revealed multiple solid masses within the spleen as well as extensive lymphadenopathy. Patient was planned to have a PET scan and a biopsy of this. Went for follow-up and had repeated blood work which showed he has elevated creatinine and his uric acid level is high and LDH is high as well. Therefore he is admitted for further evaluation and therapy. Patient complains of feeling very weak and tired and has not been able to walk.  Pt found to have L LE DVT 01/16/16 and s/p IVC filter placement 01/18/16.  Pt s/p port-a-cath and CT-guided bone marrow biopsy 01/21/16.    PT Comments    Pt demonstrating decreased activity tolerance this session and requiring pacing and rest breaks d/t fatigue.  Pt still on heparin drip until later tonight per nursing (d/t L LE DVT).  Will continue to progress pt with ambulation distance next session per pt tolerance.  D/t current assist levels and impaired activity tolerance, pt does not appear safe to discharge home alone.  Currently recommending pt discharge to STR pending pt progress (nursing notified).   Follow Up Recommendations  SNF (pending pt progress)     Equipment Recommendations  Rolling walker with 5" wheels    Recommendations for Other Services       Precautions / Restrictions Precautions Precautions: Fall Precaution Comments: IV heparin drip Restrictions Weight Bearing Restrictions: No    Mobility  Bed Mobility Overal bed mobility: Needs  Assistance Bed Mobility: Supine to Sit     Supine to sit: Min assist     General bed mobility comments: assist for trunk; increased effort to perform; HOB elevated  Transfers Overall transfer level: Needs assistance Equipment used: Rolling walker (2 wheeled) Transfers: Sit to/from Stand Sit to Stand: Min assist;Mod assist         General transfer comment: assist to initiate stand and also hold walker from moving (x2 trials standing)  Ambulation/Gait Ambulation/Gait assistance: Min guard Ambulation Distance (Feet): 5 Feet Assistive device: Rolling walker (2 wheeled)   Gait velocity: decreased   General Gait Details: decreased B step length/foot clearance/heelstrike   Stairs            Wheelchair Mobility    Modified Rankin (Stroke Patients Only)       Balance Overall balance assessment: Needs assistance Sitting-balance support: Bilateral upper extremity supported;Feet supported Sitting balance-Leahy Scale: Good     Standing balance support: Bilateral upper extremity supported (on RW) Standing balance-Leahy Scale: Fair Standing balance comment: static standing                    Cognition Arousal/Alertness: Awake/alert Behavior During Therapy: WFL for tasks assessed/performed Overall Cognitive Status: Within Functional Limits for tasks assessed                      Exercises General Exercises - Lower Extremity Ankle Circles/Pumps: AROM;Strengthening;Both;10 reps;Supine Quad Sets: AROM;Strengthening;Both;10 reps;Supine Gluteal Sets: AROM;Strengthening;Both;10 reps;Supine Short Arc Quad: AROM;Strengthening;Both;10 reps;Supine Heel Slides: AROM;Strengthening;Both;10 reps;Supine Hip ABduction/ADduction: AROM;Strengthening;Both;10 reps;Supine  Hip Flexion/Marching: AROM;Strengthening;Both;10 reps;Standing (UE support on RW; CGA )  Pt required vc's for technique.    General Comments General comments (skin integrity, edema, etc.): Pt  resting in bed; daughter present during session.  Nursing cleared pt for participation in physical therapy.  Pt and pt's daughter agreeable to PT session.      Pertinent Vitals/Pain Pain Assessment: No/denies pain  BP 114/54-117/49, HR 83-85 bpm, and O2 >92% on room air during session.    Home Living                      Prior Function            PT Goals (current goals can now be found in the care plan section) Acute Rehab PT Goals Patient Stated Goal: Return to prior level of function at home PT Goal Formulation: With patient/family Time For Goal Achievement: 01/30/16 Potential to Achieve Goals: Fair Progress towards PT goals: Progressing toward goals (with LE strengthening)    Frequency    Min 2X/week      PT Plan Discharge plan needs to be updated    Co-evaluation             End of Session Equipment Utilized During Treatment: Gait belt Activity Tolerance: Patient limited by fatigue Patient left: in chair;with call bell/phone within reach;with chair alarm set;with family/visitor present     Time: 1600-1630 PT Time Calculation (min) (ACUTE ONLY): 30 min  Charges:  $Therapeutic Exercise: 8-22 mins $Therapeutic Activity: 8-22 mins                    G CodesLeitha Martin 2016/01/31, 5:02 PM Hayden Martin, Hayden Martin

## 2016-01-22 NOTE — Progress Notes (Signed)
[facility] Hematology/Oncology Progress Note  Date of admission: 01/15/2016  Hospital day:  01/22/2016   No pathology results available today.  Anticipate flow cytometry tomorrow.  Special stains should also be available tomorrow. Family aware.  Pharmacy notified.  Will need results by early afternoon tomorrow to be able to receive chemotherapy tomorrow.  Dr. Rogue Bussing will be covering for me tomorrow and over holiday weekend.   Lequita Asal, MD  01/22/2016

## 2016-01-22 NOTE — Progress Notes (Signed)
Hayden Martin NAME: Hayden Martin    MR#:  [id number]  DATE OF BIRTH:  Sep 26, 1931  SUBJECTIVE:   Awaiting results of CT guided Bone Marrow biopsy results.  Family at bedside.  No complaints presently.  Plt count stable and no acute bleeding noted.   REVIEW OF SYSTEMS:    Review of Systems  Constitutional: Negative for chills and fever.  HENT: Negative for congestion and tinnitus.   Eyes: Negative for blurred vision and double vision.  Respiratory: Negative for cough, shortness of breath and wheezing.   Cardiovascular: Negative for chest pain, orthopnea and PND.  Gastrointestinal: Negative for abdominal pain, diarrhea, nausea and vomiting.  Genitourinary: Negative for dysuria and hematuria.  Neurological: Negative for dizziness, sensory change and focal weakness.  All other systems reviewed and are negative.   Nutrition: Carb control Tolerating Diet: Yes Tolerating PT: Await Eval.    DRUG ALLERGIES:   Allergies  Allergen Reactions  . Codeine Itching  . Penicillins Itching    VITALS:  Blood pressure (!) 122/46, pulse 87, temperature 98.2 F (36.8 C), resp. rate 16, height 5' 11"  (1.803 m), weight 92.3 kg (203 lb 8 oz), SpO2 91 %.  PHYSICAL EXAMINATION:   Physical Exam  GENERAL:  82 y.o.-year-old patient lying in the bed with no acute distress.  EYES: Pupils equal, round, reactive to light and accommodation. No scleral icterus. Extraocular muscles intact.  HEENT: Head atraumatic, normocephalic. Oropharynx and nasopharynx clear.  NECK:  Supple, no jugular venous distention. No thyroid enlargement, no tenderness.  LUNGS: Normal breath sounds bilaterally, no wheezing, rales, rhonchi. No use of accessory muscles of respiration.  CARDIOVASCULAR: S1, S2 normal. No murmurs, rubs, or gallops.  ABDOMEN: Soft, nontender, nondistended. Bowel sounds present. No organomegaly or mass.  EXTREMITIES: No cyanosis, clubbing, +1-2 edema  on LLE > Right.  NEUROLOGIC: Cranial nerves II through XII are intact. No focal Motor or sensory deficits b/l. Globally weak  PSYCHIATRIC: The patient is alert and oriented x 3.  SKIN: No obvious rash, lesion, or ulcer.    LABORATORY PANEL:   CBC  Recent Labs Lab 01/22/16 1013  WBC 3.6*  HGB 13.0  HCT 39.3*  PLT 93*   ------------------------------------------------------------------------------------------------------------------  Chemistries   Recent Labs Lab 01/18/16 0346  01/20/16 0453 01/21/16 0447  NA 135  < > 133* 134*  K 4.5  < > 5.0  --   CL 108  < > 106  --   CO2 19*  < > 18* 20*  GLUCOSE 69  < > 122*  --   BUN 22*  < > 18  --   CREATININE 1.60*  1.55*  < > 1.60* 1.56*  CALCIUM 7.7*  < > 8.1*  --   MG 1.8  --   --   --   < > = values in this interval not displayed. ------------------------------------------------------------------------------------------------------------------  Cardiac Enzymes No results for input(s): TROPONINI in the last 168 hours. ------------------------------------------------------------------------------------------------------------------  RADIOLOGY:  Ct Biopsy  Result Date: 01/21/2016 INDICATION: Concern for lymphoma. Please perform CT-guided retroperitoneal lymph known biopsy and CT-guided bone marrow biopsy for tissue diagnostic purposes. EXAM: 1. CT-GUIDED BONE MARROW BIOPSY AND ASPIRATION 2. CT-GUIDED LEFT-SIDED RETROPERITONEAL MASS BIOPSY. COMPARISON:  PET-CT - 01/16/2016 MEDICATIONS: None ANESTHESIA/SEDATION: Fentanyl 50 mcg IV; Versed 1.5 mg IV Sedation Time: 30 minutes; The patient was continuously monitored during the procedure by the interventional radiology nurse under my direct supervision. COMPLICATIONS: None immediate. PROCEDURE: Informed consent was  obtained from the patient following an explanation of the procedure, risks, benefits and alternatives. The patient understands, agrees and consents for the procedure. All  questions were addressed. A time out was performed prior to the initiation of the procedure. The patient was positioned prone and non-contrast localization CT was performed of the pelvis to demonstrate the iliac marrow spaces as well as the left-sided retroperitoneal nodal conglomeration with dominant component measuring approximately 3.4 x 4.2 cm (image 36, series 2). Both operative sites were prepped and draped in the usual sterile fashion. Under sterile conditions and local anesthesia, a 22 gauge spinal needle was utilized for procedural planning. Next, an 11 gauge coaxial bone biopsy needle was advanced into the right iliac marrow space. Needle position was confirmed with CT imaging. Initially, bone marrow aspiration was performed. Next, a bone marrow biopsy was obtained with the 11 gauge outer bone marrow device. Samples were prepared with the cytotechnologist and deemed adequate. The needle was removed intact. Attention was now paid towards the retroperitoneal lymph node biopsy. A 22 gauge needle was utilized for procedural planning purposes. Next, a 76 gauge coaxial needle was advanced into the posterior peripheral aspect the left-sided retroperitoneal nodal conglomeration. Appropriate position was confirmed and 4 core needle biopsies were obtained with a 18 gauge biopsy device. Samples were placed on Telfa and submitted to pathology for analysis. The coaxial needle was removed and superficial hemostasis was achieved with manual compression. Dressings were placed. The patient tolerated procedure well without immediate postprocedural complication. IMPRESSION: 1. Technically successful CT guided right iliac bone marrow aspiration and core biopsy. 2. Technically successful CT-guided left-sided retroperitoneal nodal conglomeration core needle biopsy. Electronically Signed   By: Sandi Mariscal M.D.   On: 01/21/2016 11:24   Ct Biopsy  Result Date: 01/21/2016 INDICATION: Concern for lymphoma. Please perform  CT-guided retroperitoneal lymph known biopsy and CT-guided bone marrow biopsy for tissue diagnostic purposes. EXAM: 1. CT-GUIDED BONE MARROW BIOPSY AND ASPIRATION 2. CT-GUIDED LEFT-SIDED RETROPERITONEAL MASS BIOPSY. COMPARISON:  PET-CT - 01/16/2016 MEDICATIONS: None ANESTHESIA/SEDATION: Fentanyl 50 mcg IV; Versed 1.5 mg IV Sedation Time: 30 minutes; The patient was continuously monitored during the procedure by the interventional radiology nurse under my direct supervision. COMPLICATIONS: None immediate. PROCEDURE: Informed consent was obtained from the patient following an explanation of the procedure, risks, benefits and alternatives. The patient understands, agrees and consents for the procedure. All questions were addressed. A time out was performed prior to the initiation of the procedure. The patient was positioned prone and non-contrast localization CT was performed of the pelvis to demonstrate the iliac marrow spaces as well as the left-sided retroperitoneal nodal conglomeration with dominant component measuring approximately 3.4 x 4.2 cm (image 36, series 2). Both operative sites were prepped and draped in the usual sterile fashion. Under sterile conditions and local anesthesia, a 22 gauge spinal needle was utilized for procedural planning. Next, an 11 gauge coaxial bone biopsy needle was advanced into the right iliac marrow space. Needle position was confirmed with CT imaging. Initially, bone marrow aspiration was performed. Next, a bone marrow biopsy was obtained with the 11 gauge outer bone marrow device. Samples were prepared with the cytotechnologist and deemed adequate. The needle was removed intact. Attention was now paid towards the retroperitoneal lymph node biopsy. A 22 gauge needle was utilized for procedural planning purposes. Next, a 57 gauge coaxial needle was advanced into the posterior peripheral aspect the left-sided retroperitoneal nodal conglomeration. Appropriate position was confirmed  and 4 core needle biopsies were obtained  with a 18 gauge biopsy device. Samples were placed on Telfa and submitted to pathology for analysis. The coaxial needle was removed and superficial hemostasis was achieved with manual compression. Dressings were placed. The patient tolerated procedure well without immediate postprocedural complication. IMPRESSION: 1. Technically successful CT guided right iliac bone marrow aspiration and core biopsy. 2. Technically successful CT-guided left-sided retroperitoneal nodal conglomeration core needle biopsy. Electronically Signed   By: Sandi Mariscal M.D.   On: 01/21/2016 11:24     ASSESSMENT AND PLAN:   82 year old male with past medical history of diabetes, hypertension, previous history of DVT, suspected lymphoma, admitted to the hospital due to acute renal failure secondary to hyperuricemia and tumor lysis syndrome.  1. Acute on chronic renal failure-secondary to tumor lysis syndrome. Creatinine has improved with IV fluids and close to baseline now. - appreciate Nephro input and improved w/ alkalization of urine w/ Bicarb.   2. Metabolic Acidosis - due to ARF and improved w/ Bicarb and as renal function has improved.   3. Hyperkalemia - due to AKI and now resolved.   4. Hyperuricemia - due to Tumor lysis syndrome from suspected Lymphoma.  - resolved w/ allopurinol and IV fluids and will monitor.   5. Suspected Lymphoma - PET Scan as outpatient + for lymph nodes in the neck chest abdomen pelvis and also splenomegaly. Patient is status post bone marrow biopsy and Port-A-Cath placement. -Plan is for initiating treatment while in the hospital. Await Path results before initiating therapy as per Oncology.    6. History of DVT-patient is status post IVC filter placement. -Continue heparin drip for now. Will switch to [place] after discussion with Oncology.   7. Anemia/thrombocytopenia-secondary to the suspected lymphoma. Follow serial counts. Transfuse as  needed.  8. Diabetes type 2 without complication-continue sliding scale insulin, Lantus. - BS stable.   9. HTN - cont. Coreg.     All the records are reviewed and case discussed with Care Management/Social Worker. Management plans discussed with the patient, family and they are in agreement.  CODE STATUS: Full code  DVT Prophylaxis: Heparin gtt  TOTAL TIME TAKING CARE OF THIS PATIENT: 25 minutes.   POSSIBLE D/C IN 2-3 DAYS, DEPENDING ON CLINICAL CONDITION.   Henreitta Leber M.D on 01/22/2016 at 1:36 PM  Between 7am to 6pm - Pager - [phone]  After 6pm go to [email or web address] - Proofreader  [facility]  Office  [phone]  CC: Primary care physician; Kirk Ruths., MD

## 2016-01-22 NOTE — Progress Notes (Signed)
ANTICOAGULATION CONSULT NOTE - Initial Consult  Pharmacy Consult for enoxaparin  Indication: DVT  Allergies  Allergen Reactions  . Codeine Itching  . Penicillins Itching    Patient Measurements: Height: 5\' 11"  (180.3 cm) Weight: 203 lb 8 oz (92.3 kg) IBW/kg (Calculated) : 75.3 Heparin Dosing Weight: 92 kg  Vital Signs: Temp: 98.7 F (37.1 C) (11/21 1336) Temp Source: Oral (11/21 1336) BP: 92/46 (11/21 1336) Pulse Rate: 87 (11/21 1336)  Labs:  Recent Labs  01/20/16 0453 01/21/16 0447 01/22/16 1013  HGB 13.7 12.4* 13.0  HCT 41.4 37.2* 39.3*  PLT 106* 84* 93*  APTT  --  34  --   LABPROT  --  17.0*  --   INR  --  1.37  --   HEPARINUNFRC 0.48 <0.10* 0.22*  CREATININE 1.60* 1.56*  --     Estimated Creatinine Clearance: 42.4 mL/min (by C-G formula based on SCr of 1.56 mg/dL (H)).   Medical History: Past Medical History:  Diagnosis Date  . Clotting disorder ([place])   . Diabetes mellitus without complication ([place])   . Heart disease   . Hypertension    Medications:  Scheduled:  . allopurinol  300 mg Oral Daily  . carvedilol  6.25 mg Oral BID WC  . dronabinol  2.5 mg Oral BID AC  . enoxaparin (LOVENOX) injection  90 mg Subcutaneous Q12H  . gentamicin irrigation   Irrigation Once  . insulin aspart  0-9 Units Subcutaneous TID WC  . insulin glargine  5 Units Subcutaneous QHS  . ketoconazole   Topical Daily  . pneumococcal 23 valent vaccine  0.5 mL Intramuscular Tomorrow-1000  . sodium bicarbonate  650 mg Oral BID   Assessment: Pharmacy consulted to dose and monitor heparin drip in this 82 year old male admitted for tumor lysis syndrome and diagnosed with an acute lower extremity DVT.   Patient was taking rivaroxaban prior to admission for a history of DVT and was converted to apixaban on admission due to ARF and CrCl < 30 mL/min. Patient has received 2 doses of apixaban during admission - last dose at 2206 on 11/15.   Treatment team has decided that the patient  should be treated with enoxaparin treatment dose outpatient.    Plan:  Transition patient from IV UFH to treatment dose of SubQ LMWH. Treatment for 5 to 17 days.   Heparin 1300units/hr currently infusing. Will schedule heparin drip to stop this evening, followed by administration of patients 1st dose of enoxaparin.   Enoxaparin 1mg /kg (90mg ) every 12 hours to start tonight at 2000 for treatment of DVT.   Discussed treatment plan with patient's nurse. Monitor platelets by anticoagulation protocol.   Pernell Dupre, PharmD  Clinical Pharmacist 01/22/2016,2:41 PM

## 2016-01-22 NOTE — Progress Notes (Signed)
ANTICOAGULATION CONSULT NOTE - Initial Consult  Pharmacy Consult for heparin drip Indication: DVT  Allergies  Allergen Reactions  . Codeine Itching  . Penicillins Itching    Patient Measurements: Height: 5\' 11"  (180.3 cm) Weight: 203 lb 8 oz (92.3 kg) IBW/kg (Calculated) : 75.3 Heparin Dosing Weight: 92 kg  Vital Signs: Temp: 98.2 F (36.8 C) (11/21 0443) BP: 108/48 (11/21 0443) Pulse Rate: 90 (11/21 0443)  Labs:  Recent Labs  01/20/16 0453 01/21/16 0447 01/22/16 1013  HGB 13.7 12.4* 13.0  HCT 41.4 37.2* 39.3*  PLT 106* 84* 93*  APTT  --  34  --   LABPROT  --  17.0*  --   INR  --  1.37  --   HEPARINUNFRC 0.48 <0.10* 0.22*  CREATININE 1.60* 1.56*  --     Estimated Creatinine Clearance: 42.4 mL/min (by C-G formula based on SCr of 1.56 mg/dL (H)).   Medical History: Past Medical History:  Diagnosis Date  . Clotting disorder ([place])   . Diabetes mellitus without complication ([place])   . Heart disease   . Hypertension    Medications:  Scheduled:  . allopurinol  300 mg Oral Daily  . carvedilol  6.25 mg Oral BID WC  . dronabinol  2.5 mg Oral BID AC  . gentamicin irrigation   Irrigation Once  . insulin aspart  0-9 Units Subcutaneous TID WC  . insulin glargine  5 Units Subcutaneous QHS  . ketoconazole   Topical Daily  . pneumococcal 23 valent vaccine  0.5 mL Intramuscular Tomorrow-1000  . sodium bicarbonate  650 mg Oral BID   Assessment: Pharmacy consulted to dose and monitor heparin drip in this 82 year old male admitted for tumor lysis syndrome and diagnosed with an acute lower extremity DVT.   Patient was taking rivaroxaban prior to admission for a history of DVT and was converted to apixaban on admission due to ARF and CrCl < 30 mL/min. Patient has received 2 doses of apixaban during admission - last dose at 2206 on 11/15.   Baseline labs (CBC, INR, APTT, and heparin level) were obtained   Goal of Therapy:  Heparin level 0.3-0.7 units/ml aPTT 66 - 102  seconds Monitor platelets by anticoagulation protocol: Yes   Plan:  Patient now S/P CT guided bone marrow aspiration and biopsy of right iliac crest and S/P Port-A-Cath placement right internal jugular vein.   Drip ordered to start at heparin 1100units/hr at 0200 on 11/21. Will order heparin level for 8 hours after initiation.   11/21 1000 HL: 0.22.  Will increase infusion to 1300 units/hr and recheck heparin level in 8 hours.    Pernell Dupre, PharmD  Clinical Pharmacist 01/22/2016,11:20 AM

## 2016-01-22 NOTE — Progress Notes (Signed)
[facility]  ROUNDING NOTE   Subjective:   Family at bedside.  States that his appetite is poor.  He is able to drink fluids however Serum creatinine 1.56 noted yesterday.  Objective:  Vital signs in last 24 hours:  Temp:  [97.5 F (36.4 C)-98.7 F (37.1 C)] 98.7 F (37.1 C) (11/21 1336) Pulse Rate:  [80-91] 87 (11/21 1336) Resp:  [16-29] 16 (11/21 0443) BP: (92-132)/(41-61) 92/46 (11/21 1336) SpO2:  [89 %-95 %] 93 % (11/21 1336)  Weight change:  Filed Weights   01/15/16 1430 01/15/16 1533 01/15/16 1613  Weight: 92.3 kg (203 lb 8 oz) 92.3 kg (203 lb 8 oz) 92.3 kg (203 lb 8 oz)    Intake/Output: I/O last 3 completed shifts: In: 288.9 [I.V.:288.9] Out: 1000 [Urine:1000]   Intake/Output this shift:  Total I/O In: 240 [P.O.:240] Out: -   Physical Exam: General: NAD,   Head: Normocephalic, atraumatic. Moist oral mucosal membranes  Eyes: Anicteric,   Neck: Supple, trachea midline  Lungs:  Clear to auscultation  Heart: Regular rate and rhythm  Abdomen:  Soft, nontender,   Extremities:  left extremity with 1+ edema, no right lower extremity edema  Neurologic: Nonfocal, moving all four extremities  Skin: No lesions       Basic Metabolic Panel:  Recent Labs Lab 01/16/16 0445 01/17/16 0449 01/18/16 0346 01/19/16 0318 01/20/16 0453 01/21/16 0447  NA 136 136 135 134* 133* 134*  K 5.1 4.5 4.5 4.6 5.0  --   CL 109 110 108 110 106  --   CO2 20* 20* 19* 20* 18* 20*  GLUCOSE 145* 110* 69 104* 122*  --   BUN 34* 27* 22* 19 18  --   CREATININE 1.92* 1.68* 1.60*  1.55* 1.59* 1.60* 1.56*  CALCIUM 8.0* 7.8* 7.7* 7.6* 8.1*  --   MG  --  1.5* 1.8  --   --   --   PHOS 2.4* 2.4* 2.3*  --  2.7 3.2    Liver Function Tests: No results for input(s): AST, ALT, ALKPHOS, BILITOT, PROT, ALBUMIN in the last 168 hours. No results for input(s): LIPASE, AMYLASE in the last 168 hours. No results for input(s): AMMONIA in the last 168 hours.  CBC:  Recent Labs Lab  01/18/16 1320 01/19/16 0318 01/20/16 0453 01/21/16 0447 01/22/16 1013  WBC 3.8 3.0* 3.8 3.2* 3.6*  NEUTROABS  --   --   --  2.2  --   HGB 13.3 12.1* 13.7 12.4* 13.0  HCT 41.7 37.3* 41.4 37.2* 39.3*  MCV 79.0* 78.4* 79.7* 77.8* 79.5*  PLT 102* 81* 106* 84* 93*    Cardiac Enzymes: No results for input(s): CKTOTAL, CKMB, CKMBINDEX, TROPONINI in the last 168 hours.  BNP: Invalid input(s): POCBNP  CBG:  Recent Labs Lab 01/21/16 0733 01/21/16 1716 01/21/16 2118 01/22/16 0731 01/22/16 1210  GLUCAP 88 137* 119* 103* 119*    Microbiology: Results for orders placed or performed during the hospital encounter of 01/15/16  C difficile quick scan w PCR reflex     Status: None   Collection Time: 01/20/16  8:05 AM  Result Value Ref Range Status   C Diff antigen NEGATIVE NEGATIVE Final   C Diff toxin NEGATIVE NEGATIVE Final   C Diff interpretation No C. difficile detected.  Final  Gastrointestinal Panel by PCR , Stool     Status: None   Collection Time: 01/20/16  8:05 AM  Result Value Ref Range Status   Campylobacter species NOT DETECTED  NOT DETECTED Final   Plesimonas shigelloides NOT DETECTED NOT DETECTED Final   Salmonella species NOT DETECTED NOT DETECTED Final   Yersinia enterocolitica NOT DETECTED NOT DETECTED Final   Vibrio species NOT DETECTED NOT DETECTED Final   Vibrio cholerae NOT DETECTED NOT DETECTED Final   Enteroaggregative E coli (EAEC) NOT DETECTED NOT DETECTED Final   Enteropathogenic E coli (EPEC) NOT DETECTED NOT DETECTED Final   Enterotoxigenic E coli (ETEC) NOT DETECTED NOT DETECTED Final   Shiga like toxin producing E coli (STEC) NOT DETECTED NOT DETECTED Final   Shigella/Enteroinvasive E coli (EIEC) NOT DETECTED NOT DETECTED Final   Cryptosporidium NOT DETECTED NOT DETECTED Final   Cyclospora cayetanensis NOT DETECTED NOT DETECTED Final   Entamoeba histolytica NOT DETECTED NOT DETECTED Final   Giardia lamblia NOT DETECTED NOT DETECTED Final    Adenovirus F40/41 NOT DETECTED NOT DETECTED Final   Astrovirus NOT DETECTED NOT DETECTED Final   Norovirus GI/GII NOT DETECTED NOT DETECTED Final   Rotavirus A NOT DETECTED NOT DETECTED Final   Sapovirus (I, II, IV, and V) NOT DETECTED NOT DETECTED Final    Coagulation Studies:  Recent Labs  01/21/16 0447  LABPROT 17.0*  INR 1.37    Urinalysis: No results for input(s): COLORURINE, LABSPEC, PHURINE, GLUCOSEU, HGBUR, BILIRUBINUR, KETONESUR, PROTEINUR, UROBILINOGEN, NITRITE, LEUKOCYTESUR in the last 72 hours.  Invalid input(s): APPERANCEUR    Imaging: Ct Biopsy  Result Date: 01/21/2016 INDICATION: Concern for lymphoma. Please perform CT-guided retroperitoneal lymph known biopsy and CT-guided bone marrow biopsy for tissue diagnostic purposes. EXAM: 1. CT-GUIDED BONE MARROW BIOPSY AND ASPIRATION 2. CT-GUIDED LEFT-SIDED RETROPERITONEAL MASS BIOPSY. COMPARISON:  PET-CT - 01/16/2016 MEDICATIONS: None ANESTHESIA/SEDATION: Fentanyl 50 mcg IV; Versed 1.5 mg IV Sedation Time: 30 minutes; The patient was continuously monitored during the procedure by the interventional radiology nurse under my direct supervision. COMPLICATIONS: None immediate. PROCEDURE: Informed consent was obtained from the patient following an explanation of the procedure, risks, benefits and alternatives. The patient understands, agrees and consents for the procedure. All questions were addressed. A time out was performed prior to the initiation of the procedure. The patient was positioned prone and non-contrast localization CT was performed of the pelvis to demonstrate the iliac marrow spaces as well as the left-sided retroperitoneal nodal conglomeration with dominant component measuring approximately 3.4 x 4.2 cm (image 36, series 2). Both operative sites were prepped and draped in the usual sterile fashion. Under sterile conditions and local anesthesia, a 22 gauge spinal needle was utilized for procedural planning. Next, an 11  gauge coaxial bone biopsy needle was advanced into the right iliac marrow space. Needle position was confirmed with CT imaging. Initially, bone marrow aspiration was performed. Next, a bone marrow biopsy was obtained with the 11 gauge outer bone marrow device. Samples were prepared with the cytotechnologist and deemed adequate. The needle was removed intact. Attention was now paid towards the retroperitoneal lymph node biopsy. A 22 gauge needle was utilized for procedural planning purposes. Next, a 2 gauge coaxial needle was advanced into the posterior peripheral aspect the left-sided retroperitoneal nodal conglomeration. Appropriate position was confirmed and 4 core needle biopsies were obtained with a 18 gauge biopsy device. Samples were placed on Telfa and submitted to pathology for analysis. The coaxial needle was removed and superficial hemostasis was achieved with manual compression. Dressings were placed. The patient tolerated procedure well without immediate postprocedural complication. IMPRESSION: 1. Technically successful CT guided right iliac bone marrow aspiration and core biopsy. 2. Technically successful CT-guided left-sided  retroperitoneal nodal conglomeration core needle biopsy. Electronically Signed   By: Sandi Mariscal M.D.   On: 01/21/2016 11:24   Ct Biopsy  Result Date: 01/21/2016 INDICATION: Concern for lymphoma. Please perform CT-guided retroperitoneal lymph known biopsy and CT-guided bone marrow biopsy for tissue diagnostic purposes. EXAM: 1. CT-GUIDED BONE MARROW BIOPSY AND ASPIRATION 2. CT-GUIDED LEFT-SIDED RETROPERITONEAL MASS BIOPSY. COMPARISON:  PET-CT - 01/16/2016 MEDICATIONS: None ANESTHESIA/SEDATION: Fentanyl 50 mcg IV; Versed 1.5 mg IV Sedation Time: 30 minutes; The patient was continuously monitored during the procedure by the interventional radiology nurse under my direct supervision. COMPLICATIONS: None immediate. PROCEDURE: Informed consent was obtained from the patient  following an explanation of the procedure, risks, benefits and alternatives. The patient understands, agrees and consents for the procedure. All questions were addressed. A time out was performed prior to the initiation of the procedure. The patient was positioned prone and non-contrast localization CT was performed of the pelvis to demonstrate the iliac marrow spaces as well as the left-sided retroperitoneal nodal conglomeration with dominant component measuring approximately 3.4 x 4.2 cm (image 36, series 2). Both operative sites were prepped and draped in the usual sterile fashion. Under sterile conditions and local anesthesia, a 22 gauge spinal needle was utilized for procedural planning. Next, an 11 gauge coaxial bone biopsy needle was advanced into the right iliac marrow space. Needle position was confirmed with CT imaging. Initially, bone marrow aspiration was performed. Next, a bone marrow biopsy was obtained with the 11 gauge outer bone marrow device. Samples were prepared with the cytotechnologist and deemed adequate. The needle was removed intact. Attention was now paid towards the retroperitoneal lymph node biopsy. A 22 gauge needle was utilized for procedural planning purposes. Next, a 63 gauge coaxial needle was advanced into the posterior peripheral aspect the left-sided retroperitoneal nodal conglomeration. Appropriate position was confirmed and 4 core needle biopsies were obtained with a 18 gauge biopsy device. Samples were placed on Telfa and submitted to pathology for analysis. The coaxial needle was removed and superficial hemostasis was achieved with manual compression. Dressings were placed. The patient tolerated procedure well without immediate postprocedural complication. IMPRESSION: 1. Technically successful CT guided right iliac bone marrow aspiration and core biopsy. 2. Technically successful CT-guided left-sided retroperitoneal nodal conglomeration core needle biopsy. Electronically  Signed   By: Sandi Mariscal M.D.   On: 01/21/2016 11:24     Medications:   . heparin 1,300 Units/hr (01/22/16 1200)   . allopurinol  300 mg Oral Daily  . carvedilol  6.25 mg Oral BID WC  . dronabinol  2.5 mg Oral BID AC  . gentamicin irrigation   Irrigation Once  . insulin aspart  0-9 Units Subcutaneous TID WC  . insulin glargine  5 Units Subcutaneous QHS  . ketoconazole   Topical Daily  . pneumococcal 23 valent vaccine  0.5 mL Intramuscular Tomorrow-1000  . sodium bicarbonate  650 mg Oral BID   acetaminophen **OR** acetaminophen, HYDROcodone-acetaminophen, ondansetron **OR** ondansetron (ZOFRAN) IV  Assessment/ Plan:  [name] is a 82 y.o. white male with new onset lymphoma, hypertension, diabetes mellitus type II, hyperlipidemia, coronary artery disease, DVT on xarelto, BPH, RBB, who was admitted to [facility] on 01/15/2016 for tumor lysis syndrome renal insufficiency hyperuremia   1. Acute renal failure on chronic kidney disease stage III: with hyperkalemia and hyponatremia: baseline creatinine of 1.6, eGFR of 42 from 12/31/15.  CT angiogram on 11/6. Poor PO intake: - supportive care  2. 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  Uric acid improved to 5.9 - encouraged oral fluid intake  3. Metabolic acidosis: from NS and acute renal failure - sodium bicarbonate POthis   LOS: 7 Hayden Martin 11/21/20171:37 PM

## 2016-01-23 ENCOUNTER — Other Ambulatory Visit: Payer: Self-pay | Admitting: Internal Medicine

## 2016-01-23 ENCOUNTER — Telehealth: Payer: Self-pay | Admitting: *Deleted

## 2016-01-23 DIAGNOSIS — N183 Chronic kidney disease, stage 3 (moderate): Secondary | ICD-10-CM

## 2016-01-23 DIAGNOSIS — C8580 Other specified types of non-Hodgkin lymphoma, unspecified site: Secondary | ICD-10-CM

## 2016-01-23 DIAGNOSIS — C8330 Diffuse large B-cell lymphoma, unspecified site: Secondary | ICD-10-CM

## 2016-01-23 DIAGNOSIS — Z7189 Other specified counseling: Secondary | ICD-10-CM

## 2016-01-23 DIAGNOSIS — R630 Anorexia: Secondary | ICD-10-CM

## 2016-01-23 DIAGNOSIS — Z515 Encounter for palliative care: Secondary | ICD-10-CM

## 2016-01-23 LAB — GLUCOSE, CAPILLARY
GLUCOSE-CAPILLARY: 164 mg/dL — AB (ref 65–99)
Glucose-Capillary: 95 mg/dL (ref 65–99)
Glucose-Capillary: 120 mg/dL — ABNORMAL HIGH (ref 65–99)
Glucose-Capillary: 113 mg/dL — ABNORMAL HIGH (ref 65–99)

## 2016-01-23 LAB — SURGICAL PATHOLOGY

## 2016-01-23 LAB — CBC
HEMATOCRIT: 37.5 % — AB (ref 40.0–52.0)
HEMOGLOBIN: 12.3 g/dL — AB (ref 13.0–18.0)
MCH: 25.6 pg — AB (ref 26.0–34.0)
MCHC: 32.7 g/dL (ref 32.0–36.0)
MCV: 78.3 fL — ABNORMAL LOW (ref 80.0–100.0)
Platelets: 84 10*3/uL — ABNORMAL LOW (ref 150–440)
RBC: 4.78 MIL/uL (ref 4.40–5.90)
RDW: 19.9 % — ABNORMAL HIGH (ref 11.5–14.5)
WBC: 3.2 10*3/uL — ABNORMAL LOW (ref 3.8–10.6)

## 2016-01-23 LAB — CREATININE, SERUM
Creatinine, Ser: 1.61 mg/dL — ABNORMAL HIGH (ref 0.61–1.24)
GFR calc Af Amer: 44 mL/min — ABNORMAL LOW (ref >60)
GFR, EST NON AFRICAN AMERICAN: 38 mL/min — AB (ref >60)

## 2016-01-23 MED ORDER — SODIUM BICARBONATE 8.4 % IV SOLN
INTRAVENOUS | Status: DC
  Administered 2016-01-23 – 2016-01-24 (×2): via INTRAVENOUS
  Filled 2016-01-23 (×7): qty 150

## 2016-01-23 MED ORDER — PREDNISONE 5 MG PO TABS
50.0000 mg | ORAL_TABLET | Freq: Two times a day (BID) | ORAL | Status: AC
  Administered 2016-01-23 – 2016-01-28 (×10): 50 mg via ORAL
  Filled 2016-01-23 (×10): qty 2

## 2016-01-23 MED ORDER — RITUXIMAB CHEMO INJECTION 500 MG/50ML
375.0000 mg/m2 | Freq: Once | INTRAVENOUS | Status: AC
  Administered 2016-01-24: 800 mg via INTRAVENOUS
  Filled 2016-01-23: qty 80

## 2016-01-23 MED ORDER — ALPRAZOLAM 0.25 MG PO TABS: 0.2500 mg | ORAL_TABLET | Freq: Four times a day (QID) | ORAL | Status: DC | PRN

## 2016-01-23 MED ORDER — SODIUM CHLORIDE 0.9 % IV SOLN
10.0000 mg | Freq: Once | INTRAVENOUS | Status: AC
  Administered 2016-01-23: 10 mg via INTRAVENOUS
  Filled 2016-01-23: qty 1

## 2016-01-23 MED ORDER — ACETAMINOPHEN 325 MG PO TABS
650.0000 mg | ORAL_TABLET | Freq: Once | ORAL | Status: AC
  Administered 2016-01-24: 650 mg via ORAL
  Filled 2016-01-23: qty 2

## 2016-01-23 MED ORDER — SODIUM CHLORIDE 0.9 % IV SOLN
1.0000 mg | Freq: Once | INTRAVENOUS | Status: AC
  Administered 2016-01-23: 1 mg via INTRAVENOUS
  Filled 2016-01-23: qty 1

## 2016-01-23 MED ORDER — DIPHENHYDRAMINE HCL 25 MG PO CAPS
50.0000 mg | ORAL_CAPSULE | Freq: Once | ORAL | Status: AC
Start: 2016-01-23 — End: 2016-01-24
  Administered 2016-01-24: 10:00:00 50 mg via ORAL
  Filled 2016-01-23: qty 2

## 2016-01-23 MED ORDER — SODIUM CHLORIDE 0.9 % IV SOLN
Freq: Once | INTRAVENOUS | Status: AC
  Administered 2016-01-23: 18:00:00 via INTRAVENOUS

## 2016-01-23 MED ORDER — DOXORUBICIN HCL CHEMO IV INJECTION 2 MG/ML
25.0000 mg/m2 | Freq: Once | INTRAVENOUS | Status: AC
  Administered 2016-01-23: 18:00:00 54 mg via INTRAVENOUS
  Filled 2016-01-23: qty 27

## 2016-01-23 MED ORDER — PALONOSETRON HCL INJECTION 0.25 MG/5ML
0.2500 mg | Freq: Once | INTRAVENOUS | Status: AC
  Administered 2016-01-23: 0.25 mg via INTRAVENOUS
  Filled 2016-01-23: qty 5

## 2016-01-23 MED ORDER — SODIUM CHLORIDE 0.9 % IV SOLN
400.0000 mg/m2 | Freq: Once | INTRAVENOUS | Status: AC
  Administered 2016-01-23: 19:00:00 860 mg via INTRAVENOUS
  Filled 2016-01-23: qty 43

## 2016-01-23 NOTE — Progress Notes (Signed)
Patient received cytoxan, vincristine and doxorubicin without complications. VSS. Patient resting comfortably and family at bedside.

## 2016-01-23 NOTE — Consult Note (Signed)
Consultation Note Date: 01/23/2016   Patient Name: Hayden Martin  DOB: 1931-08-29  MRN: [id number]  Age / Sex: 82 y.o., male  PCP: Kirk Ruths, MD Referring Physician: Henreitta Leber, MD  Reason for Consultation: Establishing goals of care and Psychosocial/spiritual support  HPI/Patient Profile: 82 y.o. male  admitted on 01/15/2016 with  a known history of  DVT, diabetes, hypertension who is referred from oncology office due to worsening renal failure, elevated LDH level, and elevated uric acid level who was seen by oncology first time in November 13. Patient previously was noted to have weight loss and abnormal blood work and abdominal CT revealed multiple solid masses within the spleen as well as extensive lymphadenopathy.   Patient was planned to have a PET scan and a biopsy of this. Who went for follow-up and had repeated blood work which showed his has elevated creatinine and his uric acid level is high and LDH is high as well. Therefore he is admitted for further evaluation and therapy. Patient complains of feeling very weak and tired and has not been able to walk.  Plan is to initiate chemotherapy today, patient and family spoke with Dr Rogue Bussing today.  Early palliative intervention for emotional support and assistance in navigating health care decisions and anticipatory care needs.  Clinical Assessment and Goals of Care  This NP Wadie Lessen reviewed medical records, received report from team, assessed the patient and then meet at the patient's bedside along with his son and daughter/Lisa to introduce the role of Palliative Medicine into a holistic treatment plan.   A  discussion was had today regarding advanced directives.  We discussed the importance of discussion with family and documentation of in order to enhance patient centered care.   Values and goals of care important to patient  and family were attempted to be elicited.  Patient clearly verbalizes his goal is to get "more quality time"   Questions and concerns addressed.   Family encouraged to call with questions or concerns.  PMT will continue to support holistically.    SUMMARY OF RECOMMENDATIONS    Code Status/Advance Care Planning:  Full code-  Symptom Management:   Anxiety: Alprazolam 0.25 po every 6 hrs prn  Palliative Prophylaxis:   Aspiration, Bowel Regimen, Delirium Protocol, Frequent Pain Assessment and Oral Care   Psycho-social/Spiritual:    Strong family support  Prognosis:   Unable to determine  Discharge Planning: [place] for rehab with Palliative care service follow-up      Primary Diagnoses: Present on Admission: . Tumor lysis syndrome   I have reviewed the medical record, interviewed the patient and family, and examined the patient. The following aspects are pertinent.  Past Medical History:  Diagnosis Date  . Clotting disorder ([place])   . Diabetes mellitus without complication ([place])   . Heart disease   . Hypertension    Social History   Social History  . Marital status: Widowed    Spouse name: N/A  . Number of children: N/A  .  Years of education: N/A   Social History Main Topics  . Smoking status: Former Smoker    Packs/day: 1.50    Quit date: 01/01/1986  . Smokeless tobacco: Former Systems developer     Comment: Smoked for 20 years  . Alcohol use None  . Drug use: Unknown  . Sexual activity: Not Asked   Other Topics Concern  . None   Social History Narrative  . None   History reviewed. No pertinent family history. Scheduled Meds: . sodium chloride   Intravenous Once  . acetaminophen  650 mg Oral Once  . allopurinol  300 mg Oral Daily  . carvedilol  6.25 mg Oral BID WC  . cyclophosphamide  400 mg/m2 (Treatment Plan Recorded) Intravenous Once  . dexamethasone (DECADRON) IVPB [facility]  10 mg Intravenous Once  . diphenhydrAMINE  50 mg Oral Once  .  DOXOrubicin  25 mg/m2 (Treatment Plan Recorded) Intravenous Once  . dronabinol  2.5 mg Oral BID AC  . enoxaparin (LOVENOX) injection  90 mg Subcutaneous Q12H  . gentamicin irrigation   Irrigation Once  . insulin aspart  0-9 Units Subcutaneous TID WC  . insulin glargine  5 Units Subcutaneous QHS  . ketoconazole   Topical Daily  . palonosetron  0.25 mg Intravenous Once  . pneumococcal 23 valent vaccine  0.5 mL Intramuscular Tomorrow-1000  . predniSONE  50 mg Oral BID WC  . riTUXimab (RITUXAN) IV infusion  375 mg/m2 (Treatment Plan Recorded) Intravenous Once  . vinCRIStine (ONCOVIN) CHEMO IV infusion  1 mg Intravenous Once   Continuous Infusions: .  sodium bicarbonate  infusion 1000 mL     PRN Meds:.acetaminophen **OR** acetaminophen, ALPRAZolam, HYDROcodone-acetaminophen, ondansetron **OR** ondansetron (ZOFRAN) IV Medications Prior to Admission:  Prior to Admission medications   Medication Sig Start Date End Date Taking? Authorizing Provider  rivaroxaban (XARELTO) 20 MG TABS tablet Take by mouth.   Yes Historical Provider, MD  aspirin (GOODSENSE ASPIRIN) 325 MG tablet Take by mouth.    Historical Provider, MD  atorvastatin (LIPITOR) 20 MG tablet Take 20 mg by mouth daily.     Historical Provider, MD  carvedilol (COREG) 6.25 MG tablet TAKE ONE (1) TABLET BY MOUTH TWO (2) TIMES DAILY 02/05/15   Historical Provider, MD  insulin aspart (NOVOLOG) 100 UNIT/ML FlexPen Inject 8 Units into the skin 3 (three) times daily with meals.     Historical Provider, MD  Insulin Glargine (LANTUS [place]) Inject into the skin. Inject 35 units subcutaneously at bedtime.    Historical Provider, MD  ketoconazole (NIZORAL) 2 % cream APPLY AT BEDTIME DAILY TO AFFECTED AREAS 12/27/14   Historical Provider, MD  lisinopril-hydrochlorothiazide (PRINZIDE,ZESTORETIC) 20-25 MG tablet TAKE ONE (1) TABLET BY MOUTH EVERY DAY 08/20/15   Historical Provider, MD   Allergies  Allergen Reactions  . Codeine Itching  . Penicillins  Itching   Review of Systems  Constitutional: Positive for fatigue and unexpected weight change.  Neurological: Positive for weakness.    Physical Exam  Constitutional: He appears well-developed. He appears ill.  HENT:  Mouth/Throat: Oropharynx is clear and moist.  Cardiovascular: Normal rate, regular rhythm and normal heart sounds.   Pulmonary/Chest: Effort normal and breath sounds normal.  Neurological: He is alert.  Skin: Skin is warm and dry. Bruising noted.  right chest wall bruising     Vital Signs: BP (!) 120/51   Pulse 84   Temp 98.8 F (37.1 C) (Oral)   Resp 17   Ht 5\' 11"  (1.803 m)   Wt  92.3 kg (203 lb 8 oz)   SpO2 91%   BMI 28.38 kg/m  Pain Assessment: No/denies pain POSS *See Group Information*: 1-Acceptable,Awake and alert Pain Score: 0-No pain   SpO2: SpO2: 91 % O2 Device:SpO2: 91 % O2 Flow Rate: .O2 Flow Rate (L/min): 2 L/min  IO: Intake/output summary:  Intake/Output Summary (Last 24 hours) at 01/23/16 1450 Last data filed at 01/23/16 0800  Gross per 24 hour  Intake              600 ml  Output              100 ml  Net              500 ml    LBM: Last BM Date: 01/23/16 Baseline Weight: Weight: 92.3 kg (203 lb 8 oz) Most recent weight: Weight: 92.3 kg (203 lb 8 oz)     Palliative Assessment/Data: 60%   Discussed with Bedside Nurse and CMRN  Time In: 1345 Time Out: 1500 Time Total: 75 min Greater than 50%  of this time was spent counseling and coordinating care related to the above assessment and plan.  Signed by: Wadie Lessen, NP   Please contact Palliative Medicine Team phone at [phone] for questions and concerns.  For individual provider: See Shea Evans

## 2016-01-23 NOTE — Progress Notes (Signed)
Discussed with Dr Rogue Bussing. Patient is starting chemotherapy. Expected to have tumor lysis. Start iv sodium bicarb infusion

## 2016-01-23 NOTE — Telephone Encounter (Addendum)
Bone marrow is negative for Lymphoma, need to look elsewhere for Dx. Report will not be available until Friday

## 2016-01-23 NOTE — Progress Notes (Signed)
[facility] NAME: Hayden Martin    MR#:  [id number]  DATE OF BIRTH:  02/20/1932  SUBJECTIVE:   PO intake poor.  Family at bedside.  Still awaiting Bone Marrow Biopsy results. Possibly to be started on Chemo after results are back today.  Platelet count stable.    REVIEW OF SYSTEMS:    Review of Systems  Constitutional: Negative for chills and fever.  HENT: Negative for congestion and tinnitus.   Eyes: Negative for blurred vision and double vision.  Respiratory: Negative for cough, shortness of breath and wheezing.   Cardiovascular: Negative for chest pain, orthopnea and PND.  Gastrointestinal: Negative for abdominal pain, diarrhea, nausea and vomiting.  Genitourinary: Negative for dysuria and hematuria.  Neurological: Positive for weakness. Negative for dizziness, sensory change and focal weakness.  All other systems reviewed and are negative.   Nutrition: Carb control Tolerating Diet: Yes but poor. Tolerating PT: Eval noted    DRUG ALLERGIES:   Allergies  Allergen Reactions  . Codeine Itching  . Penicillins Itching    VITALS:  Blood pressure (!) 120/51, pulse 84, temperature 98.8 F (37.1 C), temperature source Oral, resp. rate 17, height _0  (1.803 m), weight 92.3 kg (203 lb 8 oz), SpO2 91 %.  PHYSICAL EXAMINATION:   Physical Exam  GENERAL:  82 y.o.-year-old patient lying in the bed in no acute distress.  EYES: Pupils equal, round, reactive to light and accommodation. No scleral icterus. Extraocular muscles intact.  HEENT: Head atraumatic, normocephalic. Oropharynx and nasopharynx clear.  NECK:  Supple, no jugular venous distention. No thyroid enlargement, no tenderness.  LUNGS: Normal breath sounds bilaterally, no wheezing, rales, rhonchi. No use of accessory muscles of respiration.  CARDIOVASCULAR: S1, S2 normal. No murmurs, rubs, or gallops.  ABDOMEN: Soft, nontender, nondistended. Bowel sounds present. No  organomegaly or mass.  EXTREMITIES: No cyanosis, clubbing, +1-2 edema on LLE > Right.  NEUROLOGIC: Cranial nerves II through XII are intact. No focal Motor or sensory deficits b/l. Globally weak  PSYCHIATRIC: The patient is alert and oriented x 3.  SKIN: No obvious rash, lesion, or ulcer.    LABORATORY PANEL:   CBC  Recent Labs Lab 01/23/16 0812  WBC 3.2*  HGB 12.3*  HCT 37.5*  PLT 84*   ------------------------------------------------------------------------------------------------------------------  Chemistries   Recent Labs Lab 01/18/16 0346  01/20/16 0453 01/21/16 0447 01/23/16 0812  NA 135  < > 133* 134*  --   K 4.5  < > 5.0  --   --   CL 108  < > 106  --   --   CO2 19*  < > 18* 20*  --   GLUCOSE 69  < > 122*  --   --   BUN 22*  < > 18  --   --   CREATININE 1.60*  1.55*  < > 1.60* 1.56* 1.61*  CALCIUM 7.7*  < > 8.1*  --   --   MG 1.8  --   --   --   --   < > = values in this interval not displayed. ------------------------------------------------------------------------------------------------------------------  Cardiac Enzymes No results for input(s): TROPONINI in the last 168 hours. ------------------------------------------------------------------------------------------------------------------  RADIOLOGY:  No results found.   ASSESSMENT AND PLAN:   82 year old male with past medical history of diabetes, hypertension, previous history of DVT, suspected lymphoma, admitted to the hospital due to acute renal failure secondary to hyperuricemia and tumor lysis syndrome.  1. Acute on  chronic renal failure-secondary to tumor lysis syndrome. Creatinine has improved with IV fluids and close to baseline now. - appreciate Nephro input and improved w/ alkalization of urine w/ Bicarb.   2. Metabolic Acidosis - due to ARF and improved w/ Bicarb and as renal function has improved.   3. Hyperkalemia - due to AKI and now resolved.   4. Hyperuricemia - due to Tumor  lysis syndrome from suspected Lymphoma.  - resolved. Cont. Allopurinol.   5. Suspected Lymphoma - PET Scan as outpatient + for lymph nodes in the neck chest abdomen pelvis and also splenomegaly. Patient is status post bone marrow biopsy and Port-A-Cath placement. - awaiting Path results today and to be started on Chemo possibly today.   6. History of DVT-patient is status post IVC filter placement. - switched from heparin to Lovenox SQ yesterday and will cont.   7. Anemia/thrombocytopenia-secondary to the suspected lymphoma. Follow serial counts. Transfuse as needed.  8. Diabetes type 2 without complication-continue sliding scale insulin, Lantus. - BS stable.   9. HTN - cont. Coreg.   PT eval noted and pt. Will need SNF upon discharged and social work made aware.   All the records are reviewed and case discussed with Care Management/Social Worker. Management plans discussed with the patient, family and they are in agreement.  CODE STATUS: Full code  DVT Prophylaxis: Lovenox.  TOTAL TIME TAKING CARE OF THIS PATIENT: 25 minutes.   POSSIBLE D/C IN 2-3 DAYS, DEPENDING ON CLINICAL CONDITION.   Henreitta Leber M.D on 01/23/2016 at 1:34 PM  Between 7am to 6pm - Pager - [phone]  After 6pm go to [email or web address] - Proofreader  [facility]  Office  [phone]  CC: Primary care physician; Kirk Ruths., MD

## 2016-01-23 NOTE — Progress Notes (Signed)
[facility]  ROUNDING NOTE   Subjective:   Family at bedside.  States that his appetite continues to be poor.  He is primarily eating orange sherbet Serum creatinine 1.61/GFR 38  Objective:  Vital signs in last 24 hours:  Temp:  [97.8 F (36.6 C)-98.8 F (37.1 C)] 98.8 F (37.1 C) (11/22 0353) Pulse Rate:  [82-87] 84 (11/22 0837) Resp:  [17-18] 17 (11/22 0353) BP: (92-126)/(39-57) 120/51 (11/22 0837) SpO2:  [91 %-94 %] 91 % (11/22 0353)  Weight change:  Filed Weights   01/15/16 1430 01/15/16 1533 01/15/16 1613  Weight: 92.3 kg (203 lb 8 oz) 92.3 kg (203 lb 8 oz) 92.3 kg (203 lb 8 oz)    Intake/Output: I/O last 3 completed shifts: In: 991.9 [P.O.:960; I.V.:31.9] Out: 200 [Urine:200]   Intake/Output this shift:  Total I/O In: 120 [P.O.:120] Out: -   Physical Exam: General: NAD,   Head: Normocephalic, atraumatic. Moist oral mucosal membranes  Eyes: Anicteric,   Neck: Supple, trachea midline  Lungs:  Clear to auscultation  Heart: Regular rate and rhythm  Abdomen:  Soft, nontender,   Extremities:  left extremity with 1+ edema, no right lower extremity edema  Neurologic: Nonfocal, moving all four extremities  Skin: No lesions       Basic Metabolic Panel:  Recent Labs Lab 01/17/16 0449 01/18/16 0346 01/19/16 0318 01/20/16 0453 01/21/16 0447 01/23/16 0812  NA 136 135 134* 133* 134*  --   K 4.5 4.5 4.6 5.0  --   --   CL 110 108 110 106  --   --   CO2 20* 19* 20* 18* 20*  --   GLUCOSE 110* 69 104* 122*  --   --   BUN 27* 22* 19 18  --   --   CREATININE 1.68* 1.60*  1.55* 1.59* 1.60* 1.56* 1.61*  CALCIUM 7.8* 7.7* 7.6* 8.1*  --   --   MG 1.5* 1.8  --   --   --   --   PHOS 2.4* 2.3*  --  2.7 3.2  --     Liver Function Tests: No results for input(s): AST, ALT, ALKPHOS, BILITOT, PROT, ALBUMIN in the last 168 hours. No results for input(s): LIPASE, AMYLASE in the last 168 hours. No results for input(s): AMMONIA in the last 168  hours.  CBC:  Recent Labs Lab 01/19/16 0318 01/20/16 0453 01/21/16 0447 01/22/16 1013 01/23/16 0812  WBC 3.0* 3.8 3.2* 3.6* 3.2*  NEUTROABS  --   --  2.2  --   --   HGB 12.1* 13.7 12.4* 13.0 12.3*  HCT 37.3* 41.4 37.2* 39.3* 37.5*  MCV 78.4* 79.7* 77.8* 79.5* 78.3*  PLT 81* 106* 84* 93* 84*    Cardiac Enzymes: No results for input(s): CKTOTAL, CKMB, CKMBINDEX, TROPONINI in the last 168 hours.  BNP: Invalid input(s): POCBNP  CBG:  Recent Labs Lab 01/22/16 1210 01/22/16 1647 01/22/16 2045 01/23/16 0731 01/23/16 1149  GLUCAP 119* 129* 107* 95 120*    Microbiology: Results for orders placed or performed during the hospital encounter of 01/15/16  C difficile quick scan w PCR reflex     Status: None   Collection Time: 01/20/16  8:05 AM  Result Value Ref Range Status   C Diff antigen NEGATIVE NEGATIVE Final   C Diff toxin NEGATIVE NEGATIVE Final   C Diff interpretation No C. difficile detected.  Final  Gastrointestinal Panel by PCR , Stool     Status: None   Collection  Time: 01/20/16  8:05 AM  Result Value Ref Range Status   Campylobacter species NOT DETECTED NOT DETECTED Final   Plesimonas shigelloides NOT DETECTED NOT DETECTED Final   Salmonella species NOT DETECTED NOT DETECTED Final   Yersinia enterocolitica NOT DETECTED NOT DETECTED Final   Vibrio species NOT DETECTED NOT DETECTED Final   Vibrio cholerae NOT DETECTED NOT DETECTED Final   Enteroaggregative E coli (EAEC) NOT DETECTED NOT DETECTED Final   Enteropathogenic E coli (EPEC) NOT DETECTED NOT DETECTED Final   Enterotoxigenic E coli (ETEC) NOT DETECTED NOT DETECTED Final   Shiga like toxin producing E coli (STEC) NOT DETECTED NOT DETECTED Final   Shigella/Enteroinvasive E coli (EIEC) NOT DETECTED NOT DETECTED Final   Cryptosporidium NOT DETECTED NOT DETECTED Final   Cyclospora cayetanensis NOT DETECTED NOT DETECTED Final   Entamoeba histolytica NOT DETECTED NOT DETECTED Final   Giardia lamblia NOT  DETECTED NOT DETECTED Final   Adenovirus F40/41 NOT DETECTED NOT DETECTED Final   Astrovirus NOT DETECTED NOT DETECTED Final   Norovirus GI/GII NOT DETECTED NOT DETECTED Final   Rotavirus A NOT DETECTED NOT DETECTED Final   Sapovirus (I, II, IV, and V) NOT DETECTED NOT DETECTED Final    Coagulation Studies:  Recent Labs  01/21/16 0447  LABPROT 17.0*  INR 1.37    Urinalysis: No results for input(s): COLORURINE, LABSPEC, PHURINE, GLUCOSEU, HGBUR, BILIRUBINUR, KETONESUR, PROTEINUR, UROBILINOGEN, NITRITE, LEUKOCYTESUR in the last 72 hours.  Invalid input(s): APPERANCEUR    Imaging: No results found.   Medications:    . allopurinol  300 mg Oral Daily  . carvedilol  6.25 mg Oral BID WC  . dronabinol  2.5 mg Oral BID AC  . enoxaparin (LOVENOX) injection  90 mg Subcutaneous Q12H  . gentamicin irrigation   Irrigation Once  . insulin aspart  0-9 Units Subcutaneous TID WC  . insulin glargine  5 Units Subcutaneous QHS  . ketoconazole   Topical Daily  . pneumococcal 23 valent vaccine  0.5 mL Intramuscular Tomorrow-1000  . sodium bicarbonate  650 mg Oral BID   acetaminophen **OR** acetaminophen, HYDROcodone-acetaminophen, ondansetron **OR** ondansetron (ZOFRAN) IV  Assessment/ Plan:  Hayden Martin is a 82 y.o. white male with new onset lymphoma, hypertension, diabetes mellitus type II, hyperlipidemia, coronary artery disease, DVT on xarelto, BPH, RBB, who was admitted to [facility] on 01/15/2016 for tumor lysis syndrome renal insufficiency hyperuremia   1. Acute renal failure on chronic kidney disease stage III: with hyperkalemia and hyponatremia: baseline creatinine of 1.6, eGFR of 38 from 12/31/15.  Admission creatinine 2.34 CT angiogram on 11/6. Poor PO intake -continue supportive care Today's Cr 1.6/GFR 38  2. 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 Uric acid improved to 5.9 - encouraged oral fluid intake  3. Metabolic acidosis: from NS and acute renal  failure - sodium bicarbonate  4. Lymphoma and CLL - chemotherapy as per Oncology team     LOS: 8 Hayden Martin 11/22/201712:15 PM

## 2016-01-23 NOTE — Telephone Encounter (Signed)
  Patient also had lymph node biopsy on Monday.  Is this path back?  M

## 2016-01-23 NOTE — Clinical Social Work Note (Signed)
MSW received consult that patient may need SNF placement.  MSW continuing to follow patient's progress.  Jones Broom. Norval Morton, MSW [phone]  Mon-Fri 8a-4:30p 01/23/2016 5:42 PM

## 2016-01-23 NOTE — Progress Notes (Signed)
START ON PATHWAY REGIMEN - Lymphoma and CLL  Other Clinical Trial: mini rchop  Patient Characteristics: High Grade (such as Burkitt's) Disease Type: Not Applicable Disease Type: High Grade (such as Burkitt's) [place] Stage: IVBE  Intent of Therapy: Curative Intent, Discussed with Patient

## 2016-01-23 NOTE — Progress Notes (Signed)
Hayden Martin   DOB:07/25/1931   [id number]    Subjective: Patient resting comfortably in the bed. Accompanied by multiple family members. Admits to poor appetite. No nausea no vomiting.  Objective:  Vitals:   01/23/16 1759 01/23/16 1829  BP: (!) 135/48 (!) 114/47  Pulse: 85 86  Resp: 18 18  Temp: 98.3 F (36.8 C) 97.8 F (36.6 C)     Intake/Output Summary (Last 24 hours) at 01/23/16 1907 Last data filed at 01/23/16 1828  Gross per 24 hour  Intake            801.9 ml  Output              100 ml  Net            701.9 ml    GENERAL Alert, no distress and comfortable.  EYES: no pallor or icterusAccompanied by multiple family members. OROPHARYNX: no thrush or ulceration. NECK: supple, no masses felt LYMPH:  no palpable lymphadenopathy in the cervical, axillary or inguinal regions LUNGS: decreased breath sounds to auscultation at bases and  No wheeze or crackles HEART/CVS: regular rate & rhythm and no murmurs; No lower extremity edema ABDOMEN: abdomen soft, tender  on deep palpation. and normal bowel sounds positive for enlarged spleen. Musculoskeletal:no cyanosis of digits and no clubbing  PSYCH: alert & oriented x 3 with fluent speech NEURO: no focal motor/sensory deficits SKIN:  Multiple bruises more so around the right chest wall Mediport.   Labs:  Lab Results  Component Value Date   WBC 3.2 (L) 01/23/2016   HGB 12.3 (L) 01/23/2016   HCT 37.5 (L) 01/23/2016   MCV 78.3 (L) 01/23/2016   PLT 84 (L) 01/23/2016   NEUTROABS 2.2 01/21/2016    Lab Results  Component Value Date   NA 134 (L) 01/21/2016   K 5.0 01/20/2016   CL 106 01/20/2016   CO2 20 (L) 01/21/2016    Studies:  No results found.  Assessment & Plan:   # 82 year old male patient currently admitted to the hospital/with tumor lysis syndrome.  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 2-D echo -Adequate ejection fraction; hepatitis panel negative. The goal of treatment is curative.   Discussed the potential side effects including but not limited to-increasing fatigue, nausea vomiting, diarrhea, hair loss, sores in the mouth, increase risk of infection and also neuropathy.   Growth factor-Neupogen SQ x 5 days would be given as prophylaxis for chemotherapy-induced neutropenia to prevent febrile neutropenias.   # Ongoing tumor lysis- spontaneous; discussed with Dr. Candiss Norse nephrology; planned start prophylactic bicarbonate drip.  # Above plan of care was discussed with the patient and multiple family members in detail. Given the statin concerns; we will plan CHOP chemotherapy today; Rituxan tomorrow. Discussed with pharmacist.; Floor RN  # 40 minutes face-to-face with the patient discussing the above plan of care; more than 50% of time spent on prognosis/ natural history; counseling and coordination.   Cammie Sickle, MD 01/23/2016  7:07 PM

## 2016-01-24 DIAGNOSIS — N183 Chronic kidney disease, stage 3 unspecified: Secondary | ICD-10-CM

## 2016-01-24 DIAGNOSIS — Z515 Encounter for palliative care: Secondary | ICD-10-CM

## 2016-01-24 DIAGNOSIS — Z7189 Other specified counseling: Secondary | ICD-10-CM

## 2016-01-24 LAB — CBC
HEMATOCRIT: 35.4 % — AB (ref 40.0–52.0)
Hemoglobin: 11.8 g/dL — ABNORMAL LOW (ref 13.0–18.0)
MCH: 26.3 pg (ref 26.0–34.0)
MCHC: 33.3 g/dL (ref 32.0–36.0)
MCV: 79.2 fL — AB (ref 80.0–100.0)
Platelets: 86 10*3/uL — ABNORMAL LOW (ref 150–440)
RBC: 4.47 MIL/uL (ref 4.40–5.90)
RDW: 20.1 % — ABNORMAL HIGH (ref 11.5–14.5)
WBC: 2.6 10*3/uL — AB (ref 3.8–10.6)

## 2016-01-24 LAB — RENAL FUNCTION PANEL
Albumin: 2.2 g/dL — ABNORMAL LOW (ref 3.5–5.0)
Anion gap: 7 (ref 5–15)
BUN: 27 mg/dL — ABNORMAL HIGH (ref 6–20)
CHLORIDE: 103 mmol/L (ref 101–111)
CO2: 23 mmol/L (ref 22–32)
Calcium: 8 mg/dL — ABNORMAL LOW (ref 8.9–10.3)
Creatinine, Ser: 1.61 mg/dL — ABNORMAL HIGH (ref 0.61–1.24)
GFR, EST AFRICAN AMERICAN: 44 mL/min — AB (ref >60)
GFR, EST NON AFRICAN AMERICAN: 38 mL/min — AB (ref >60)
Glucose, Bld: 232 mg/dL — ABNORMAL HIGH (ref 65–99)
POTASSIUM: 5 mmol/L (ref 3.5–5.1)
Phosphorus: 5.6 mg/dL — ABNORMAL HIGH (ref 2.5–4.6)
Sodium: 133 mmol/L — ABNORMAL LOW (ref 135–145)

## 2016-01-24 LAB — GLUCOSE, CAPILLARY
GLUCOSE-CAPILLARY: 332 mg/dL — AB (ref 65–99)
GLUCOSE-CAPILLARY: 326 mg/dL — AB (ref 65–99)
Glucose-Capillary: 221 mg/dL — ABNORMAL HIGH (ref 65–99)
Glucose-Capillary: 218 mg/dL — ABNORMAL HIGH (ref 65–99)

## 2016-01-24 MED ORDER — PANTOPRAZOLE SODIUM 40 MG PO TBEC
40.0000 mg | DELAYED_RELEASE_TABLET | Freq: Every day | ORAL | Status: DC
Start: 2016-01-24 — End: 2016-01-28
  Administered 2016-01-24 – 2016-01-28 (×5): 40 mg via ORAL
  Filled 2016-01-24 (×5): qty 1

## 2016-01-24 MED ORDER — TBO-FILGRASTIM 480 MCG/0.8ML ~~LOC~~ SOSY
480.0000 ug | PREFILLED_SYRINGE | Freq: Every day | SUBCUTANEOUS | Status: DC
  Administered 2016-01-24 – 2016-01-27 (×4): 480 ug via SUBCUTANEOUS
  Filled 2016-01-24 (×6): qty 0.8

## 2016-01-24 MED ORDER — DIPHENHYDRAMINE HCL 25 MG PO CAPS
ORAL_CAPSULE | ORAL | Status: AC
  Administered 2016-01-24: 11:00:00
  Filled 2016-01-24: qty 1

## 2016-01-24 NOTE — Progress Notes (Signed)
Patient received rituxan without complications. VSS. Patient up to chair, family at bedside.

## 2016-01-24 NOTE — Progress Notes (Signed)
[facility]  ROUNDING NOTE   Subjective:   Family at bedside.  States he is feeling better toda Chemo started. Also getting iv bicarb Serum creatinine 1.61/GFR 38  Objective:  Vital signs in last 24 hours:  Temp:  [97.7 F (36.5 C)-98.4 F (36.9 C)] 97.7 F (36.5 C) (11/22 2106) Pulse Rate:  [76-96] 76 (11/23 0704) Resp:  [16-18] 16 (11/23 0704) BP: (100-143)/(44-57) 100/44 (11/23 0704) SpO2:  [91 %-96 %] 95 % (11/23 0704)  Weight change:  Filed Weights   01/15/16 1430 01/15/16 1533 01/15/16 1613  Weight: 92.3 kg (203 lb 8 oz) 92.3 kg (203 lb 8 oz) 92.3 kg (203 lb 8 oz)    Intake/Output: I/O last 3 completed shifts: In: 1584.9 [P.O.:720; I.V.:663; IV Piggyback:201.9] Out: 225 [Urine:225]   Intake/Output this shift:  Total I/O In: -  Out: 100 [Urine:100]  Physical Exam: General: NAD,   Head: Normocephalic, atraumatic. Moist oral mucosal membranes  Eyes: Anicteric,   Neck: Supple, trachea midline  Lungs:  Clear to auscultation  Heart: Regular rate and rhythm  Abdomen:  Soft, nontender,   Extremities: trace lower extremity edema  Neurologic: Nonfocal, moving all four extremities  Skin: Warm dry. Some redness around port        Basic Metabolic Panel:  Recent Labs Lab 01/18/16 0346 01/19/16 0318 01/20/16 0453 01/21/16 0447 01/23/16 0812 01/24/16 0529  NA 135 134* 133* 134*  --  133*  K 4.5 4.6 5.0  --   --  5.0  CL 108 110 106  --   --  103  CO2 19* 20* 18* 20*  --  23  GLUCOSE 69 104* 122*  --   --  232*  BUN 22* 19 18  --   --  27*  CREATININE 1.60*  1.55* 1.59* 1.60* 1.56* 1.61* 1.61*  CALCIUM 7.7* 7.6* 8.1*  --   --  8.0*  MG 1.8  --   --   --   --   --   PHOS 2.3*  --  2.7 3.2  --  5.6*    Liver Function Tests:  Recent Labs Lab 01/24/16 0529  ALBUMIN 2.2*   No results for input(s): LIPASE, AMYLASE in the last 168 hours. No results for input(s): AMMONIA in the last 168 hours.  CBC:  Recent Labs Lab 01/20/16 0453  01/21/16 0447 01/22/16 1013 01/23/16 0812 01/24/16 0529  WBC 3.8 3.2* 3.6* 3.2* 2.6*  NEUTROABS  --  2.2  --   --   --   HGB 13.7 12.4* 13.0 12.3* 11.8*  HCT 41.4 37.2* 39.3* 37.5* 35.4*  MCV 79.7* 77.8* 79.5* 78.3* 79.2*  PLT 106* 84* 93* 84* 86*    Cardiac Enzymes: No results for input(s): CKTOTAL, CKMB, CKMBINDEX, TROPONINI in the last 168 hours.  BNP: Invalid input(s): POCBNP  CBG:  Recent Labs Lab 01/23/16 0731 01/23/16 1149 01/23/16 1651 01/23/16 2148 01/24/16 0729  GLUCAP 95 120* 113* 164* 221*    Microbiology: Results for orders placed or performed during the hospital encounter of 01/15/16  C difficile quick scan w PCR reflex     Status: None   Collection Time: 01/20/16  8:05 AM  Result Value Ref Range Status   C Diff antigen NEGATIVE NEGATIVE Final   C Diff toxin NEGATIVE NEGATIVE Final   C Diff interpretation No C. difficile detected.  Final  Gastrointestinal Panel by PCR , Stool     Status: None   Collection Time: 01/20/16  8:05 AM  Result Value Ref Range Status   Campylobacter species NOT DETECTED NOT DETECTED Final   Plesimonas shigelloides NOT DETECTED NOT DETECTED Final   Salmonella species NOT DETECTED NOT DETECTED Final   Yersinia enterocolitica NOT DETECTED NOT DETECTED Final   Vibrio species NOT DETECTED NOT DETECTED Final   Vibrio cholerae NOT DETECTED NOT DETECTED Final   Enteroaggregative E coli (EAEC) NOT DETECTED NOT DETECTED Final   Enteropathogenic E coli (EPEC) NOT DETECTED NOT DETECTED Final   Enterotoxigenic E coli (ETEC) NOT DETECTED NOT DETECTED Final   Shiga like toxin producing E coli (STEC) NOT DETECTED NOT DETECTED Final   Shigella/Enteroinvasive E coli (EIEC) NOT DETECTED NOT DETECTED Final   Cryptosporidium NOT DETECTED NOT DETECTED Final   Cyclospora cayetanensis NOT DETECTED NOT DETECTED Final   Entamoeba histolytica NOT DETECTED NOT DETECTED Final   Giardia lamblia NOT DETECTED NOT DETECTED Final   Adenovirus F40/41 NOT  DETECTED NOT DETECTED Final   Astrovirus NOT DETECTED NOT DETECTED Final   Norovirus GI/GII NOT DETECTED NOT DETECTED Final   Rotavirus A NOT DETECTED NOT DETECTED Final   Sapovirus (I, II, IV, and V) NOT DETECTED NOT DETECTED Final    Coagulation Studies: No results for input(s): LABPROT, INR in the last 72 hours.  Urinalysis: No results for input(s): COLORURINE, LABSPEC, PHURINE, GLUCOSEU, HGBUR, BILIRUBINUR, KETONESUR, PROTEINUR, UROBILINOGEN, NITRITE, LEUKOCYTESUR in the last 72 hours.  Invalid input(s): APPERANCEUR    Imaging: No results found.   Medications:   .  sodium bicarbonate  infusion 1000 mL 75 mL/hr at 01/23/16 1933   . diphenhydrAMINE      . allopurinol  300 mg Oral Daily  . carvedilol  6.25 mg Oral BID WC  . dronabinol  2.5 mg Oral BID AC  . enoxaparin (LOVENOX) injection  90 mg Subcutaneous Q12H  . gentamicin irrigation   Irrigation Once  . insulin aspart  0-9 Units Subcutaneous TID WC  . insulin glargine  5 Units Subcutaneous QHS  . ketoconazole   Topical Daily  . pneumococcal 23 valent vaccine  0.5 mL Intramuscular Tomorrow-1000  . predniSONE  50 mg Oral BID WC  . riTUXimab (RITUXAN) IV infusion  375 mg/m2 (Treatment Plan Recorded) Intravenous Once   acetaminophen **OR** acetaminophen, ALPRAZolam, HYDROcodone-acetaminophen, ondansetron **OR** ondansetron (ZOFRAN) IV  Assessment/ Plan:  Mr. ASHTON BELOTE is a 82 y.o. white male with new onset lymphoma, hypertension, diabetes mellitus type II, hyperlipidemia, coronary artery disease, DVT on xarelto, BPH, RBB, who was admitted to [facility] on 01/15/2016 for tumor lysis syndrome renal insufficiency hyperuremia   1. Acute renal failure on chronic kidney disease stage III: with hyperkalemia and hyponatremia: baseline creatinine of 1.6, eGFR of 38 from 12/31/15.  Admission creatinine 2.34 CT angiogram on 11/6. Poor PO intake -continue supportive care Today's Cr 1.6/GFR 38  2. Tumor lysis syndrome with  hyperuremia: G6PD negative.  - encouraged oral fluid intake - iv bicarb  3. Lymphoma and CLL - chemotherapy as per Oncology team     LOS: 9 Pranathi Winfree 11/23/20179:47 AM

## 2016-01-24 NOTE — Progress Notes (Signed)
[employer] [facility] [place] NAME: Hayden Martin    MR#:  [id number]  DATE OF BIRTH:  August 13, 1931  SUBJECTIVE:   S/p Chemo yesterday.  Feels a bit weak but otherwise doing well.  No N/V.  Family at bedside.    REVIEW OF SYSTEMS:    Review of Systems  Constitutional: Negative for chills and fever.  HENT: Negative for congestion and tinnitus.   Eyes: Negative for blurred vision and double vision.  Respiratory: Negative for cough, shortness of breath and wheezing.   Cardiovascular: Negative for chest pain, orthopnea and PND.  Gastrointestinal: Negative for abdominal pain, diarrhea, nausea and vomiting.  Genitourinary: Negative for dysuria and hematuria.  Neurological: Positive for weakness. Negative for dizziness, sensory change and focal weakness.  All other systems reviewed and are negative.   Nutrition: Carb control Tolerating Diet: Yes but poor. Tolerating PT: Eval noted    DRUG ALLERGIES:   Allergies  Allergen Reactions  . Codeine Itching  . Penicillins Itching    VITALS:  Blood pressure (!) 105/52, pulse 77, temperature 97.5 F (36.4 C), temperature source Oral, resp. rate 18, height 5' 11"  (1.803 m), weight 92.3 kg (203 lb 8 oz), SpO2 92 %.  PHYSICAL EXAMINATION:   Physical Exam  GENERAL:  82 y.o.-year-old patient lying in the bed in no acute distress.  EYES: Pupils equal, round, reactive to light and accommodation. No scleral icterus. Extraocular muscles intact.  HEENT: Head atraumatic, normocephalic. Oropharynx and nasopharynx clear.  NECK:  Supple, no jugular venous distention. No thyroid enlargement, no tenderness.  LUNGS: Normal breath sounds bilaterally, no wheezing, rales, rhonchi. No use of accessory muscles of respiration.  CARDIOVASCULAR: S1, S2 normal. No murmurs, rubs, or gallops.  ABDOMEN: Soft, nontender, nondistended. Bowel sounds present. No organomegaly or mass.  EXTREMITIES: No cyanosis, clubbing, +1-2 edema  on LLE > Right.  NEUROLOGIC: Cranial nerves II through XII are intact. No focal Motor or sensory deficits b/l. Globally weak  PSYCHIATRIC: The patient is alert and oriented x 3.  SKIN: No obvious rash, lesion, or ulcer.    LABORATORY PANEL:   CBC  Recent Labs Lab 01/24/16 0529  WBC 2.6*  HGB 11.8*  HCT 35.4*  PLT 86*   ------------------------------------------------------------------------------------------------------------------  Chemistries   Recent Labs Lab 01/18/16 0346  01/24/16 0529  NA 135  < > 133*  K 4.5  < > 5.0  CL 108  < > 103  CO2 19*  < > 23  GLUCOSE 69  < > 232*  BUN 22*  < > 27*  CREATININE 1.60*  1.55*  < > 1.61*  CALCIUM 7.7*  < > 8.0*  MG 1.8  --   --   < > = values in this interval not displayed. ------------------------------------------------------------------------------------------------------------------  Cardiac Enzymes No results for input(s): TROPONINI in the last 168 hours. ------------------------------------------------------------------------------------------------------------------  RADIOLOGY:  No results found.   ASSESSMENT AND PLAN:   82 year old male with past medical history of diabetes, hypertension, previous history of DVT, suspected lymphoma, admitted to the hospital due to acute renal failure secondary to hyperuricemia and tumor lysis syndrome.  1. Acute on chronic renal failure-secondary to tumor lysis syndrome. - Cr. Stable and close to baseline.  Cont. Bicarb.  - appreciate Nephro input.   2. Metabolic Acidosis - due to ARF and improved w/ Bicarb and as renal function has improved.   3. Hyperkalemia - due to AKI and now resolved.   4. Hyperuricemia - due to Tumor  lysis syndrome from suspected Lymphoma.  - resolved. Cont. Allopurinol.   5. Suspected Lymphoma - PET Scan as outpatient + for lymph nodes in the neck chest abdomen pelvis and also splenomegaly. Patient is status post bone marrow biopsy and  Port-A-Cath placement. - appreciate Oncology help and pt. Started on chemo yesterday.  Tolerating it well and will monitor.   6. History of DVT-patient is status post IVC filter placement. - cont. Lovenox.   7. Anemia/thrombocytopenia-secondary to the suspected lymphoma.  - counts stable and will monitor.   8. Diabetes type 2 without complication-continue sliding scale insulin, Lantus. - BS stable.   9. HTN - cont. Coreg.   PT eval noted and pt. Will need SNF upon discharged and social work aware.   All the records are reviewed and case discussed with Care Management/Social Worker. Management plans discussed with the patient, family and they are in agreement.  CODE STATUS: Full code  DVT Prophylaxis: Lovenox.  TOTAL TIME TAKING CARE OF THIS PATIENT: 25 minutes.   POSSIBLE D/C IN 2-3 DAYS, DEPENDING ON CLINICAL CONDITION.   Henreitta Leber M.D on 01/24/2016 at 12:38 PM  Between 7am to 6pm - Pager - [phone]  After 6pm go to [email or web address] - Proofreader  [facility]  Office  [phone]  CC: Primary care physician; Kirk Ruths., MD

## 2016-01-24 NOTE — Progress Notes (Signed)
Hayden Martin   DOB:Dec 24, 1931   [id number]    Subjective: Patient resting comfortably in the bed. Accompanied by multiple family members. Admits to better appetite. Patient had CHOP chemotherapy yesterday.  No nausea no vomiting. Patient getting Rituxan today. Tolerating well so far  Objective:  Vitals:   01/24/16 1237 01/24/16 1305  BP: (!) 107/48 (!) 110/40  Pulse: 75 77  Resp: 20 18  Temp:       Intake/Output Summary (Last 24 hours) at 01/24/16 1312 Last data filed at 01/24/16 1118  Gross per 24 hour  Intake           1344.9 ml  Output              425 ml  Net            919.9 ml    GENERAL Alert, no distress and comfortable.  EYES: no pallor or icterusAccompanied by multiple family members. OROPHARYNX: no thrush or ulceration. NECK: supple, no masses felt LYMPH:  no palpable lymphadenopathy in the cervical, axillary or inguinal regions LUNGS: decreased breath sounds to auscultation at bases and  No wheeze or crackles HEART/CVS: regular rate & rhythm and no murmurs; No lower extremity edema ABDOMEN: abdomen soft, tender  on deep palpation. and normal bowel sounds positive for enlarged spleen. Musculoskeletal:no cyanosis of digits and no clubbing  PSYCH: alert & oriented x 3 with fluent speech NEURO: no focal motor/sensory deficits SKIN:  Multiple bruises more so around the right chest wall Mediport.   Labs:  Lab Results  Component Value Date   WBC 2.6 (L) 01/24/2016   HGB 11.8 (L) 01/24/2016   HCT 35.4 (L) 01/24/2016   MCV 79.2 (L) 01/24/2016   PLT 86 (L) 01/24/2016   NEUTROABS 2.2 01/21/2016    Lab Results  Component Value Date   NA 133 (L) 01/24/2016   K 5.0 01/24/2016   CL 103 01/24/2016   CO2 23 01/24/2016    Studies:  No results found.  Assessment & Plan:   # 82 year old male patient currently admitted to the hospital/with tumor lysis syndrome/ with newly diagnosed diffuse large B-cell lymphoma  # Diffuse large B-cell lymphoma/CD20 positive  CD30 positive- Patient's status post mini-CHOP on November 22; continue Rituxan today. Tolerated chemotherapy very well so far. Plan starting Neupogen today.  # Growth factor-Neupogen SQ x 5 days would be given as prophylaxis for chemotherapy-induced neutropenia to prevent febrile neutropenias.   # Ongoing tumor lysis- spontaneous; discussed with Dr. Candiss Norse nephrology; on prophylactic bicarbonate drip.  # Above plan discussed with the nurse/family and also Dr. Verdell Carmine.   Cammie Sickle, MD 01/24/2016  1:12 PM

## 2016-01-25 LAB — GLUCOSE, CAPILLARY
GLUCOSE-CAPILLARY: 416 mg/dL — AB (ref 65–99)
Glucose-Capillary: 333 mg/dL — ABNORMAL HIGH (ref 65–99)
Glucose-Capillary: 396 mg/dL — ABNORMAL HIGH (ref 65–99)
Glucose-Capillary: 402 mg/dL — ABNORMAL HIGH (ref 65–99)
Glucose-Capillary: 437 mg/dL — ABNORMAL HIGH (ref 65–99)

## 2016-01-25 LAB — RENAL FUNCTION PANEL
Albumin: 2.2 g/dL — ABNORMAL LOW (ref 3.5–5.0)
Anion gap: 7 (ref 5–15)
BUN: 39 mg/dL — AB (ref 6–20)
CHLORIDE: 101 mmol/L (ref 101–111)
CO2: 27 mmol/L (ref 22–32)
CREATININE: 1.76 mg/dL — AB (ref 0.61–1.24)
Calcium: 7.7 mg/dL — ABNORMAL LOW (ref 8.9–10.3)
GFR calc Af Amer: 40 mL/min — ABNORMAL LOW (ref >60)
GFR, EST NON AFRICAN AMERICAN: 34 mL/min — AB (ref >60)
GLUCOSE: 379 mg/dL — AB (ref 65–99)
Phosphorus: 5.4 mg/dL — ABNORMAL HIGH (ref 2.5–4.6)
Potassium: 4.7 mmol/L (ref 3.5–5.1)
Sodium: 135 mmol/L (ref 135–145)

## 2016-01-25 LAB — CBC
HCT: 34 % — ABNORMAL LOW (ref 40.0–52.0)
Hemoglobin: 11.5 g/dL — ABNORMAL LOW (ref 13.0–18.0)
MCH: 26.8 pg (ref 26.0–34.0)
MCHC: 33.7 g/dL (ref 32.0–36.0)
MCV: 79.4 fL — AB (ref 80.0–100.0)
PLATELETS: 98 10*3/uL — AB (ref 150–440)
RBC: 4.28 MIL/uL — ABNORMAL LOW (ref 4.40–5.90)
RDW: 20.6 % — AB (ref 11.5–14.5)
WBC: 7.9 10*3/uL (ref 3.8–10.6)

## 2016-01-25 LAB — GLUCOSE, RANDOM: GLUCOSE: 431 mg/dL — AB (ref 65–99)

## 2016-01-25 MED ORDER — INSULIN ASPART 100 UNIT/ML ~~LOC~~ SOLN
12.0000 [IU] | Freq: Once | SUBCUTANEOUS | Status: AC
  Administered 2016-01-25: 12 [IU] via SUBCUTANEOUS
  Filled 2016-01-25: qty 12

## 2016-01-25 MED ORDER — INSULIN ASPART 100 UNIT/ML ~~LOC~~ SOLN
5.0000 [IU] | Freq: Three times a day (TID) | SUBCUTANEOUS | Status: DC
  Administered 2016-01-25 – 2016-01-27 (×5): 5 [IU] via SUBCUTANEOUS
  Filled 2016-01-25 (×5): qty 5

## 2016-01-25 MED ORDER — INSULIN ASPART 100 UNIT/ML ~~LOC~~ SOLN
0.0000 [IU] | Freq: Every day | SUBCUTANEOUS | Status: DC
  Administered 2016-01-25: 22:00:00 6 [IU] via SUBCUTANEOUS
  Administered 2016-01-26 – 2016-01-27 (×2): 3 [IU] via SUBCUTANEOUS
  Filled 2016-01-25: qty 3

## 2016-01-25 MED ORDER — INSULIN ASPART 100 UNIT/ML ~~LOC~~ SOLN
0.0000 [IU] | Freq: Three times a day (TID) | SUBCUTANEOUS | Status: DC
  Administered 2016-01-26 (×3): 5 [IU] via SUBCUTANEOUS
  Administered 2016-01-27: 7 [IU] via SUBCUTANEOUS
  Administered 2016-01-27 (×2): 5 [IU] via SUBCUTANEOUS
  Administered 2016-01-28 (×2): 3 [IU] via SUBCUTANEOUS
  Filled 2016-01-25: qty 3
  Filled 2016-01-25: qty 5
  Filled 2016-01-25: qty 7
  Filled 2016-01-25 (×2): qty 5
  Filled 2016-01-25: qty 3
  Filled 2016-01-25: qty 4
  Filled 2016-01-25 (×2): qty 5

## 2016-01-25 MED ORDER — INSULIN GLARGINE 100 UNIT/ML ~~LOC~~ SOLN
20.0000 [IU] | Freq: Every day | SUBCUTANEOUS | Status: DC
  Administered 2016-01-25 – 2016-01-26 (×2): 20 [IU] via SUBCUTANEOUS
  Filled 2016-01-25 (×3): qty 0.2

## 2016-01-25 NOTE — Progress Notes (Signed)
Inpatient Diabetes Program Recommendations  AACE/ADA: New Consensus Statement on Inpatient Glycemic Control (2015)  Target Ranges:  Prepandial:   less than 140 mg/dL      [facility] postprandial:   less than 180 mg/dL (1-2 hours)      Critically ill patients:  140 - 180 mg/dL   Lab Results  Component Value Date   GLUCAP 333 (H) 01/25/2016    Review of Glycemic Control  Results for RIGBY, LAQUE (MRN [id number]) as of 01/25/2016 11:02  Ref. Range 01/24/2016 07:29 01/24/2016 11:54 01/24/2016 17:03 01/24/2016 20:53 01/25/2016 07:36  Glucose-Capillary Latest Ref Range: 65 - 99 mg/dL 221 (H) 218 (H) 332 (H) 326 (H) 333 (H)    Diabetes history: Type 2 Outpatient Diabetes medications: Lantus 35 units qhs, Novolog 8 units tid Current orders for Inpatient glycemic control: Lantus 5 units qhs, Novolog sensitive correction tid  *prednisone 50mg  bid  Inpatient Diabetes Program Recommendations: Consider increasing Lantus to 20 units qhs, add Novolog 5 units tid and continue Novolog sensitive correction tid.  Add Novolog 0-5 units qhs.  Gentry Fitz, RN, BA, MHA, CDE Diabetes Coordinator Inpatient Diabetes Program  [phone] (Team Pager) [phone] ([place]) 01/25/2016 11:05 AM

## 2016-01-25 NOTE — Progress Notes (Signed)
[place] [facility] [place] NAME: Hayden Martin    MR#:  [id number]  DATE OF BIRTH:  [birth year]  SUBJECTIVE:   S/p Chemo and tolerated it well.  In good spirits.  Family at bedside.  No complaints.    REVIEW OF SYSTEMS:    Review of Systems  Constitutional: Negative for chills and fever.  HENT: Negative for congestion and tinnitus.   Eyes: Negative for blurred vision and double vision.  Respiratory: Negative for cough, shortness of breath and wheezing.   Cardiovascular: Negative for chest pain, orthopnea and PND.  Gastrointestinal: Negative for abdominal pain, diarrhea, nausea and vomiting.  Genitourinary: Negative for dysuria and hematuria.  Neurological: Positive for weakness. Negative for dizziness, sensory change and focal weakness.  All other systems reviewed and are negative.   Nutrition: Carb control Tolerating Diet: Yes and improved. Tolerating PT: Eval noted    DRUG ALLERGIES:   Allergies  Allergen Reactions  . Codeine Itching  . Penicillins Itching    VITALS:  Blood pressure (!) 106/43, pulse 70, temperature 97.6 F (36.4 C), temperature source Oral, resp. rate 18, height _0  (1.803 m), weight 92.3 kg (203 lb 8 oz), SpO2 93 %.  PHYSICAL EXAMINATION:   Physical Exam  GENERAL:  82 y.o.-year-old patient lying in the bed in no acute distress.  EYES: Pupils equal, round, reactive to light and accommodation. No scleral icterus. Extraocular muscles intact.  HEENT: Head atraumatic, normocephalic. Oropharynx and nasopharynx clear.  NECK:  Supple, no jugular venous distention. No thyroid enlargement, no tenderness.  LUNGS: Normal breath sounds bilaterally, no wheezing, rales, rhonchi. No use of accessory muscles of respiration.  CARDIOVASCULAR: S1, S2 normal. No murmurs, rubs, or gallops.  ABDOMEN: Soft, nontender, nondistended. Bowel sounds present. No organomegaly or mass.  EXTREMITIES: No cyanosis, clubbing, +1-2 edema on  LLE > Right.  NEUROLOGIC: Cranial nerves II through XII are intact. No focal Motor or sensory deficits b/l. Globally weak  PSYCHIATRIC: The patient is alert and oriented x 3.  SKIN: No obvious rash, lesion, or ulcer.    LABORATORY PANEL:   CBC  Recent Labs Lab 01/25/16 0446  WBC 7.9  HGB 11.5*  HCT 34.0*  PLT 98*   ------------------------------------------------------------------------------------------------------------------  Chemistries   Recent Labs Lab 01/25/16 0500  NA 135  K 4.7  CL 101  CO2 27  GLUCOSE 379*  BUN 39*  CREATININE 1.76*  CALCIUM 7.7*   ------------------------------------------------------------------------------------------------------------------  Cardiac Enzymes No results for input(s): TROPONINI in the last 168 hours. ------------------------------------------------------------------------------------------------------------------  RADIOLOGY:  No results found.   ASSESSMENT AND PLAN:   82 year old male with past medical history of diabetes, hypertension, previous history of DVT, suspected lymphoma, admitted to the hospital due to acute renal failure secondary to hyperuricemia and tumor lysis syndrome.  1. Acute on chronic renal failure-secondary to tumor lysis syndrome. - Cr. Stable. Nephro following. Cont. Fluids (Bicarb) during day but stop at night.   2. Metabolic Acidosis - due to ARF and improved w/ Bicarb and as renal function has improved.   3. Hyperkalemia - due to AKI and now resolved.   4. Hyperuricemia - due to Tumor lysis syndrome from suspected Lymphoma.  - resolved. Cont. Allopurinol.   5. Suspected Lymphoma - PET Scan as outpatient + for lymph nodes in the neck chest abdomen pelvis and also splenomegaly. Patient is status post bone marrow biopsy and Port-A-Cath placement. - appreciate Oncology help and s/p chemo. High risk for Tumor Lysis syndrome and  will monitor.  - counts and renal function stable.    6.  History of DVT-patient is status post IVC filter placement. - cont. Lovenox.   7. Anemia/thrombocytopenia-secondary to the suspected lymphoma.  - counts stable and will monitor.   8. Diabetes type 2 without complication- appreciate Diabetes Coordinator input.  - cont. Lantus, Novolog with meals, SSI coverage and will monitor.   9. HTN - cont. Coreg.   PT eval noted and pt. Will need SNF upon discharged and they have accepted [facility].  Likely d/c next week.   All the records are reviewed and case discussed with Care Management/Social Worker. Management plans discussed with the patient, family and they are in agreement.  CODE STATUS: Full code  DVT Prophylaxis: Lovenox.  TOTAL TIME TAKING CARE OF THIS PATIENT: 30 minutes.   POSSIBLE D/C IN 2-3 DAYS, DEPENDING ON CLINICAL CONDITION.   Henreitta Leber M.D on 01/25/2016 at 12:27 PM  Between 7am to 6pm - Pager - [phone]  After 6pm go to [email or web address] - Proofreader  [facility]  Office  [phone]  CC: Primary care physician; Kirk Ruths., MD

## 2016-01-25 NOTE — Progress Notes (Addendum)
Clinical Social Worker (CSW) met with patient and his daughter Lattie Haw to present bed offers. They chose [facility]. Tammy admissions coordinator at [facility] is aware of accepted bed offer and reported that patient can come to [facility] over the weekend if he is stable for D/C to room 106. CSW will continue to follow and assist as needed.   [employer], LCSW [phone]

## 2016-01-25 NOTE — Progress Notes (Signed)
18:30 Bicarbonate IV discontinued per Dr. Keturah Barre order.

## 2016-01-25 NOTE — Care Management Important Message (Signed)
Important Message  Patient Details  Name: Hayden Martin MRN: [id number] Date of Birth: August 30, 1931   Medicare Important Message Given:  Yes    Shelbie Ammons, RN 01/25/2016, 8:29 AM

## 2016-01-25 NOTE — Progress Notes (Signed)
17:45 - Patient spent day sitting up in chair. States he feels much improved today. VS stable - although SBP <110 all day. Patient eating and drinking well. Ambulated in hallway. Voiding without problems. FSBS elevated all day - MD adjusted Insulin regime today. No complaints of pain or discomfort noted. Dr. Candiss Norse ordered IV Bicarb to be discontinued at 18:30 tonight and he will evaluate and re-order in am if needed.

## 2016-01-25 NOTE — Progress Notes (Signed)
Physical Therapy Treatment Patient Details Name: Hayden Martin MRN: [id number] DOB: [birth year] Today's Date: 01/25/2016    History of Present Illness [name]  is a 82 y.o. male with a known history of  DVT, diabetes, hypertension who is referred from oncology office due to worsening renal failure, elevated LDH level, and elevated uric acid level who was seen by oncology first time in November 13. Patient previously was noted to have weight loss and abnormal blood work and abdominal CT revealed multiple solid masses within the spleen as well as extensive lymphadenopathy. Patient was planned to have a PET scan and a biopsy of this. Went for follow-up and had repeated blood work which showed he has elevated creatinine and his uric acid level is high and LDH is high as well. Therefore he is admitted for further evaluation and therapy. Patient complains of feeling very weak and tired and has not been able to walk.  Pt found to have L LE DVT 01/16/16 and s/p IVC filter placement 01/18/16.  Pt s/p port-a-cath and CT-guided bone marrow biopsy 01/21/16.  Pt now started chemotherapy.    PT Comments    Pt able to progress to ambulating 100 feet with RW; mildly unsteady with gait.  HR increased from 82-107 bpm with ambulation.  Pt requiring cueing for pacing and energy conservation during session.  Will continue to progress pt with functional mobility per pt tolerance.   Follow Up Recommendations  SNF (pending pt progress)     Equipment Recommendations  Rolling walker with 5" wheels    Recommendations for Other Services       Precautions / Restrictions Precautions Precautions: Fall Precaution Comments: chemo precautions Restrictions Weight Bearing Restrictions: No    Mobility  Bed Mobility               General bed mobility comments: deferred d/t pt sitting up in chair beginning and end of session  Transfers Overall transfer level: Needs assistance Equipment used: Rolling  walker (2 wheeled) Transfers: Sit to/from Stand Sit to Stand: Min guard         General transfer comment: mild increased effort to stand  Ambulation/Gait Ambulation/Gait assistance: Min guard;Min assist Ambulation Distance (Feet): 100 Feet Assistive device: Rolling walker (2 wheeled) Gait Pattern/deviations: Step-through pattern Gait velocity: decreased       Stairs            Wheelchair Mobility    Modified Rankin (Stroke Patients Only)       Balance Overall balance assessment: Needs assistance Sitting-balance support: Bilateral upper extremity supported;Feet supported Sitting balance-Leahy Scale: Good     Standing balance support: Bilateral upper extremity supported;During functional activity Standing balance-Leahy Scale: Fair Standing balance comment: B UE support on RW with standing ex's                    Cognition Arousal/Alertness: Awake/alert Behavior During Therapy: WFL for tasks assessed/performed Overall Cognitive Status: Within Functional Limits for tasks assessed                      Exercises Total Joint Exercises Hip ABduction/ADduction: AROM;Strengthening;Both;10 reps;Standing Marching in Standing: AROM;Strengthening;Both;10 reps;Standing Standing Hip Extension: AROM;Strengthening;Both;10 reps;Standing General Exercises - Lower Extremity Mini-Sqauts: AROM;Strengthening;Both;10 reps;Standing  B UE support on RW for above ex's; vc's and demo required for correct technique.    General Comments General comments (skin integrity, edema, etc.): Pt sitting up in chair with family present.  Pt agreeable to PT session.  Pertinent Vitals/Pain Pain Assessment: No/denies pain     Home Living                      Prior Function            PT Goals (current goals can now be found in the care plan section) Acute Rehab PT Goals Patient Stated Goal: Return to prior level of function at home PT Goal Formulation: With  patient/family Time For Goal Achievement: 01/30/16 Potential to Achieve Goals: Fair Progress towards PT goals: Progressing toward goals    Frequency    Min 2X/week      PT Plan Current plan remains appropriate    Co-evaluation             End of Session Equipment Utilized During Treatment: Gait belt Activity Tolerance: Patient tolerated treatment well Patient left: in chair;with call bell/phone within reach;with chair alarm set;with family/visitor present     Time: 7482-7078 PT Time Calculation (min) (ACUTE ONLY): 23 min  Charges:  $Therapeutic Exercise: 8-22 mins $Therapeutic Activity: 8-22 mins                    G CodesLeitha Bleak 2016-02-22, 4:57 PM Leitha Bleak, [place]

## 2016-01-25 NOTE — Progress Notes (Signed)
ANTICOAGULATION CONSULT NOTE - Follow up [facility] for enoxaparin  Indication: DVT  Allergies  Allergen Reactions  . Codeine Itching  . Penicillins Itching    Patient Measurements: Height: 5\' 11"  (180.3 cm) Weight: 203 lb 8 oz (92.3 kg) IBW/kg (Calculated) : 75.3 Heparin Dosing Weight: 92 kg  Vital Signs: Temp: 97.6 F (36.4 C) (11/24 0516) Temp Source: Oral (11/24 0516) BP: 110/46 (11/24 0657) Pulse Rate: 79 (11/24 0657)  Labs:  Recent Labs  01/22/16 1013 01/22/16 1803 01/23/16 0812 01/24/16 0529 01/25/16 0446 01/25/16 0500  HGB 13.0  --  12.3* 11.8* 11.5*  --   HCT 39.3*  --  37.5* 35.4* 34.0*  --   PLT 93*  --  84* 86* 98*  --   HEPARINUNFRC 0.22* 0.43  --   --   --   --   CREATININE  --   --  1.61* 1.61*  --  1.76*    Estimated Creatinine Clearance: 37.6 mL/min (by C-G formula based on SCr of 1.76 mg/dL (H)).   Medical History: Past Medical History:  Diagnosis Date  . Clotting disorder ([place])   . Diabetes mellitus without complication ([place])   . Heart disease   . Hypertension    Medications:  Scheduled:  . allopurinol  300 mg Oral Daily  . carvedilol  6.25 mg Oral BID WC  . dronabinol  2.5 mg Oral BID AC  . enoxaparin (LOVENOX) injection  90 mg Subcutaneous Q12H  . gentamicin irrigation   Irrigation Once  . insulin aspart  0-9 Units Subcutaneous TID WC  . insulin glargine  5 Units Subcutaneous QHS  . ketoconazole   Topical Daily  . pantoprazole  40 mg Oral Daily  . pneumococcal 23 valent vaccine  0.5 mL Intramuscular Tomorrow-1000  . predniSONE  50 mg Oral BID WC  . Tbo-Filgrastim  480 mcg Subcutaneous Q2000   Assessment: Pharmacy consulted to dose and monitor heparin drip in this 82 year old male admitted for tumor lysis syndrome and diagnosed with an acute lower extremity DVT.   Patient was taking rivaroxaban prior to admission for a history of DVT and was converted to apixaban on admission due to ARF and CrCl < 30 mL/min.  Patient has received 2 doses of apixaban during admission - last dose at 2206 on 11/15.   Treatment team has decided that the patient should be treated with enoxaparin treatment dose outpatient.    Plan:  Continue   Enoxaparin 1mg /kg (90mg ) every 12 hours to start. Continue to monitor Scr and CBC while inpatient.    Pernell Dupre, PharmD  Clinical Pharmacist 01/25/2016,7:50 AM

## 2016-01-25 NOTE — Progress Notes (Signed)
[facility]  ROUNDING NOTE   Subjective:   Family at bedside.  States he is feeling better today. States he ate more than he has in the last 7 days Chemo started. Also getting iv bicarb- nurse reports he is tired of beeping at night and his family reports excessive voiding at night Serum creatinine slightly higher at 1.76  Objective:  Vital signs in last 24 hours:  Temp:  [97.5 F (36.4 C)-97.8 F (36.6 C)] 97.6 F (36.4 C) (11/24 0516) Pulse Rate:  [70-86] 70 (11/24 0823) Resp:  [18-20] 18 (11/24 0516) BP: (96-118)/(38-55) 106/43 (11/24 0823) SpO2:  [90 %-98 %] 93 % (11/24 0657)  Weight change:  Filed Weights   01/15/16 1430 01/15/16 1533 01/15/16 1613  Weight: 92.3 kg (203 lb 8 oz) 92.3 kg (203 lb 8 oz) 92.3 kg (203 lb 8 oz)    Intake/Output: I/O last 3 completed shifts: In: 2469.3 [P.O.:480; I.V.:1989.3] Out: 625 [Urine:625]   Intake/Output this shift:  Total I/O In: 240 [P.O.:240] Out: -   Physical Exam: General: NAD,   Head: Decreased hearing Moist oral mucosal membranes  Eyes: Anicteric,   Neck: Supple, trachea midline  Lungs:  Clear to auscultation  Heart: No rubs or gallops  Abdomen:  Soft, nontender,   Extremities: trace lower extremity edema  Neurologic: Nonfocal, moving all four extremities  Skin: Warm dry. Some redness around port        Basic Metabolic Panel:  Recent Labs Lab 01/19/16 0318 01/20/16 0453 01/21/16 0447 01/23/16 0812 01/24/16 0529 01/25/16 0500  NA 134* 133* 134*  --  133* 135  K 4.6 5.0  --   --  5.0 4.7  CL 110 106  --   --  103 101  CO2 20* 18* 20*  --  23 27  GLUCOSE 104* 122*  --   --  232* 379*  BUN 19 18  --   --  27* 39*  CREATININE 1.59* 1.60* 1.56* 1.61* 1.61* 1.76*  CALCIUM 7.6* 8.1*  --   --  8.0* 7.7*  PHOS  --  2.7 3.2  --  5.6* 5.4*    Liver Function Tests:  Recent Labs Lab 01/24/16 0529 01/25/16 0500  ALBUMIN 2.2* 2.2*   No results for input(s): LIPASE, AMYLASE in the last 168  hours. No results for input(s): AMMONIA in the last 168 hours.  CBC:  Recent Labs Lab 01/21/16 0447 01/22/16 1013 01/23/16 0812 01/24/16 0529 01/25/16 0446  WBC 3.2* 3.6* 3.2* 2.6* 7.9  NEUTROABS 2.2  --   --   --   --   HGB 12.4* 13.0 12.3* 11.8* 11.5*  HCT 37.2* 39.3* 37.5* 35.4* 34.0*  MCV 77.8* 79.5* 78.3* 79.2* 79.4*  PLT 84* 93* 84* 86* 98*    Cardiac Enzymes: No results for input(s): CKTOTAL, CKMB, CKMBINDEX, TROPONINI in the last 168 hours.  BNP: Invalid input(s): POCBNP  CBG:  Recent Labs Lab 01/24/16 1154 01/24/16 1703 01/24/16 2053 01/25/16 0736 01/25/16 1158  GLUCAP 218* 332* 326* 333* 21*    Microbiology: Results for orders placed or performed during the hospital encounter of 01/15/16  C difficile quick scan w PCR reflex     Status: None   Collection Time: 01/20/16  8:05 AM  Result Value Ref Range Status   C Diff antigen NEGATIVE NEGATIVE Final   C Diff toxin NEGATIVE NEGATIVE Final   C Diff interpretation No C. difficile detected.  Final  Gastrointestinal Panel by PCR , Stool  Status: None   Collection Time: 01/20/16  8:05 AM  Result Value Ref Range Status   Campylobacter species NOT DETECTED NOT DETECTED Final   Plesimonas shigelloides NOT DETECTED NOT DETECTED Final   Salmonella species NOT DETECTED NOT DETECTED Final   Yersinia enterocolitica NOT DETECTED NOT DETECTED Final   Vibrio species NOT DETECTED NOT DETECTED Final   Vibrio cholerae NOT DETECTED NOT DETECTED Final   Enteroaggregative E coli (EAEC) NOT DETECTED NOT DETECTED Final   Enteropathogenic E coli (EPEC) NOT DETECTED NOT DETECTED Final   Enterotoxigenic E coli (ETEC) NOT DETECTED NOT DETECTED Final   Shiga like toxin producing E coli (STEC) NOT DETECTED NOT DETECTED Final   Shigella/Enteroinvasive E coli (EIEC) NOT DETECTED NOT DETECTED Final   Cryptosporidium NOT DETECTED NOT DETECTED Final   Cyclospora cayetanensis NOT DETECTED NOT DETECTED Final   Entamoeba  histolytica NOT DETECTED NOT DETECTED Final   Giardia lamblia NOT DETECTED NOT DETECTED Final   Adenovirus F40/41 NOT DETECTED NOT DETECTED Final   Astrovirus NOT DETECTED NOT DETECTED Final   Norovirus GI/GII NOT DETECTED NOT DETECTED Final   Rotavirus A NOT DETECTED NOT DETECTED Final   Sapovirus (I, II, IV, and V) NOT DETECTED NOT DETECTED Final    Coagulation Studies: No results for input(s): LABPROT, INR in the last 72 hours.  Urinalysis: No results for input(s): COLORURINE, LABSPEC, PHURINE, GLUCOSEU, HGBUR, BILIRUBINUR, KETONESUR, PROTEINUR, UROBILINOGEN, NITRITE, LEUKOCYTESUR in the last 72 hours.  Invalid input(s): APPERANCEUR    Imaging: No results found.   Medications:   .  sodium bicarbonate  infusion 1000 mL Stopped (01/24/16 2200)   . allopurinol  300 mg Oral Daily  . carvedilol  6.25 mg Oral BID WC  . dronabinol  2.5 mg Oral BID AC  . enoxaparin (LOVENOX) injection  90 mg Subcutaneous Q12H  . gentamicin irrigation   Irrigation Once  . insulin aspart  0-9 Units Subcutaneous TID WC  . insulin glargine  5 Units Subcutaneous QHS  . ketoconazole   Topical Daily  . pantoprazole  40 mg Oral Daily  . pneumococcal 23 valent vaccine  0.5 mL Intramuscular Tomorrow-1000  . predniSONE  50 mg Oral BID WC  . Tbo-Filgrastim  480 mcg Subcutaneous Q2000   acetaminophen **OR** acetaminophen, ALPRAZolam, HYDROcodone-acetaminophen, ondansetron **OR** ondansetron (ZOFRAN) IV  Assessment/ Plan:  [name] is a 82 y.o. white male with new onset lymphoma, hypertension, diabetes mellitus type II, hyperlipidemia, coronary artery disease, DVT on xarelto, BPH, RBB, who was admitted to [facility] on 01/15/2016 for tumor lysis syndrome renal insufficiency hyperuremia   1. Acute renal failure on chronic kidney disease stage III: with hyperkalemia and hyponatremia: baseline creatinine of 1.6, eGFR of 38 from 12/31/15.  Admission creatinine 2.34 CT angiogram on 11/6.  -continue  supportive care Today's Cr 1.76  2. 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  - encouraged oral fluid intake - iv fluids to be given during the day- to allow rest at night- discussed with nurse  3. Lymphoma and CLL - chemotherapy as per Oncology team     LOS: 10 Renuka Farfan 11/24/201712:00 PM

## 2016-01-26 DIAGNOSIS — E1165 Type 2 diabetes mellitus with hyperglycemia: Secondary | ICD-10-CM

## 2016-01-26 LAB — BASIC METABOLIC PANEL
ANION GAP: 7 (ref 5–15)
BUN: 42 mg/dL — ABNORMAL HIGH (ref 6–20)
CALCIUM: 7.6 mg/dL — AB (ref 8.9–10.3)
CO2: 30 mmol/L (ref 22–32)
CREATININE: 1.56 mg/dL — AB (ref 0.61–1.24)
Chloride: 101 mmol/L (ref 101–111)
GFR calc non Af Amer: 40 mL/min — ABNORMAL LOW (ref >60)
GFR, EST AFRICAN AMERICAN: 46 mL/min — AB (ref >60)
Glucose, Bld: 330 mg/dL — ABNORMAL HIGH (ref 65–99)
Potassium: 4.4 mmol/L (ref 3.5–5.1)
SODIUM: 138 mmol/L (ref 135–145)

## 2016-01-26 LAB — CBC
HCT: 35.1 % — ABNORMAL LOW (ref 40.0–52.0)
HEMOGLOBIN: 11.5 g/dL — AB (ref 13.0–18.0)
MCH: 26 pg (ref 26.0–34.0)
MCHC: 32.9 g/dL (ref 32.0–36.0)
MCV: 79.1 fL — ABNORMAL LOW (ref 80.0–100.0)
Platelets: 104 10*3/uL — ABNORMAL LOW (ref 150–440)
RBC: 4.44 MIL/uL (ref 4.40–5.90)
RDW: 20.5 % — AB (ref 11.5–14.5)
WBC: 13.2 10*3/uL — AB (ref 3.8–10.6)

## 2016-01-26 LAB — GLUCOSE, CAPILLARY
GLUCOSE-CAPILLARY: 299 mg/dL — AB (ref 65–99)
GLUCOSE-CAPILLARY: 277 mg/dL — AB (ref 65–99)
Glucose-Capillary: 268 mg/dL — ABNORMAL HIGH (ref 65–99)
Glucose-Capillary: 285 mg/dL — ABNORMAL HIGH (ref 65–99)

## 2016-01-26 NOTE — Progress Notes (Signed)
Hayden Martin   DOB:23-Sep-1931   [id number]    Subjective: Patient resting comfortably in the chair.  Accompanied by multiple family members. Admits to better appetite. Status post R [facility] chemotherapy. Patient working with physical therapy.  Objective:  Vitals:   01/26/16 1321 01/26/16 1700  BP: (!) 111/50 (!) 115/50  Pulse: 79 75  Resp:    Temp: 97.6 F (36.4 C)      Intake/Output Summary (Last 24 hours) at 01/26/16 1845 Last data filed at 01/26/16 1824  Gross per 24 hour  Intake              720 ml  Output              900 ml  Net             -180 ml    GENERAL Alert, no distress and comfortable.  EYES: no pallor or icterusAccompanied by multiple family members. OROPHARYNX: no thrush or ulceration. NECK: supple, no masses felt LYMPH:  no palpable lymphadenopathy in the cervical, axillary or inguinal regions LUNGS: decreased breath sounds to auscultation at bases and  No wheeze or crackles HEART/CVS: regular rate & rhythm and no murmurs; No lower extremity edema ABDOMEN: abdomen soft, tender  on deep palpation. and normal bowel sounds positive for enlarged spleen. Musculoskeletal:no cyanosis of digits and no clubbing  PSYCH: alert & oriented x 3 with fluent speech NEURO: no focal motor/sensory deficits SKIN:  Multiple bruises more so around the right chest wall Mediport.   Labs:  Lab Results  Component Value Date   WBC 13.2 (H) 01/26/2016   HGB 11.5 (L) 01/26/2016   HCT 35.1 (L) 01/26/2016   MCV 79.1 (L) 01/26/2016   PLT 104 (L) 01/26/2016   NEUTROABS 2.2 01/21/2016    Lab Results  Component Value Date   NA 138 01/26/2016   K 4.4 01/26/2016   CL 101 01/26/2016   CO2 30 01/26/2016    Studies:  No results found.  Assessment & Plan:   # 82 year old male patient currently admitted to the hospital/with tumor lysis syndrome/ with newly diagnosed diffuse large B-cell lymphoma  # Diffuse large B-cell lymphoma/CD20 positive CD30 positive- Patient's status  post mini-R [facility]OP on November 22 & 23rd; currently on Neupogen.  #  Spontaneous tumor lysis/high risk for tumor lysis- creatinine improving to 1.5 /hypocalcemia . repeat LDH uric acid; phosphorus magnesium. Nephrology closer following.  # Elevated blood sugars-/secondary to prednisone on insulin  # Disposition- skilled nursing facility/PT recommendation.  Cammie Sickle, MD 01/26/2016  6:45 PM

## 2016-01-26 NOTE — Progress Notes (Signed)
[facility] [facility] [place] NAME: Hayden Martin    MR#:  [id number]  DATE OF BIRTH:  1931/12/28  SUBJECTIVE:   S/p Chemo and tolerated it well. Renal function, WBC count, platelet count stable.  Had a small episode of epistaxis which has resolved now.  BS a bit labile but stable.   REVIEW OF SYSTEMS:    Review of Systems  Constitutional: Negative for chills and fever.  HENT: Negative for congestion and tinnitus.   Eyes: Negative for blurred vision and double vision.  Respiratory: Negative for cough, shortness of breath and wheezing.   Cardiovascular: Negative for chest pain, orthopnea and PND.  Gastrointestinal: Negative for abdominal pain, diarrhea, nausea and vomiting.  Genitourinary: Negative for dysuria and hematuria.  Neurological: Positive for weakness. Negative for dizziness, sensory change and focal weakness.  All other systems reviewed and are negative.   Nutrition: Carb control Tolerating Diet: Yes Tolerating PT: Eval noted    DRUG ALLERGIES:   Allergies  Allergen Reactions  . Codeine Itching  . Penicillins Itching    VITALS:  Blood pressure (!) 117/51, pulse 77, temperature 98 F (36.7 C), temperature source Oral, resp. rate 17, height _0  (1.803 m), weight 92.3 kg (203 lb 8 oz), SpO2 96 %.  PHYSICAL EXAMINATION:   Physical Exam  GENERAL:  82 y.o.-year-old patient lying in the bed in no acute distress.  EYES: Pupils equal, round, reactive to light and accommodation. No scleral icterus. Extraocular muscles intact.  HEENT: Head atraumatic, normocephalic. Oropharynx and nasopharynx clear.  NECK:  Supple, no jugular venous distention. No thyroid enlargement, no tenderness.  LUNGS: Normal breath sounds bilaterally, no wheezing, rales, rhonchi. No use of accessory muscles of respiration.  CARDIOVASCULAR: S1, S2 normal. II/VI SEM at LSB, No rubs, or gallops.  ABDOMEN: Soft, nontender, nondistended. Bowel sounds present. No  organomegaly or mass.  EXTREMITIES: No cyanosis, clubbing, +1-2 edema on LLE > Right.  NEUROLOGIC: Cranial nerves II through XII are intact. No focal Motor or sensory deficits b/l. Globally weak  PSYCHIATRIC: The patient is alert and oriented x 3.  SKIN: No obvious rash, lesion, or ulcer.    LABORATORY PANEL:   CBC  Recent Labs Lab 01/26/16 0613  WBC 13.2*  HGB 11.5*  HCT 35.1*  PLT 104*   ------------------------------------------------------------------------------------------------------------------  Chemistries   Recent Labs Lab 01/26/16 0613  NA 138  K 4.4  CL 101  CO2 30  GLUCOSE 330*  BUN 42*  CREATININE 1.56*  CALCIUM 7.6*   ------------------------------------------------------------------------------------------------------------------  Cardiac Enzymes No results for input(s): TROPONINI in the last 168 hours. ------------------------------------------------------------------------------------------------------------------  RADIOLOGY:  No results found.   ASSESSMENT AND PLAN:   82 year old male with past medical history of diabetes, hypertension, previous history of DVT, suspected lymphoma, admitted to the hospital due to acute renal failure secondary to hyperuricemia and tumor lysis syndrome.  1. Acute on chronic renal failure-secondary to tumor lysis syndrome. - Cr. Stable now at 1.5. Nephro following. Off bicarb now.    2. Metabolic Acidosis - due to ARF and improved w/ Bicarb and as renal function has improved.   3. Hyperkalemia - due to AKI and now resolved.   4. Hyperuricemia - due to Tumor lysis syndrome from Lymphoma.  - resolved. Cont. Allopurinol.   5. Suspected Lymphoma - PET Scan as outpatient + for lymph nodes in the neck chest abdomen pelvis and also splenomegaly. Patient is status post bone marrow biopsy and Port-A-Cath placement. - appreciate Oncology  help and s/p chemo. High risk for Tumor Lysis syndrome and will cont. To monitor.   - plt, wbc counts and renal function stable.    6. History of DVT-patient is status post IVC filter placement. - cont. Lovenox.   7. Anemia/thrombocytopenia-secondary to the suspected lymphoma.  - counts stable and will monitor.   8. Diabetes type 2 without complication- appreciate Diabetes Coordinator input. BS a bit labile yesterday due to diet.  - cont. Lantus, Novolog with meals, SSI coverage.   9. HTN - cont. Coreg.   Likely d/c to Rehab on Monday.   All the records are reviewed and case discussed with Care Management/Social Worker. Management plans discussed with the patient, family and they are in agreement.  CODE STATUS: Full code  DVT Prophylaxis: Lovenox.  TOTAL TIME TAKING CARE OF THIS PATIENT: 30 minutes.   POSSIBLE D/C IN 1-2 DAYS, DEPENDING ON CLINICAL CONDITION.   Henreitta Leber M.D on 01/26/2016 at 11:56 AM  Between 7am to 6pm - Pager - [phone]  After 6pm go to [email or web address] - Proofreader  [facility]  Office  [phone]  CC: Primary care physician; Kirk Ruths., MD

## 2016-01-26 NOTE — Progress Notes (Signed)
[facility]  ROUNDING NOTE   Subjective:   Feels well today Hayden Martin with PT for 30 min and was able to sit in the chair No SOB, no Leg edema No able to eat without nausea and vomiting  Objective:  Vital signs in last 24 hours:  Temp:  [97.5 F (36.4 C)-98 F (36.7 C)] 98 F (36.7 C) (11/25 0821) Pulse Rate:  [76-90] 77 (11/25 0821) Resp:  [17-20] 17 (11/25 0821) BP: (97-117)/(41-52) 117/51 (11/25 0821) SpO2:  [91 %-96 %] 96 % (11/25 0821)  Weight change:  Filed Weights   01/15/16 1430 01/15/16 1533 01/15/16 1613  Weight: 92.3 kg (203 lb 8 oz) 92.3 kg (203 lb 8 oz) 92.3 kg (203 lb 8 oz)    Intake/Output: I/O last 3 completed shifts: In: 582.5 [P.O.:240; I.V.:342.5] Out: 700 [Urine:700]   Intake/Output this shift:  Total I/O In: 240 [P.O.:240] Out: 400 [Urine:400]  Physical Exam: General: NAD,   Head: Decreased hearing Moist oral mucosal membranes  Eyes: Anicteric,   Neck: Supple, trachea midline  Lungs:  Clear to auscultation  Heart: No rubs or gallops  Abdomen:  Soft, nontender,   Extremities: trace lower extremity edema  Neurologic: Nonfocal, moving all four extremities  Skin: Warm dry. Some redness around port        Basic Metabolic Panel:  Recent Labs Lab 01/20/16 0453 01/21/16 0447 01/23/16 0812 01/24/16 0529 01/25/16 0500 01/25/16 2200 01/26/16 0613  NA 133* 134*  --  133* 135  --  138  K 5.0  --   --  5.0 4.7  --  4.4  CL 106  --   --  103 101  --  101  CO2 18* 20*  --  23 27  --  30  GLUCOSE 122*  --   --  232* 379* 431* 330*  BUN 18  --   --  27* 39*  --  42*  CREATININE 1.60* 1.56* 1.61* 1.61* 1.76*  --  1.56*  CALCIUM 8.1*  --   --  8.0* 7.7*  --  7.6*  PHOS 2.7 3.2  --  5.6* 5.4*  --   --     Liver Function Tests:  Recent Labs Lab 01/24/16 0529 01/25/16 0500  ALBUMIN 2.2* 2.2*   No results for input(s): LIPASE, AMYLASE in the last 168 hours. No results for input(s): AMMONIA in the last 168  hours.  CBC:  Recent Labs Lab 01/21/16 0447 01/22/16 1013 01/23/16 0812 01/24/16 0529 01/25/16 0446 01/26/16 0613  WBC 3.2* 3.6* 3.2* 2.6* 7.9 13.2*  NEUTROABS 2.2  --   --   --   --   --   HGB 12.4* 13.0 12.3* 11.8* 11.5* 11.5*  HCT 37.2* 39.3* 37.5* 35.4* 34.0* 35.1*  MCV 77.8* 79.5* 78.3* 79.2* 79.4* 79.1*  PLT 84* 93* 84* 86* 98* 104*    Cardiac Enzymes: No results for input(s): CKTOTAL, CKMB, CKMBINDEX, TROPONINI in the last 168 hours.  BNP: Invalid input(s): POCBNP  CBG:  Recent Labs Lab 01/25/16 1158 01/25/16 1654 01/25/16 1715 01/25/16 2054 01/26/16 0754  GLUCAP 396* 416* 402* 437* 299*    Microbiology: Results for orders placed or performed during the hospital encounter of 01/15/16  C difficile quick scan w PCR reflex     Status: None   Collection Time: 01/20/16  8:05 AM  Result Value Ref Range Status   C Diff antigen NEGATIVE NEGATIVE Final   C Diff toxin NEGATIVE NEGATIVE Final   C Diff  interpretation No C. difficile detected.  Final  Gastrointestinal Panel by PCR , Stool     Status: None   Collection Time: 01/20/16  8:05 AM  Result Value Ref Range Status   Campylobacter species NOT DETECTED NOT DETECTED Final   Plesimonas shigelloides NOT DETECTED NOT DETECTED Final   Salmonella species NOT DETECTED NOT DETECTED Final   Yersinia enterocolitica NOT DETECTED NOT DETECTED Final   Vibrio species NOT DETECTED NOT DETECTED Final   Vibrio cholerae NOT DETECTED NOT DETECTED Final   Enteroaggregative E coli (EAEC) NOT DETECTED NOT DETECTED Final   Enteropathogenic E coli (EPEC) NOT DETECTED NOT DETECTED Final   Enterotoxigenic E coli (ETEC) NOT DETECTED NOT DETECTED Final   Shiga like toxin producing E coli (STEC) NOT DETECTED NOT DETECTED Final   Shigella/Enteroinvasive E coli (EIEC) NOT DETECTED NOT DETECTED Final   Cryptosporidium NOT DETECTED NOT DETECTED Final   Cyclospora cayetanensis NOT DETECTED NOT DETECTED Final   Entamoeba histolytica NOT  DETECTED NOT DETECTED Final   Giardia lamblia NOT DETECTED NOT DETECTED Final   Adenovirus F40/41 NOT DETECTED NOT DETECTED Final   Astrovirus NOT DETECTED NOT DETECTED Final   Norovirus GI/GII NOT DETECTED NOT DETECTED Final   Rotavirus A NOT DETECTED NOT DETECTED Final   Sapovirus (I, II, IV, and V) NOT DETECTED NOT DETECTED Final    Coagulation Studies: No results for input(s): LABPROT, INR in the last 72 hours.  Urinalysis: No results for input(s): COLORURINE, LABSPEC, PHURINE, GLUCOSEU, HGBUR, BILIRUBINUR, KETONESUR, PROTEINUR, UROBILINOGEN, NITRITE, LEUKOCYTESUR in the last 72 hours.  Invalid input(s): APPERANCEUR    Imaging: No results found.   Medications:    . allopurinol  300 mg Oral Daily  . carvedilol  6.25 mg Oral BID WC  . dronabinol  2.5 mg Oral BID AC  . enoxaparin (LOVENOX) injection  90 mg Subcutaneous Q12H  . gentamicin irrigation   Irrigation Once  . insulin aspart  0-5 Units Subcutaneous QHS  . insulin aspart  0-9 Units Subcutaneous TID WC  . insulin aspart  5 Units Subcutaneous TID WC  . insulin glargine  20 Units Subcutaneous QHS  . ketoconazole   Topical Daily  . pantoprazole  40 mg Oral Daily  . pneumococcal 23 valent vaccine  0.5 mL Intramuscular Tomorrow-1000  . predniSONE  50 mg Oral BID WC  . Tbo-Filgrastim  480 mcg Subcutaneous Q2000   acetaminophen **OR** acetaminophen, ALPRAZolam, HYDROcodone-acetaminophen, ondansetron **OR** ondansetron (ZOFRAN) IV  Assessment/ Plan:  [name] is a 82 y.o. white male with new onset lymphoma, hypertension, diabetes mellitus type II, hyperlipidemia, coronary artery disease, DVT on xarelto, BPH, RBB, who was admitted to [facility] on 01/15/2016 for tumor lysis syndrome renal insufficiency hyperuremia   1. Acute renal failure on chronic kidney disease stage III: with hyperkalemia and hyponatremia: baseline creatinine of 1.6, eGFR of 38 from 12/31/15.  Admission creatinine 2.34 CT angiogram on 11/6.   -continue supportive care Today's Cr 1.56  2. 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  - encouraged oral fluid intake    3. Lymphoma and CLL - chemotherapy as per Oncology team     LOS: 11 Hayden Martin 11/25/201711:14 AM

## 2016-01-27 LAB — CBC WITH DIFFERENTIAL/PLATELET
BASOS PCT: 0 %
Basophils Absolute: 0 10*3/uL (ref 0–0.1)
EOS ABS: 1.1 10*3/uL — AB (ref 0–0.7)
EOS PCT: 7 %
HEMATOCRIT: 34.6 % — AB (ref 40.0–52.0)
HEMOGLOBIN: 11.4 g/dL — AB (ref 13.0–18.0)
LYMPHS PCT: 16 %
Lymphs Abs: 2.5 10*3/uL (ref 1.0–3.6)
MCH: 26.3 pg (ref 26.0–34.0)
MCHC: 32.9 g/dL (ref 32.0–36.0)
MCV: 80.1 fL (ref 80.0–100.0)
Monocytes Absolute: 0.3 10*3/uL (ref 0.2–1.0)
Monocytes Relative: 2 %
NEUTROS ABS: 11.7 10*3/uL — AB (ref 1.4–6.5)
Neutrophils Relative %: 75 %
Platelets: 102 10*3/uL — ABNORMAL LOW (ref 150–440)
RBC: 4.32 MIL/uL — ABNORMAL LOW (ref 4.40–5.90)
RDW: 20.8 % — AB (ref 11.5–14.5)
WBC: 15.6 10*3/uL — ABNORMAL HIGH (ref 3.8–10.6)

## 2016-01-27 LAB — RENAL FUNCTION PANEL
ALBUMIN: 2.3 g/dL — AB (ref 3.5–5.0)
ANION GAP: 8 (ref 5–15)
BUN: 47 mg/dL — AB (ref 6–20)
CHLORIDE: 101 mmol/L (ref 101–111)
CO2: 28 mmol/L (ref 22–32)
Calcium: 7.3 mg/dL — ABNORMAL LOW (ref 8.9–10.3)
Creatinine, Ser: 1.56 mg/dL — ABNORMAL HIGH (ref 0.61–1.24)
GFR calc Af Amer: 46 mL/min — ABNORMAL LOW (ref >60)
GFR calc non Af Amer: 40 mL/min — ABNORMAL LOW (ref >60)
GLUCOSE: 294 mg/dL — AB (ref 65–99)
PHOSPHORUS: 4.4 mg/dL (ref 2.5–4.6)
POTASSIUM: 4.6 mmol/L (ref 3.5–5.1)
Sodium: 137 mmol/L (ref 135–145)

## 2016-01-27 LAB — GLUCOSE, CAPILLARY
GLUCOSE-CAPILLARY: 319 mg/dL — AB (ref 65–99)
GLUCOSE-CAPILLARY: 300 mg/dL — AB (ref 65–99)
Glucose-Capillary: 277 mg/dL — ABNORMAL HIGH (ref 65–99)
Glucose-Capillary: 320 mg/dL — ABNORMAL HIGH (ref 65–99)

## 2016-01-27 LAB — MAGNESIUM: MAGNESIUM: 1.8 mg/dL (ref 1.7–2.4)

## 2016-01-27 LAB — URIC ACID: Uric Acid, Serum: 5.3 mg/dL (ref 4.4–7.6)

## 2016-01-27 LAB — LACTATE DEHYDROGENASE: LDH: 822 U/L — ABNORMAL HIGH (ref 98–192)

## 2016-01-27 MED ORDER — INSULIN GLARGINE 100 UNIT/ML ~~LOC~~ SOLN
30.0000 [IU] | Freq: Every day | SUBCUTANEOUS | Status: DC
  Administered 2016-01-27: 21:00:00 30 [IU] via SUBCUTANEOUS
  Filled 2016-01-27 (×2): qty 0.3

## 2016-01-27 MED ORDER — INSULIN ASPART 100 UNIT/ML ~~LOC~~ SOLN
8.0000 [IU] | Freq: Three times a day (TID) | SUBCUTANEOUS | Status: DC
  Filled 2016-01-27: qty 8

## 2016-01-27 MED ORDER — INSULIN ASPART 100 UNIT/ML ~~LOC~~ SOLN
8.0000 [IU] | Freq: Three times a day (TID) | SUBCUTANEOUS | Status: DC
  Administered 2016-01-27 – 2016-01-28 (×4): 8 [IU] via SUBCUTANEOUS
  Filled 2016-01-27 (×3): qty 8

## 2016-01-27 NOTE — Progress Notes (Signed)
[facility]  ROUNDING NOTE   Subjective:   Feels well today Was able to sit in the chair No SOB, no Leg edema Now able to eat without nausea and vomiting  Objective:  Vital signs in last 24 hours:  Temp:  [97.5 F (36.4 C)-97.7 F (36.5 C)] 97.7 F (36.5 C) (11/26 0418) Pulse Rate:  [73-79] 76 (11/26 0833) Resp:  [20] 20 (11/26 0418) BP: (107-120)/(38-50) 107/38 (11/26 0833) SpO2:  [91 %-92 %] 92 % (11/26 0418)  Weight change:  Filed Weights   01/15/16 1430 01/15/16 1533 01/15/16 1613  Weight: 92.3 kg (203 lb 8 oz) 92.3 kg (203 lb 8 oz) 92.3 kg (203 lb 8 oz)    Intake/Output: I/O last 3 completed shifts: In: 720 [P.O.:720] Out: 900 [Urine:900]   Intake/Output this shift:  Total I/O In: 240 [P.O.:240] Out: -   Physical Exam: General: NAD,   Head: Decreased hearing Moist oral mucosal membranes  Eyes: Anicteric,   Neck: Supple, trachea midline  Lungs:  Clear to auscultation  Heart: No rubs or gallops  Abdomen:  Soft, nontender,   Extremities: trace lower extremity edema  Neurologic: Nonfocal, moving all four extremities  Skin: Warm dry. Some redness around port        Basic Metabolic Panel:  Recent Labs Lab 01/21/16 0447 01/23/16 0812  01/24/16 0529 01/25/16 0500 01/25/16 2200 01/26/16 0613 01/27/16 0546  NA 134*  --   --  133* 135  --  138 137  K  --   --   --  5.0 4.7  --  4.4 4.6  CL  --   --   --  103 101  --  101 101  CO2 20*  --   --  23 27  --  30 28  GLUCOSE  --   --   --  232* 379* 431* 330* 294*  BUN  --   --   --  27* 39*  --  42* 47*  CREATININE 1.56* 1.61*  --  1.61* 1.76*  --  1.56* 1.56*  CALCIUM  --   --   < > 8.0* 7.7*  --  7.6* 7.3*  MG  --   --   --   --   --   --   --  1.8  PHOS 3.2  --   --  5.6* 5.4*  --   --  4.4  < > = values in this interval not displayed.  Liver Function Tests:  Recent Labs Lab 01/24/16 0529 01/25/16 0500 01/27/16 0546  ALBUMIN 2.2* 2.2* 2.3*   No results for input(s): LIPASE,  AMYLASE in the last 168 hours. No results for input(s): AMMONIA in the last 168 hours.  CBC:  Recent Labs Lab 01/21/16 0447  01/23/16 0812 01/24/16 0529 01/25/16 0446 01/26/16 0613 01/27/16 0546  WBC 3.2*  < > 3.2* 2.6* 7.9 13.2* 15.6*  NEUTROABS 2.2  --   --   --   --   --  11.7*  HGB 12.4*  < > 12.3* 11.8* 11.5* 11.5* 11.4*  HCT 37.2*  < > 37.5* 35.4* 34.0* 35.1* 34.6*  MCV 77.8*  < > 78.3* 79.2* 79.4* 79.1* 80.1  PLT 84*  < > 84* 86* 98* 104* 102*  < > = values in this interval not displayed.  Cardiac Enzymes: No results for input(s): CKTOTAL, CKMB, CKMBINDEX, TROPONINI in the last 168 hours.  BNP: Invalid input(s): POCBNP  CBG:  Recent Labs Lab 01/26/16  7124 01/26/16 1121 01/26/16 1628 01/26/16 2203 01/27/16 0717  GLUCAP 299* 277* 268* 285* 319*    Microbiology: Results for orders placed or performed during the hospital encounter of 01/15/16  C difficile quick scan w PCR reflex     Status: None   Collection Time: 01/20/16  8:05 AM  Result Value Ref Range Status   C Diff antigen NEGATIVE NEGATIVE Final   C Diff toxin NEGATIVE NEGATIVE Final   C Diff interpretation No C. difficile detected.  Final  Gastrointestinal Panel by PCR , Stool     Status: None   Collection Time: 01/20/16  8:05 AM  Result Value Ref Range Status   Campylobacter species NOT DETECTED NOT DETECTED Final   Plesimonas shigelloides NOT DETECTED NOT DETECTED Final   Salmonella species NOT DETECTED NOT DETECTED Final   Yersinia enterocolitica NOT DETECTED NOT DETECTED Final   Vibrio species NOT DETECTED NOT DETECTED Final   Vibrio cholerae NOT DETECTED NOT DETECTED Final   Enteroaggregative E coli (EAEC) NOT DETECTED NOT DETECTED Final   Enteropathogenic E coli (EPEC) NOT DETECTED NOT DETECTED Final   Enterotoxigenic E coli (ETEC) NOT DETECTED NOT DETECTED Final   Shiga like toxin producing E coli (STEC) NOT DETECTED NOT DETECTED Final   Shigella/Enteroinvasive E coli (EIEC) NOT DETECTED  NOT DETECTED Final   Cryptosporidium NOT DETECTED NOT DETECTED Final   Cyclospora cayetanensis NOT DETECTED NOT DETECTED Final   Entamoeba histolytica NOT DETECTED NOT DETECTED Final   Giardia lamblia NOT DETECTED NOT DETECTED Final   Adenovirus F40/41 NOT DETECTED NOT DETECTED Final   Astrovirus NOT DETECTED NOT DETECTED Final   Norovirus GI/GII NOT DETECTED NOT DETECTED Final   Rotavirus A NOT DETECTED NOT DETECTED Final   Sapovirus (I, II, IV, and V) NOT DETECTED NOT DETECTED Final    Coagulation Studies: No results for input(s): LABPROT, INR in the last 72 hours.  Urinalysis: No results for input(s): COLORURINE, LABSPEC, PHURINE, GLUCOSEU, HGBUR, BILIRUBINUR, KETONESUR, PROTEINUR, UROBILINOGEN, NITRITE, LEUKOCYTESUR in the last 72 hours.  Invalid input(s): APPERANCEUR    Imaging: No results found.   Medications:    . allopurinol  300 mg Oral Daily  . carvedilol  6.25 mg Oral BID WC  . dronabinol  2.5 mg Oral BID AC  . enoxaparin (LOVENOX) injection  90 mg Subcutaneous Q12H  . gentamicin irrigation   Irrigation Once  . insulin aspart  0-5 Units Subcutaneous QHS  . insulin aspart  0-9 Units Subcutaneous TID WC  . insulin aspart  5 Units Subcutaneous TID WC  . insulin glargine  20 Units Subcutaneous QHS  . ketoconazole   Topical Daily  . pantoprazole  40 mg Oral Daily  . pneumococcal 23 valent vaccine  0.5 mL Intramuscular Tomorrow-1000  . predniSONE  50 mg Oral BID WC  . Tbo-Filgrastim  480 mcg Subcutaneous Q2000   acetaminophen **OR** acetaminophen, ALPRAZolam, HYDROcodone-acetaminophen, ondansetron **OR** ondansetron (ZOFRAN) IV  Assessment/ Plan:  [name] is a 82 y.o. white male with new onset lymphoma, hypertension, diabetes mellitus type II, hyperlipidemia, coronary artery disease, DVT on xarelto, BPH, RBB, who was admitted to [facility] on 01/15/2016 for tumor lysis syndrome renal insufficiency hyperuremia   1. Acute renal failure on chronic kidney  disease stage III: with hyperkalemia and hyponatremia: baseline creatinine of 1.6, eGFR of 38 from 12/31/15.  Admission creatinine 2.34 CT angiogram on 11/6.  -continue supportive care Today's Cr 1.56  2. 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  - encouraged  oral fluid intake  - Phos 4.4 , uric acid 5.3  3. Lymphoma and CLL - chemotherapy as per Oncology team     LOS: 12 Twila Rappa 11/26/201711:42 AM

## 2016-01-27 NOTE — Progress Notes (Signed)
[place] at [place] NAME: Hayden Martin    MR#:  [id number]  DATE OF BIRTH:  07/10/1931  SUBJECTIVE:   S/p chemo for Diffuse Large B-cell Lymphoma.  S/p mini-CHOP on 11/22 & 11/23.  Feels fine and has no complaints. Renal function and counts are stable.   REVIEW OF SYSTEMS:    Review of Systems  Constitutional: Negative for chills and fever.  HENT: Negative for congestion and tinnitus.   Eyes: Negative for blurred vision and double vision.  Respiratory: Negative for cough, shortness of breath and wheezing.   Cardiovascular: Negative for chest pain, orthopnea and PND.  Gastrointestinal: Negative for abdominal pain, diarrhea, nausea and vomiting.  Genitourinary: Negative for dysuria and hematuria.  Neurological: Positive for weakness. Negative for dizziness, sensory change and focal weakness.  All other systems reviewed and are negative.   Nutrition: Carb control Tolerating Diet: Yes  Tolerating PT: Yes Eval noted    DRUG ALLERGIES:   Allergies  Allergen Reactions  . Codeine Itching  . Penicillins Itching    VITALS:  Blood pressure (!) 107/38, pulse 76, temperature 97.7 F (36.5 C), temperature source Oral, resp. rate 20, height 5' 11" (1.803 m), weight 92.3 kg (203 lb 8 oz), SpO2 92 %.  PHYSICAL EXAMINATION:   Physical Exam  GENERAL:  82 y.o.-year-old patient lying in the bed in no acute distress.  EYES: Pupils equal, round, reactive to light and accommodation. No scleral icterus. Extraocular muscles intact.  HEENT: Head atraumatic, normocephalic. Oropharynx and nasopharynx clear.  NECK:  Supple, no jugular venous distention. No thyroid enlargement, no tenderness.  LUNGS: Normal breath sounds bilaterally, no wheezing, rales, rhonchi. No use of accessory muscles of respiration.  CARDIOVASCULAR: S1, S2 normal. II/VI SEM at LSB, No rubs, or gallops.  ABDOMEN: Soft, nontender, nondistended. Bowel sounds present. No organomegaly  or mass.  EXTREMITIES: No cyanosis, clubbing, +1-2 edema on LLE > Right.  NEUROLOGIC: Cranial nerves II through XII are intact. No focal Motor or sensory deficits b/l. Globally weak  PSYCHIATRIC: The patient is alert and oriented x 3.  SKIN: No obvious rash, lesion, or ulcer.    LABORATORY PANEL:   CBC  Recent Labs Lab 01/27/16 0546  WBC 15.6*  HGB 11.4*  HCT 34.6*  PLT 102*   ------------------------------------------------------------------------------------------------------------------  Chemistries   Recent Labs Lab 01/27/16 0546  NA 137  K 4.6  CL 101  CO2 28  GLUCOSE 294*  BUN 47*  CREATININE 1.56*  CALCIUM 7.3*  MG 1.8   ------------------------------------------------------------------------------------------------------------------  Cardiac Enzymes No results for input(s): TROPONINI in the last 168 hours. ------------------------------------------------------------------------------------------------------------------  RADIOLOGY:  No results found.   ASSESSMENT AND PLAN:   82 year old male with past medical history of diabetes, hypertension, previous history of DVT, suspected lymphoma, admitted to the hospital due to acute renal failure secondary to hyperuricemia and tumor lysis syndrome.  1. Acute on chronic renal failure-secondary to tumor lysis syndrome. - Cr. Stable now at 1.5. Nephro following. Off bicarb now.    2. Metabolic Acidosis - due to ARF and improved w/ Bicarb and as renal function has improved.   3. Hyperkalemia - due to AKI and now resolved.   4. Hyperuricemia - due to Tumor lysis syndrome from Lymphoma.  - resolved. Cont. Allopurinol.   5. Suspected Lymphoma - PET Scan as outpatient + for lymph nodes in the neck chest abdomen pelvis and also splenomegaly. Patient is status post bone marrow biopsy and Port-A-Cath placement. - appreciate  Oncology help and s/p Mini R-CHOP on 11/22, 11/23. High risk for Tumor Lysis syndrome  but  counts so far are stable.     6. History of DVT-patient is status post IVC filter placement. - cont. Lovenox.   7. Anemia/thrombocytopenia-secondary to the suspected lymphoma.  - counts stable and will monitor.   8. Diabetes type 2 without complication- appreciate Diabetes Coordinator input.  BS a bit higher due to diet.  - will advance Lantus, Novolog with meals, cont. SSI coverage.   9. HTN - cont. Coreg.   Likely d/c to Rehab tomorrow.  All the records are reviewed and case discussed with Care Management/Social Worker. Management plans discussed with the patient, family and they are in agreement.  CODE STATUS: Full code  DVT Prophylaxis: Lovenox.  TOTAL TIME TAKING CARE OF THIS PATIENT: 25 minutes.   POSSIBLE D/C IN 1-2 DAYS, DEPENDING ON CLINICAL CONDITION.   SAINANI,VIVEK J M.D on 01/27/2016 at 12:24 PM  Between 7am to 6pm - Pager - [phone]  After 6pm go to [email or web address] - password [email or web address]  [facility]  Office  [phone]  CC: Primary care physician; ANDERSON,MARSHALL W., MD  

## 2016-01-28 LAB — BASIC METABOLIC PANEL
Anion gap: 3 — ABNORMAL LOW (ref 5–15)
BUN: 49 mg/dL — AB (ref 6–20)
CALCIUM: 7.5 mg/dL — AB (ref 8.9–10.3)
CHLORIDE: 102 mmol/L (ref 101–111)
CO2: 32 mmol/L (ref 22–32)
CREATININE: 1.5 mg/dL — AB (ref 0.61–1.24)
GFR, EST AFRICAN AMERICAN: 48 mL/min — AB (ref >60)
GFR, EST NON AFRICAN AMERICAN: 42 mL/min — AB (ref >60)
Glucose, Bld: 257 mg/dL — ABNORMAL HIGH (ref 65–99)
Potassium: 4.4 mmol/L (ref 3.5–5.1)
SODIUM: 137 mmol/L (ref 135–145)

## 2016-01-28 LAB — CBC
HCT: 35.5 % — ABNORMAL LOW (ref 40.0–52.0)
Hemoglobin: 11.5 g/dL — ABNORMAL LOW (ref 13.0–18.0)
MCH: 25.5 pg — ABNORMAL LOW (ref 26.0–34.0)
MCHC: 32.5 g/dL (ref 32.0–36.0)
MCV: 78.3 fL — AB (ref 80.0–100.0)
PLATELETS: 90 10*3/uL — AB (ref 150–440)
RBC: 4.53 MIL/uL (ref 4.40–5.90)
RDW: 20.1 % — AB (ref 11.5–14.5)
WBC: 15 10*3/uL — AB (ref 3.8–10.6)

## 2016-01-28 LAB — GLUCOSE, CAPILLARY
GLUCOSE-CAPILLARY: 225 mg/dL — AB (ref 65–99)
Glucose-Capillary: 214 mg/dL — ABNORMAL HIGH (ref 65–99)

## 2016-01-28 MED ORDER — POLYETHYLENE GLYCOL 3350 17 G PO PACK
17.0000 g | PACK | Freq: Every day | ORAL | Status: DC
  Administered 2016-01-28: 11:00:00 17 g via ORAL
  Filled 2016-01-28: qty 1

## 2016-01-28 MED ORDER — HEPARIN SOD (PORK) LOCK FLUSH 10 UNIT/ML IV SOLN
10.0000 [IU] | Freq: Once | INTRAVENOUS | Status: AC
  Administered 2016-01-28: 11:00:00 10 [IU]
  Filled 2016-01-28: qty 1

## 2016-01-28 MED ORDER — HYDROCODONE-ACETAMINOPHEN 5-325 MG PO TABS: 1.0000 | ORAL_TABLET | Freq: Four times a day (QID) | ORAL | 0 refills | Status: DC | PRN

## 2016-01-28 MED ORDER — ALLOPURINOL 300 MG PO TABS: 300.0000 mg | ORAL_TABLET | Freq: Every day | ORAL | 0 refills | Status: DC

## 2016-01-28 MED ORDER — POLYETHYLENE GLYCOL 3350 17 G PO PACK: 17.0000 g | PACK | Freq: Every day | ORAL | 0 refills | Status: DC | PRN

## 2016-01-28 MED ORDER — ENOXAPARIN SODIUM 30 MG/0.3ML ~~LOC~~ SOLN: 90.0000 mg | Freq: Two times a day (BID) | SUBCUTANEOUS | 0 refills | Status: DC

## 2016-01-28 MED ORDER — ALPRAZOLAM 0.25 MG PO TABS: 0.2500 mg | ORAL_TABLET | Freq: Four times a day (QID) | ORAL | 0 refills | Status: DC | PRN

## 2016-01-28 MED ORDER — DRONABINOL 2.5 MG PO CAPS
2.5000 mg | ORAL_CAPSULE | Freq: Two times a day (BID) | ORAL | 0 refills | Status: DC
Start: 2016-01-28 — End: 2018-04-16

## 2016-01-28 NOTE — Progress Notes (Signed)
Patient is medically stable for D/C to [facility] today. Per Tammy admissions coordinator at [facility] patient will go to room 106. RN will call report to RN Theressa Stamps at [facility]. Patient's son [name] will transport him. Clinical Education officer, museum (CSW) sent D/C orders to [facility] via [facility]. Patient is aware of above. Patient's son [name] is at bedside and aware of above. Please reconsult if future social work needs arise. CSW signing off.   [employer], LCSW [phone]

## 2016-01-28 NOTE — Discharge Summary (Signed)
[place] [facility] [place] NAME: Hayden Martin    MR#:  [id number]  DATE OF BIRTH:  May 13, 1931  DATE OF ADMISSION:  01/15/2016 ADMITTING PHYSICIAN: Dustin Flock, MD  DATE OF DISCHARGE: 01/28/2016  PRIMARY CARE PHYSICIAN: Kirk Ruths., MD    ADMISSION DIAGNOSIS:  tumor lysis syndrome renal insufficiency hyperuremia thrombocytopenia lymphoma lymphoma  DISCHARGE DIAGNOSIS:  Active Problems:   Tumor lysis syndrome   Chronic kidney disease, stage III (moderate)   DNR (do not resuscitate) discussion   Palliative care by specialist   SECONDARY DIAGNOSIS:   Past Medical History:  Diagnosis Date  . Clotting disorder ([place])   . Diabetes mellitus without complication ([place])   . Heart disease   . Hypertension     HOSPITAL COURSE:   1. Acute kidney injury on chronic kidney disease stage III. This was secondary to tumor lysis syndrome. Creatinine was 2.34 on admission and now down to 1.5. Patient was on bicarbonate drip and followed by nephrology during the hospital course. 2. Metabolic acidosis secondary to acute kidney injury. Improved with bicarbonate drip. 3. Hyperkalemia secondary to acute kidney injury. This has resolved. 4. 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 This has resolved. Continue allopurinol. 5. Non-Hodgkin's lymphoma. Diffuse large B cell. Patient received first dose of R CHOP. He also received Neupogen. Follow-up with oncology as outpatient. Patient had Port-A-Cath placement. 6. Type 2 diabetes mellitus with sugars elevated secondary to prednisone. Continue Lantus insulin and short acting insulin prior to meals.  7. DVT. Status post IVC filter placement continue Lovenox injections. 8. Anemia and thrombocytopenia. Secondary to lymphoma. Continue to monitor counts with oncology as outpatient. 9. Essential hypertension on Coreg. 10. Weakness. Physical therapy recommended rehabilitation. 11. Constipation.  Try MiraLAX when necessary     DISCHARGE CONDITIONS:   Satisfactory  CONSULTS OBTAINED:  Treatment Team:  Lloyd Huger, MD Lavonia Dana, MD Katha Cabal, MD  DRUG ALLERGIES:   Allergies  Allergen Reactions  . Codeine Itching  . Penicillins Itching    DISCHARGE MEDICATIONS:   Current Discharge Medication List    START taking these medications   Details  allopurinol (ZYLOPRIM) 300 MG tablet Take 1 tablet (300 mg total) by mouth daily. Qty: 30 tablet, Refills: 0    ALPRAZolam (XANAX) 0.25 MG tablet Take 1 tablet (0.25 mg total) by mouth every 6 (six) hours as needed for anxiety. Qty: 12 tablet, Refills: 0    dronabinol (MARINOL) 2.5 MG capsule Take 1 capsule (2.5 mg total) by mouth 2 (two) times daily before lunch and supper. Qty: 60 capsule, Refills: 0    enoxaparin (LOVENOX) 30 MG/0.3ML injection Inject 0.9 mLs (90 mg total) into the skin every 12 (twelve) hours. Qty: 60 Syringe, Refills: 0    HYDROcodone-acetaminophen (NORCO/VICODIN) 5-325 MG tablet Take 1 tablet by mouth every 6 (six) hours as needed for moderate pain. Qty: 12 tablet, Refills: 0    polyethylene glycol (MIRALAX / GLYCOLAX) packet Take 17 g by mouth daily as needed for moderate constipation. Qty: 14 each, Refills: 0      CONTINUE these medications which have NOT CHANGED   Details  carvedilol (COREG) 6.25 MG tablet TAKE ONE (1) TABLET BY MOUTH TWO (2) TIMES DAILY    insulin aspart (NOVOLOG) 100 UNIT/ML FlexPen Inject 8 Units into the skin 3 (three) times daily with meals.     Insulin Glargine (LANTUS [place]) Inject into the skin. Inject 35 units subcutaneously at bedtime.    ketoconazole (  NIZORAL) 2 % cream APPLY AT BEDTIME DAILY TO AFFECTED AREAS      STOP taking these medications     rivaroxaban (XARELTO) 20 MG TABS tablet      aspirin (GOODSENSE ASPIRIN) 325 MG tablet      atorvastatin (LIPITOR) 20 MG tablet      lisinopril-hydrochlorothiazide (PRINZIDE,ZESTORETIC) 20-25 MG  tablet          DISCHARGE INSTRUCTIONS:   Follow-up Dr. rehabilitation one day Follow-up Dr. Candiss Norse nephrology 2 weeks Follow-up with Dr. Mike Gip oncology 2 weeks  If you experience worsening of your admission symptoms, develop shortness of breath, life threatening emergency, suicidal or homicidal thoughts you must seek medical attention immediately by calling 911 or calling your MD immediately  if symptoms less severe.  You Must read complete instructions/literature along with all the possible adverse reactions/side effects for all the Medicines you take and that have been prescribed to you. Take any new Medicines after you have completely understood and accept all the possible adverse reactions/side effects.   Please note  You were cared for by a hospitalist during your hospital stay. If you have any questions about your discharge medications or the care you received while you were in the hospital after you are discharged, you can call the unit and asked to speak with the hospitalist on call if the hospitalist that took care of you is not available. Once you are discharged, your primary care physician will handle any further medical issues. Please note that NO REFILLS for any discharge medications will be authorized once you are discharged, as it is imperative that you return to your primary care physician (or establish a relationship with a primary care physician if you do not have one) for your aftercare needs so that they can reassess your need for medications and monitor your lab values.    Today   CHIEF COMPLAINT:  No chief complaint on file.   HISTORY OF PRESENT ILLNESS:  Hayden Martin  is a 82 y.o. male presented with not feeling well.   VITAL SIGNS:  Blood pressure (!) 123/51, pulse 76, temperature 97.9 F (36.6 C), temperature source Oral, resp. rate (!) 22, height 5\' 11"  (1.803 m), weight 92.3 kg (203 lb 8 oz), SpO2 94 %.    PHYSICAL EXAMINATION:  GENERAL:  82  y.o.-year-old patient lying in the bed with no acute distress.  EYES: Pupils equal, round, reactive to light and accommodation. No scleral icterus. Extraocular muscles intact.  HEENT: Head atraumatic, normocephalic. Oropharynx and nasopharynx clear.  NECK:  Supple, no jugular venous distention. No thyroid enlargement, no tenderness.  LUNGS: Normal breath sounds bilaterally, no wheezing, rales,rhonchi or crepitation. No use of accessory muscles of respiration.  CARDIOVASCULAR: S1, S2 normal. 2/6 systolic murmurs. No rubs, or gallops.  ABDOMEN: Soft, non-tender, non-distended. Bowel sounds present. No organomegaly or mass.  EXTREMITIES: 2+ bilateral lower extremity edema. No cyanosis, or clubbing.  NEUROLOGIC: Cranial nerves II through XII are intact. Muscle strength 5/5 in all extremities. Sensation intact. Gait not checked.  PSYCHIATRIC: The patient is alert and oriented x 3.  SKIN: No obvious rash, lesion, or ulcer.   DATA REVIEW:   CBC  Recent Labs Lab 01/28/16 0541  WBC 15.0*  HGB 11.5*  HCT 35.5*  PLT 90*    Chemistries   Recent Labs Lab 01/27/16 0546 01/28/16 0541  NA 137 137  K 4.6 4.4  CL 101 102  CO2 28 32  GLUCOSE 294* 257*  BUN 47* 49*  CREATININE 1.56* 1.50*  CALCIUM 7.3* 7.5*  MG 1.8  --     Microbiology Results  Results for orders placed or performed during the hospital encounter of 01/15/16  C difficile quick scan w PCR reflex     Status: None   Collection Time: 01/20/16  8:05 AM  Result Value Ref Range Status   C Diff antigen NEGATIVE NEGATIVE Final   C Diff toxin NEGATIVE NEGATIVE Final   C Diff interpretation No C. difficile detected.  Final  Gastrointestinal Panel by PCR , Stool     Status: None   Collection Time: 01/20/16  8:05 AM  Result Value Ref Range Status   Campylobacter species NOT DETECTED NOT DETECTED Final   Plesimonas shigelloides NOT DETECTED NOT DETECTED Final   Salmonella species NOT DETECTED NOT DETECTED Final   Yersinia  enterocolitica NOT DETECTED NOT DETECTED Final   Vibrio species NOT DETECTED NOT DETECTED Final   Vibrio cholerae NOT DETECTED NOT DETECTED Final   Enteroaggregative E coli (EAEC) NOT DETECTED NOT DETECTED Final   Enteropathogenic E coli (EPEC) NOT DETECTED NOT DETECTED Final   Enterotoxigenic E coli (ETEC) NOT DETECTED NOT DETECTED Final   Shiga like toxin producing E coli (STEC) NOT DETECTED NOT DETECTED Final   Shigella/Enteroinvasive E coli (EIEC) NOT DETECTED NOT DETECTED Final   Cryptosporidium NOT DETECTED NOT DETECTED Final   Cyclospora cayetanensis NOT DETECTED NOT DETECTED Final   Entamoeba histolytica NOT DETECTED NOT DETECTED Final   Giardia lamblia NOT DETECTED NOT DETECTED Final   Adenovirus F40/41 NOT DETECTED NOT DETECTED Final   Astrovirus NOT DETECTED NOT DETECTED Final   Norovirus GI/GII NOT DETECTED NOT DETECTED Final   Rotavirus A NOT DETECTED NOT DETECTED Final   Sapovirus (I, II, IV, and V) NOT DETECTED NOT DETECTED Final       Management plans discussed with the patient, family and they are in agreement.  CODE STATUS:     Code Status Orders        Start     Ordered   01/15/16 1512  Full code  Continuous     01/15/16 1511    Code Status History    Date Active Date Inactive Code Status Order ID Comments User Context   This patient has a current code status but no historical code status.    Advance Directive Documentation   [place] Most Recent Value  Type of Advance Directive  Living will  Pre-existing out of facility DNR order (yellow form or pink MOST form)  No data  "MOST" Form in Place?  No data      TOTAL TIME TAKING CARE OF THIS PATIENT: 35 minutes.    Loletha Grayer M.D on 01/28/2016 at 10:00 AM  Between 7am to 6pm - Pager - [phone]  After 6pm go to [email or web address] - password [email or web address]  [facility] Office  [phone]  CC: Primary care physician; Kirk Ruths., MD

## 2016-01-28 NOTE — Plan of Care (Signed)
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  He is 4 days post mini-CHOP chemo regimen. Patient tolerated extremely well.  He's had no pain and continues to have good appetite on marinol and prednisone.  Has some constipation and was started on miralax today.  Last BM was 2 days ago.  Enlarged spleen seems to be causing him some SOB.  Patient gets up with walker to bathroom.  Port was deaccessed and heparin locked.  Called report to Theressa Stamps, RN and his son and daughter in law are transporting him to [facility].  Packet with scripts was given to son.

## 2016-01-28 NOTE — Progress Notes (Signed)
Nutrition Follow-up  DOCUMENTATION CODES:   Severe malnutrition in context of chronic illness  INTERVENTION:  Continue Magic cup TID with meals, each supplement provides 290 kcal and 9 grams of protein. Patient prefers vanilla.  Encouraged patient to continue Magic Cup or similar ONS at [employer].  Also encouraged intake of more protein through meals and beverages to meet increased needs.  NUTRITION DIAGNOSIS:   Malnutrition related to chronic illness as evidenced by percent weight loss, energy intake < 75% for > or equal to 1 month.  Ongoing.  GOAL:   Patient will meet greater than or equal to 90% of their needs  Met with calories, progressing with protein.  MONITOR:   PO intake, Supplement acceptance, I & O's, Labs, Weight trends  REASON FOR ASSESSMENT:   Malnutrition Screening Tool    ASSESSMENT:   Hayden Martin  is a 82 y.o. male with a known history of  DVT, diabetes, hypertension who is referred from oncology office due to worsening renal failure, elevated LDH level, and elevated uric acid level who was seen by oncology first time in November 13  -Patient received Mini R-CHOP on 11/22 & 11/23.  -Tumor lysis syndrome improved - pt will remain on allopurinol. -Discharging to [facility] today.  Spoke with patient and family members at bedside. Appetite is much better and his taste is coming back. Patient enjoys the [employer] but prefers [employer]. Denies N/V, abdominal pain, or difficulty chewing/swallowing.  Meal Completion: 75-100% per chart. In the past 24 hours patient has had 1830 kcal (99% minimum estimated kcal needs) and 87 grams protein (73% minimum estimated protein needs).   Medications reviewed and include: allopurinol, Marinol 2.5 mg BID, Novolog sliding scale TID with meals and daily at bedtime, Lantus 30 units daily at bedtime, pantoprazole, Miralax.  Labs reviewed: CBG 225-320 past 24 hrs, BUN 49, Creatinine 1.5.   Diet Order:  Diet  heart healthy/carb modified Room service appropriate? Yes; Fluid consistency: Thin  Skin:  Reviewed, no issues  Last BM:  01/26/2016  Height:   Ht Readings from Last 1 Encounters:  01/15/16 _0  (1.803 m)    Weight:   Wt Readings from Last 1 Encounters:  01/15/16 203 lb 8 oz (92.3 kg)    Ideal Body Weight:  78.18 kg  BMI:  Body mass index is 28.38 kg/m.  Estimated Nutritional Needs:   Kcal:  1845-2306 calories  Protein:  120-140 gm  Fluid:  >/= 2L  EDUCATION NEEDS:   No education needs identified at this time  Willey Blade, MS, RD, LDN Pager: [phone] After Hours Pager: [phone]

## 2016-01-28 NOTE — Clinical Social Work Placement (Signed)
   CLINICAL SOCIAL WORK PLACEMENT  NOTE  Date:  01/28/2016  Patient Details  Name: Hayden Martin MRN: [id number] Date of Birth: Jan 23, 1932  Clinical Social Work is seeking post-discharge placement for this patient at the [place] level of care (*CSW will initial, date and re-position this form in  chart as items are completed):  Yes   Patient/family provided with [place] Work Department's list of facilities offering this level of care within the geographic area requested by the patient (or if unable, by the patient's family).  Yes   Patient/family informed of their freedom to choose among providers that offer the needed level of care, that participate in Medicare, Medicaid or managed care program needed by the patient, have an available bed and are willing to accept the patient.  Yes   Patient/family informed of [facility]'s ownership interest in [facility] and [facility], as well as of the fact that they are under no obligation to receive care at these facilities.  PASRR submitted to EDS on 01/17/16     PASRR number received on 01/17/16     Existing PASRR number confirmed on       FL2 transmitted to all facilities in geographic area requested by pt/family on 01/17/16     FL2 transmitted to all facilities within larger geographic area on       Patient informed that his/her managed care company has contracts with or will negotiate with certain facilities, including the following:        Yes   Patient/family informed of bed offers received.  Patient chooses bed at  ([facility] )     Physician recommends and patient chooses bed at      Patient to be transferred to  ([facility] ) on 01/28/16.  Patient to be transferred to facility by  (Patient's son Hayden Martin will transport him in private vehicle. )     Patient family notified on 01/28/16 of transfer.  Name of family member notified:   (Patient's son Hayden Martin is aware of D/C today and is at bedside. )      PHYSICIAN       Additional Comment:    _______________________________________________ Korbin Mapps, Veronia Beets, LCSW 01/28/2016, 12:30 PM

## 2016-01-28 NOTE — Care Management Important Message (Signed)
Important Message  Patient Details  Name: KENRIC LINDQUIST MRN: [id number] Date of Birth: 1931-08-23   Medicare Important Message Given:  Yes    Shelbie Ammons, RN 01/28/2016, 11:39 AM

## 2016-01-28 NOTE — Progress Notes (Signed)
ANTICOAGULATION CONSULT NOTE - Follow up [facility] for enoxaparin  Indication: DVT  Allergies  Allergen Reactions  . Codeine Itching  . Penicillins Itching    Patient Measurements: Height: 5\' 11"  (180.3 cm) Weight: 203 lb 8 oz (92.3 kg) IBW/kg (Calculated) : 75.3 Heparin Dosing Weight: 92 kg  Vital Signs: Temp: 97.9 F (36.6 C) (11/27 0355) Temp Source: Oral (11/27 0355) BP: 123/51 (11/27 0852) Pulse Rate: 76 (11/27 0852)  Labs:  Recent Labs  01/26/16 0613 01/27/16 0546 01/28/16 0541  HGB 11.5* 11.4* 11.5*  HCT 35.1* 34.6* 35.5*  PLT 104* 102* 90*  CREATININE 1.56* 1.56* 1.50*    Estimated Creatinine Clearance: 44.1 mL/min (by C-G formula based on SCr of 1.5 mg/dL (H)).   Medical History: Past Medical History:  Diagnosis Date  . Clotting disorder ([place])   . Diabetes mellitus without complication ([place])   . Heart disease   . Hypertension    Medications:  Scheduled:  . allopurinol  300 mg Oral Daily  . carvedilol  6.25 mg Oral BID WC  . dronabinol  2.5 mg Oral BID AC  . enoxaparin (LOVENOX) injection  90 mg Subcutaneous Q12H  . gentamicin irrigation   Irrigation Once  . insulin aspart  0-5 Units Subcutaneous QHS  . insulin aspart  0-9 Units Subcutaneous TID WC  . insulin aspart  8 Units Subcutaneous TID WC  . insulin glargine  30 Units Subcutaneous QHS  . ketoconazole   Topical Daily  . pantoprazole  40 mg Oral Daily  . pneumococcal 23 valent vaccine  0.5 mL Intramuscular Tomorrow-1000  . Tbo-Filgrastim  480 mcg Subcutaneous Q2000   Assessment: Pharmacy consulted to dose and monitor heparin drip in this 82 year old male admitted for tumor lysis syndrome and diagnosed with an acute lower extremity DVT.   Patient was taking rivaroxaban prior to admission for a history of DVT and was converted to apixaban on admission due to ARF and CrCl < 30 mL/min. Patient has received 2 doses of apixaban during admission - last dose at 2206 on 11/15.    Treatment team has decided that the patient should be treated with enoxaparin treatment dose outpatient.    Plan:  Continue Treatment dose Enoxaparin 1mg /kg (90mg ) every 12 hours. Continue to monitor Scr and CBC while inpatient.    Noralee Space, PharmD  Clinical Pharmacist 01/28/2016,9:30 AM

## 2016-01-29 NOTE — Telephone Encounter (Signed)
Dr. Rogue Bussing got pathology and gave info to family and pt

## 2016-02-02 ENCOUNTER — Emergency Department
Admission: EM | Admit: 2016-02-02 | Discharge: 2016-02-02 | Disposition: A | Discharge status: Home / Self Care | Payer: Medicare Other | Attending: Emergency Medicine | Admitting: Emergency Medicine

## 2016-02-02 ENCOUNTER — Emergency Department: Payer: Medicare Other

## 2016-02-02 DIAGNOSIS — R0602 Shortness of breath: Secondary | ICD-10-CM | POA: Diagnosis present

## 2016-02-02 DIAGNOSIS — E1122 Type 2 diabetes mellitus with diabetic chronic kidney disease: Secondary | ICD-10-CM | POA: Insufficient documentation

## 2016-02-02 DIAGNOSIS — I1310 Hypertensive heart and chronic kidney disease without heart failure, with stage 1 through stage 4 chronic kidney disease, or unspecified chronic kidney disease: Secondary | ICD-10-CM | POA: Diagnosis not present

## 2016-02-02 DIAGNOSIS — N183 Chronic kidney disease, stage 3 (moderate): Secondary | ICD-10-CM | POA: Diagnosis not present

## 2016-02-02 DIAGNOSIS — R040 Epistaxis: Secondary | ICD-10-CM | POA: Insufficient documentation

## 2016-02-02 DIAGNOSIS — C800 Disseminated malignant neoplasm, unspecified: Secondary | ICD-10-CM | POA: Insufficient documentation

## 2016-02-02 DIAGNOSIS — J209 Acute bronchitis, unspecified: Secondary | ICD-10-CM | POA: Diagnosis not present

## 2016-02-02 DIAGNOSIS — D696 Thrombocytopenia, unspecified: Secondary | ICD-10-CM | POA: Diagnosis not present

## 2016-02-02 DIAGNOSIS — Z87891 Personal history of nicotine dependence: Secondary | ICD-10-CM | POA: Insufficient documentation

## 2016-02-02 DIAGNOSIS — M79662 Pain in left lower leg: Secondary | ICD-10-CM | POA: Diagnosis not present

## 2016-02-02 DIAGNOSIS — D72819 Decreased white blood cell count, unspecified: Secondary | ICD-10-CM | POA: Diagnosis not present

## 2016-02-02 DIAGNOSIS — Z79899 Other long term (current) drug therapy: Secondary | ICD-10-CM | POA: Diagnosis not present

## 2016-02-02 HISTORY — DX: Malignant (primary) neoplasm, unspecified: C80.1

## 2016-02-02 LAB — CBC WITH DIFFERENTIAL/PLATELET
BASOS ABS: 0 10*3/uL (ref 0–0.1)
Basophils Relative: 1 %
EOS ABS: 0.1 10*3/uL (ref 0–0.7)
Eosinophils Relative: 7 %
HCT: 36.1 % — ABNORMAL LOW (ref 40.0–52.0)
HEMOGLOBIN: 11.6 g/dL — AB (ref 13.0–18.0)
Lymphocytes Relative: 17 %
Lymphs Abs: 0.3 10*3/uL — ABNORMAL LOW (ref 1.0–3.6)
MCH: 25.2 pg — ABNORMAL LOW (ref 26.0–34.0)
MCHC: 32 g/dL (ref 32.0–36.0)
MCV: 78.7 fL — ABNORMAL LOW (ref 80.0–100.0)
MONO ABS: 0.2 10*3/uL (ref 0.2–1.0)
Monocytes Relative: 13 %
NEUTROS PCT: 62 %
Neutro Abs: 1.3 10*3/uL — ABNORMAL LOW (ref 1.4–6.5)
Platelets: 47 10*3/uL — ABNORMAL LOW (ref 150–440)
RBC: 4.59 MIL/uL (ref 4.40–5.90)
RDW: 20 % — ABNORMAL HIGH (ref 11.5–14.5)
WBC: 1.9 10*3/uL — AB (ref 3.8–10.6)

## 2016-02-02 LAB — INFLUENZA PANEL BY PCR (TYPE A & B)
INFLAPCR: NEGATIVE
INFLBPCR: NEGATIVE

## 2016-02-02 LAB — BASIC METABOLIC PANEL
Anion gap: 6 (ref 5–15)
BUN: 16 mg/dL (ref 6–20)
CALCIUM: 8.1 mg/dL — AB (ref 8.9–10.3)
CO2: 30 mmol/L (ref 22–32)
CREATININE: 1.06 mg/dL (ref 0.61–1.24)
Chloride: 101 mmol/L (ref 101–111)
Glucose, Bld: 196 mg/dL — ABNORMAL HIGH (ref 65–99)
Potassium: 4.2 mmol/L (ref 3.5–5.1)
SODIUM: 137 mmol/L (ref 135–145)

## 2016-02-02 MED ORDER — ALBUTEROL SULFATE HFA 108 (90 BASE) MCG/ACT IN AERS: 2.0000 | INHALATION_SPRAY | RESPIRATORY_TRACT | 0 refills | Status: DC | PRN

## 2016-02-02 MED ORDER — AZITHROMYCIN 500 MG PO TABS
500.0000 mg | ORAL_TABLET | Freq: Once | ORAL | Status: AC
  Administered 2016-02-02: 500 mg via ORAL
  Filled 2016-02-02: qty 1

## 2016-02-02 MED ORDER — GUAIFENESIN 100 MG/5ML PO SOLN: 5.0000 mL | ORAL | 0 refills | Status: DC | PRN

## 2016-02-02 MED ORDER — AZITHROMYCIN 250 MG PO TABS: ORAL_TABLET | ORAL | 0 refills | Status: DC

## 2016-02-02 MED ORDER — METHYLPREDNISOLONE SODIUM SUCC 125 MG IJ SOLR
80.0000 mg | Freq: Once | INTRAMUSCULAR | Status: AC
Start: 2016-02-02 — End: 2016-02-02
  Administered 2016-02-02: 80 mg via INTRAVENOUS
  Filled 2016-02-02: qty 2

## 2016-02-02 MED ORDER — IPRATROPIUM-ALBUTEROL 0.5-2.5 (3) MG/3ML IN SOLN
3.0000 mL | Freq: Once | RESPIRATORY_TRACT | Status: AC
Start: 2016-02-02 — End: 2016-02-02
  Administered 2016-02-02: 3 mL via RESPIRATORY_TRACT
  Filled 2016-02-02: qty 3

## 2016-02-02 MED ORDER — IOPAMIDOL (ISOVUE-370) INJECTION 76%
75.0000 mL | Freq: Once | INTRAVENOUS | Status: AC | PRN
  Administered 2016-02-02: 75 mL via INTRAVENOUS

## 2016-02-02 MED ORDER — ALBUTEROL SULFATE (2.5 MG/3ML) 0.083% IN NEBU
5.0000 mg | INHALATION_SOLUTION | Freq: Once | RESPIRATORY_TRACT | Status: AC
  Administered 2016-02-02: 5 mg via RESPIRATORY_TRACT
  Filled 2016-02-02: qty 6

## 2016-02-02 NOTE — ED Provider Notes (Signed)
[facility] Emergency Department Provider Note  ____________________________________________  Time seen: Approximately 5:29 PM  I have reviewed the triage vital signs and the nursing notes.   HISTORY  Chief Complaint Abnormal Lab    HPI Hayden Martin is a 82 y.o. male who complains of mild shortness of breath and productive cough and was sent to the ED with concern for low blood counts. He has a history of lymphoma and splenic mass, had his first chemotherapy treatment about 10 days ago.Has a history of DVT in the left lower extremity for which she is receiving Lovenox injections. Denies chest pain  Past Medical History:  Diagnosis Date  . Cancer ([place])   . Clotting disorder ([place])   . Diabetes mellitus without complication ([place])   . Heart disease   . Hypertension      Patient Active Problem List   Diagnosis Date Noted  . Pleural effusion 02/05/2016  . Chronic kidney disease, stage III (moderate)   . DNR (do not resuscitate) discussion   . Palliative care by specialist   . 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 Tumor lysis syndrome 01/15/2016  . Splenic mass 01/14/2016  . Adenopathy 01/14/2016     Past Surgical History:  Procedure Laterality Date  . HERNIA REPAIR    . PERIPHERAL VASCULAR CATHETERIZATION N/A 01/18/2016   Procedure: IVC Filter Insertion;  Surgeon: Katha Cabal, MD;  Location: [place] [facility];  Service: Cardiovascular;  Laterality: N/A;  . PERIPHERAL VASCULAR CATHETERIZATION N/A 01/21/2016   Procedure: Glori Luis Cath Insertion;  Surgeon: Algernon Huxley, MD;  Location: [place] [facility];  Service: Cardiovascular;  Laterality: N/A;     Prior to Admission medications   Medication Sig Start Date End Date Taking? Authorizing Provider  allopurinol (ZYLOPRIM) 300 MG tablet Take 1 tablet (300 mg total) by mouth daily. 01/29/16  Yes Richard Leslye Peer, MD  carvedilol (COREG) 6.25 MG tablet TAKE ONE (1) TABLET BY MOUTH  TWO (2) TIMES DAILY 02/05/15  Yes Historical Provider, MD  dronabinol (MARINOL) 2.5 MG capsule Take 1 capsule (2.5 mg total) by mouth 2 (two) times daily before lunch and supper. 01/28/16  Yes Richard Leslye Peer, MD  enoxaparin (LOVENOX) 30 MG/0.3ML injection Inject 0.9 mLs (90 mg total) into the skin every 12 (twelve) hours. 01/28/16  Yes Loletha Grayer, MD  insulin aspart (NOVOLOG) 100 UNIT/ML FlexPen Inject 8 Units into the skin 3 (three) times daily with meals.    Yes Historical Provider, MD  Insulin Glargine (LANTUS Keith) Inject into the skin. Inject 35 units subcutaneously at bedtime.   Yes Historical Provider, MD  polyethylene glycol (MIRALAX / GLYCOLAX) packet Take 17 g by mouth daily as needed for moderate constipation. 01/28/16  Yes Loletha Grayer, MD  albuterol (PROVENTIL HFA) 108 (90 Base) MCG/ACT inhaler Inhale 2 puffs into the lungs every 4 (four) hours as needed for wheezing or shortness of breath. 02/02/16   Carrie Mew, MD  ALPRAZolam Duanne Moron) 0.25 MG tablet Take 1 tablet (0.25 mg total) by mouth every 6 (six) hours as needed for anxiety. 01/28/16   Loletha Grayer, MD  azithromycin (ZITHROMAX Z-PAK) 250 MG tablet Take 2 tablets (500 mg) on  Day 1,  followed by 1 tablet (250 mg) once daily on Days 2 through 5. 02/02/16   Carrie Mew, MD  guaiFENesin (ROBITUSSIN) 100 MG/5ML SOLN Take 5 mLs (100 mg total) by mouth every 4 (four) hours as needed for cough or to loosen phlegm. 02/02/16  Carrie Mew, MD  ketoconazole (NIZORAL) 2 % cream APPLY AT BEDTIME DAILY TO AFFECTED AREAS 12/27/14   Historical Provider, MD     Allergies Codeine and Penicillins   No family history on file.  Social History Social History  Substance Use Topics  . Smoking status: Former Smoker    Packs/day: 1.50    Quit date: 01/01/1986  . Smokeless tobacco: Former Systems developer     Comment: Smoked for 20 years  . Alcohol use Not on file    Review of Systems  Constitutional:   No fever or chills.  ENT:    No sore throat. No rhinorrhea. Cardiovascular:   No chest pain. Respiratory:   Positive shortness of breath and productive cough Gastrointestinal:   Negative for abdominal pain, vomiting and diarrhea.  Genitourinary:   Negative for dysuria or difficulty urinating. Musculoskeletal:   Positive left lower extremity swelling and pain, nonacute Neurological:   Negative for headaches 10-point ROS otherwise negative.  ____________________________________________   PHYSICAL EXAM:  VITAL SIGNS: ED Triage Vitals [02/02/16 1501]  Enc Vitals Group     BP (!) 155/76     Pulse Rate 85     Resp 18     Temp 98.1 F (36.7 C)     Temp Source Oral     SpO2 90 %     Weight 211 lb (95.7 kg)     Height 5\' 11"  (1.803 m)     Head Circumference      [facility]      Pain Score      Pain Loc      Pain Edu?      Excl. in [place]?     Vital signs reviewed, nursing assessments reviewed.   Constitutional:   Alert and oriented. Well appearing and in no distress. Eyes:   No scleral icterus. No conjunctival pallor. PERRL. EOMI.  No nystagmus. ENT   Head:   Normocephalic and atraumatic.   Nose:   No congestion/rhinnorhea. No septal hematoma. There is slow bleeding from the mucosa of the left anterior naris which started in the treatment room during exam after the patient blew his nose..   Mouth/Throat:   MMM, no pharyngeal erythema. No peritonsillar mass. No blood in the oropharynx   Neck:   No stridor. No SubQ emphysema. No meningismus. Hematological/Lymphatic/Immunilogical:   No cervical lymphadenopathy. Cardiovascular:   RRR. Symmetric bilateral radial and DP pulses.  No murmurs.  Respiratory:   Frequent coughing provoked by deep breathing. Expiratory wheezes. Bibasilar crackles. Symmetric breath sounds. Gastrointestinal:   Soft and nontender. Non distended. There is no CVA tenderness.  No rebound, rigidity, or guarding. Genitourinary:   deferred Musculoskeletal:   Nontender with normal range  of motion in all extremities. No joint effusions.  Mild left lower extremity tenderness, trace pitting edema bilaterally which is symmetric. Neurologic:   Normal speech and language.  CN 2-10 normal. Normal gait Motor grossly intact. No gross focal neurologic deficits are appreciated.  Skin:    Skin is warm, dry and intact. No rash noted.  No petechiae, purpura, or bullae.  ____________________________________________    LABS (pertinent positives/negatives) (all labs ordered are listed, but only abnormal results are displayed) Labs Reviewed  BASIC METABOLIC PANEL - Abnormal; Notable for the following:       Result Value   Glucose, Bld 196 (*)    Calcium 8.1 (*)    All other components within normal limits  CBC WITH DIFFERENTIAL/PLATELET - Abnormal; Notable for the following:  WBC 1.9 (*)    Hemoglobin 11.6 (*)    HCT 36.1 (*)    MCV 78.7 (*)    MCH 25.2 (*)    RDW 20.0 (*)    Platelets 47 (*)    Neutro Abs 1.3 (*)    Lymphs Abs 0.3 (*)    All other components within normal limits  INFLUENZA PANEL BY PCR (TYPE A & B, H1N1)   ____________________________________________   EKG    ____________________________________________    RADIOLOGY  Chest x-ray reveals small bilateral pleural effusions, unchanged from previous. Left basilar atelectasis versus infiltrate.  ____________________________________________   PROCEDURES Procedures  ____________________________________________   INITIAL IMPRESSION / ASSESSMENT AND PLAN / ED COURSE  Pertinent labs & imaging results that were available during my care of the patient were reviewed by me and considered in my medical decision making (see chart for details).  Patient well appearing, not in distress, presents with shortness of breath productive cough and wheezing with borderline low oxygen saturation at 90%. For this we will give nebs and Solu-Medrol and reassess.  For epistaxis, we will recheck his labs including  platelets, nose clamp in place due to the anterior location of bleeding.     Clinical Course as of Feb 07 716  Sat Feb 02, 2016  1748 Finished nebs. W/u essentially unremarkable.  Still 90% RA.  - remove nose clamp, check for hemostasis.  - ambulate, monitor so2.  - CTA chest given cancer, known LLE DVT with now dyspnea and hypoxia with nondiagnostic cxr.  [PS]    Clinical Course User Index [PS] Carrie Mew, MD      ----------------------------------------- 7:18 AM on 02/07/2016 -----------------------------------------  This is a late entry note. The patient was found in the workup to have essentially all chronic findings. He was able to ambulate without any desaturation, maintaining his sats around 94%. Felt better. Hemostatic better discharged to follow up with primary care. ____________________________________________   FINAL CLINICAL IMPRESSION(S) / ED DIAGNOSES  Final diagnoses:  Acute bronchitis, unspecified organism  Anterior epistaxis  Leukopenia, unspecified type  Thrombocytopenia ([place])       Portions of this note were generated with dragon dictation software. Dictation errors may occur despite best attempts at proofreading.    Carrie Mew, MD 02/07/16 [phone]

## 2016-02-02 NOTE — ED Notes (Signed)
Patient transported to X-ray 

## 2016-02-02 NOTE — ED Triage Notes (Signed)
Pt arrives to ER via [facility] from [facility]. C/o low platelets and nonproductive cough. Pt denies pain. CBG 238

## 2016-02-02 NOTE — Discharge Instructions (Signed)
Our lab tests today showed a WBC of 1.9 and a platelet count of 47.  This does not warrant any immediate intervention. Follow up with your primary care doctor in 2 days for monitoring of these blood counts.  Take albuterol and antibiotics as prescribed.

## 2016-02-02 NOTE — ED Notes (Signed)
Pt is in good condition; discharge instructions reviewed, follow up care and home care reviewed, prescription medication reviewed; pt and family verbalized understanding; pt is going to nursing home being driven there by family; pt is leaving ED via wheel chair

## 2016-02-02 NOTE — ED Notes (Signed)
Patient transported to CT 

## 2016-02-02 NOTE — ED Notes (Signed)
Patient ambulated around room while checking oxygen saturation.  O2 sats maintained at 92% RA.

## 2016-02-04 ENCOUNTER — Telehealth: Payer: Self-pay | Admitting: *Deleted

## 2016-02-04 ENCOUNTER — Other Ambulatory Visit: Payer: Self-pay | Admitting: *Deleted

## 2016-02-04 DIAGNOSIS — C8580 Other specified types of non-Hodgkin lymphoma, unspecified site: Secondary | ICD-10-CM

## 2016-02-04 NOTE — Telephone Encounter (Signed)
Called Bonnie at [facility] and told her that we would like to have pt. Come over tom. And check labs and see md. He had chemo in hospital and we need to check his labs and get set up for next treatment.. She will go tell staff to work out transportation for tom. 9 am labs and 9:15 to see md.

## 2016-02-05 ENCOUNTER — Inpatient Hospital Stay: Payer: No Typology Code available for payment source | Attending: Hematology and Oncology

## 2016-02-05 ENCOUNTER — Ambulatory Visit
Admission: RE | Admit: 2016-02-05 | Discharge: 2016-02-05 | Disposition: A | Discharge status: Home / Self Care | Payer: Medicare Other | Source: Ambulatory Visit | Attending: Hematology and Oncology | Admitting: Hematology and Oncology

## 2016-02-05 ENCOUNTER — Inpatient Hospital Stay (HOSPITAL_BASED_OUTPATIENT_CLINIC_OR_DEPARTMENT_OTHER): Payer: No Typology Code available for payment source | Admitting: Hematology and Oncology

## 2016-02-05 ENCOUNTER — Encounter: Payer: Self-pay | Admitting: Hematology and Oncology

## 2016-02-05 VITALS — BP 112/75 | HR 76 | Temp 97.5°F | Resp 18 | Wt 204.2 lb

## 2016-02-05 DIAGNOSIS — J90 Pleural effusion, not elsewhere classified: Secondary | ICD-10-CM | POA: Insufficient documentation

## 2016-02-05 DIAGNOSIS — R5383 Other fatigue: Secondary | ICD-10-CM | POA: Diagnosis not present

## 2016-02-05 DIAGNOSIS — J209 Acute bronchitis, unspecified: Secondary | ICD-10-CM | POA: Diagnosis not present

## 2016-02-05 DIAGNOSIS — I82432 Acute embolism and thrombosis of left popliteal vein: Secondary | ICD-10-CM | POA: Insufficient documentation

## 2016-02-05 DIAGNOSIS — Z87891 Personal history of nicotine dependence: Secondary | ICD-10-CM | POA: Diagnosis not present

## 2016-02-05 DIAGNOSIS — Z7901 Long term (current) use of anticoagulants: Secondary | ICD-10-CM

## 2016-02-05 DIAGNOSIS — Z5111 Encounter for antineoplastic chemotherapy: Secondary | ICD-10-CM | POA: Diagnosis not present

## 2016-02-05 DIAGNOSIS — Z79899 Other long term (current) drug therapy: Secondary | ICD-10-CM

## 2016-02-05 DIAGNOSIS — R188 Other ascites: Secondary | ICD-10-CM | POA: Diagnosis not present

## 2016-02-05 DIAGNOSIS — N400 Enlarged prostate without lower urinary tract symptoms: Secondary | ICD-10-CM | POA: Diagnosis not present

## 2016-02-05 DIAGNOSIS — R161 Splenomegaly, not elsewhere classified: Secondary | ICD-10-CM | POA: Insufficient documentation

## 2016-02-05 DIAGNOSIS — M7989 Other specified soft tissue disorders: Secondary | ICD-10-CM

## 2016-02-05 DIAGNOSIS — I129 Hypertensive chronic kidney disease with stage 1 through stage 4 chronic kidney disease, or unspecified chronic kidney disease: Secondary | ICD-10-CM | POA: Diagnosis not present

## 2016-02-05 DIAGNOSIS — I824Z2 Acute embolism and thrombosis of unspecified deep veins of left distal lower extremity: Secondary | ICD-10-CM | POA: Diagnosis not present

## 2016-02-05 DIAGNOSIS — E1122 Type 2 diabetes mellitus with diabetic chronic kidney disease: Secondary | ICD-10-CM

## 2016-02-05 DIAGNOSIS — C8338 Diffuse large B-cell lymphoma, lymph nodes of multiple sites: Secondary | ICD-10-CM

## 2016-02-05 DIAGNOSIS — Z7689 Persons encountering health services in other specified circumstances: Secondary | ICD-10-CM

## 2016-02-05 DIAGNOSIS — D689 Coagulation defect, unspecified: Secondary | ICD-10-CM

## 2016-02-05 DIAGNOSIS — N189 Chronic kidney disease, unspecified: Secondary | ICD-10-CM

## 2016-02-05 DIAGNOSIS — Z794 Long term (current) use of insulin: Secondary | ICD-10-CM | POA: Diagnosis not present

## 2016-02-05 DIAGNOSIS — I519 Heart disease, unspecified: Secondary | ICD-10-CM | POA: Diagnosis not present

## 2016-02-05 DIAGNOSIS — C8580 Other specified types of non-Hodgkin lymphoma, unspecified site: Secondary | ICD-10-CM

## 2016-02-05 LAB — COMPREHENSIVE METABOLIC PANEL
ALT: 87 U/L — ABNORMAL HIGH (ref 17–63)
AST: 40 U/L (ref 15–41)
Albumin: 2.7 g/dL — ABNORMAL LOW (ref 3.5–5.0)
Alkaline Phosphatase: 80 U/L (ref 38–126)
Anion gap: 5 (ref 5–15)
BUN: 16 mg/dL (ref 6–20)
CO2: 30 mmol/L (ref 22–32)
Calcium: 7.7 mg/dL — ABNORMAL LOW (ref 8.9–10.3)
Chloride: 103 mmol/L (ref 101–111)
Creatinine, Ser: 1.13 mg/dL (ref 0.61–1.24)
GFR calc Af Amer: >60 mL/min (ref >60)
GFR calc non Af Amer: 59 mL/min — ABNORMAL LOW (ref >60)
Glucose, Bld: 144 mg/dL — ABNORMAL HIGH (ref 65–99)
Potassium: 3.8 mmol/L (ref 3.5–5.1)
Sodium: 138 mmol/L (ref 135–145)
Total Bilirubin: 0.7 mg/dL (ref 0.3–1.2)
Total Protein: 5.2 g/dL — ABNORMAL LOW (ref 6.5–8.1)

## 2016-02-05 LAB — CBC WITH DIFFERENTIAL/PLATELET
Basophils Absolute: 0 10*3/uL (ref 0–0.1)
Basophils Relative: 1 %
Eosinophils Absolute: 0.2 10*3/uL (ref 0–0.7)
Eosinophils Relative: 5 %
HCT: 33.5 % — ABNORMAL LOW (ref 40.0–52.0)
Hemoglobin: 10.8 g/dL — ABNORMAL LOW (ref 13.0–18.0)
Lymphocytes Relative: 15 %
Lymphs Abs: 0.4 10*3/uL — ABNORMAL LOW (ref 1.0–3.6)
MCH: 25.4 pg — ABNORMAL LOW (ref 26.0–34.0)
MCHC: 32.3 g/dL (ref 32.0–36.0)
MCV: 78.6 fL — ABNORMAL LOW (ref 80.0–100.0)
Monocytes Absolute: 0.4 10*3/uL (ref 0.2–1.0)
Monocytes Relative: 14 %
Neutro Abs: 2 10*3/uL (ref 1.4–6.5)
Neutrophils Relative %: 65 %
Platelets: 79 10*3/uL — ABNORMAL LOW (ref 150–440)
RBC: 4.26 MIL/uL — ABNORMAL LOW (ref 4.40–5.90)
RDW: 20.5 % — ABNORMAL HIGH (ref 11.5–14.5)
WBC: 3 10*3/uL — ABNORMAL LOW (ref 3.8–10.6)

## 2016-02-05 LAB — URIC ACID: Uric Acid, Serum: 3.2 mg/dL — ABNORMAL LOW (ref 4.4–7.6)

## 2016-02-05 LAB — LACTATE DEHYDROGENASE: LDH: 280 U/L — ABNORMAL HIGH (ref 98–192)

## 2016-02-05 NOTE — Progress Notes (Signed)
[facility] day:  02/05/2016   Chief Complaint: Hayden Martin is a 82 y.o. 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  He was felt to have lymphoma.  Labs were drawn.  CBC revealed a platelet count of 99,000.  CMP revealed a potassium of 5.5, BUN 46, and creatinine 2.34 (baseline 1.3-1.4).  LDH was 709.  Uric acid was 12.7.  PSA was 6.97.  He was admitted to the hospital for management of tumor lysis syndrome and an expedited work-up.    He was admitted to [facility] from 01/15/2016 - 01/28/2016.  PET scan on 01/16/2016 revealed extensive hypermetabolic adenopathy within the neck, chest, abdomen and pelvis.  The spleen was enlarged with multi focal hypermetabolic splenic lesions  There was evidence of transperitoneal spread of tumor within the upper abdomen.  There were bilateral pleural effusions.  Bilateral lower extremity duplex on 01/16/2016 revealed DVT in the left lower extremity involving the calf veins and the left popliteal vein.  Clot burden was greater than in 2014 suggesting acute thrombus.  He received Eliquis on 01/16/2016.  He was placed on heparin.  IVC filter was placed on 01/18/2016.  Hepatitis serologies negative (in anticipation of Rituxan).  G6PD testing negative.  Echo on 01/18/2016 revealed an EF of 55-60%.  Port-a-cath was placed on 01/21/2016.  CT-guided retroperitoneal lymph node biopsy and bone marrow were performed on 01/21/2016. Pathology revealed a CD30 positive large B-cell lymphoma. Immunohistochemical studies were positive for CD20, CD30, BCL-2, BCL 6, Ki-67 (> 90%) and negative for CD10 and cyclin D1.  Bone marrow revealed no evidence of lymphoma.  Marrow was mildly hypercellular for age (40-50 %) with erythroid  predominant trilineage hematopoiesis, mild nonspecific dyserythropoiesis and mild megakaryocytic atypia. There was diffuse mild reticulin fibrosis. There were increased iron stores. Flow cytometry was negative.  Cytogenetics were normal (46, XY).  He received mini-RCHOP on 01/23/2016 and Rituxan on 01/24/2016.  He received GCSF for a few days. Labs on discharge included a hematocrit 35.5, hemoglobin 11.5, platelets 90,000, white count 15,000.  Creatinine was 1.5.  Uric acid was 5.3 on 01/27/2016.  He was discharged on Lovenox injections.  He is staying at [facility].  The patient was seen in the emergency room on 02/02/2016 with shortness of breath and a productive cough.  Chest CT angiogram revealed no evidence of pulmonary emboli. There was moderate size left pleural effusion and small to moderate right-sided pleural effusion. There are worse there were stable small amount of ascites and stable multiple splenic masses upper abdominal adenopathy was improving. Labs included a hematocrit 36.1, hemoglobin 11.6, platelets 47,000, white count 1900 with an ANC of 1300. Creatinine was 1.06.  Symptomatically, he notes fatigue.  He was diagnosed with bronchitis and is on albuterol and antibiotics (? azithromycin).  He states that if he leans back, he chokes (shortness of breath).  Eating is "ok".  He denies any nausea, vomiting or diarrhea.  He denies any melena or hematochezia.  Physical therapy is helping him to walk.  Feet are swollen.     Past Medical History:  Diagnosis Date  . Cancer ([place])   . Clotting disorder ([place])   . Diabetes mellitus without complication ([place])   . Heart disease   . Hypertension  Past Surgical History:  Procedure Laterality Date  . HERNIA REPAIR    . PERIPHERAL VASCULAR CATHETERIZATION N/A 01/18/2016   Procedure: IVC Filter Insertion;  Surgeon: Katha Cabal, MD;  Location: [facility];  Service: Cardiovascular;  Laterality: N/A;  . PERIPHERAL VASCULAR  CATHETERIZATION N/A 01/21/2016   Procedure: Glori Luis Cath Insertion;  Surgeon: Algernon Huxley, MD;  Location: [facility];  Service: Cardiovascular;  Laterality: N/A;    History reviewed. No pertinent family history.  Social History:  reports that he quit smoking about 30 years ago. He smoked 1.50 packs per day. He has quit using smokeless tobacco. His alcohol and drug histories are not on file.  He was in the [employer].  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  He is currently living at [facility] for rehabiliation.  He has 3 children who live locally.  The patient is accompanied by his son, [name], today.  Allergies:  Allergies  Allergen Reactions  . Codeine Itching  . Penicillins Itching    Current Medications: Current Outpatient Prescriptions  Medication Sig Dispense Refill  . albuterol (PROVENTIL HFA) 108 (90 Base) MCG/ACT inhaler Inhale 2 puffs into the lungs every 4 (four) hours as needed for wheezing or shortness of breath. 1 Inhaler 0  . allopurinol (ZYLOPRIM) 300 MG tablet Take 1 tablet (300 mg total) by mouth daily. 30 tablet 0  . ALPRAZolam (XANAX) 0.25 MG tablet Take 1 tablet (0.25 mg total) by mouth every 6 (six) hours as needed for anxiety. 12 tablet 0  . azithromycin (ZITHROMAX Z-PAK) 250 MG tablet Take 2 tablets (500 mg) on  Day 1,  followed by 1 tablet (250 mg) once daily on Days 2 through 5. 6 each 0  . carvedilol (COREG) 6.25 MG tablet TAKE ONE (1) TABLET BY MOUTH TWO (2) TIMES DAILY    . dronabinol (MARINOL) 2.5 MG capsule Take 1 capsule (2.5 mg total) by mouth 2 (two) times daily before lunch and supper. 60 capsule 0  . enoxaparin (LOVENOX) 30 MG/0.3ML injection Inject 0.9 mLs (90 mg total) into the skin every 12 (twelve) hours. 60 Syringe 0  . guaiFENesin (ROBITUSSIN) 100 MG/5ML SOLN Take 5 mLs (100 mg total) by mouth every 4 (four) hours as needed for cough or to loosen phlegm. 120 mL 0  . insulin aspart (NOVOLOG) 100 UNIT/ML FlexPen Inject  8 Units into the skin 3 (three) times daily with meals.     . Insulin Glargine (LANTUS [place]) Inject into the skin. Inject 35 units subcutaneously at bedtime.    [name] ketoconazole (NIZORAL) 2 % cream APPLY AT BEDTIME DAILY TO AFFECTED AREAS    . polyethylene glycol (MIRALAX / GLYCOLAX) packet Take 17 g by mouth daily as needed for moderate constipation. 14 each 0   No current facility-administered medications for this visit.     Review of Systems:  GENERAL:  Fatigue.  No fevers or sweats.  Weight up 3 pounds since last visit. PERFORMANCE STATUS (ECOG):  2-3 HEENT:  No visual changes, runny nose, sore throat, mouth sores or tenderness. Lungs: Shortness of breath on exertion.  Productive cough with green sputum.  Bronchitis on antibiotics and albuterol.  No hemoptysis. Cardiac:  No chest pain, palpitations, or PND.  Unable to lean back secondary to shortness of breath. GI:  Eating"ok".  No vomiting, diarrhea, constipation, melena or hematochezia. GU:  No urgency, frequency, dysuria, or hematuria. Musculoskeletal:  Back issues.  o muscle tenderness.  Extremities:  No pain or swelling. Skin:  No rashes or skin changes. Neuro:  No headache, numbness or weakness, or coordination issues.  Balance issues. Endocrine:  Diabetes.  No thyroid issues, hot flashes or night sweats. Psych:  No mood changes, depression or anxiety. Pain:  No focal pain. Review of systems:  All other systems reviewed and found to be negative.  Physical Exam: Blood pressure 112/75, pulse 76, temperature 97.5 F (36.4 C), temperature source Tympanic, resp. rate 18, weight 204 lb 4 oz (92.6 kg). GENERAL:  Elderly gentleman sitting comfortably in a wheelchair in the exam room in no acute distress.  MENTAL STATUS:  Alert and oriented to person, place and time. HEAD:  Wearing an Scientist, clinical (histocompatibility and immunogenetics).  Gray hair.  Male pattern baldness.  Normocephalic, atraumatic, face symmetric, no Cushingoid features. EYES:  Blue eyes.  Pupils  equal round and reactive to light and accomodation.  No conjunctivitis or scleral icterus. ENT:  Hearing aide.  Oropharynx clear without lesion.  Tongue normal. Mucous membranes moist.  RESPIRATORY:  Decreased breath sounds lower lobes (1/3 up on left; left  > right).  Clear to auscultation without rales, wheezes or rhonchi. CARDIOVASCULAR:  Regular rate and rhythm without murmur, rub or gallop. ABDOMEN:  Soft, non-tender, with active bowel sounds, and no hepatomegaly.  Left upper quadrant fullness.  No masses. SKIN:  No rashes, ulcers or lesions. EXTREMITIES: Bilateral lower extremity edema below the knee (left > right).  No skin discoloration or tenderness.  No palpable cords. LYMPH NODES: No palpable cervical, supraclavicular, axillary or inguinal adenopathy  NEUROLOGICAL: Unremarkable. PSYCH:  Appropriate.   Appointment on 02/05/2016  Component Date Value Ref Range Status  . WBC 02/05/2016 3.0* 3.8 - 10.6 K/uL Final  . RBC 02/05/2016 4.26* 4.40 - 5.90 MIL/uL Final  . Hemoglobin 02/05/2016 10.8* 13.0 - 18.0 g/dL Final  . HCT 02/05/2016 33.5* 40.0 - 52.0 % Final  . MCV 02/05/2016 78.6* 80.0 - 100.0 fL Final  . MCH 02/05/2016 25.4* 26.0 - 34.0 pg Final  . MCHC 02/05/2016 32.3  32.0 - 36.0 g/dL Final  . RDW 02/05/2016 20.5* 11.5 - 14.5 % Final  . Platelets 02/05/2016 79* 150 - 440 K/uL Final  . Neutrophils Relative % 02/05/2016 65  % Final  . Neutro Abs 02/05/2016 2.0  1.4 - 6.5 K/uL Final  . Lymphocytes Relative 02/05/2016 15  % Final  . Lymphs Abs 02/05/2016 0.4* 1.0 - 3.6 K/uL Final  . Monocytes Relative 02/05/2016 14  % Final  . Monocytes Absolute 02/05/2016 0.4  0.2 - 1.0 K/uL Final  . Eosinophils Relative 02/05/2016 5  % Final  . Eosinophils Absolute 02/05/2016 0.2  0 - 0.7 K/uL Final  . Basophils Relative 02/05/2016 1  % Final  . Basophils Absolute 02/05/2016 0.0  0 - 0.1 K/uL Final  . Sodium 02/05/2016 138  135 - 145 mmol/L Final  . Potassium 02/05/2016 3.8  3.5 - 5.1 mmol/L  Final  . Chloride 02/05/2016 103  101 - 111 mmol/L Final  . CO2 02/05/2016 30  22 - 32 mmol/L Final  . Glucose, Bld 02/05/2016 144* 65 - 99 mg/dL Final  . BUN 02/05/2016 16  6 - 20 mg/dL Final  . Creatinine, Ser 02/05/2016 1.13  0.61 - 1.24 mg/dL Final  . Calcium 02/05/2016 7.7* 8.9 - 10.3 mg/dL Final  . Total Protein 02/05/2016 5.2* 6.5 - 8.1 g/dL Final  . Albumin 02/05/2016 2.7* 3.5 - 5.0 g/dL Final  . AST 02/05/2016 40  15 - 41 U/L Final  . ALT 02/05/2016 87* 17 - 63 U/L Final  . Alkaline Phosphatase 02/05/2016 80  38 - 126 U/L Final  . Total Bilirubin 02/05/2016 0.7  0.3 - 1.2 mg/dL Final  . GFR calc non Af Amer 02/05/2016 59* >60 mL/min Final  . GFR calc Af Amer 02/05/2016 >60  >60 mL/min Final   Comment: (NOTE) The eGFR has been calculated using the CKD EPI equation. This calculation has not been validated in all clinical situations. eGFR's persistently <60 mL/min signify possible Chronic Kidney Disease.   . Anion gap 02/05/2016 5  5 - 15 Final  . LDH 02/05/2016 280* 98 - 192 U/L Final    Assessment:  KENNIEL BERGSMA is a 82 y.o. 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  He presented with a 30-35 pound weight loss in 2-3 months.  CT-guided retroperitoneal lymph node biopsy on 01/21/2016 revealed a CD30 positive large B-cell lymphoma. Immunohistochemical studies were positive for CD20, CD30, BCL-2, BCL 6, Ki-67 (> 90%) and negative for CD10 and cyclin D1.  Bone marrow biopsy on 01/21/2016 revealed no evidence of lymphoma.  Flow cytometry was negative.  Cytogenetics were normal (46, XY).  Abdomen and pelvic CT scan on 01/07/2016 revealed multiple solid masses within the spleen.  There was extensive retroperitoneal, portal and mesenteric lymphadenopathy.  Retrocrural node measured 2.1 x 2.0 cm.  Periportal node measured 2.6 x 5.8 cm. Confluent nodal mass is in the retroperitoneum surrounding the great vessels measured 5.6 x 3.8 cmon the left and 5.3 x 2.9 cm on the  right.  Left iliac node measured 1.7 x 2.6 cm.  There was possible pleural disease on the left.  The prostate was markedly enlarged.  There was no disease in the liver.  PET scan on 01/16/2016 revealed extensive hypermetabolic adenopathy within the neck, chest, abdomen and pelvis.  The spleen was enlarged with multi focal hypermetabolic splenic lesions  There was evidence of transperitoneal spread of tumor within the upper abdomen.  There were bilateral pleural effusions.  Hepatitis serologies were negative on 01/16/2016.  G6PD testing negative.  Echo on 01/18/2016 revealed an EF of 55-60%.   He is currently day 14 of cycle #1 mini-RCHOP (01/23/2016 with Rituxan on 01/24/2016).  Cycle #1 was complicated by tumor lysis syndrome.  He has a history of left lower extremity thrombosis.  He has had 2 clots 1 year apart and is on life long anticoagulation.  Bilateral lower extremity duplex on 01/16/2016 revealed DVT in the left lower extremity involving the calf veins and the left popliteal vein.  Clot burden was greater than in 2014 suggesting acute thrombus.  IVC filter was placed on 01/18/2016.  He was on Xarelto.  He is on Lovenox.  He has chronic renal insufficiency (Cr 1.3- 1.4).  Chest CT angiogram on 02/02/2016 revealed no evidence of pulmonary emboli. There were bilateral pleural effusions (moderate size left and small to moderate right).  Symptomatically, he has shortness of breath likely secondary to enlarging pleural effusions.  He has increased lower extremity edema.  Plan: 1.  Labs today:  CBC with diff, CMP, LDH, uric acid. 2.  Discuss events and results from hospitalization.  Discuss blood counts and plan for Neulasta. 3.  Discuss pleural effusion and possible therapeutic thoracentesis. 4.  Preauth Neulasta 5.  CXR (upright and decubitus) to assess effusions. 6.  Bilateral lower extremity duplex. 7.  Discuss use of Ted hose (on during the day and off at  night). 8.  RTC on 02/14/2016  for MD assessment, labs (CBC with diff, CMP, LDH, uric acid), cycle #2 mini-RCHOP.  Addendum:   Left sided decubitus on 02/05/2016 revealed a layering left sided effusion.  Bilateral lower extremity duplex on 02/05/2016 revealed partially recanalized subacute to chronic DVT in the left popliteal vein. Thrombus burden had decreased compared to 01/15/2016.  There was interval resolution of DVT from within the left calf veins.  Patient will be contacted for scheduling of a diagnostic and therapeutic left sided thoracentesis.   Lequita Asal, MD  02/05/2016, 10:05 AM

## 2016-02-05 NOTE — Progress Notes (Signed)
Patient added on to schedule today due to swelling in his lower extremities (right side worse/discolored).  Patient has had bronchitis and cannot lie back due to coughing and choking feeling. Patient c/o right arm also where IV was in his arm while in the ED over the weekend.  Patient on lovenox for DVT.

## 2016-02-06 ENCOUNTER — Other Ambulatory Visit: Payer: Self-pay | Admitting: *Deleted

## 2016-02-06 DIAGNOSIS — C8338 Diffuse large B-cell lymphoma, lymph nodes of multiple sites: Secondary | ICD-10-CM

## 2016-02-06 DIAGNOSIS — J90 Pleural effusion, not elsewhere classified: Secondary | ICD-10-CM

## 2016-02-07 ENCOUNTER — Encounter: Payer: Self-pay | Admitting: Interventional Radiology

## 2016-02-07 NOTE — Discharge Instructions (Signed)
Thoracentesis, Care After °Refer to this sheet in the next few weeks. These instructions provide you with information about caring for yourself after your procedure. Your health care provider may also give you more specific instructions. Your treatment has been planned according to current medical practices, but problems sometimes occur. Call your health care provider if you have any problems or questions after your procedure. °What can I expect after the procedure? °After your procedure, it is common to have pain at the puncture site. °Follow these instructions at home: °· Take medicines only as directed by your health care provider. °· You may return to your normal diet and normal activities as directed by your health care provider. °· Drink enough fluid to keep your urine clear or pale yellow. °· Do not take baths, swim, or use a hot tub until your health care provider approves. °· Follow your health care provider's instructions about: °¨ Puncture site care. °¨ Bandage (dressing) changes and removal. °· Check your puncture site every day for signs of infection. Watch for: °¨ Redness, swelling, or pain. °¨ Fluid, blood, or pus. °· Keep all follow-up visits as directed by your health care provider. This is important. °Contact a health care provider if: °· You have redness, swelling, or pain at your puncture site. °· You have fluid, blood, or pus coming from your puncture site. °· You have a fever. °· You have chills. °· You have nausea or vomiting. °· You have trouble breathing. °· You develop a worsening cough. °Get help right away if: °· You have extreme shortness of breath. °· You develop chest pain. °· You faint or feel light-headed. °This information is not intended to replace advice given to you by your health care provider. Make sure you discuss any questions you have with your health care provider. °Document Released: 03/10/2014 Document Revised: 10/20/2015 Document Reviewed: 11/29/2013 °Elsevier  Interactive Patient Education © 2017 [employer] ° °

## 2016-02-08 ENCOUNTER — Ambulatory Visit
Admission: RE | Admit: 2016-02-08 | Discharge: 2016-02-08 | Disposition: A | Discharge status: Home / Self Care | Payer: Medicare Other | Source: Ambulatory Visit | Attending: Hematology and Oncology | Admitting: Hematology and Oncology

## 2016-02-08 ENCOUNTER — Ambulatory Visit
Admission: RE | Admit: 2016-02-08 | Discharge: 2016-02-08 | Disposition: A | Discharge status: Home / Self Care | Payer: Medicare Other | Source: Ambulatory Visit | Attending: Interventional Radiology | Admitting: Interventional Radiology

## 2016-02-08 DIAGNOSIS — J9811 Atelectasis: Secondary | ICD-10-CM | POA: Insufficient documentation

## 2016-02-08 DIAGNOSIS — J90 Pleural effusion, not elsewhere classified: Secondary | ICD-10-CM | POA: Diagnosis present

## 2016-02-08 DIAGNOSIS — C8338 Diffuse large B-cell lymphoma, lymph nodes of multiple sites: Secondary | ICD-10-CM | POA: Diagnosis not present

## 2016-02-08 LAB — BODY FLUID CELL COUNT WITH DIFFERENTIAL
Eos, Fluid: 0 %
Lymphs, Fluid: 82 %
Monocyte-Macrophage-Serous Fluid: 10 %
Neutrophil Count, Fluid: 8 %
Other Cells, Fluid: 0 %
Total Nucleated Cell Count, Fluid: 311 cu mm

## 2016-02-08 LAB — PROTEIN, BODY FLUID: Total protein, fluid: <3 g/dL

## 2016-02-08 LAB — GLUCOSE, SEROUS FLUID: Glucose, Fluid: 158 mg/dL

## 2016-02-09 LAB — MISC LABCORP TEST (SEND OUT): Labcorp test code: 19588

## 2016-02-11 LAB — CYTOLOGY - NON PAP

## 2016-02-12 ENCOUNTER — Ambulatory Visit: Payer: Medicare Other | Admitting: Hematology and Oncology

## 2016-02-12 LAB — BODY FLUID CULTURE: Culture: NO GROWTH

## 2016-02-15 ENCOUNTER — Inpatient Hospital Stay: Payer: No Typology Code available for payment source

## 2016-02-15 ENCOUNTER — Other Ambulatory Visit: Payer: Self-pay | Admitting: *Deleted

## 2016-02-15 ENCOUNTER — Ambulatory Visit: Payer: Medicare Other | Admitting: Hematology and Oncology

## 2016-02-15 ENCOUNTER — Inpatient Hospital Stay (HOSPITAL_BASED_OUTPATIENT_CLINIC_OR_DEPARTMENT_OTHER): Payer: No Typology Code available for payment source | Admitting: Hematology and Oncology

## 2016-02-15 ENCOUNTER — Other Ambulatory Visit: Payer: Self-pay | Admitting: Hematology and Oncology

## 2016-02-15 VITALS — BP 114/74 | HR 72 | Temp 97.9°F | Resp 18 | Wt 200.3 lb

## 2016-02-15 DIAGNOSIS — J209 Acute bronchitis, unspecified: Secondary | ICD-10-CM

## 2016-02-15 DIAGNOSIS — R188 Other ascites: Secondary | ICD-10-CM

## 2016-02-15 DIAGNOSIS — Z87891 Personal history of nicotine dependence: Secondary | ICD-10-CM

## 2016-02-15 DIAGNOSIS — I82432 Acute embolism and thrombosis of left popliteal vein: Secondary | ICD-10-CM

## 2016-02-15 DIAGNOSIS — Z7689 Persons encountering health services in other specified circumstances: Secondary | ICD-10-CM | POA: Diagnosis not present

## 2016-02-15 DIAGNOSIS — E1122 Type 2 diabetes mellitus with diabetic chronic kidney disease: Secondary | ICD-10-CM

## 2016-02-15 DIAGNOSIS — C8338 Diffuse large B-cell lymphoma, lymph nodes of multiple sites: Secondary | ICD-10-CM

## 2016-02-15 DIAGNOSIS — R161 Splenomegaly, not elsewhere classified: Secondary | ICD-10-CM

## 2016-02-15 DIAGNOSIS — N400 Enlarged prostate without lower urinary tract symptoms: Secondary | ICD-10-CM

## 2016-02-15 DIAGNOSIS — Z794 Long term (current) use of insulin: Secondary | ICD-10-CM

## 2016-02-15 DIAGNOSIS — M7989 Other specified soft tissue disorders: Secondary | ICD-10-CM

## 2016-02-15 DIAGNOSIS — I824Z2 Acute embolism and thrombosis of unspecified deep veins of left distal lower extremity: Secondary | ICD-10-CM

## 2016-02-15 DIAGNOSIS — N189 Chronic kidney disease, unspecified: Secondary | ICD-10-CM

## 2016-02-15 DIAGNOSIS — J90 Pleural effusion, not elsewhere classified: Secondary | ICD-10-CM | POA: Diagnosis not present

## 2016-02-15 DIAGNOSIS — I129 Hypertensive chronic kidney disease with stage 1 through stage 4 chronic kidney disease, or unspecified chronic kidney disease: Secondary | ICD-10-CM

## 2016-02-15 DIAGNOSIS — Z79899 Other long term (current) drug therapy: Secondary | ICD-10-CM

## 2016-02-15 DIAGNOSIS — Z5111 Encounter for antineoplastic chemotherapy: Secondary | ICD-10-CM

## 2016-02-15 DIAGNOSIS — D689 Coagulation defect, unspecified: Secondary | ICD-10-CM

## 2016-02-15 DIAGNOSIS — R5383 Other fatigue: Secondary | ICD-10-CM

## 2016-02-15 DIAGNOSIS — I519 Heart disease, unspecified: Secondary | ICD-10-CM

## 2016-02-15 DIAGNOSIS — Z7901 Long term (current) use of anticoagulants: Secondary | ICD-10-CM

## 2016-02-15 LAB — CBC WITH DIFFERENTIAL/PLATELET
Basophils Absolute: 0.2 10*3/uL — ABNORMAL HIGH (ref 0–0.1)
Basophils Relative: 3 %
Eosinophils Absolute: 0.3 10*3/uL (ref 0–0.7)
Eosinophils Relative: 4 %
HCT: 30.2 % — ABNORMAL LOW (ref 40.0–52.0)
Hemoglobin: 9.9 g/dL — ABNORMAL LOW (ref 13.0–18.0)
Lymphocytes Relative: 10 %
Lymphs Abs: 0.7 10*3/uL — ABNORMAL LOW (ref 1.0–3.6)
MCH: 26.3 pg (ref 26.0–34.0)
MCHC: 32.8 g/dL (ref 32.0–36.0)
MCV: 80.3 fL (ref 80.0–100.0)
Monocytes Absolute: 1 10*3/uL (ref 0.2–1.0)
Monocytes Relative: 14 %
Neutro Abs: 5 10*3/uL (ref 1.4–6.5)
Neutrophils Relative %: 69 %
Platelets: 248 10*3/uL (ref 150–440)
RBC: 3.76 MIL/uL — ABNORMAL LOW (ref 4.40–5.90)
RDW: 23.5 % — ABNORMAL HIGH (ref 11.5–14.5)
WBC: 7.1 10*3/uL (ref 3.8–10.6)

## 2016-02-15 LAB — COMPREHENSIVE METABOLIC PANEL
ALT: 44 U/L (ref 17–63)
AST: 25 U/L (ref 15–41)
Albumin: 2.5 g/dL — ABNORMAL LOW (ref 3.5–5.0)
Alkaline Phosphatase: 86 U/L (ref 38–126)
Anion gap: 4 — ABNORMAL LOW (ref 5–15)
BUN: 13 mg/dL (ref 6–20)
CO2: 27 mmol/L (ref 22–32)
Calcium: 7.9 mg/dL — ABNORMAL LOW (ref 8.9–10.3)
Chloride: 106 mmol/L (ref 101–111)
Creatinine, Ser: 1.42 mg/dL — ABNORMAL HIGH (ref 0.61–1.24)
GFR calc Af Amer: 52 mL/min — ABNORMAL LOW (ref >60)
GFR calc non Af Amer: 44 mL/min — ABNORMAL LOW (ref >60)
Glucose, Bld: 118 mg/dL — ABNORMAL HIGH (ref 65–99)
Potassium: 4.1 mmol/L (ref 3.5–5.1)
Sodium: 137 mmol/L (ref 135–145)
Total Bilirubin: 0.6 mg/dL (ref 0.3–1.2)
Total Protein: 5.4 g/dL — ABNORMAL LOW (ref 6.5–8.1)

## 2016-02-15 LAB — LACTATE DEHYDROGENASE: LDH: 190 U/L (ref 98–192)

## 2016-02-15 LAB — URIC ACID: Uric Acid, Serum: 2.9 mg/dL — ABNORMAL LOW (ref 4.4–7.6)

## 2016-02-15 MED ORDER — DIPHENHYDRAMINE HCL 25 MG PO CAPS
50.0000 mg | ORAL_CAPSULE | Freq: Once | ORAL | Status: AC
  Administered 2016-02-15: 50 mg via ORAL
  Filled 2016-02-15: qty 2

## 2016-02-15 MED ORDER — SODIUM CHLORIDE 0.9 % IV SOLN
375.0000 mg/m2 | Freq: Once | INTRAVENOUS | Status: AC
  Administered 2016-02-15: 800 mg via INTRAVENOUS
  Filled 2016-02-15: qty 50

## 2016-02-15 MED ORDER — HEPARIN SOD (PORK) LOCK FLUSH 100 UNIT/ML IV SOLN
500.0000 [IU] | Freq: Once | INTRAVENOUS | Status: AC | PRN
  Administered 2016-02-15: 500 [IU]
  Filled 2016-02-15: qty 5

## 2016-02-15 MED ORDER — ONDANSETRON HCL 8 MG PO TABS: 8.0000 mg | ORAL_TABLET | Freq: Two times a day (BID) | ORAL | 0 refills | Status: DC | PRN

## 2016-02-15 MED ORDER — PALONOSETRON HCL INJECTION 0.25 MG/5ML
0.2500 mg | Freq: Once | INTRAVENOUS | Status: AC
  Administered 2016-02-15: 0.25 mg via INTRAVENOUS
  Filled 2016-02-15: qty 5

## 2016-02-15 MED ORDER — SODIUM CHLORIDE 0.9% FLUSH
10.0000 mL | INTRAVENOUS | Status: DC | PRN
  Filled 2016-02-15: qty 10

## 2016-02-15 MED ORDER — SODIUM CHLORIDE 0.9 % IV SOLN
400.0000 mg/m2 | Freq: Once | INTRAVENOUS | Status: AC
  Administered 2016-02-15: 860 mg via INTRAVENOUS
  Filled 2016-02-15: qty 43

## 2016-02-15 MED ORDER — SODIUM CHLORIDE 0.9 % IV SOLN
Freq: Once | INTRAVENOUS | Status: AC
  Administered 2016-02-15: 10:00:00 via INTRAVENOUS
  Filled 2016-02-15: qty 1000

## 2016-02-15 MED ORDER — SODIUM CHLORIDE 0.9 % IV SOLN: 10.0000 mg | Freq: Once | INTRAVENOUS | Status: DC

## 2016-02-15 MED ORDER — DOXORUBICIN HCL CHEMO IV INJECTION 2 MG/ML
25.0000 mg/m2 | Freq: Once | INTRAVENOUS | Status: AC
  Administered 2016-02-15: 54 mg via INTRAVENOUS
  Filled 2016-02-15: qty 27

## 2016-02-15 MED ORDER — PEGFILGRASTIM 6 MG/0.6ML ~~LOC~~ PSKT
6.0000 mg | PREFILLED_SYRINGE | Freq: Once | SUBCUTANEOUS | Status: AC
  Administered 2016-02-15: 6 mg via SUBCUTANEOUS
  Filled 2016-02-15: qty 0.6

## 2016-02-15 MED ORDER — SODIUM CHLORIDE 0.9 % IV SOLN: 375.0000 mg/m2 | Freq: Once | INTRAVENOUS | Status: DC

## 2016-02-15 MED ORDER — DEXAMETHASONE SODIUM PHOSPHATE 10 MG/ML IJ SOLN
10.0000 mg | Freq: Once | INTRAMUSCULAR | Status: AC
  Administered 2016-02-15: 10 mg via INTRAVENOUS
  Filled 2016-02-15: qty 3
  Filled 2016-02-15: qty 1

## 2016-02-15 MED ORDER — SODIUM CHLORIDE 0.9 % IV SOLN
1.0000 mg | Freq: Once | INTRAVENOUS | Status: AC
  Administered 2016-02-15: 1 mg via INTRAVENOUS
  Filled 2016-02-15: qty 1

## 2016-02-15 MED ORDER — ACETAMINOPHEN 325 MG PO TABS
650.0000 mg | ORAL_TABLET | Freq: Once | ORAL | Status: AC
  Administered 2016-02-15: 650 mg via ORAL
  Filled 2016-02-15: qty 2

## 2016-02-15 NOTE — Progress Notes (Signed)
[facility] day:  02/15/2016   Chief Complaint: Hayden Martin is a 82 y.o. 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  HPI:  The patient was last seen in the medical oncology clinic on 02/05/2016.  At that time, he was seen on day 14 of cycle #1 mini-RCHOP.  He had been seen in the ER on 02/02/2016 with shortness of breath and a productive cough.  He could not lay flat to sleep.  He had a moderate left sided pleural effusion and small to moderate right sided pleural effusion. Upper abdominal adenopathy was improving.  He underwent diagnostic and therapeutic left sided thoracentesis on 02/08/2016. One liter of dark blood tinged fluid was removed.  Glucose was 158 mg/dL.  Protein was 2.7 g/dL.  Cell count revealed 311 WBC (8% neutrophils, 82% lymphs, 10% monocytes).  Culture was negative.  Cytology revealed rare atypical lymphoid cells consistent with involvement with lymphoma.  Symptomatically, he feels "fair to midlin".   He notes congestion for which he is suing over the counter Mucinex.  He was on a Z-pack from 02/03/2016 - 02/07/2016.  He is receiving physical therapy and occupational therapy.  His shortness of breath is better since his thoracentesis.   He is scheduled to be discharged from [facility] on 02/20/2016.   Past Medical History:  Diagnosis Date  . Cancer ([place])   . Clotting disorder ([place])   . Diabetes mellitus without complication ([place])   . Heart disease   . Hypertension     Past Surgical History:  Procedure Laterality Date  . HERNIA REPAIR    . PERIPHERAL VASCULAR CATHETERIZATION N/A 01/18/2016   Procedure: IVC Filter Insertion;  Surgeon: Katha Cabal, MD;  Location: [facility];  Service: Cardiovascular;  Laterality: N/A;  . PERIPHERAL VASCULAR CATHETERIZATION N/A 01/21/2016   Procedure: Glori Luis Cath Insertion;  Surgeon: Algernon Huxley, MD;   Location: [facility];  Service: Cardiovascular;  Laterality: N/A;    No family history on file.  Social History:  reports that he quit smoking about 30 years ago. He smoked 1.50 packs per day. He has quit using smokeless tobacco. His alcohol and drug histories are not on file.  He was in the [employer].  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  He is currently living at [facility] for rehabilitation.  He has 3 children who live locally.  The patient is accompanied by his son, [name], today.  Allergies:  Allergies  Allergen Reactions  . Codeine Itching  . Penicillins Itching    Current Medications: Current Outpatient Prescriptions  Medication Sig Dispense Refill  . albuterol (PROVENTIL HFA) 108 (90 Base) MCG/ACT inhaler Inhale 2 puffs into the lungs every 4 (four) hours as needed for wheezing or shortness of breath. 1 Inhaler 0  . allopurinol (ZYLOPRIM) 300 MG tablet Take 1 tablet (300 mg total) by mouth daily. 30 tablet 0  . ALPRAZolam (XANAX) 0.25 MG tablet Take 1 tablet (0.25 mg total) by mouth every 6 (six) hours as needed for anxiety. 12 tablet 0  . azithromycin (ZITHROMAX Z-PAK) 250 MG tablet Take 2 tablets (500 mg) on  Day 1,  followed by 1 tablet (250 mg) once daily on Days 2 through 5. 6 each 0  . carvedilol (COREG) 6.25 MG tablet TAKE ONE (1) TABLET BY MOUTH TWO (2) TIMES DAILY    .  dronabinol (MARINOL) 2.5 MG capsule Take 1 capsule (2.5 mg total) by mouth 2 (two) times daily before lunch and supper. 60 capsule 0  . guaiFENesin (ROBITUSSIN) 100 MG/5ML SOLN Take 5 mLs (100 mg total) by mouth every 4 (four) hours as needed for cough or to loosen phlegm. 120 mL 0  . insulin aspart (NOVOLOG) 100 UNIT/ML FlexPen Inject 8 Units into the skin 3 (three) times daily with meals.     . Insulin Glargine (LANTUS [place]) Inject into the skin. Inject 35 units subcutaneously at bedtime.    [name] ketoconazole (NIZORAL) 2 % cream APPLY AT BEDTIME DAILY TO AFFECTED AREAS     . polyethylene glycol (MIRALAX / GLYCOLAX) packet Take 17 g by mouth daily as needed for moderate constipation. 14 each 0  . sulfamethoxazole-trimethoprim (BACTRIM DS,SEPTRA DS) 800-160 MG tablet Take 1 tablet by mouth 2 (two) times daily.    [name] enoxaparin (LOVENOX) 30 MG/0.3ML injection Inject 0.9 mLs (90 mg total) into the skin every 12 (twelve) hours. 60 Syringe 0   No current facility-administered medications for this visit.     Review of Systems:  GENERAL:  "Fair to midlin".  No fevers or sweats.  Weight down 4 pounds. PERFORMANCE STATUS (ECOG):  2 HEENT:  No visual changes, runny nose, sore throat, mouth sores or tenderness. Lungs: Shortness of breath, improved. Congestion using mucinex.  No hemoptysis. Cardiac:  No chest pain, palpitations, orthopnea, or PND. GI:  Eating well.  No vomiting, diarrhea, constipation, melena or hematochezia. GU:  No urgency, frequency, dysuria, or hematuria. Musculoskeletal:  Chronic back issues.  No muscle tenderness. Extremities:  No pain or swelling. Skin:  No rashes or skin changes. Neuro:  No headache, numbness or weakness, or coordination issues.  Balance issues. Endocrine:  Diabetes.  No thyroid issues, hot flashes or night sweats. Psych:  No mood changes, depression or anxiety. Pain:  No focal pain. Review of systems:  All other systems reviewed and found to be negative.  Physical Exam: Blood pressure 114/74, pulse 72, temperature 97.9 F (36.6 C), temperature source Tympanic, resp. rate 18, weight 200 lb 5 oz (90.9 kg). GENERAL:  Elderly gentleman sitting comfortably in a wheelchair in the exam room in no acute distress.  MENTAL STATUS:  Alert and oriented to person, place and time. HEAD:  Wearing an Scientist, clinical (histocompatibility and immunogenetics).  Gray hair.  Male pattern baldness.  Normocephalic, atraumatic, face symmetric, no Cushingoid features. EYES:  Blue eyes.  Pupils equal round and reactive to light and accomodation.  No conjunctivitis or scleral  icterus. ENT:  Hearing aide.  Oropharynx clear without lesion.  Tongue normal. Mucous membranes moist.  RESPIRATORY:  Decreased breath sounds at the bases, improved.  Clear to auscultation without rales, wheezes or rhonchi. CARDIOVASCULAR:  Regular rate and rhythm without murmur, rub or gallop. ABDOMEN:  Soft, non-tender, with active bowel sounds, and no hepatomegaly.  Left upper quadrant fullness.  No masses. SKIN:  No rashes, ulcers or lesions. EXTREMITIES:  Chronic dense 3+ lower extremity edema.  No skin discoloration or tenderness.  No palpable cords. LYMPH NODES: No palpable cervical, supraclavicular, axillary or inguinal adenopathy  NEUROLOGICAL: Unremarkable. PSYCH:  Appropriate.   Office Visit on 02/15/2016  Component Date Value Ref Range Status  . WBC 02/15/2016 7.1  3.8 - 10.6 K/uL Final  . RBC 02/15/2016 3.76* 4.40 - 5.90 MIL/uL Final  . Hemoglobin 02/15/2016 9.9* 13.0 - 18.0 g/dL Final  . HCT 02/15/2016 30.2* 40.0 - 52.0 % Final  .  MCV 02/15/2016 80.3  80.0 - 100.0 fL Final  . MCH 02/15/2016 26.3  26.0 - 34.0 pg Final  . MCHC 02/15/2016 32.8  32.0 - 36.0 g/dL Final  . RDW 02/15/2016 23.5* 11.5 - 14.5 % Final  . Platelets 02/15/2016 248  150 - 440 K/uL Final  . Neutrophils Relative % 02/15/2016 69  % Final  . Neutro Abs 02/15/2016 5.0  1.4 - 6.5 K/uL Final  . Lymphocytes Relative 02/15/2016 10  % Final  . Lymphs Abs 02/15/2016 0.7* 1.0 - 3.6 K/uL Final  . Monocytes Relative 02/15/2016 14  % Final  . Monocytes Absolute 02/15/2016 1.0  0.2 - 1.0 K/uL Final  . Eosinophils Relative 02/15/2016 4  % Final  . Eosinophils Absolute 02/15/2016 0.3  0 - 0.7 K/uL Final  . Basophils Relative 02/15/2016 3  % Final  . Basophils Absolute 02/15/2016 0.2* 0 - 0.1 K/uL Final  . Sodium 02/15/2016 137  135 - 145 mmol/L Final  . Potassium 02/15/2016 4.1  3.5 - 5.1 mmol/L Final  . Chloride 02/15/2016 106  101 - 111 mmol/L Final  . CO2 02/15/2016 27  22 - 32 mmol/L Final  . Glucose, Bld  02/15/2016 118* 65 - 99 mg/dL Final  . BUN 02/15/2016 13  6 - 20 mg/dL Final  . Creatinine, Ser 02/15/2016 1.42* 0.61 - 1.24 mg/dL Final  . Calcium 02/15/2016 7.9* 8.9 - 10.3 mg/dL Final  . Total Protein 02/15/2016 5.4* 6.5 - 8.1 g/dL Final  . Albumin 02/15/2016 2.5* 3.5 - 5.0 g/dL Final  . AST 02/15/2016 25  15 - 41 U/L Final  . ALT 02/15/2016 44  17 - 63 U/L Final  . Alkaline Phosphatase 02/15/2016 86  38 - 126 U/L Final  . Total Bilirubin 02/15/2016 0.6  0.3 - 1.2 mg/dL Final  . GFR calc non Af Amer 02/15/2016 44* >60 mL/min Final  . GFR calc Af Amer 02/15/2016 52* >60 mL/min Final   Comment: (NOTE) The eGFR has been calculated using the CKD EPI equation. This calculation has not been validated in all clinical situations. eGFR's persistently <60 mL/min signify possible Chronic Kidney Disease.   . Anion gap 02/15/2016 4* 5 - 15 Final  . LDH 02/15/2016 190  98 - 192 U/L Final    Assessment:  Hayden Martin is a 82 y.o. 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  He presented with a 30-35 pound weight loss in 2-3 months.  CT-guided retroperitoneal lymph node biopsy on 01/21/2016 revealed a CD30 positive large B-cell lymphoma. Immunohistochemical studies were positive for CD20, CD30, BCL-2, BCL 6, Ki-67 (> 90%) and negative for CD10 and cyclin D1.  Bone marrow biopsy on 01/21/2016 revealed no evidence of lymphoma.  Flow cytometry was negative.  Cytogenetics were normal (46, XY).  Abdomen and pelvic CT scan on 01/07/2016 revealed multiple solid masses within the spleen.  There was extensive retroperitoneal, portal and mesenteric lymphadenopathy.  Retrocrural node measured 2.1 x 2.0 cm.  Periportal node measured 2.6 x 5.8 cm. Confluent nodal mass is in the retroperitoneum surrounding the great vessels measured 5.6 x 3.8 cmon the left and 5.3 x 2.9 cm on the right.  Left iliac node measured 1.7 x 2.6 cm.  There was possible pleural disease on the left.  The prostate was markedly  enlarged.  There was no disease in the liver.  PET scan on 01/16/2016 revealed extensive hypermetabolic adenopathy within the neck, chest, abdomen and pelvis. The spleen was enlarged with multi  focal hypermetabolic splenic lesions There was evidence of transperitoneal spread of tumor within the upper abdomen. There were bilateral pleural effusions.  Hepatitis serologies were negative on 01/16/2016. G6PD testing negative.  Echo on 01/18/2016 revealed an EF of 55-60%.   He is s/p cycle #1 mini-RCHOP (01/23/2016 with Rituxan on 01/24/2016).  Cycle #1 was complicated by tumor lysis syndrome and symptomatic pleural effusions.  He underwent diagnostic and therapeutic left sided thoracentesis on 02/08/2016. Cytology revealed involvement with lymphoma.  He has a history of left lower extremity thrombosis.  He has had 2 clots 1 year apart and is on life long anticoagulation.  Bilateral lower extremity duplex on 01/16/2016 revealedDVT in the left lower extremityinvolving the calf veins and the left popliteal vein. Clot burden was greater than in 2014 suggesting acute thrombus. IVC filterwas placed on 01/18/2016.  He was on Xarelto.  He is on Lovenox.  He has chronic renal insufficiency (Cr 1.3- 1.4).  Chest CT angiogram on 02/02/2016 revealed no evidence of pulmonary emboli. There were bilateral pleural effusions (moderate size left and small to moderate right).  Symptomatically, his shortness of breath has improved post thoracentesis.  He is working with PT and OT at [facility].  He recently completed a course of azithromycin.  Exam reveals improvement in pleural effusions.  He has no palpable adenopathy.  Plan: 1.  Labs today:  CBC with diff, CMP, LDH, uric acid. 2.  Review results of thoracentesis.  Pleural effusion is + for lymphoma.  Anticipate resolution of fluid with ongoing treatment. 3.  Cylce #2 mini-RCHOP with OnPro Neulasta. 4.  RTC in 10 days for labs (CBC with diff, BMP, uric  acid). 5.  RTC in 3 weeks for MD assessment, labs (CBC with diff, CMP, LDH, uric acid), and cycle #3 mini-RCHOP.   Lequita Asal, MD  02/15/2016, 9:36 AM

## 2016-02-21 ENCOUNTER — Telehealth: Payer: Self-pay | Admitting: *Deleted

## 2016-02-21 NOTE — Telephone Encounter (Signed)
[facility] doctors are wanting to take him off Lovenox and put him back on oral "blood thinner" , they want Dr Kem Parkinson approval. Please advise. I asked him if this was urgent or could it wait until she returns from vacation, he said he will call them and ask how urgent it is and let me know

## 2016-02-26 ENCOUNTER — Inpatient Hospital Stay: Payer: No Typology Code available for payment source

## 2016-02-26 DIAGNOSIS — J90 Pleural effusion, not elsewhere classified: Secondary | ICD-10-CM

## 2016-02-26 DIAGNOSIS — C8338 Diffuse large B-cell lymphoma, lymph nodes of multiple sites: Secondary | ICD-10-CM

## 2016-02-26 LAB — BASIC METABOLIC PANEL
Anion gap: 4 — ABNORMAL LOW (ref 5–15)
BUN: 16 mg/dL (ref 6–20)
CO2: 32 mmol/L (ref 22–32)
Calcium: 7.7 mg/dL — ABNORMAL LOW (ref 8.9–10.3)
Chloride: 102 mmol/L (ref 101–111)
Creatinine, Ser: 1.24 mg/dL (ref 0.61–1.24)
GFR calc Af Amer: >60 mL/min (ref >60)
GFR calc non Af Amer: 52 mL/min — ABNORMAL LOW (ref >60)
Glucose, Bld: 136 mg/dL — ABNORMAL HIGH (ref 65–99)
Potassium: 3.6 mmol/L (ref 3.5–5.1)
Sodium: 138 mmol/L (ref 135–145)

## 2016-02-26 LAB — CBC WITH DIFFERENTIAL/PLATELET
Basophils Absolute: 0.1 10*3/uL (ref 0–0.1)
Basophils Relative: 1 %
Eosinophils Absolute: 0.1 10*3/uL (ref 0–0.7)
Eosinophils Relative: 2 %
HCT: 33.4 % — ABNORMAL LOW (ref 40.0–52.0)
Hemoglobin: 10.9 g/dL — ABNORMAL LOW (ref 13.0–18.0)
Lymphocytes Relative: 7 %
Lymphs Abs: 0.6 10*3/uL — ABNORMAL LOW (ref 1.0–3.6)
MCH: 26.6 pg (ref 26.0–34.0)
MCHC: 32.5 g/dL (ref 32.0–36.0)
MCV: 81.9 fL (ref 80.0–100.0)
Monocytes Absolute: 0.6 10*3/uL (ref 0.2–1.0)
Monocytes Relative: 7 %
Neutro Abs: 6.8 10*3/uL — ABNORMAL HIGH (ref 1.4–6.5)
Neutrophils Relative %: 83 %
Platelets: 127 10*3/uL — ABNORMAL LOW (ref 150–440)
RBC: 4.08 MIL/uL — ABNORMAL LOW (ref 4.40–5.90)
RDW: 24.5 % — ABNORMAL HIGH (ref 11.5–14.5)
WBC: 8.2 10*3/uL (ref 3.8–10.6)

## 2016-02-26 LAB — URIC ACID: Uric Acid, Serum: 4.1 mg/dL — ABNORMAL LOW (ref 4.4–7.6)

## 2016-03-04 ENCOUNTER — Telehealth: Payer: Self-pay | Admitting: Hematology and Oncology

## 2016-03-04 NOTE — Telephone Encounter (Signed)
Re:  Anticoagulation  Discussed with patient current anticoagulation with Lovenox.  Patient previously on Xarelto.  Patient switched to Lovenox with DVT in left leg and thrombocytopenia (platelet count 47,000) with first cycle of chemotherapy.  Patient notes that sometimes he forgot to take his Xarelto.  Platelet count nadir with cycle #2 was 127,000.  Plan to readdress anticoagulation when he returns for cycle #3 on 03/07/2016.  Consider reinitiation of Xarelto if good compliance with oral anticoagulation can be documented.  Lequita Asal, MD

## 2016-03-07 ENCOUNTER — Inpatient Hospital Stay: Payer: Medicare Other

## 2016-03-07 ENCOUNTER — Other Ambulatory Visit: Payer: Self-pay

## 2016-03-07 ENCOUNTER — Inpatient Hospital Stay: Payer: Medicare Other | Attending: Hematology and Oncology | Admitting: Hematology and Oncology

## 2016-03-07 ENCOUNTER — Ambulatory Visit
Admission: RE | Admit: 2016-03-07 | Discharge: 2016-03-07 | Disposition: A | Discharge status: Home / Self Care | Payer: Medicare Other | Source: Ambulatory Visit | Attending: Hematology and Oncology | Admitting: Hematology and Oncology

## 2016-03-07 ENCOUNTER — Other Ambulatory Visit: Payer: Self-pay | Admitting: *Deleted

## 2016-03-07 ENCOUNTER — Other Ambulatory Visit: Payer: Self-pay | Admitting: Hematology and Oncology

## 2016-03-07 VITALS — BP 170/96 | HR 84 | Temp 98.5°F | Ht 71.0 in | Wt 193.2 lb

## 2016-03-07 DIAGNOSIS — Z86718 Personal history of other venous thrombosis and embolism: Secondary | ICD-10-CM | POA: Diagnosis not present

## 2016-03-07 DIAGNOSIS — Z7689 Persons encountering health services in other specified circumstances: Secondary | ICD-10-CM | POA: Diagnosis not present

## 2016-03-07 DIAGNOSIS — N400 Enlarged prostate without lower urinary tract symptoms: Secondary | ICD-10-CM | POA: Diagnosis not present

## 2016-03-07 DIAGNOSIS — J019 Acute sinusitis, unspecified: Secondary | ICD-10-CM

## 2016-03-07 DIAGNOSIS — I129 Hypertensive chronic kidney disease with stage 1 through stage 4 chronic kidney disease, or unspecified chronic kidney disease: Secondary | ICD-10-CM

## 2016-03-07 DIAGNOSIS — D689 Coagulation defect, unspecified: Secondary | ICD-10-CM

## 2016-03-07 DIAGNOSIS — I10 Essential (primary) hypertension: Secondary | ICD-10-CM | POA: Diagnosis not present

## 2016-03-07 DIAGNOSIS — N189 Chronic kidney disease, unspecified: Secondary | ICD-10-CM

## 2016-03-07 DIAGNOSIS — R05 Cough: Secondary | ICD-10-CM

## 2016-03-07 DIAGNOSIS — Z87891 Personal history of nicotine dependence: Secondary | ICD-10-CM

## 2016-03-07 DIAGNOSIS — Z5111 Encounter for antineoplastic chemotherapy: Secondary | ICD-10-CM | POA: Insufficient documentation

## 2016-03-07 DIAGNOSIS — I82402 Acute embolism and thrombosis of unspecified deep veins of left lower extremity: Secondary | ICD-10-CM | POA: Diagnosis not present

## 2016-03-07 DIAGNOSIS — I82432 Acute embolism and thrombosis of left popliteal vein: Secondary | ICD-10-CM

## 2016-03-07 DIAGNOSIS — Z79899 Other long term (current) drug therapy: Secondary | ICD-10-CM

## 2016-03-07 DIAGNOSIS — H1031 Unspecified acute conjunctivitis, right eye: Secondary | ICD-10-CM

## 2016-03-07 DIAGNOSIS — Z794 Long term (current) use of insulin: Secondary | ICD-10-CM

## 2016-03-07 DIAGNOSIS — J00 Acute nasopharyngitis [common cold]: Secondary | ICD-10-CM

## 2016-03-07 DIAGNOSIS — C8338 Diffuse large B-cell lymphoma, lymph nodes of multiple sites: Secondary | ICD-10-CM

## 2016-03-07 DIAGNOSIS — H109 Unspecified conjunctivitis: Secondary | ICD-10-CM

## 2016-03-07 DIAGNOSIS — R590 Localized enlarged lymph nodes: Secondary | ICD-10-CM | POA: Insufficient documentation

## 2016-03-07 DIAGNOSIS — E1122 Type 2 diabetes mellitus with diabetic chronic kidney disease: Secondary | ICD-10-CM | POA: Diagnosis not present

## 2016-03-07 DIAGNOSIS — E119 Type 2 diabetes mellitus without complications: Secondary | ICD-10-CM

## 2016-03-07 DIAGNOSIS — I519 Heart disease, unspecified: Secondary | ICD-10-CM

## 2016-03-07 DIAGNOSIS — J90 Pleural effusion, not elsewhere classified: Secondary | ICD-10-CM | POA: Insufficient documentation

## 2016-03-07 DIAGNOSIS — Z7901 Long term (current) use of anticoagulants: Secondary | ICD-10-CM | POA: Insufficient documentation

## 2016-03-07 DIAGNOSIS — M21371 Foot drop, right foot: Secondary | ICD-10-CM | POA: Diagnosis not present

## 2016-03-07 DIAGNOSIS — Z9119 Patient's noncompliance with other medical treatment and regimen: Secondary | ICD-10-CM

## 2016-03-07 DIAGNOSIS — C8510 Unspecified B-cell lymphoma, unspecified site: Secondary | ICD-10-CM

## 2016-03-07 DIAGNOSIS — R059 Cough, unspecified: Secondary | ICD-10-CM

## 2016-03-07 DIAGNOSIS — J40 Bronchitis, not specified as acute or chronic: Secondary | ICD-10-CM

## 2016-03-07 LAB — COMPREHENSIVE METABOLIC PANEL
ALT: 46 U/L (ref 17–63)
AST: 30 U/L (ref 15–41)
Albumin: 2.5 g/dL — ABNORMAL LOW (ref 3.5–5.0)
Alkaline Phosphatase: 89 U/L (ref 38–126)
Anion gap: 3 — ABNORMAL LOW (ref 5–15)
BUN: 17 mg/dL (ref 6–20)
CO2: 30 mmol/L (ref 22–32)
Calcium: 8 mg/dL — ABNORMAL LOW (ref 8.9–10.3)
Chloride: 104 mmol/L (ref 101–111)
Creatinine, Ser: 1.27 mg/dL — ABNORMAL HIGH (ref 0.61–1.24)
GFR calc Af Amer: 59 mL/min — ABNORMAL LOW (ref >60)
GFR calc non Af Amer: 51 mL/min — ABNORMAL LOW (ref >60)
Glucose, Bld: 252 mg/dL — ABNORMAL HIGH (ref 65–99)
Potassium: 4.3 mmol/L (ref 3.5–5.1)
Sodium: 137 mmol/L (ref 135–145)
Total Bilirubin: 0.6 mg/dL (ref 0.3–1.2)
Total Protein: 5.1 g/dL — ABNORMAL LOW (ref 6.5–8.1)

## 2016-03-07 LAB — CBC WITH DIFFERENTIAL/PLATELET
Basophils Absolute: 0.1 10*3/uL (ref 0–0.1)
Basophils Relative: 1 %
Eosinophils Absolute: 0.1 10*3/uL (ref 0–0.7)
Eosinophils Relative: 1 %
HCT: 33.3 % — ABNORMAL LOW (ref 40.0–52.0)
Hemoglobin: 10.8 g/dL — ABNORMAL LOW (ref 13.0–18.0)
Lymphocytes Relative: 9 %
Lymphs Abs: 0.8 10*3/uL — ABNORMAL LOW (ref 1.0–3.6)
MCH: 27.2 pg (ref 26.0–34.0)
MCHC: 32.4 g/dL (ref 32.0–36.0)
MCV: 83.7 fL (ref 80.0–100.0)
Monocytes Absolute: 0.8 10*3/uL (ref 0.2–1.0)
Monocytes Relative: 9 %
Neutro Abs: 7 10*3/uL — ABNORMAL HIGH (ref 1.4–6.5)
Neutrophils Relative %: 80 %
Platelets: 162 10*3/uL (ref 150–440)
RBC: 3.97 MIL/uL — ABNORMAL LOW (ref 4.40–5.90)
RDW: 25.1 % — ABNORMAL HIGH (ref 11.5–14.5)
WBC: 8.8 10*3/uL (ref 3.8–10.6)

## 2016-03-07 LAB — URIC ACID: Uric Acid, Serum: 6 mg/dL (ref 4.4–7.6)

## 2016-03-07 LAB — LACTATE DEHYDROGENASE: LDH: 144 U/L (ref 98–192)

## 2016-03-07 MED ORDER — LEVOFLOXACIN 500 MG PO TABS: 500.0000 mg | ORAL_TABLET | Freq: Every day | ORAL | 0 refills | Status: DC

## 2016-03-07 NOTE — Progress Notes (Signed)
Patient here for follow up. Patient states he is congested and coughing up yellow sputum.Patient and son were  Unsure of medication regimen at this time, will bring list to next visit.

## 2016-03-07 NOTE — Progress Notes (Addendum)
[facility] day:  03/07/2016   Chief Complaint: Hayden Martin is a 83 y.o. 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  HPI:  The patient was last seen in the medical oncology clinic on 02/15/2016.  At that time, he was still in a care facility.  He was working with physical therapy and occupational therapy.  Shortness of breath had improved.  He received cycle #2 mini-RCHOP with OnPro Neulsta support.  Labs on 02/26/2016 revealed a hematocrit of 33.4, hemoglobin 10.9, MCV 81.9, platelets 127,000, WBC 8200 with an ANC of 6800.  Creatinine was 1.24.  Uric acid was 4.1.  Symptomatically, he notes a cold in his eye and congestion.  He states that he has been sick since discharge from [facility] on 02/20/2016.  He describes wiping "jink out of my eye" for 4 days.  He has had a cough.  He has green nasal discharge.  He has a yellow productive cough.  He denies any fever.  His activity level has improved.  He is walking.  He is mobilizing fluids with decreased feet and ankle edema.   Past Medical History:  Diagnosis Date  . Cancer ([place])   . Clotting disorder ([place])   . Diabetes mellitus without complication ([place])   . Heart disease   . Hypertension     Past Surgical History:  Procedure Laterality Date  . HERNIA REPAIR    . PERIPHERAL VASCULAR CATHETERIZATION N/A 01/18/2016   Procedure: IVC Filter Insertion;  Surgeon: Katha Cabal, MD;  Location: [place] [facility];  Service: Cardiovascular;  Laterality: N/A;  . PERIPHERAL VASCULAR CATHETERIZATION N/A 01/21/2016   Procedure: Glori Luis Cath Insertion;  Surgeon: Algernon Huxley, MD;  Location: [place] [facility];  Service: Cardiovascular;  Laterality: N/A;    No family history on file.  Social History:  reports that he quit smoking about 30 years ago. He smoked 1.50 packs per day. He has quit using smokeless tobacco. His alcohol  and drug histories are not on file.  He was in the [employer].  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  He was discharged from [facility] on 02/20/2016.  He has 3 children who live locally.  The patient is accompanied by his son, [name], today.  Allergies:  Allergies  Allergen Reactions  . Codeine Itching  . Penicillins Itching    Current Medications: Current Outpatient Prescriptions  Medication Sig Dispense Refill  . allopurinol (ZYLOPRIM) 300 MG tablet Take 1 tablet (300 mg total) by mouth daily. 30 tablet 0  . carvedilol (COREG) 6.25 MG tablet TAKE ONE (1) TABLET BY MOUTH TWO (2) TIMES DAILY    . enoxaparin (LOVENOX) 30 MG/0.3ML injection Inject 0.9 mLs (90 mg total) into the skin every 12 (twelve) hours. 60 Syringe 0  . insulin aspart (NOVOLOG) 100 UNIT/ML FlexPen Inject 8 Units into the skin 3 (three) times daily with meals.     . Insulin Glargine (LANTUS [place]) Inject into the skin. Inject 35 units subcutaneously at bedtime.    [name] ketoconazole (NIZORAL) 2 % cream APPLY AT BEDTIME DAILY TO AFFECTED AREAS    . LEVEMIR 100 UNIT/ML injection     . ondansetron (ZOFRAN) 8 MG tablet Take 1 tablet (8 mg total) by mouth 2 (two) times daily as needed for nausea or vomiting. 30 tablet 0  . polyethylene glycol (MIRALAX /  GLYCOLAX) packet Take 17 g by mouth daily as needed for moderate constipation. 14 each 0  . albuterol (PROVENTIL HFA) 108 (90 Base) MCG/ACT inhaler Inhale 2 puffs into the lungs every 4 (four) hours as needed for wheezing or shortness of breath. (Patient not taking: Reported on 03/07/2016) 1 Inhaler 0  . ALPRAZolam (XANAX) 0.25 MG tablet Take 1 tablet (0.25 mg total) by mouth every 6 (six) hours as needed for anxiety. (Patient not taking: Reported on 03/07/2016) 12 tablet 0  . dronabinol (MARINOL) 2.5 MG capsule Take 1 capsule (2.5 mg total) by mouth 2 (two) times daily before lunch and supper. (Patient not taking: Reported on 03/07/2016) 60 capsule 0  . guaiFENesin  (ROBITUSSIN) 100 MG/5ML SOLN Take 5 mLs (100 mg total) by mouth every 4 (four) hours as needed for cough or to loosen phlegm. (Patient not taking: Reported on 03/07/2016) 120 mL 0   No current facility-administered medications for this visit.     Review of Systems:  GENERAL:  Feels better.  More active.  No fevers or sweats.  Weight loss of 7 pounds since 02/15/2016 (fluid). PERFORMANCE STATUS (ECOG):  2 HEENT:  Right eye discharge.  Nasla congestion with green discharge.  No visual changes, sore throat, mouth sores or tenderness. Lungs: Shortness of breath on exertion.  Cough.  Yellow sputum.  No hemoptysis. Cardiac:  No chest pain, palpitations, orthopnea, or PND. GI:  Appetite good.  No vomiting, diarrhea, constipation, or hematochezia. GU:  No urgency, frequency, dysuria, or hematuria. Musculoskeletal:  Back issues.  No muscle tenderness. Extremities:  No pain or swelling. Skin:  Pruritus.  No rashes or skin changes. Neuro:  No headache, numbness or weakness, or coordination issues.  Balance issues. Endocrine:  Diabetes.  No thyroid issues, hot flashes or night sweats. Psych:  No mood changes, depression or anxiety. Pain:  No focal pain. Review of systems:  All other systems reviewed and found to be negative.  Physical Exam: Blood pressure (!) 170/96, pulse 84, temperature 98.5 F (36.9 C), temperature source Oral, height _0  (1.803 m), weight 193 lb 3.7 oz (87.6 kg), SpO2 97 %. GENERAL:  Elderly gentleman sitting comfortably in a wheelchair in the exam room in no acute distress.  MENTAL STATUS:  Alert and oriented to person, place and time. HEAD:  Wearing an Scientist, clinical (histocompatibility and immunogenetics).  Gray hair.  Male pattern baldness.  Normocephalic, atraumatic, face symmetric, no Cushingoid features. EYES:  Periorbital right sided erythema.  Conjunctiva injected.  Blue eyes.  Pupils equal round and reactive to light and accomodation.  No scleral icterus. ENT:  Hearing aide.  Sinuses non-tender to  percussion.  Oropharynx clear without lesion.  Tongue normal.  Mucous membranes moist.  RESPIRATORY:  Decreased breath sounds left base, improved.  Basilar crckles which improve on deep breathing (? atelectasis).  Otherwise, clear to auscultation without rales, wheezes or rhonchi. CARDIOVASCULAR:  Regular rate and rhythm without murmur, rub or gallop. ABDOMEN:  Soft, non-tender, with active bowel sounds, and no appreciable hepatosplenomegaly.  No masses. SKIN:  No rashes, ulcers or lesions. EXTREMITIES:  Improved bilateral chronic lower extremity edema (left > right).  No skin discoloration or tenderness.  No palpable cords. LYMPH NODES: No palpable cervical, supraclavicular, axillary or inguinal adenopathy  NEUROLOGICAL: Unremarkable. PSYCH:  Appropriate.   Orders Only on 03/07/2016  Component Date Value Ref Range Status  . WBC 03/07/2016 8.8  3.8 - 10.6 K/uL Final  . RBC 03/07/2016 3.97* 4.40 - 5.90 MIL/uL Final  .  Hemoglobin 03/07/2016 10.8* 13.0 - 18.0 g/dL Final  . HCT 03/07/2016 33.3* 40.0 - 52.0 % Final  . MCV 03/07/2016 83.7  80.0 - 100.0 fL Final  . MCH 03/07/2016 27.2  26.0 - 34.0 pg Final  . MCHC 03/07/2016 32.4  32.0 - 36.0 g/dL Final  . RDW 03/07/2016 25.1* 11.5 - 14.5 % Final  . Platelets 03/07/2016 162  150 - 440 K/uL Final  . Neutrophils Relative % 03/07/2016 80  % Final  . Neutro Abs 03/07/2016 7.0* 1.4 - 6.5 K/uL Final  . Lymphocytes Relative 03/07/2016 9  % Final  . Lymphs Abs 03/07/2016 0.8* 1.0 - 3.6 K/uL Final  . Monocytes Relative 03/07/2016 9  % Final  . Monocytes Absolute 03/07/2016 0.8  0.2 - 1.0 K/uL Final  . Eosinophils Relative 03/07/2016 1  % Final  . Eosinophils Absolute 03/07/2016 0.1  0 - 0.7 K/uL Final  . Basophils Relative 03/07/2016 1  % Final  . Basophils Absolute 03/07/2016 0.1  0 - 0.1 K/uL Final  . Sodium 03/07/2016 137  135 - 145 mmol/L Final  . Potassium 03/07/2016 4.3  3.5 - 5.1 mmol/L Final  . Chloride 03/07/2016 104  101 - 111 mmol/L  Final  . CO2 03/07/2016 30  22 - 32 mmol/L Final  . Glucose, Bld 03/07/2016 252* 65 - 99 mg/dL Final  . BUN 03/07/2016 17  6 - 20 mg/dL Final  . Creatinine, Ser 03/07/2016 1.27* 0.61 - 1.24 mg/dL Final  . Calcium 03/07/2016 8.0* 8.9 - 10.3 mg/dL Final  . Total Protein 03/07/2016 5.1* 6.5 - 8.1 g/dL Final  . Albumin 03/07/2016 2.5* 3.5 - 5.0 g/dL Final  . AST 03/07/2016 30  15 - 41 U/L Final  . ALT 03/07/2016 46  17 - 63 U/L Final  . Alkaline Phosphatase 03/07/2016 89  38 - 126 U/L Final  . Total Bilirubin 03/07/2016 0.6  0.3 - 1.2 mg/dL Final  . GFR calc non Af Amer 03/07/2016 51* >60 mL/min Final  . GFR calc Af Amer 03/07/2016 59* >60 mL/min Final   Comment: (NOTE) The eGFR has been calculated using the CKD EPI equation. This calculation has not been validated in all clinical situations. eGFR's persistently <60 mL/min signify possible Chronic Kidney Disease.   . Anion gap 03/07/2016 3* 5 - 15 Final  . LDH 03/07/2016 144  98 - 192 U/L Final  . Uric Acid, Serum 03/07/2016 6.0  4.4 - 7.6 mg/dL Final    Assessment:  Hayden Martin is a 83 y.o. 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  He presented with a 30-35 pound weight loss in 2-3 months.  CT-guided retroperitoneal lymph node biopsy on 01/21/2016 revealed a CD30 positive large B-cell lymphoma. Immunohistochemical studies were positive for CD20, CD30, BCL-2, BCL 6, Ki-67 (> 90%) and negative for CD10 and cyclin D1.  Bone marrow biopsy on 01/21/2016 revealed no evidence of lymphoma.  Flow cytometry was negative.  Cytogenetics were normal (46, XY).  Abdomen and pelvic CT scan on 01/07/2016 revealed multiple solid masses within the spleen.  There was extensive retroperitoneal, portal and mesenteric lymphadenopathy.  Retrocrural node measured 2.1 x 2.0 cm.  Periportal node measured 2.6 x 5.8 cm. Confluent nodal mass is in the retroperitoneum surrounding the great vessels measured 5.6 x 3.8 cmon the left and 5.3 x 2.9 cm on  the right.  Left iliac node measured 1.7 x 2.6 cm.  There was possible pleural disease on the left.  The prostate  was markedly enlarged.  There was no disease in the liver.  PET scan on 01/16/2016 revealed extensive hypermetabolic adenopathy within the neck, chest, abdomen and pelvis. The spleen was enlarged with multi focal hypermetabolic splenic lesions There was evidence of transperitoneal spread of tumor within the upper abdomen. There were bilateral pleural effusions.  Hepatitis serologies were negative on 01/16/2016. G6PD testing negative.  Echo on 01/18/2016 revealed an EF of 55-60%.   He is s/p 2 cycles of mini-RCHOP (01/23/2016 -02/15/2016).  Cycle #1 was complicated by tumor lysis syndrome, leukopenia, and symptomatic pleural effusions.  Diagnostic and 1 liter therapeutic left sided thoracentesis on 02/08/2016 revealed involvement with lymphoma.  He received OnPro Neulasta with cycle #2.  He has a history of left lower extremity thrombosis.  He has had 2 clots 1 year apart and is on life long anticoagulation.  Bilateral lower extremity duplex on 01/16/2016 revealed DVT in the left lower extremity involving the calf veins and the left popliteal vein.  IVC filterwas placed on 01/18/2016.  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  There was interval resolution of the left calf vein DVT.  He was on Xarelto.  He has been on Lovenox.  He has chronic renal insufficiency (Cr 1.3- 1.4).  Creatinine is 1.27 (CrCl 51 ml/min) today.  Chest CT angiogram on 02/02/2016 revealed no evidence of pulmonary emboli. There were bilateral pleural effusions (moderate size left and small to moderate right).  Symptomatically, he has yellow productive cough and green nasal discharge.  Exam reveals right sided conjunctivitis.  He denies any fevers.  Plan: 1.  Labs today:  CBC with diff,  CMP, LDH, uric acid. 2.  Discuss current infection (sinusitis, conjunctivitis and likely bronchitis).  Discus CXR today to ensure no evidence of pneumonia.  Discuss postponing chemotherapy x 1 week and initiating antibiotics.  Discuss allergy to amoxicillin/penicillin.  Discuss doxycycline or Levaquin. 3.  Postpone cycle #3 mini-RCHOP today. 4.  CXR (PA and lateral) today. 5.  Rx:  Levaquin 500 mg po q day x 10 days. 6.  Patient to call office if not improving by 03/10/2016. 7.  Discuss anticoagulation.  Discuss compliance issues with Xarelto in the past (patient would forget to take).  Discuss clot at diagnosis.  Discuss Xarelto failure or non-compliance.  Discuss other options including Coumadin, continuation of Lovenox, or Eliquis.  Patient adamant about no Coumadin.  He does not want to continue Lovenox injections.  Patient wants to return to Xarelto.  Discuss potential risk of clot if Xarelto failure.  Patient accepts risk.  Plan to start Xarelto tomorrow. 8.  RTC in 1 week for MD assessment, labs (CBC with diff, CMP, LDH, uric acid), and cycle #3 mini-RCHOP.  Addendum:  CXR revealed no active cardiopulmonary disease.  He has trace bilateral pleural effusions.   Lequita Asal, MD  03/07/2016, 11:29 AM

## 2016-03-08 ENCOUNTER — Encounter: Payer: Self-pay | Admitting: Hematology and Oncology

## 2016-03-08 DIAGNOSIS — Z5111 Encounter for antineoplastic chemotherapy: Secondary | ICD-10-CM | POA: Insufficient documentation

## 2016-03-10 ENCOUNTER — Other Ambulatory Visit: Payer: Self-pay | Admitting: *Deleted

## 2016-03-10 ENCOUNTER — Telehealth: Payer: Self-pay | Admitting: *Deleted

## 2016-03-10 ENCOUNTER — Inpatient Hospital Stay: Payer: Medicare Other

## 2016-03-10 DIAGNOSIS — Z95828 Presence of other vascular implants and grafts: Secondary | ICD-10-CM

## 2016-03-10 DIAGNOSIS — C8338 Diffuse large B-cell lymphoma, lymph nodes of multiple sites: Secondary | ICD-10-CM

## 2016-03-10 MED ORDER — LIDOCAINE-PRILOCAINE 2.5-2.5 % EX CREA: 1.0000 "application " | TOPICAL_CREAM | CUTANEOUS | 3 refills | Status: DC | PRN

## 2016-03-10 MED ORDER — SODIUM CHLORIDE 0.9% FLUSH
10.0000 mL | INTRAVENOUS | Status: AC | PRN
  Administered 2016-03-10: 10 mL via INTRAVENOUS
  Filled 2016-03-10: qty 10

## 2016-03-10 MED ORDER — LIDOCAINE-PRILOCAINE 2.5-2.5 % EX CREA: 1.0000 "application " | TOPICAL_CREAM | CUTANEOUS | 0 refills | Status: DC | PRN

## 2016-03-10 MED ORDER — HEPARIN SOD (PORK) LOCK FLUSH 100 UNIT/ML IV SOLN
500.0000 [IU] | Freq: Once | INTRAVENOUS | Status: AC
  Administered 2016-03-10: 500 [IU] via INTRAVENOUS

## 2016-03-10 NOTE — Progress Notes (Signed)
Patient came to clinic today - requesting rf on emla cream.

## 2016-03-10 NOTE — Telephone Encounter (Signed)
Ort was left with needle in it Friday, what to do? Appt given to come in this morning to have it deaccessed

## 2016-03-14 ENCOUNTER — Inpatient Hospital Stay: Payer: Medicare Other

## 2016-03-14 ENCOUNTER — Other Ambulatory Visit: Payer: Self-pay | Admitting: Hematology and Oncology

## 2016-03-14 ENCOUNTER — Other Ambulatory Visit: Payer: Self-pay | Admitting: *Deleted

## 2016-03-14 ENCOUNTER — Inpatient Hospital Stay (HOSPITAL_BASED_OUTPATIENT_CLINIC_OR_DEPARTMENT_OTHER): Payer: Medicare Other | Admitting: Hematology and Oncology

## 2016-03-14 VITALS — BP 95/60 | HR 84 | Temp 98.0°F | Resp 18 | Wt 190.0 lb

## 2016-03-14 DIAGNOSIS — Z7689 Persons encountering health services in other specified circumstances: Secondary | ICD-10-CM | POA: Diagnosis not present

## 2016-03-14 DIAGNOSIS — C8338 Diffuse large B-cell lymphoma, lymph nodes of multiple sites: Secondary | ICD-10-CM

## 2016-03-14 DIAGNOSIS — R05 Cough: Secondary | ICD-10-CM

## 2016-03-14 DIAGNOSIS — D689 Coagulation defect, unspecified: Secondary | ICD-10-CM

## 2016-03-14 DIAGNOSIS — I82402 Acute embolism and thrombosis of unspecified deep veins of left lower extremity: Secondary | ICD-10-CM | POA: Diagnosis not present

## 2016-03-14 DIAGNOSIS — J90 Pleural effusion, not elsewhere classified: Secondary | ICD-10-CM

## 2016-03-14 DIAGNOSIS — N189 Chronic kidney disease, unspecified: Secondary | ICD-10-CM

## 2016-03-14 DIAGNOSIS — Z79899 Other long term (current) drug therapy: Secondary | ICD-10-CM

## 2016-03-14 DIAGNOSIS — I82432 Acute embolism and thrombosis of left popliteal vein: Secondary | ICD-10-CM

## 2016-03-14 DIAGNOSIS — I519 Heart disease, unspecified: Secondary | ICD-10-CM

## 2016-03-14 DIAGNOSIS — Z7901 Long term (current) use of anticoagulants: Secondary | ICD-10-CM

## 2016-03-14 DIAGNOSIS — J00 Acute nasopharyngitis [common cold]: Secondary | ICD-10-CM

## 2016-03-14 DIAGNOSIS — R590 Localized enlarged lymph nodes: Secondary | ICD-10-CM

## 2016-03-14 DIAGNOSIS — Z87891 Personal history of nicotine dependence: Secondary | ICD-10-CM

## 2016-03-14 DIAGNOSIS — I10 Essential (primary) hypertension: Secondary | ICD-10-CM

## 2016-03-14 DIAGNOSIS — Z794 Long term (current) use of insulin: Secondary | ICD-10-CM

## 2016-03-14 DIAGNOSIS — M21371 Foot drop, right foot: Secondary | ICD-10-CM | POA: Diagnosis not present

## 2016-03-14 DIAGNOSIS — C8510 Unspecified B-cell lymphoma, unspecified site: Secondary | ICD-10-CM

## 2016-03-14 DIAGNOSIS — N400 Enlarged prostate without lower urinary tract symptoms: Secondary | ICD-10-CM

## 2016-03-14 DIAGNOSIS — Z9119 Patient's noncompliance with other medical treatment and regimen: Secondary | ICD-10-CM

## 2016-03-14 DIAGNOSIS — Z86718 Personal history of other venous thrombosis and embolism: Secondary | ICD-10-CM

## 2016-03-14 DIAGNOSIS — I129 Hypertensive chronic kidney disease with stage 1 through stage 4 chronic kidney disease, or unspecified chronic kidney disease: Secondary | ICD-10-CM

## 2016-03-14 DIAGNOSIS — H109 Unspecified conjunctivitis: Secondary | ICD-10-CM

## 2016-03-14 DIAGNOSIS — E1122 Type 2 diabetes mellitus with diabetic chronic kidney disease: Secondary | ICD-10-CM

## 2016-03-14 LAB — COMPREHENSIVE METABOLIC PANEL
ALT: 21 U/L (ref 17–63)
AST: 20 U/L (ref 15–41)
Albumin: 2.7 g/dL — ABNORMAL LOW (ref 3.5–5.0)
Alkaline Phosphatase: 67 U/L (ref 38–126)
Anion gap: 5 (ref 5–15)
BUN: 22 mg/dL — ABNORMAL HIGH (ref 6–20)
CO2: 26 mmol/L (ref 22–32)
Calcium: 8.5 mg/dL — ABNORMAL LOW (ref 8.9–10.3)
Chloride: 107 mmol/L (ref 101–111)
Creatinine, Ser: 1.28 mg/dL — ABNORMAL HIGH (ref 0.61–1.24)
GFR calc Af Amer: 58 mL/min — ABNORMAL LOW (ref >60)
GFR calc non Af Amer: 50 mL/min — ABNORMAL LOW (ref >60)
Glucose, Bld: 205 mg/dL — ABNORMAL HIGH (ref 65–99)
Potassium: 4.2 mmol/L (ref 3.5–5.1)
Sodium: 138 mmol/L (ref 135–145)
Total Bilirubin: 0.8 mg/dL (ref 0.3–1.2)
Total Protein: 5.1 g/dL — ABNORMAL LOW (ref 6.5–8.1)

## 2016-03-14 LAB — CBC WITH DIFFERENTIAL/PLATELET
Basophils Absolute: 0.1 10*3/uL (ref 0–0.1)
Basophils Relative: 1 %
Eosinophils Absolute: 0.3 10*3/uL (ref 0–0.7)
Eosinophils Relative: 4 %
HCT: 36 % — ABNORMAL LOW (ref 40.0–52.0)
Hemoglobin: 11.6 g/dL — ABNORMAL LOW (ref 13.0–18.0)
Lymphocytes Relative: 14 %
Lymphs Abs: 1.1 10*3/uL (ref 1.0–3.6)
MCH: 27.3 pg (ref 26.0–34.0)
MCHC: 32.3 g/dL (ref 32.0–36.0)
MCV: 84.4 fL (ref 80.0–100.0)
Monocytes Absolute: 0.9 10*3/uL (ref 0.2–1.0)
Monocytes Relative: 11 %
Neutro Abs: 5.4 10*3/uL (ref 1.4–6.5)
Neutrophils Relative %: 70 %
Platelets: 199 10*3/uL (ref 150–440)
RBC: 4.26 MIL/uL — ABNORMAL LOW (ref 4.40–5.90)
RDW: 24.5 % — ABNORMAL HIGH (ref 11.5–14.5)
WBC: 7.7 10*3/uL (ref 3.8–10.6)

## 2016-03-14 LAB — LACTATE DEHYDROGENASE: LDH: 144 U/L (ref 98–192)

## 2016-03-14 LAB — URIC ACID: Uric Acid, Serum: 6.8 mg/dL (ref 4.4–7.6)

## 2016-03-14 MED ORDER — CYCLOPHOSPHAMIDE CHEMO INJECTION 1 GM
400.0000 mg/m2 | Freq: Once | INTRAMUSCULAR | Status: AC
  Administered 2016-03-14: 860 mg via INTRAVENOUS
  Filled 2016-03-14: qty 43

## 2016-03-14 MED ORDER — SODIUM CHLORIDE 0.9 % IV SOLN: 375.0000 mg/m2 | Freq: Once | INTRAVENOUS | Status: DC

## 2016-03-14 MED ORDER — HEPARIN SOD (PORK) LOCK FLUSH 100 UNIT/ML IV SOLN
500.0000 [IU] | Freq: Once | INTRAVENOUS | Status: AC
  Administered 2016-03-14: 500 [IU] via INTRAVENOUS

## 2016-03-14 MED ORDER — SODIUM CHLORIDE 0.9 % IV SOLN: 10.0000 mg | Freq: Once | INTRAVENOUS | Status: DC

## 2016-03-14 MED ORDER — RITUXIMAB CHEMO INJECTION 500 MG/50ML
375.0000 mg/m2 | Freq: Once | INTRAVENOUS | Status: AC
  Administered 2016-03-14: 800 mg via INTRAVENOUS
  Filled 2016-03-14: qty 50

## 2016-03-14 MED ORDER — DEXAMETHASONE SODIUM PHOSPHATE 10 MG/ML IJ SOLN
10.0000 mg | Freq: Once | INTRAMUSCULAR | Status: AC
  Administered 2016-03-14: 10 mg via INTRAVENOUS
  Filled 2016-03-14: qty 1

## 2016-03-14 MED ORDER — SODIUM CHLORIDE 0.9 % IJ SOLN
10.0000 mL | Freq: Once | INTRAMUSCULAR | Status: AC
  Administered 2016-03-14: 10 mL via INTRAVENOUS
  Filled 2016-03-14: qty 10

## 2016-03-14 MED ORDER — DOXORUBICIN HCL CHEMO IV INJECTION 2 MG/ML
25.0000 mg/m2 | Freq: Once | INTRAVENOUS | Status: AC
Start: 2016-03-14 — End: 2016-03-14
  Administered 2016-03-14: 54 mg via INTRAVENOUS
  Filled 2016-03-14: qty 27

## 2016-03-14 MED ORDER — PREDNISONE 20 MG PO TABS: 80.0000 mg | ORAL_TABLET | Freq: Every day | ORAL | 5 refills | Status: AC

## 2016-03-14 MED ORDER — DIPHENHYDRAMINE HCL 25 MG PO CAPS
50.0000 mg | ORAL_CAPSULE | Freq: Once | ORAL | Status: AC
  Administered 2016-03-14: 50 mg via ORAL
  Filled 2016-03-14: qty 2

## 2016-03-14 MED ORDER — SODIUM CHLORIDE 0.9 % IV SOLN
Freq: Once | INTRAVENOUS | Status: AC
  Administered 2016-03-14: 11:00:00 via INTRAVENOUS
  Filled 2016-03-14: qty 1000

## 2016-03-14 MED ORDER — ACETAMINOPHEN 325 MG PO TABS
650.0000 mg | ORAL_TABLET | Freq: Once | ORAL | Status: AC
  Administered 2016-03-14: 650 mg via ORAL
  Filled 2016-03-14: qty 2

## 2016-03-14 MED ORDER — PEGFILGRASTIM 6 MG/0.6ML ~~LOC~~ PSKT
6.0000 mg | PREFILLED_SYRINGE | Freq: Once | SUBCUTANEOUS | Status: AC
  Administered 2016-03-14: 6 mg via SUBCUTANEOUS
  Filled 2016-03-14: qty 0.6

## 2016-03-14 MED ORDER — PALONOSETRON HCL INJECTION 0.25 MG/5ML
0.2500 mg | Freq: Once | INTRAVENOUS | Status: AC
  Administered 2016-03-14: 0.25 mg via INTRAVENOUS
  Filled 2016-03-14: qty 5

## 2016-03-14 NOTE — Progress Notes (Signed)
Patient is here for follow up, he mentions swelling in his feet and ankles

## 2016-03-14 NOTE — Progress Notes (Addendum)
Hayden Martin is a 83 y.o. 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  HPI:  The patient was last seen in the medical oncology clinic on 03/07/2016.  At that time, cycle #3 mini-RCHOP was postponed secondary to sinusitis, right eye conjunctivitis, and bronchitis.  He had green nasal discharge and a yellow productive cough.  He was prescribed Levaquin.  During the interim, he has "felt better every day".   Nasal discharge and cough have improved.  Purulent discharge from his right eye has resolved.  He continues to note lower extremity edema.  He notes the inability to lift his right toes since returning home (02/20/2016).   Past Medical History:  Diagnosis Date  . Cancer ([place])   . Clotting disorder ([place])   . Diabetes mellitus without complication ([place])   . Heart disease   . Hypertension     Past Surgical History:  Procedure Laterality Date  . HERNIA REPAIR    . PERIPHERAL VASCULAR CATHETERIZATION N/A 01/18/2016   Procedure: IVC Filter Insertion;  Surgeon: Katha Cabal, MD;  Location: [facility];  Service: Cardiovascular;  Laterality: N/A;  . PERIPHERAL VASCULAR CATHETERIZATION N/A 01/21/2016   Procedure: Glori Luis Cath Insertion;  Surgeon: Algernon Huxley, MD;  Location: [facility];  Service: Cardiovascular;  Laterality: N/A;    No family history on file.  Social History:  reports that he quit smoking about 30 years ago. He smoked 1.50 packs per day. He has quit using smokeless tobacco. His alcohol and drug histories are not on file.  He was in the [employer].  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  He was discharged from [facility] on 02/20/2016.  He has 3 children who live locally.  The patient is accompanied by his son, [name], today.  Allergies:  Allergies  Allergen Reactions   . Codeine Itching  . Penicillins Itching    Current Medications: Current Outpatient Prescriptions  Medication Sig Dispense Refill  . allopurinol (ZYLOPRIM) 300 MG tablet Take 1 tablet (300 mg total) by mouth daily. 30 tablet 0  . carvedilol (COREG) 6.25 MG tablet TAKE ONE (1) TABLET BY MOUTH TWO (2) TIMES DAILY    . enoxaparin (LOVENOX) 30 MG/0.3ML injection Inject 0.9 mLs (90 mg total) into the skin every 12 (twelve) hours. 60 Syringe 0  . insulin aspart (NOVOLOG) 100 UNIT/ML FlexPen Inject 8 Units into the skin 3 (three) times daily with meals.     . Insulin Glargine (LANTUS [place]) Inject into the skin. Inject 35 units subcutaneously at bedtime.    [name] ketoconazole (NIZORAL) 2 % cream APPLY AT BEDTIME DAILY TO AFFECTED AREAS    . LEVEMIR 100 UNIT/ML injection     . levofloxacin (LEVAQUIN) 500 MG tablet Take 1 tablet (500 mg total) by mouth daily. 10 tablet 0  . lidocaine-prilocaine (EMLA) cream Apply 1 application topically as needed. 30 g 3  . lidocaine-prilocaine (EMLA) cream Apply 1 application topically as needed. 30 g 0  . ondansetron (ZOFRAN) 8 MG tablet Take 1 tablet (8 mg total) by mouth 2 (two) times daily as needed for nausea or vomiting. 30 tablet 0  . polyethylene glycol (MIRALAX / GLYCOLAX) packet Take 17 g by mouth daily as needed for moderate constipation. 14 each 0  . albuterol (PROVENTIL HFA)  108 (90 Base) MCG/ACT inhaler Inhale 2 puffs into the lungs every 4 (four) hours as needed for wheezing or shortness of breath. (Patient not taking: Reported on 03/14/2016) 1 Inhaler 0  . ALPRAZolam (XANAX) 0.25 MG tablet Take 1 tablet (0.25 mg total) by mouth every 6 (six) hours as needed for anxiety. (Patient not taking: Reported on 03/14/2016) 12 tablet 0  . dronabinol (MARINOL) 2.5 MG capsule Take 1 capsule (2.5 mg total) by mouth 2 (two) times daily before lunch and supper. (Patient not taking: Reported on 03/14/2016) 60 capsule 0  . guaiFENesin (ROBITUSSIN) 100 MG/5ML SOLN Take 5 mLs  (100 mg total) by mouth every 4 (four) hours as needed for cough or to loosen phlegm. (Patient not taking: Reported on 03/14/2016) 120 mL 0   No current facility-administered medications for this visit.    Facility-Administered Medications Ordered in Other Visits  Medication Dose Route Frequency Provider Last Rate Last Dose  . sodium chloride flush (NS) 0.9 % injection 10 mL  10 mL Intravenous PRN Cammie Sickle, MD   10 mL at 03/10/16 0949    Review of Systems:  GENERAL:  Feels good.  No fevers or sweats.  Weight loss of 3 pounds. PERFORMANCE STATUS (ECOG):  2 HEENT:  Right eye and nasal discharge, improved.  No visual changes, sore throat, mouth sores or tenderness. Lungs: Shortness of breath on exertion.  Slight clear cough.  No hemoptysis. Cardiac:  No chest pain, palpitations, orthopnea, or PND. GI:  Appetite good.  No vomiting, diarrhea, constipation, or hematochezia. GU:  No urgency, frequency, dysuria, or hematuria. Musculoskeletal:  Back issues.  No muscle tenderness. Extremities:  No pain or swelling. Skin:  No rashes or skin changes. Neuro:  Unable to lift right toes for several weeks.  No headache, numbness or weakness, or coordination issues.  Balance issues. Endocrine:  Diabetes.  No thyroid issues, hot flashes or night sweats. Psych:  No mood changes, depression or anxiety. Pain:  No focal pain. Review of systems:  All other systems reviewed and found to be negative.  Physical Exam: Blood pressure 95/60, pulse 84, temperature 98 F (36.7 C), temperature source Tympanic, resp. rate 18, weight 190 lb 0.6 oz (86.2 kg). GENERAL:  Elderly gentleman sitting comfortably in a wheelchair in the exam room in no acute distress.  MENTAL STATUS:  Alert and oriented to person, place and time. HEAD:  Wearing an Scientist, clinical (histocompatibility and immunogenetics).  Gray hair.  Male pattern baldness.  Normocephalic, atraumatic, face symmetric, no Cushingoid features. EYES: Rght periorbital erythema, resolved.   Blue eyes.  Pupils equal round and reactive to light and accomodation.  No conjunctivitis or scleral icterus. ENT:  Hearing aide.  Sinuses non-tender to percussion.  Oropharynx clear without lesion.  Tongue normal.  Mucous membranes moist.  RESPIRATORY:  Decreased breath sounds left base, improved.  Clear to auscultation without rales, wheezes or rhonchi. CARDIOVASCULAR:  Regular rate and rhythm without murmur, rub or gallop. ABDOMEN:  Soft, non-tender, with active bowel sounds, and no appreciable hepatosplenomegaly.  No masses. SKIN:  No rashes, ulcers or lesions. EXTREMITIES:  Improved bilateral chronic lower extremity edema (left > right).  No skin discoloration or tenderness.  No palpable cords. LYMPH NODES: No palpable cervical, supraclavicular, axillary or inguinal adenopathy  NEUROLOGICAL:  Right sided foot drop.  Otherwise, lower extremity strength and sensation intact. PSYCH:  Appropriate.   Clinical Support on 03/14/2016  Component Date Value Ref Range Status  . WBC 03/14/2016 7.7  3.8 -  10.6 K/uL Final  . RBC 03/14/2016 4.26* 4.40 - 5.90 MIL/uL Final  . Hemoglobin 03/14/2016 11.6* 13.0 - 18.0 g/dL Final  . HCT 03/14/2016 36.0* 40.0 - 52.0 % Final  . MCV 03/14/2016 84.4  80.0 - 100.0 fL Final  . MCH 03/14/2016 27.3  26.0 - 34.0 pg Final  . MCHC 03/14/2016 32.3  32.0 - 36.0 g/dL Final  . RDW 03/14/2016 24.5* 11.5 - 14.5 % Final  . Platelets 03/14/2016 199  150 - 440 K/uL Final  . Neutrophils Relative % 03/14/2016 70  % Final  . Neutro Abs 03/14/2016 5.4  1.4 - 6.5 K/uL Final  . Lymphocytes Relative 03/14/2016 14  % Final  . Lymphs Abs 03/14/2016 1.1  1.0 - 3.6 K/uL Final  . Monocytes Relative 03/14/2016 11  % Final  . Monocytes Absolute 03/14/2016 0.9  0.2 - 1.0 K/uL Final  . Eosinophils Relative 03/14/2016 4  % Final  . Eosinophils Absolute 03/14/2016 0.3  0 - 0.7 K/uL Final  . Basophils Relative 03/14/2016 1  % Final  . Basophils Absolute 03/14/2016 0.1  0 - 0.1 K/uL Final   . Sodium 03/14/2016 138  135 - 145 mmol/L Final  . Potassium 03/14/2016 4.2  3.5 - 5.1 mmol/L Final  . Chloride 03/14/2016 107  101 - 111 mmol/L Final  . CO2 03/14/2016 26  22 - 32 mmol/L Final  . Glucose, Bld 03/14/2016 205* 65 - 99 mg/dL Final  . BUN 03/14/2016 22* 6 - 20 mg/dL Final  . Creatinine, Ser 03/14/2016 1.28* 0.61 - 1.24 mg/dL Final  . Calcium 03/14/2016 8.5* 8.9 - 10.3 mg/dL Final  . Total Protein 03/14/2016 5.1* 6.5 - 8.1 g/dL Final  . Albumin 03/14/2016 2.7* 3.5 - 5.0 g/dL Final  . AST 03/14/2016 20  15 - 41 U/L Final  . ALT 03/14/2016 21  17 - 63 U/L Final  . Alkaline Phosphatase 03/14/2016 67  38 - 126 U/L Final  . Total Bilirubin 03/14/2016 0.8  0.3 - 1.2 mg/dL Final  . GFR calc non Af Amer 03/14/2016 50* >60 mL/min Final  . GFR calc Af Amer 03/14/2016 58* >60 mL/min Final   Comment: (NOTE) The eGFR has been calculated using the CKD EPI equation. This calculation has not been validated in all clinical situations. eGFR's persistently <60 mL/min signify possible Chronic Kidney Disease.   . Anion gap 03/14/2016 5  5 - 15 Final  . LDH 03/14/2016 144  98 - 192 U/L Final    Assessment:  Hayden Martin is a 83 y.o. 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  He presented with a 30-35 pound weight loss in 2-3 months.  CT-guided retroperitoneal lymph node biopsy on 01/21/2016 revealed a CD30 positive large B-cell lymphoma. Immunohistochemical studies were positive for CD20, CD30, BCL-2, BCL 6, Ki-67 (> 90%) and negative for CD10 and cyclin D1.  Bone marrow biopsy on 01/21/2016 revealed no evidence of lymphoma.  Flow cytometry was negative.  Cytogenetics were normal (46, XY).  Abdomen and pelvic CT scan on 01/07/2016 revealed multiple solid masses within the spleen.  There was extensive retroperitoneal, portal and mesenteric lymphadenopathy.  Retrocrural node measured 2.1 x 2.0 cm.  Periportal node measured 2.6 x 5.8 cm. Confluent nodal mass is in the  retroperitoneum surrounding the great vessels measured 5.6 x 3.8 cmon the left and 5.3 x 2.9 cm on the right.  Left iliac node measured 1.7 x 2.6 cm.  There was possible pleural disease on the  left.  The prostate was markedly enlarged.  There was no disease in the liver.  PET scan on 01/16/2016 revealed extensive hypermetabolic adenopathy within the neck, chest, abdomen and pelvis. The spleen was enlarged with multi focal hypermetabolic splenic lesions There was evidence of transperitoneal spread of tumor within the upper abdomen. There were bilateral pleural effusions.  Hepatitis serologies were negative on 01/16/2016. G6PD testing negative.  Echo on 01/18/2016 revealed an EF of 55-60%.   He is s/p 2 cycles of mini-RCHOP (01/23/2016 -02/15/2016).  Cycle #1 was complicated by tumor lysis syndrome, leukopenia, and symptomatic pleural effusions.  Diagnostic and 1 liter therapeutic left sided thoracentesis on 02/08/2016 revealed involvement with lymphoma.  He received OnPro Neulasta with cycle #2.  He has a history of left lower extremity thrombosis.  He has had 2 clots 1 year apart and is on life long anticoagulation.  Bilateral lower extremity duplex on 01/16/2016 revealed DVT in the left lower extremity involving the calf veins and the left popliteal vein.  IVC filterwas placed on 01/18/2016.  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  There was interval resolution of the left calf vein DVT.  He was on Xarelto.  He has been on Lovenox.  He has chronic renal insufficiency (Cr 1.3- 1.4).  Creatinine is 1.27 (CrCl 51 ml/min) today.  Chest CT angiogram on 02/02/2016 revealed no evidence of pulmonary emboli. There were bilateral pleural effusions (moderate size left and small to moderate right).  Symptomatically, his cough and green nasal discharge has improved on Levaquin.  Exam  reveals a right sided foot drop.  He denies any fevers.  Plan: 1.  Labs today:  CBC with diff, CMP, LDH, uric acid. 2.  Cycle #3 mini-RCHOP with OnPro Neulasta today. 3.  Rx: prednisone 80 mg po q day x 5 days.  Review 5 day burst with each cycle. 4.  No vincristine secondary to foot drop. 5.  Neurology consult:  right foot drop. 6.  Continue Xarelto.   7.  RTC in 3 weeks for MD assessment, labs (CBC with diff, CMP, LDH, uric acid), and cycle #4 mini-RCHOP.    Lequita Asal, MD  03/14/2016, 10:05 AM

## 2016-03-17 ENCOUNTER — Encounter: Payer: Self-pay | Admitting: Hematology and Oncology

## 2016-03-17 ENCOUNTER — Other Ambulatory Visit: Payer: Self-pay | Admitting: *Deleted

## 2016-03-17 DIAGNOSIS — M21371 Foot drop, right foot: Secondary | ICD-10-CM

## 2016-04-04 ENCOUNTER — Inpatient Hospital Stay: Payer: Medicare Other | Attending: Hematology and Oncology

## 2016-04-04 ENCOUNTER — Inpatient Hospital Stay (HOSPITAL_BASED_OUTPATIENT_CLINIC_OR_DEPARTMENT_OTHER): Payer: Medicare Other | Admitting: Hematology and Oncology

## 2016-04-04 ENCOUNTER — Other Ambulatory Visit: Payer: Self-pay | Admitting: *Deleted

## 2016-04-04 ENCOUNTER — Encounter: Payer: Self-pay | Admitting: Hematology and Oncology

## 2016-04-04 ENCOUNTER — Other Ambulatory Visit: Payer: Self-pay | Admitting: Hematology and Oncology

## 2016-04-04 ENCOUNTER — Inpatient Hospital Stay: Payer: Medicare Other

## 2016-04-04 VITALS — BP 124/67 | HR 69 | Temp 95.9°F | Resp 18 | Wt 197.5 lb

## 2016-04-04 DIAGNOSIS — N189 Chronic kidney disease, unspecified: Secondary | ICD-10-CM | POA: Diagnosis not present

## 2016-04-04 DIAGNOSIS — E1122 Type 2 diabetes mellitus with diabetic chronic kidney disease: Secondary | ICD-10-CM | POA: Insufficient documentation

## 2016-04-04 DIAGNOSIS — Z86718 Personal history of other venous thrombosis and embolism: Secondary | ICD-10-CM | POA: Insufficient documentation

## 2016-04-04 DIAGNOSIS — Z880 Allergy status to penicillin: Secondary | ICD-10-CM | POA: Diagnosis not present

## 2016-04-04 DIAGNOSIS — N400 Enlarged prostate without lower urinary tract symptoms: Secondary | ICD-10-CM | POA: Diagnosis not present

## 2016-04-04 DIAGNOSIS — Z7901 Long term (current) use of anticoagulants: Secondary | ICD-10-CM | POA: Insufficient documentation

## 2016-04-04 DIAGNOSIS — R5383 Other fatigue: Secondary | ICD-10-CM | POA: Diagnosis not present

## 2016-04-04 DIAGNOSIS — Z87891 Personal history of nicotine dependence: Secondary | ICD-10-CM | POA: Insufficient documentation

## 2016-04-04 DIAGNOSIS — Z5111 Encounter for antineoplastic chemotherapy: Secondary | ICD-10-CM | POA: Insufficient documentation

## 2016-04-04 DIAGNOSIS — Z5112 Encounter for antineoplastic immunotherapy: Secondary | ICD-10-CM | POA: Insufficient documentation

## 2016-04-04 DIAGNOSIS — C8338 Diffuse large B-cell lymphoma, lymph nodes of multiple sites: Secondary | ICD-10-CM | POA: Diagnosis not present

## 2016-04-04 DIAGNOSIS — Z794 Long term (current) use of insulin: Secondary | ICD-10-CM | POA: Insufficient documentation

## 2016-04-04 DIAGNOSIS — Z79899 Other long term (current) drug therapy: Secondary | ICD-10-CM | POA: Diagnosis not present

## 2016-04-04 DIAGNOSIS — M21371 Foot drop, right foot: Secondary | ICD-10-CM | POA: Diagnosis not present

## 2016-04-04 DIAGNOSIS — I82432 Acute embolism and thrombosis of left popliteal vein: Secondary | ICD-10-CM

## 2016-04-04 DIAGNOSIS — Z7982 Long term (current) use of aspirin: Secondary | ICD-10-CM | POA: Diagnosis not present

## 2016-04-04 DIAGNOSIS — I129 Hypertensive chronic kidney disease with stage 1 through stage 4 chronic kidney disease, or unspecified chronic kidney disease: Secondary | ICD-10-CM | POA: Insufficient documentation

## 2016-04-04 DIAGNOSIS — M479 Spondylosis, unspecified: Secondary | ICD-10-CM | POA: Insufficient documentation

## 2016-04-04 DIAGNOSIS — J90 Pleural effusion, not elsewhere classified: Secondary | ICD-10-CM | POA: Insufficient documentation

## 2016-04-04 DIAGNOSIS — M48061 Spinal stenosis, lumbar region without neurogenic claudication: Secondary | ICD-10-CM | POA: Diagnosis not present

## 2016-04-04 DIAGNOSIS — Z7689 Persons encountering health services in other specified circumstances: Secondary | ICD-10-CM | POA: Insufficient documentation

## 2016-04-04 LAB — COMPREHENSIVE METABOLIC PANEL
ALT: 30 U/L (ref 17–63)
AST: 21 U/L (ref 15–41)
Albumin: 2.8 g/dL — ABNORMAL LOW (ref 3.5–5.0)
Alkaline Phosphatase: 61 U/L (ref 38–126)
Anion gap: 4 — ABNORMAL LOW (ref 5–15)
BUN: 22 mg/dL — ABNORMAL HIGH (ref 6–20)
CO2: 26 mmol/L (ref 22–32)
Calcium: 8.4 mg/dL — ABNORMAL LOW (ref 8.9–10.3)
Chloride: 107 mmol/L (ref 101–111)
Creatinine, Ser: 1.31 mg/dL — ABNORMAL HIGH (ref 0.61–1.24)
GFR calc Af Amer: 57 mL/min — ABNORMAL LOW (ref >60)
GFR calc non Af Amer: 49 mL/min — ABNORMAL LOW (ref >60)
Glucose, Bld: 213 mg/dL — ABNORMAL HIGH (ref 65–99)
Potassium: 4.5 mmol/L (ref 3.5–5.1)
Sodium: 137 mmol/L (ref 135–145)
Total Bilirubin: 0.6 mg/dL (ref 0.3–1.2)
Total Protein: 5.1 g/dL — ABNORMAL LOW (ref 6.5–8.1)

## 2016-04-04 LAB — CBC WITH DIFFERENTIAL/PLATELET
Basophils Absolute: 0.1 10*3/uL (ref 0–0.1)
Basophils Relative: 1 %
Eosinophils Absolute: 0.1 10*3/uL (ref 0–0.7)
Eosinophils Relative: 2 %
HCT: 35.9 % — ABNORMAL LOW (ref 40.0–52.0)
Hemoglobin: 11.8 g/dL — ABNORMAL LOW (ref 13.0–18.0)
Lymphocytes Relative: 20 %
Lymphs Abs: 1.5 10*3/uL (ref 1.0–3.6)
MCH: 28.3 pg (ref 26.0–34.0)
MCHC: 33 g/dL (ref 32.0–36.0)
MCV: 85.9 fL (ref 80.0–100.0)
Monocytes Absolute: 0.8 10*3/uL (ref 0.2–1.0)
Monocytes Relative: 11 %
Neutro Abs: 4.9 10*3/uL (ref 1.4–6.5)
Neutrophils Relative %: 66 %
Platelets: 151 10*3/uL (ref 150–440)
RBC: 4.17 MIL/uL — ABNORMAL LOW (ref 4.40–5.90)
RDW: 21.4 % — ABNORMAL HIGH (ref 11.5–14.5)
WBC: 7.4 10*3/uL (ref 3.8–10.6)

## 2016-04-04 LAB — LACTATE DEHYDROGENASE: LDH: 155 U/L (ref 98–192)

## 2016-04-04 LAB — URIC ACID: Uric Acid, Serum: 6.6 mg/dL (ref 4.4–7.6)

## 2016-04-04 MED ORDER — HEPARIN SOD (PORK) LOCK FLUSH 100 UNIT/ML IV SOLN
500.0000 [IU] | Freq: Once | INTRAVENOUS | Status: AC | PRN
  Administered 2016-04-04: 500 [IU]
  Filled 2016-04-04: qty 5

## 2016-04-04 MED ORDER — SODIUM CHLORIDE 0.9 % IV SOLN: 375.0000 mg/m2 | Freq: Once | INTRAVENOUS | Status: DC

## 2016-04-04 MED ORDER — SODIUM CHLORIDE 0.9 % IV SOLN
Freq: Once | INTRAVENOUS | Status: AC
  Administered 2016-04-04: 11:00:00 via INTRAVENOUS
  Filled 2016-04-04: qty 1000

## 2016-04-04 MED ORDER — SODIUM CHLORIDE 0.9% FLUSH
10.0000 mL | INTRAVENOUS | Status: DC | PRN
  Administered 2016-04-04: 10 mL
  Filled 2016-04-04: qty 10

## 2016-04-04 MED ORDER — PALONOSETRON HCL INJECTION 0.25 MG/5ML
0.2500 mg | Freq: Once | INTRAVENOUS | Status: AC
  Administered 2016-04-04: 0.25 mg via INTRAVENOUS
  Filled 2016-04-04: qty 5

## 2016-04-04 MED ORDER — ACETAMINOPHEN 325 MG PO TABS
650.0000 mg | ORAL_TABLET | Freq: Once | ORAL | Status: AC
  Administered 2016-04-04: 650 mg via ORAL
  Filled 2016-04-04: qty 2

## 2016-04-04 MED ORDER — DOXORUBICIN HCL CHEMO IV INJECTION 2 MG/ML
25.0000 mg/m2 | Freq: Once | INTRAVENOUS | Status: AC
  Administered 2016-04-04: 54 mg via INTRAVENOUS
  Filled 2016-04-04: qty 27

## 2016-04-04 MED ORDER — RITUXIMAB CHEMO INJECTION 500 MG/50ML
375.0000 mg/m2 | Freq: Once | INTRAVENOUS | Status: AC
Start: 2016-04-04 — End: 2016-04-04
  Administered 2016-04-04: 800 mg via INTRAVENOUS
  Filled 2016-04-04: qty 50

## 2016-04-04 MED ORDER — DIPHENHYDRAMINE HCL 25 MG PO CAPS
50.0000 mg | ORAL_CAPSULE | Freq: Once | ORAL | Status: AC
  Administered 2016-04-04: 50 mg via ORAL
  Filled 2016-04-04: qty 2

## 2016-04-04 MED ORDER — ALLOPURINOL 300 MG PO TABS: 300.0000 mg | ORAL_TABLET | Freq: Every day | ORAL | 0 refills | Status: DC

## 2016-04-04 MED ORDER — PEGFILGRASTIM 6 MG/0.6ML ~~LOC~~ PSKT
6.0000 mg | PREFILLED_SYRINGE | Freq: Once | SUBCUTANEOUS | Status: AC
  Administered 2016-04-04: 6 mg via SUBCUTANEOUS
  Filled 2016-04-04: qty 0.6

## 2016-04-04 MED ORDER — SODIUM CHLORIDE 0.9 % IV SOLN
400.0000 mg/m2 | Freq: Once | INTRAVENOUS | Status: AC
  Administered 2016-04-04: 860 mg via INTRAVENOUS
  Filled 2016-04-04: qty 43

## 2016-04-04 MED ORDER — DEXAMETHASONE SODIUM PHOSPHATE 100 MG/10ML IJ SOLN: 10.0000 mg | Freq: Once | INTRAMUSCULAR | Status: DC

## 2016-04-04 MED ORDER — DEXAMETHASONE SODIUM PHOSPHATE 10 MG/ML IJ SOLN
10.0000 mg | Freq: Once | INTRAMUSCULAR | Status: AC
  Administered 2016-04-04: 10 mg via INTRAVENOUS
  Filled 2016-04-04: qty 1

## 2016-04-04 NOTE — Progress Notes (Signed)
Patient offers no complaints today.  Will need refill on prednisone.

## 2016-04-04 NOTE — Progress Notes (Signed)
[facility] day:  04/04/2016   Chief Complaint: Hayden Martin is a 83 y.o. 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  HPI:  The patient was last seen in the medical oncology clinic on 03/15/2015.  At that time, his nasal discharge and cough had improved on antibiotics.  He had a right foot drop.  He received cycle #3 mini-RCHOP.  Vincristine was held secondary to foot drop.  Neurology was consulted.  Symptomatically, he is doing well.  He denies any problems.  His son notes that he had the "blahs" for 1 week after chemotherapy.  He denies any Neulasta induced bone pain.  He is moving around better.  He comments that he is "saving on haircuts" because of the chemotherapy.  He still has right foot drop (no better and no worse).  He is not taking his allopurinol.  He has an appointment with neurology on Monday, 04/07/2016.   Past Medical History:  Diagnosis Date  . Cancer ([place])   . Clotting disorder ([place])   . Diabetes mellitus without complication ([place])   . Heart disease   . Hypertension     Past Surgical History:  Procedure Laterality Date  . HERNIA REPAIR    . PERIPHERAL VASCULAR CATHETERIZATION N/A 01/18/2016   Procedure: IVC Filter Insertion;  Surgeon: Katha Cabal, MD;  Location: [facility];  Service: Cardiovascular;  Laterality: N/A;  . PERIPHERAL VASCULAR CATHETERIZATION N/A 01/21/2016   Procedure: Glori Luis Cath Insertion;  Surgeon: Algernon Huxley, MD;  Location: [facility];  Service: Cardiovascular;  Laterality: N/A;    History reviewed. No pertinent family history.  Social History:  reports that he quit smoking about 30 years ago. He smoked 1.50 packs per day. He has quit using smokeless tobacco. His alcohol and drug histories are not on file.  He was in the [employer].  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  He was  discharged from [facility] [employer] on 02/20/2016.  He has 3 children who live locally.  The patient is accompanied by his son, [name], today.  Allergies:  Allergies  Allergen Reactions  . Codeine Itching  . Penicillins Itching    Current Medications: Current Outpatient Prescriptions  Medication Sig Dispense Refill  . carvedilol (COREG) 6.25 MG tablet TAKE ONE (1) TABLET BY MOUTH TWO (2) TIMES DAILY    . insulin aspart (NOVOLOG) 100 UNIT/ML FlexPen Inject 8 Units into the skin 3 (three) times daily with meals.     . Insulin Glargine (LANTUS Orchid) Inject into the skin. Inject 35 units subcutaneously at bedtime.    [name] ketoconazole (NIZORAL) 2 % cream APPLY AT BEDTIME DAILY TO AFFECTED AREAS    . lidocaine-prilocaine (EMLA) cream Apply 1 application topically as needed. 30 g 3  . ondansetron (ZOFRAN) 8 MG tablet Take 1 tablet (8 mg total) by mouth 2 (two) times daily as needed for nausea or vomiting. 30 tablet 0  . rivaroxaban (XARELTO) 20 MG TABS tablet Take 20 mg by mouth daily with supper.    [name] albuterol (PROVENTIL HFA) 108 (90 Base) MCG/ACT inhaler Inhale 2 puffs into the lungs every 4 (four) hours as needed for wheezing or shortness of breath. (Patient not taking: Reported on 03/14/2016) 1 Inhaler 0  . allopurinol (ZYLOPRIM) 300 MG tablet Take 1 tablet (300 mg total) by mouth daily. (Patient not  taking: Reported on 04/04/2016) 30 tablet 0  . ALPRAZolam (XANAX) 0.25 MG tablet Take 1 tablet (0.25 mg total) by mouth every 6 (six) hours as needed for anxiety. (Patient not taking: Reported on 03/14/2016) 12 tablet 0  . dronabinol (MARINOL) 2.5 MG capsule Take 1 capsule (2.5 mg total) by mouth 2 (two) times daily before lunch and supper. (Patient not taking: Reported on 03/14/2016) 60 capsule 0  . enoxaparin (LOVENOX) 30 MG/0.3ML injection Inject 0.9 mLs (90 mg total) into the skin every 12 (twelve) hours. (Patient not taking: Reported on 04/04/2016) 60 Syringe 0  . guaiFENesin (ROBITUSSIN) 100 MG/5ML SOLN Take 5  mLs (100 mg total) by mouth every 4 (four) hours as needed for cough or to loosen phlegm. (Patient not taking: Reported on 03/14/2016) 120 mL 0  . LEVEMIR 100 UNIT/ML injection     . levofloxacin (LEVAQUIN) 500 MG tablet Take 1 tablet (500 mg total) by mouth daily. (Patient not taking: Reported on 04/04/2016) 10 tablet 0  . lidocaine-prilocaine (EMLA) cream Apply 1 application topically as needed. (Patient not taking: Reported on 04/04/2016) 30 g 0  . polyethylene glycol (MIRALAX / GLYCOLAX) packet Take 17 g by mouth daily as needed for moderate constipation. (Patient not taking: Reported on 04/04/2016) 14 each 0   No current facility-administered medications for this visit.    Facility-Administered Medications Ordered in Other Visits  Medication Dose Route Frequency Provider Last Rate Last Dose  . heparin lock flush 100 unit/mL  500 Units Intracatheter Once PRN Lequita Asal, MD      . sodium chloride flush (NS) 0.9 % injection 10 mL  10 mL Intravenous PRN Cammie Sickle, MD   10 mL at 03/10/16 0949  . sodium chloride flush (NS) 0.9 % injection 10 mL  10 mL Intracatheter PRN Lequita Asal, MD   10 mL at 04/04/16 8127    Review of Systems:  GENERAL:  Feels good.  No fevers or sweats.  Weight gain of 7 pounds. PERFORMANCE STATUS (ECOG):  1 HEENT:  No visual changes, runny nose, sore throat, mouth sores or tenderness. Lungs: No shortness of breath or cough.  No hemoptysis. Cardiac:  No chest pain, palpitations, orthopnea, or PND. GI:  Appetite good.  No vomiting, diarrhea, constipation, or hematochezia. GU:  No urgency, frequency, dysuria, or hematuria. Musculoskeletal:  Back issues.  No muscle tenderness. Extremities:  No pain or swelling. Skin:  No rashes or skin changes. Neuro:  Right foot drop.  No headache, numbness or weakness, or coordination issues.  Balance issues. Endocrine:  Diabetes.  Blood sugar good.  No thyroid issues, hot flashes or night sweats. Psych:  No mood  changes, depression or anxiety. Pain:  No focal pain. Review of systems:  All other systems reviewed and found to be negative.  Physical Exam: Blood pressure 124/67, pulse 69, temperature (!) 95.9 F (35.5 C), temperature source Tympanic, resp. rate 18, weight 197 lb 8.5 oz (89.6 kg). GENERAL:  Elderly gentleman sitting comfortably in the exam room in no acute distress.  MENTAL STATUS:  Alert and oriented to person, place and time. HEAD:  Wearing an Scientist, clinical (histocompatibility and immunogenetics).  Scant gray hair.  Male pattern baldness.  Normocephalic, atraumatic, face symmetric, no Cushingoid features. EYES: Blue eyes.  Pupils equal round and reactive to light and accomodation.  No conjunctivitis or scleral icterus. ENT:  Hearing aide.  Oropharynx clear without lesion.  Tongue normal.  Mucous membranes moist.  RESPIRATORY:  Clear to auscultation without  rales, wheezes or rhonchi. CARDIOVASCULAR:  Regular rate and rhythm without murmur, rub or gallop. ABDOMEN:  Soft, non-tender, with active bowel sounds, and no appreciable hepatosplenomegaly.  No masses. SKIN:  No rashes, ulcers or lesions. EXTREMITIES:  Bilateral sock-height chronic lower extremity edema (left > right).  No skin discoloration or tenderness.  No palpable cords. LYMPH NODES: No palpable cervical, supraclavicular, axillary or inguinal adenopathy  NEUROLOGICAL:  Right sided foot drop.  Otherwise, lower extremity strength and sensation intact. PSYCH:  Appropriate.   Infusion on 04/04/2016  Component Date Value Ref Range Status  . WBC 04/04/2016 7.4  3.8 - 10.6 K/uL Final  . RBC 04/04/2016 4.17* 4.40 - 5.90 MIL/uL Final  . Hemoglobin 04/04/2016 11.8* 13.0 - 18.0 g/dL Final  . HCT 04/04/2016 35.9* 40.0 - 52.0 % Final  . MCV 04/04/2016 85.9  80.0 - 100.0 fL Final  . MCH 04/04/2016 28.3  26.0 - 34.0 pg Final  . MCHC 04/04/2016 33.0  32.0 - 36.0 g/dL Final  . RDW 04/04/2016 21.4* 11.5 - 14.5 % Final  . Platelets 04/04/2016 151  150 - 440 K/uL Final   . Neutrophils Relative % 04/04/2016 66  % Final  . Neutro Abs 04/04/2016 4.9  1.4 - 6.5 K/uL Final  . Lymphocytes Relative 04/04/2016 20  % Final  . Lymphs Abs 04/04/2016 1.5  1.0 - 3.6 K/uL Final  . Monocytes Relative 04/04/2016 11  % Final  . Monocytes Absolute 04/04/2016 0.8  0.2 - 1.0 K/uL Final  . Eosinophils Relative 04/04/2016 2  % Final  . Eosinophils Absolute 04/04/2016 0.1  0 - 0.7 K/uL Final  . Basophils Relative 04/04/2016 1  % Final  . Basophils Absolute 04/04/2016 0.1  0 - 0.1 K/uL Final  . Sodium 04/04/2016 137  135 - 145 mmol/L Final  . Potassium 04/04/2016 4.5  3.5 - 5.1 mmol/L Final  . Chloride 04/04/2016 107  101 - 111 mmol/L Final  . CO2 04/04/2016 26  22 - 32 mmol/L Final  . Glucose, Bld 04/04/2016 213* 65 - 99 mg/dL Final  . BUN 04/04/2016 22* 6 - 20 mg/dL Final  . Creatinine, Ser 04/04/2016 1.31* 0.61 - 1.24 mg/dL Final  . Calcium 04/04/2016 8.4* 8.9 - 10.3 mg/dL Final  . Total Protein 04/04/2016 5.1* 6.5 - 8.1 g/dL Final  . Albumin 04/04/2016 2.8* 3.5 - 5.0 g/dL Final  . AST 04/04/2016 21  15 - 41 U/L Final  . ALT 04/04/2016 30  17 - 63 U/L Final  . Alkaline Phosphatase 04/04/2016 61  38 - 126 U/L Final  . Total Bilirubin 04/04/2016 0.6  0.3 - 1.2 mg/dL Final  . GFR calc non Af Amer 04/04/2016 49* >60 mL/min Final  . GFR calc Af Amer 04/04/2016 57* >60 mL/min Final   Comment: (NOTE) The eGFR has been calculated using the CKD EPI equation. This calculation has not been validated in all clinical situations. eGFR's persistently <60 mL/min signify possible Chronic Kidney Disease.   . Anion gap 04/04/2016 4* 5 - 15 Final  . LDH 04/04/2016 155  98 - 192 U/L Final    Assessment:  Hayden Martin is a 83 y.o. 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  He presented with a 30-35 pound weight loss in 2-3 months.  CT-guided retroperitoneal lymph node biopsy on 01/21/2016 revealed a CD30 positive large B-cell lymphoma. Immunohistochemical studies were  positive for CD20, CD30, BCL-2, BCL 6, Ki-67 (> 90%) and negative for CD10 and cyclin D1.  Bone marrow biopsy on 01/21/2016 revealed no evidence of lymphoma.  Flow cytometry was negative.  Cytogenetics were normal (46, XY).  Abdomen and pelvic CT scan on 01/07/2016 revealed multiple solid masses within the spleen.  There was extensive retroperitoneal, portal and mesenteric lymphadenopathy.  Retrocrural node measured 2.1 x 2.0 cm.  Periportal node measured 2.6 x 5.8 cm. Confluent nodal mass is in the retroperitoneum surrounding the great vessels measured 5.6 x 3.8 cmon the left and 5.3 x 2.9 cm on the right.  Left iliac node measured 1.7 x 2.6 cm.  There was possible pleural disease on the left.  The prostate was markedly enlarged.  There was no disease in the liver.  PET scan on 01/16/2016 revealed extensive hypermetabolic adenopathy within the neck, chest, abdomen and pelvis. The spleen was enlarged with multi focal hypermetabolic splenic lesions There was evidence of transperitoneal spread of tumor within the upper abdomen. There were bilateral pleural effusions.  Hepatitis serologies were negative on 01/16/2016. G6PD testing negative.  Echo on 01/18/2016 revealed an EF of 55-60%.   He is s/p 3 cycles of mini-RCHOP (01/23/2016 - 03/14/2016).  Cycle #1 was complicated by tumor lysis syndrome, leukopenia, and symptomatic pleural effusions.  Diagnostic and 1 liter therapeutic left sided thoracentesis on 02/08/2016 revealed involvement with lymphoma.  He received OnPro Neulasta with cycle #2.  He has a history of left lower extremity thrombosis.  He has had 2 clots 1 year apart and is on life long anticoagulation.  Bilateral lower extremity duplex on 01/16/2016 revealed DVT in the left lower extremity involving the calf veins and the left popliteal vein.  IVC filterwas placed on 01/18/2016.  Bilateral lower extremity duplex on 02/05/2016 revealed partially recanalized subacute to chronic DVT in  the left popliteal vein with decreased thrombus burden.  There was interval resolution of the left calf vein DVT.  He is on Xarelto.  He has chronic renal insufficiency (Cr 1.3- 1.4).  Creatinine is 1.27 (CrCl 51 ml/min) today.  Chest CT angiogram on 02/02/2016 revealed no evidence of pulmonary emboli. There were bilateral pleural effusions (moderate size left and small to moderate right).  Symptomatically, he has a persistent right foot drop.  Plan: 1.  Labs today:  CBC with diff, CMP, LDH, uric acid. 2.  Discuss plan to continue to hold vincristine secondary to foot drop.  Await follow-up with neurology. 3.  Cycle #4 mini-RCHOP (no vincristine) with OnPro Neulasta today. 4.  Schedule restaging PET scan on 04/23/2016. 5.  Rx: prednisone 80 mg po q day x 5 days.   6.  Rx:  Allopurinol 300 mg a day. 7.  Neurology consult on 04/07/2016. 8.  Continue Xarelto.   9.  RTC in 3 weeks for MD assessment, labs (CBC with diff, CMP, LDH, uric acid), review of PET scan, and cycle #5 mini-RCHOP.   Lequita Asal, MD  04/04/2016, 9:58 AM

## 2016-04-07 ENCOUNTER — Other Ambulatory Visit: Payer: Self-pay | Admitting: Neurology

## 2016-04-07 DIAGNOSIS — M21371 Foot drop, right foot: Secondary | ICD-10-CM

## 2016-04-09 ENCOUNTER — Other Ambulatory Visit: Payer: Self-pay | Admitting: Neurology

## 2016-04-09 DIAGNOSIS — M21371 Foot drop, right foot: Secondary | ICD-10-CM

## 2016-04-14 ENCOUNTER — Other Ambulatory Visit: Payer: Self-pay | Admitting: *Deleted

## 2016-04-14 NOTE — Telephone Encounter (Signed)
Asking for a refill for his ondansetron, but wants it from the [place], I called the [place] and they will handle the refill request. I did a conference call with Hayden Martin and the [place]

## 2016-04-17 ENCOUNTER — Ambulatory Visit: Payer: Medicare Other

## 2016-04-18 ENCOUNTER — Ambulatory Visit
Admission: RE | Admit: 2016-04-18 | Discharge: 2016-04-18 | Disposition: A | Discharge status: Home / Self Care | Payer: Medicare Other | Source: Ambulatory Visit | Attending: Neurology | Admitting: Neurology

## 2016-04-18 DIAGNOSIS — M48061 Spinal stenosis, lumbar region without neurogenic claudication: Secondary | ICD-10-CM | POA: Insufficient documentation

## 2016-04-18 DIAGNOSIS — M5136 Other intervertebral disc degeneration, lumbar region: Secondary | ICD-10-CM | POA: Insufficient documentation

## 2016-04-18 DIAGNOSIS — M21371 Foot drop, right foot: Secondary | ICD-10-CM | POA: Insufficient documentation

## 2016-04-18 DIAGNOSIS — M8938 Hypertrophy of bone, other site: Secondary | ICD-10-CM | POA: Insufficient documentation

## 2016-04-18 DIAGNOSIS — M47896 Other spondylosis, lumbar region: Secondary | ICD-10-CM | POA: Insufficient documentation

## 2016-04-18 DIAGNOSIS — M4807 Spinal stenosis, lumbosacral region: Secondary | ICD-10-CM | POA: Diagnosis not present

## 2016-04-23 ENCOUNTER — Ambulatory Visit
Admission: RE | Admit: 2016-04-23 | Discharge: 2016-04-23 | Disposition: A | Discharge status: Home / Self Care | Payer: Medicare Other | Source: Ambulatory Visit | Attending: Hematology and Oncology | Admitting: Hematology and Oncology

## 2016-04-23 DIAGNOSIS — C8338 Diffuse large B-cell lymphoma, lymph nodes of multiple sites: Secondary | ICD-10-CM | POA: Diagnosis not present

## 2016-04-23 DIAGNOSIS — I82432 Acute embolism and thrombosis of left popliteal vein: Secondary | ICD-10-CM

## 2016-04-23 DIAGNOSIS — J90 Pleural effusion, not elsewhere classified: Secondary | ICD-10-CM | POA: Diagnosis not present

## 2016-04-23 DIAGNOSIS — M21371 Foot drop, right foot: Secondary | ICD-10-CM | POA: Insufficient documentation

## 2016-04-23 LAB — GLUCOSE, CAPILLARY: Glucose-Capillary: 133 mg/dL — ABNORMAL HIGH (ref 65–99)

## 2016-04-23 MED ORDER — FLUDEOXYGLUCOSE F - 18 (FDG) INJECTION
12.0000 | Freq: Once | INTRAVENOUS | Status: AC | PRN
  Administered 2016-04-23: 12.48 via INTRAVENOUS

## 2016-04-25 ENCOUNTER — Inpatient Hospital Stay: Payer: Medicare Other

## 2016-04-25 ENCOUNTER — Other Ambulatory Visit: Payer: Self-pay | Admitting: Hematology and Oncology

## 2016-04-25 ENCOUNTER — Inpatient Hospital Stay (HOSPITAL_BASED_OUTPATIENT_CLINIC_OR_DEPARTMENT_OTHER): Payer: Medicare Other | Admitting: Hematology and Oncology

## 2016-04-25 VITALS — BP 115/66 | HR 70 | Temp 97.6°F | Resp 19 | Wt 199.7 lb

## 2016-04-25 DIAGNOSIS — I129 Hypertensive chronic kidney disease with stage 1 through stage 4 chronic kidney disease, or unspecified chronic kidney disease: Secondary | ICD-10-CM | POA: Diagnosis not present

## 2016-04-25 DIAGNOSIS — M21371 Foot drop, right foot: Secondary | ICD-10-CM | POA: Diagnosis not present

## 2016-04-25 DIAGNOSIS — C8338 Diffuse large B-cell lymphoma, lymph nodes of multiple sites: Secondary | ICD-10-CM

## 2016-04-25 DIAGNOSIS — Z5111 Encounter for antineoplastic chemotherapy: Secondary | ICD-10-CM

## 2016-04-25 DIAGNOSIS — Z794 Long term (current) use of insulin: Secondary | ICD-10-CM

## 2016-04-25 DIAGNOSIS — Z7901 Long term (current) use of anticoagulants: Secondary | ICD-10-CM

## 2016-04-25 DIAGNOSIS — Z86718 Personal history of other venous thrombosis and embolism: Secondary | ICD-10-CM

## 2016-04-25 DIAGNOSIS — M48061 Spinal stenosis, lumbar region without neurogenic claudication: Secondary | ICD-10-CM

## 2016-04-25 DIAGNOSIS — J90 Pleural effusion, not elsewhere classified: Secondary | ICD-10-CM | POA: Diagnosis not present

## 2016-04-25 DIAGNOSIS — Z79899 Other long term (current) drug therapy: Secondary | ICD-10-CM

## 2016-04-25 DIAGNOSIS — R5383 Other fatigue: Secondary | ICD-10-CM

## 2016-04-25 DIAGNOSIS — Z87891 Personal history of nicotine dependence: Secondary | ICD-10-CM

## 2016-04-25 DIAGNOSIS — N400 Enlarged prostate without lower urinary tract symptoms: Secondary | ICD-10-CM

## 2016-04-25 DIAGNOSIS — Z880 Allergy status to penicillin: Secondary | ICD-10-CM

## 2016-04-25 DIAGNOSIS — M479 Spondylosis, unspecified: Secondary | ICD-10-CM | POA: Diagnosis not present

## 2016-04-25 DIAGNOSIS — I82432 Acute embolism and thrombosis of left popliteal vein: Secondary | ICD-10-CM

## 2016-04-25 DIAGNOSIS — Z7982 Long term (current) use of aspirin: Secondary | ICD-10-CM

## 2016-04-25 DIAGNOSIS — N189 Chronic kidney disease, unspecified: Secondary | ICD-10-CM

## 2016-04-25 DIAGNOSIS — E1122 Type 2 diabetes mellitus with diabetic chronic kidney disease: Secondary | ICD-10-CM

## 2016-04-25 LAB — CBC WITH DIFFERENTIAL/PLATELET
Basophils Absolute: 0.1 10*3/uL (ref 0–0.1)
Basophils Relative: 1 %
Eosinophils Absolute: 0.1 10*3/uL (ref 0–0.7)
Eosinophils Relative: 2 %
HCT: 35.4 % — ABNORMAL LOW (ref 40.0–52.0)
Hemoglobin: 11.9 g/dL — ABNORMAL LOW (ref 13.0–18.0)
Lymphocytes Relative: 18 %
Lymphs Abs: 1.4 10*3/uL (ref 1.0–3.6)
MCH: 29.3 pg (ref 26.0–34.0)
MCHC: 33.5 g/dL (ref 32.0–36.0)
MCV: 87.5 fL (ref 80.0–100.0)
Monocytes Absolute: 0.8 10*3/uL (ref 0.2–1.0)
Monocytes Relative: 10 %
Neutro Abs: 5.7 10*3/uL (ref 1.4–6.5)
Neutrophils Relative %: 69 %
Platelets: 151 10*3/uL (ref 150–440)
RBC: 4.04 MIL/uL — ABNORMAL LOW (ref 4.40–5.90)
RDW: 19.6 % — ABNORMAL HIGH (ref 11.5–14.5)
WBC: 8.1 10*3/uL (ref 3.8–10.6)

## 2016-04-25 LAB — COMPREHENSIVE METABOLIC PANEL
ALT: 33 U/L (ref 17–63)
AST: 24 U/L (ref 15–41)
Albumin: 3.2 g/dL — ABNORMAL LOW (ref 3.5–5.0)
Alkaline Phosphatase: 63 U/L (ref 38–126)
Anion gap: 5 (ref 5–15)
BUN: 31 mg/dL — ABNORMAL HIGH (ref 6–20)
CO2: 28 mmol/L (ref 22–32)
Calcium: 8.7 mg/dL — ABNORMAL LOW (ref 8.9–10.3)
Chloride: 107 mmol/L (ref 101–111)
Creatinine, Ser: 1.25 mg/dL — ABNORMAL HIGH (ref 0.61–1.24)
GFR calc Af Amer: >60 mL/min (ref >60)
GFR calc non Af Amer: 52 mL/min — ABNORMAL LOW (ref >60)
Glucose, Bld: 154 mg/dL — ABNORMAL HIGH (ref 65–99)
Potassium: 4 mmol/L (ref 3.5–5.1)
Sodium: 140 mmol/L (ref 135–145)
Total Bilirubin: 0.7 mg/dL (ref 0.3–1.2)
Total Protein: 5.6 g/dL — ABNORMAL LOW (ref 6.5–8.1)

## 2016-04-25 LAB — LACTATE DEHYDROGENASE: LDH: 153 U/L (ref 98–192)

## 2016-04-25 LAB — URIC ACID: Uric Acid, Serum: 5.1 mg/dL (ref 4.4–7.6)

## 2016-04-25 MED ORDER — SODIUM CHLORIDE 0.9% FLUSH
10.0000 mL | INTRAVENOUS | Status: DC | PRN
  Administered 2016-04-25 (×2): 10 mL via INTRAVENOUS
  Filled 2016-04-25: qty 10

## 2016-04-25 MED ORDER — PEGFILGRASTIM 6 MG/0.6ML ~~LOC~~ PSKT
6.0000 mg | PREFILLED_SYRINGE | Freq: Once | SUBCUTANEOUS | Status: AC
  Administered 2016-04-25: 6 mg via SUBCUTANEOUS
  Filled 2016-04-25: qty 0.6

## 2016-04-25 MED ORDER — ACETAMINOPHEN 325 MG PO TABS
650.0000 mg | ORAL_TABLET | Freq: Once | ORAL | Status: AC
  Administered 2016-04-25: 650 mg via ORAL
  Filled 2016-04-25: qty 2

## 2016-04-25 MED ORDER — DEXAMETHASONE SODIUM PHOSPHATE 10 MG/ML IJ SOLN
10.0000 mg | Freq: Once | INTRAMUSCULAR | Status: AC
  Administered 2016-04-25: 10 mg via INTRAVENOUS
  Filled 2016-04-25: qty 1

## 2016-04-25 MED ORDER — PALONOSETRON HCL INJECTION 0.25 MG/5ML
0.2500 mg | Freq: Once | INTRAVENOUS | Status: AC
  Administered 2016-04-25: 0.25 mg via INTRAVENOUS
  Filled 2016-04-25: qty 5

## 2016-04-25 MED ORDER — SODIUM CHLORIDE 0.9 % IV SOLN
Freq: Once | INTRAVENOUS | Status: AC
  Administered 2016-04-25: 10:00:00 via INTRAVENOUS
  Filled 2016-04-25: qty 1000

## 2016-04-25 MED ORDER — DIPHENHYDRAMINE HCL 25 MG PO CAPS
50.0000 mg | ORAL_CAPSULE | Freq: Once | ORAL | Status: AC
  Administered 2016-04-25: 50 mg via ORAL
  Filled 2016-04-25: qty 2

## 2016-04-25 MED ORDER — HEPARIN SOD (PORK) LOCK FLUSH 100 UNIT/ML IV SOLN
500.0000 [IU] | Freq: Once | INTRAVENOUS | Status: AC
  Administered 2016-04-25: 500 [IU] via INTRAVENOUS
  Filled 2016-04-25: qty 5

## 2016-04-25 MED ORDER — DOXORUBICIN HCL CHEMO IV INJECTION 2 MG/ML
25.0000 mg/m2 | Freq: Once | INTRAVENOUS | Status: AC
  Administered 2016-04-25: 54 mg via INTRAVENOUS
  Filled 2016-04-25: qty 27

## 2016-04-25 MED ORDER — SODIUM CHLORIDE 0.9 % IV SOLN
375.0000 mg/m2 | Freq: Once | INTRAVENOUS | Status: AC
  Administered 2016-04-25: 800 mg via INTRAVENOUS
  Filled 2016-04-25: qty 50

## 2016-04-25 MED ORDER — SODIUM CHLORIDE 0.9 % IV SOLN: 375.0000 mg/m2 | Freq: Once | INTRAVENOUS | Status: DC

## 2016-04-25 MED ORDER — SODIUM CHLORIDE 0.9 % IV SOLN
400.0000 mg/m2 | Freq: Once | INTRAVENOUS | Status: AC
  Administered 2016-04-25: 860 mg via INTRAVENOUS
  Filled 2016-04-25: qty 43

## 2016-04-25 NOTE — Progress Notes (Signed)
[facility] day:  04/25/2016   Chief Complaint: Hayden Martin is a 83 y.o. 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  HPI:  The patient was last seen in the medical oncology clinic on 04/05/2015.  At that time, he was doing well except for a persistent left foot drop.  He denied any B symptoms.  Exam revealed no adenopathy.  He received cycle #4 mini-RCHOP with Neulasta support.  He was seen by Dr. Manuella Ghazi, neurologist, on 04/07/2016 for his foot drop.  A work-up was initiated.  He has been scheduled for a lumbar spine MRI and nerve conduction studies.  Lumbar spine MRI on 04/18/2016 identified mild spinal canal stenosis at L4-L5 due to combination of disc disease and facet hypertrophy, mild right L5-S1 neural foraminal narrowing and multilevel facet arthrosis.  He will have nerve conduction study on 04/28/2016.   Per prior plan, he underwent restaging PET scan on 04/23/2016.  PET scan revealed an excellent response to treatment. The extensive adenopathy seen on the prior PET-CT showed remarkable improvement.  There was no residual metabolically active lymphoma is identified. There was a moderate left pleural effusion and small right pleural effusion.  In the interim, he has felt "great". He continues to have one "blah" day where is experiences mild fatigue after chemo. He denies nausea/vomiting. He was recently seen at the [place], where is was given a shoe brace to help with his foot drop.    Past Medical History:  Diagnosis Date  . Cancer ([place])   . Clotting disorder ([place])   . Diabetes mellitus without complication ([place])   . Heart disease   . Hypertension     Past Surgical History:  Procedure Laterality Date  . HERNIA REPAIR    . PERIPHERAL VASCULAR CATHETERIZATION N/A 01/18/2016   Procedure: IVC Filter Insertion;  Surgeon: Katha Cabal, MD;  Location: [facility];   Service: Cardiovascular;  Laterality: N/A;  . PERIPHERAL VASCULAR CATHETERIZATION N/A 01/21/2016   Procedure: Glori Luis Cath Insertion;  Surgeon: Algernon Huxley, MD;  Location: [facility];  Service: Cardiovascular;  Laterality: N/A;    No family history on file.  Social History:  reports that he quit smoking about 30 years ago. He smoked 1.50 packs per day. He has quit using smokeless tobacco. His alcohol and drug histories are not on file.  He was in the [employer].  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  He was discharged from [facility] on 02/20/2016.  He has 3 children who live locally.  The patient is accompanied by his daughter, today.  Allergies:  Allergies  Allergen Reactions  . Codeine Itching  . Penicillins Itching    Current Medications: Current Outpatient Prescriptions  Medication Sig Dispense Refill  . allopurinol (ZYLOPRIM) 300 MG tablet Take 1 tablet (300 mg total) by mouth daily. 30 tablet 0  . aspirin EC 81 MG tablet Take 81 mg by mouth daily.    [name] atorvastatin (LIPITOR) 40 MG tablet Take 20 mg by mouth daily.    . carvedilol (COREG) 6.25 MG tablet TAKE ONE (1) TABLET BY MOUTH TWO (2) TIMES DAILY    . Cholecalciferol (VITAMIN D3) 10000 units TABS Take by mouth.    . furosemide (LASIX) 20 MG tablet Take 20 mg by mouth.    . Insulin Glargine (LANTUS [place]) Inject into  the skin. Inject 35 units subcutaneously at bedtime.    . Insulin NPH Isophane & Regular (NOVOLIN 70/30 [place]) Inject 8 Units into the skin 3 (three) times daily.    [name] ketoconazole (NIZORAL) 2 % cream APPLY AT BEDTIME DAILY TO AFFECTED AREAS    . lidocaine-prilocaine (EMLA) cream Apply 1 application topically as needed. 30 g 3  . ondansetron (ZOFRAN) 8 MG tablet Take 1 tablet (8 mg total) by mouth 2 (two) times daily as needed for nausea or vomiting. 30 tablet 0  . predniSONE (DELTASONE) 20 MG tablet Take 80 mg by mouth daily with breakfast. Take for 5 days starting day after chemo     . rivaroxaban (XARELTO) 20 MG TABS tablet Take 20 mg by mouth daily with supper.    . Vitamin D, Ergocalciferol, (DRISDOL) 50000 units CAPS capsule Take by mouth.    [name] albuterol (PROVENTIL HFA) 108 (90 Base) MCG/ACT inhaler Inhale 2 puffs into the lungs every 4 (four) hours as needed for wheezing or shortness of breath. (Patient not taking: Reported on 03/14/2016) 1 Inhaler 0  . dronabinol (MARINOL) 2.5 MG capsule Take 1 capsule (2.5 mg total) by mouth 2 (two) times daily before lunch and supper. (Patient not taking: Reported on 03/14/2016) 60 capsule 0   No current facility-administered medications for this visit.    Facility-Administered Medications Ordered in Other Visits  Medication Dose Route Frequency Provider Last Rate Last Dose  . heparin lock flush 100 unit/mL  500 Units Intravenous Once Cammie Sickle, MD      . pegfilgrastim (NEULASTA ONPRO KIT) injection 6 mg  6 mg Subcutaneous Once Lequita Asal, MD      . riTUXimab (RITUXAN) 800 mg in sodium chloride 0.9 % 170 mL chemo infusion  375 mg/m2 Intravenous Once Lequita Asal, MD   Stopped at 04/25/16 1315  . sodium chloride flush (NS) 0.9 % injection 10 mL  10 mL Intravenous PRN Cammie Sickle, MD   10 mL at 03/10/16 0949  . sodium chloride flush (NS) 0.9 % injection 10 mL  10 mL Intravenous PRN Cammie Sickle, MD   10 mL at 04/25/16 1012    Review of Systems:  GENERAL:  Feels great.  No fevers or sweats.  Weight gain of 2 pounds. PERFORMANCE STATUS (ECOG):  1 HEENT:  No visual changes, runny nose, sore throat, mouth sores or tenderness. Lungs: No shortness of breath or cough.  No hemoptysis. Cardiac:  No chest pain, palpitations, orthopnea, or PND. GI:  Appetite good.  No vomiting, diarrhea, constipation, or hematochezia. GU:  No urgency, frequency, dysuria, or hematuria. Musculoskeletal:  Back issues.  No muscle tenderness. Extremities:  No pain or swelling. Skin:  No rashes or skin changes. Neuro:   Right foot drop.  No headache, numbness or weakness, or coordination issues.  Balance issues. Endocrine:  Diabetes.  Blood sugar good.  No thyroid issues, hot flashes or night sweats. Psych:  No mood changes, depression or anxiety. Pain:  No focal pain. Review of systems:  All other systems reviewed and found to be negative.  Physical Exam: Blood pressure 115/66, pulse 70, temperature 97.6 F (36.4 C), temperature source Tympanic, resp. rate 19, weight 199 lb 11.8 oz (90.6 kg). GENERAL:  Elderly gentleman sitting comfortably in the exam room in no acute distress.  MENTAL STATUS:  Alert and oriented to person, place and time. HEAD:  Wearing an Scientist, clinical (histocompatibility and immunogenetics).  Scant gray hair.  Male pattern baldness.  Normocephalic, atraumatic, face symmetric, no Cushingoid features. EYES: Blue eyes.  Pupils equal round and reactive to light and accomodation.  No conjunctivitis or scleral icterus. ENT:  Hearing aide.  Oropharynx clear without lesion.  Tongue normal.  Mucous membranes moist.  RESPIRATORY:  Decreased breath sounds left base.  Clear to auscultation without rales, wheezes or rhonchi. CARDIOVASCULAR:  Regular rate and rhythm without murmur, rub or gallop. ABDOMEN:  Soft, non-tender, with active bowel sounds, and no appreciable hepatosplenomegaly.  No masses. SKIN:  No rashes, ulcers or lesions. EXTREMITIES:  Bilateral sock-height chronic lower extremity edema (left > right).  No skin discoloration or tenderness.  No palpable cords. LYMPH NODES: No palpable cervical, supraclavicular, axillary or inguinal adenopathy  NEUROLOGICAL:  Right sided foot drop.  Otherwise, lower extremity strength and sensation intact. PSYCH:  Appropriate.   Infusion on 04/25/2016  Component Date Value Ref Range Status  . WBC 04/25/2016 8.1  3.8 - 10.6 K/uL Final  . RBC 04/25/2016 4.04* 4.40 - 5.90 MIL/uL Final  . Hemoglobin 04/25/2016 11.9* 13.0 - 18.0 g/dL Final  . HCT 04/25/2016 35.4* 40.0 - 52.0 % Final  .  MCV 04/25/2016 87.5  80.0 - 100.0 fL Final  . MCH 04/25/2016 29.3  26.0 - 34.0 pg Final  . MCHC 04/25/2016 33.5  32.0 - 36.0 g/dL Final  . RDW 04/25/2016 19.6* 11.5 - 14.5 % Final  . Platelets 04/25/2016 151  150 - 440 K/uL Final  . Neutrophils Relative % 04/25/2016 69  % Final  . Neutro Abs 04/25/2016 5.7  1.4 - 6.5 K/uL Final  . Lymphocytes Relative 04/25/2016 18  % Final  . Lymphs Abs 04/25/2016 1.4  1.0 - 3.6 K/uL Final  . Monocytes Relative 04/25/2016 10  % Final  . Monocytes Absolute 04/25/2016 0.8  0.2 - 1.0 K/uL Final  . Eosinophils Relative 04/25/2016 2  % Final  . Eosinophils Absolute 04/25/2016 0.1  0 - 0.7 K/uL Final  . Basophils Relative 04/25/2016 1  % Final  . Basophils Absolute 04/25/2016 0.1  0 - 0.1 K/uL Final  . Sodium 04/25/2016 140  135 - 145 mmol/L Final  . Potassium 04/25/2016 4.0  3.5 - 5.1 mmol/L Final  . Chloride 04/25/2016 107  101 - 111 mmol/L Final  . CO2 04/25/2016 28  22 - 32 mmol/L Final  . Glucose, Bld 04/25/2016 154* 65 - 99 mg/dL Final  . BUN 04/25/2016 31* 6 - 20 mg/dL Final  . Creatinine, Ser 04/25/2016 1.25* 0.61 - 1.24 mg/dL Final  . Calcium 04/25/2016 8.7* 8.9 - 10.3 mg/dL Final  . Total Protein 04/25/2016 5.6* 6.5 - 8.1 g/dL Final  . Albumin 04/25/2016 3.2* 3.5 - 5.0 g/dL Final  . AST 04/25/2016 24  15 - 41 U/L Final  . ALT 04/25/2016 33  17 - 63 U/L Final  . Alkaline Phosphatase 04/25/2016 63  38 - 126 U/L Final  . Total Bilirubin 04/25/2016 0.7  0.3 - 1.2 mg/dL Final  . GFR calc non Af Amer 04/25/2016 52* >60 mL/min Final  . GFR calc Af Amer 04/25/2016 >60  >60 mL/min Final   Comment: (NOTE) The eGFR has been calculated using the CKD EPI equation. This calculation has not been validated in all clinical situations. eGFR's persistently <60 mL/min signify possible Chronic Kidney Disease.   . Anion gap 04/25/2016 5  5 - 15 Final  . LDH 04/25/2016 153  98 - 192 U/L Final  . Uric Acid, Serum 04/25/2016 5.1  4.4 - 7.6 mg/dL  Final  Hospital  Outpatient Visit on 04/23/2016  Component Date Value Ref Range Status  . Glucose-Capillary 04/23/2016 133* 65 - 99 mg/dL Final    Assessment:  COSMO TETREAULT is a 83 y.o. 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  He presented with a 30-35 pound weight loss in 2-3 months.  CT-guided retroperitoneal lymph node biopsy on 01/21/2016 revealed a CD30 positive large B-cell lymphoma. Immunohistochemical studies were positive for CD20, CD30, BCL-2, BCL 6, Ki-67 (> 90%) and negative for CD10 and cyclin D1.  Bone marrow biopsy on 01/21/2016 revealed no evidence of lymphoma.  Flow cytometry was negative.  Cytogenetics were normal (46, XY).  Abdomen and pelvic CT scan on 01/07/2016 revealed multiple solid masses within the spleen.  There was extensive retroperitoneal, portal and mesenteric lymphadenopathy.  Retrocrural node measured 2.1 x 2.0 cm.  Periportal node measured 2.6 x 5.8 cm. Confluent nodal mass is in the retroperitoneum surrounding the great vessels measured 5.6 x 3.8 cmon the left and 5.3 x 2.9 cm on the right.  Left iliac node measured 1.7 x 2.6 cm.  There was possible pleural disease on the left.  The prostate was markedly enlarged.  There was no disease in the liver.  PET scan on 01/16/2016 revealed extensive hypermetabolic adenopathy within the neck, chest, abdomen and pelvis. The spleen was enlarged with multi focal hypermetabolic splenic lesions There was evidence of transperitoneal spread of tumor within the upper abdomen. There were bilateral pleural effusions.  Hepatitis serologies were negative on 01/16/2016. G6PD testing negative.  Echo on 01/18/2016 revealed an EF of 55-60%.   He is s/p 4 cycles of mini-RCHOP (01/23/2016 - 04/04/2016).  Cycle #1 was complicated by tumor lysis syndrome, leukopenia, and symptomatic pleural effusions.  Diagnostic and 1 liter therapeutic left sided thoracentesis on 02/08/2016 revealed involvement with lymphoma.  He received OnPro  Neulasta with cycle #2.  Vincristine has been held since cycle #3 secondary to right sided foot drop.  PET scan on 04/23/2016 revealed remarkable improvement in his extensive adenopathy.  There was no residual metabolically active lymphoma. There was a moderate left pleural effusion and small right pleural effusion.  He has a history of left lower extremity thrombosis.  He has had 2 clots 1 year apart and is on life long anticoagulation.  Bilateral lower extremity duplex on 01/16/2016 revealed DVT in the left lower extremity involving the calf veins and the left popliteal vein.  IVC filterwas placed on 01/18/2016.  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  There was interval resolution of the left calf vein DVT.  He is on Xarelto.  He has chronic renal insufficiency (Cr 1.3- 1.4).  Creatinine is 1.25 (CrCl 51 ml/min) today.  Chest CT angiogram on 02/02/2016 revealed no evidence of pulmonary emboli. There were bilateral pleural effusions (moderate size left and small to moderate right).  He has right sided foot drop.  Lumbar spine MRI on 04/18/2016 revealed no evidence of metastasis.  There was mild spinal canal stenosis at L4-L5 due to combination of disc disease and facet hypertrophy, mild right L5-S1 neural foraminal narrowing and multilevel facet arthrosis.  He is followed by neurology.    Symptomatically, he feels great.  He has no B symptoms.  Exam reveals no adenopathy. Hemoglobin stable at 11.9.  Plan: 1.  Labs today:  CBC with diff, CMP, LDH, uric acid. 2.  Review PET scan.  Excellent response to therapy.  Discuss completion of last 2 cycles of mini-RCHOP. 3.  Discuss neurology consult.  Work-up underway.  Continue to hold vincristine. 4.  Cycle #5 mini-RCHOP (no vincristine) with OnPro Neulasta today. 5.  Prednisone 80 mg po q day x 5 days.   6.  Continue Xarelto.    7.  Schedule follow-up echo prior to completion of therapy. 8.  RTC in 3 weeks for MD assessment, labs (CBC with diff, CMP, LDH, uric acid), and cycle #6 mini-RCHOP.  The patient was seen and examined.  The assessment and plan was discussed with the patient.  Multiple questions were asked and answered.    Faythe Casa, NP  Lequita Asal, MD  04/25/2016, 1:02 PM

## 2016-04-26 ENCOUNTER — Encounter: Payer: Self-pay | Admitting: Hematology and Oncology

## 2016-04-26 DIAGNOSIS — M21371 Foot drop, right foot: Secondary | ICD-10-CM | POA: Insufficient documentation

## 2016-05-02 ENCOUNTER — Other Ambulatory Visit: Payer: Self-pay | Admitting: Hematology and Oncology

## 2016-05-12 ENCOUNTER — Ambulatory Visit
Admission: RE | Admit: 2016-05-12 | Discharge: 2016-05-12 | Disposition: A | Discharge status: Home / Self Care | Payer: Medicare Other | Source: Ambulatory Visit | Attending: Hematology and Oncology | Admitting: Hematology and Oncology

## 2016-05-12 DIAGNOSIS — Z09 Encounter for follow-up examination after completed treatment for conditions other than malignant neoplasm: Secondary | ICD-10-CM | POA: Diagnosis not present

## 2016-05-12 DIAGNOSIS — C8338 Diffuse large B-cell lymphoma, lymph nodes of multiple sites: Secondary | ICD-10-CM | POA: Diagnosis not present

## 2016-05-12 DIAGNOSIS — Z08 Encounter for follow-up examination after completed treatment for malignant neoplasm: Secondary | ICD-10-CM | POA: Diagnosis not present

## 2016-05-12 DIAGNOSIS — I517 Cardiomegaly: Secondary | ICD-10-CM | POA: Diagnosis not present

## 2016-05-12 NOTE — Progress Notes (Signed)
*  PRELIMINARY RESULTS* Echocardiogram 2D Echocardiogram has been performed.  Hayden Martin 05/12/2016, 10:18 AM

## 2016-05-16 ENCOUNTER — Other Ambulatory Visit: Payer: Self-pay | Admitting: Hematology and Oncology

## 2016-05-16 ENCOUNTER — Inpatient Hospital Stay (HOSPITAL_BASED_OUTPATIENT_CLINIC_OR_DEPARTMENT_OTHER): Payer: Medicare Other | Admitting: Oncology

## 2016-05-16 ENCOUNTER — Inpatient Hospital Stay: Payer: Medicare Other

## 2016-05-16 ENCOUNTER — Inpatient Hospital Stay: Payer: Medicare Other | Attending: Hematology and Oncology

## 2016-05-16 VITALS — BP 133/65 | HR 71 | Temp 97.9°F | Resp 18 | Wt 203.5 lb

## 2016-05-16 DIAGNOSIS — Z86718 Personal history of other venous thrombosis and embolism: Secondary | ICD-10-CM

## 2016-05-16 DIAGNOSIS — Z7982 Long term (current) use of aspirin: Secondary | ICD-10-CM | POA: Diagnosis not present

## 2016-05-16 DIAGNOSIS — R634 Abnormal weight loss: Secondary | ICD-10-CM

## 2016-05-16 DIAGNOSIS — C8338 Diffuse large B-cell lymphoma, lymph nodes of multiple sites: Secondary | ICD-10-CM

## 2016-05-16 DIAGNOSIS — Z794 Long term (current) use of insulin: Secondary | ICD-10-CM | POA: Diagnosis not present

## 2016-05-16 DIAGNOSIS — J90 Pleural effusion, not elsewhere classified: Secondary | ICD-10-CM | POA: Insufficient documentation

## 2016-05-16 DIAGNOSIS — R011 Cardiac murmur, unspecified: Secondary | ICD-10-CM | POA: Diagnosis not present

## 2016-05-16 DIAGNOSIS — M21371 Foot drop, right foot: Secondary | ICD-10-CM | POA: Insufficient documentation

## 2016-05-16 DIAGNOSIS — N189 Chronic kidney disease, unspecified: Secondary | ICD-10-CM | POA: Diagnosis not present

## 2016-05-16 DIAGNOSIS — Z86711 Personal history of pulmonary embolism: Secondary | ICD-10-CM

## 2016-05-16 DIAGNOSIS — Z5111 Encounter for antineoplastic chemotherapy: Secondary | ICD-10-CM | POA: Insufficient documentation

## 2016-05-16 DIAGNOSIS — Z87891 Personal history of nicotine dependence: Secondary | ICD-10-CM

## 2016-05-16 DIAGNOSIS — E1122 Type 2 diabetes mellitus with diabetic chronic kidney disease: Secondary | ICD-10-CM | POA: Diagnosis not present

## 2016-05-16 DIAGNOSIS — Z7901 Long term (current) use of anticoagulants: Secondary | ICD-10-CM

## 2016-05-16 DIAGNOSIS — Z880 Allergy status to penicillin: Secondary | ICD-10-CM | POA: Insufficient documentation

## 2016-05-16 DIAGNOSIS — I129 Hypertensive chronic kidney disease with stage 1 through stage 4 chronic kidney disease, or unspecified chronic kidney disease: Secondary | ICD-10-CM

## 2016-05-16 DIAGNOSIS — Z79899 Other long term (current) drug therapy: Secondary | ICD-10-CM | POA: Diagnosis not present

## 2016-05-16 DIAGNOSIS — Z7952 Long term (current) use of systemic steroids: Secondary | ICD-10-CM | POA: Insufficient documentation

## 2016-05-16 LAB — COMPREHENSIVE METABOLIC PANEL
ALT: 36 U/L (ref 17–63)
AST: 27 U/L (ref 15–41)
Albumin: 3.1 g/dL — ABNORMAL LOW (ref 3.5–5.0)
Alkaline Phosphatase: 59 U/L (ref 38–126)
Anion gap: 4 — ABNORMAL LOW (ref 5–15)
BUN: 23 mg/dL — ABNORMAL HIGH (ref 6–20)
CO2: 30 mmol/L (ref 22–32)
Calcium: 8.7 mg/dL — ABNORMAL LOW (ref 8.9–10.3)
Chloride: 105 mmol/L (ref 101–111)
Creatinine, Ser: 1.35 mg/dL — ABNORMAL HIGH (ref 0.61–1.24)
GFR calc Af Amer: 55 mL/min — ABNORMAL LOW (ref >60)
GFR calc non Af Amer: 47 mL/min — ABNORMAL LOW (ref >60)
Glucose, Bld: 185 mg/dL — ABNORMAL HIGH (ref 65–99)
Potassium: 4.1 mmol/L (ref 3.5–5.1)
Sodium: 139 mmol/L (ref 135–145)
Total Bilirubin: 0.6 mg/dL (ref 0.3–1.2)
Total Protein: 5.4 g/dL — ABNORMAL LOW (ref 6.5–8.1)

## 2016-05-16 LAB — CBC WITH DIFFERENTIAL/PLATELET
Basophils Absolute: 0.1 10*3/uL (ref 0–0.1)
Basophils Relative: 1 %
Eosinophils Absolute: 0.1 10*3/uL (ref 0–0.7)
Eosinophils Relative: 2 %
HCT: 35.3 % — ABNORMAL LOW (ref 40.0–52.0)
Hemoglobin: 11.9 g/dL — ABNORMAL LOW (ref 13.0–18.0)
Lymphocytes Relative: 16 %
Lymphs Abs: 1.2 10*3/uL (ref 1.0–3.6)
MCH: 29.8 pg (ref 26.0–34.0)
MCHC: 33.6 g/dL (ref 32.0–36.0)
MCV: 88.6 fL (ref 80.0–100.0)
Monocytes Absolute: 0.7 10*3/uL (ref 0.2–1.0)
Monocytes Relative: 9 %
Neutro Abs: 5.1 10*3/uL (ref 1.4–6.5)
Neutrophils Relative %: 72 %
Platelets: 133 10*3/uL — ABNORMAL LOW (ref 150–440)
RBC: 3.99 MIL/uL — ABNORMAL LOW (ref 4.40–5.90)
RDW: 19.7 % — ABNORMAL HIGH (ref 11.5–14.5)
WBC: 7.1 10*3/uL (ref 3.8–10.6)

## 2016-05-16 LAB — LACTATE DEHYDROGENASE: LDH: 208 U/L — ABNORMAL HIGH (ref 98–192)

## 2016-05-16 LAB — URIC ACID: Uric Acid, Serum: 4.9 mg/dL (ref 4.4–7.6)

## 2016-05-16 MED ORDER — ACETAMINOPHEN 325 MG PO TABS
650.0000 mg | ORAL_TABLET | Freq: Once | ORAL | Status: AC
  Administered 2016-05-16: 650 mg via ORAL
  Filled 2016-05-16: qty 2

## 2016-05-16 MED ORDER — SODIUM CHLORIDE 0.9% FLUSH
10.0000 mL | INTRAVENOUS | Status: DC | PRN
  Filled 2016-05-16: qty 10

## 2016-05-16 MED ORDER — SODIUM CHLORIDE 0.9 % IV SOLN
375.0000 mg/m2 | Freq: Once | INTRAVENOUS | Status: AC
  Administered 2016-05-16: 800 mg via INTRAVENOUS
  Filled 2016-05-16: qty 50

## 2016-05-16 MED ORDER — SODIUM CHLORIDE 0.9 % IV SOLN
400.0000 mg/m2 | Freq: Once | INTRAVENOUS | Status: AC
  Administered 2016-05-16: 860 mg via INTRAVENOUS
  Filled 2016-05-16: qty 43

## 2016-05-16 MED ORDER — SODIUM CHLORIDE 0.9 % IV SOLN
Freq: Once | INTRAVENOUS | Status: AC
  Administered 2016-05-16: 10:00:00 via INTRAVENOUS
  Filled 2016-05-16: qty 1000

## 2016-05-16 MED ORDER — HEPARIN SOD (PORK) LOCK FLUSH 100 UNIT/ML IV SOLN
500.0000 [IU] | Freq: Once | INTRAVENOUS | Status: AC | PRN
  Administered 2016-05-16: 500 [IU]
  Filled 2016-05-16: qty 5

## 2016-05-16 MED ORDER — DEXAMETHASONE SODIUM PHOSPHATE 10 MG/ML IJ SOLN
10.0000 mg | Freq: Once | INTRAMUSCULAR | Status: AC
  Administered 2016-05-16: 10 mg via INTRAVENOUS
  Filled 2016-05-16: qty 1

## 2016-05-16 MED ORDER — PEGFILGRASTIM 6 MG/0.6ML ~~LOC~~ PSKT
6.0000 mg | PREFILLED_SYRINGE | Freq: Once | SUBCUTANEOUS | Status: AC
  Administered 2016-05-16: 6 mg via SUBCUTANEOUS
  Filled 2016-05-16: qty 0.6

## 2016-05-16 MED ORDER — DOXORUBICIN HCL CHEMO IV INJECTION 2 MG/ML
25.0000 mg/m2 | Freq: Once | INTRAVENOUS | Status: AC
  Administered 2016-05-16: 54 mg via INTRAVENOUS
  Filled 2016-05-16: qty 27

## 2016-05-16 MED ORDER — PALONOSETRON HCL INJECTION 0.25 MG/5ML
0.2500 mg | Freq: Once | INTRAVENOUS | Status: AC
  Administered 2016-05-16: 0.25 mg via INTRAVENOUS
  Filled 2016-05-16: qty 5

## 2016-05-16 MED ORDER — RITUXIMAB CHEMO INJECTION 500 MG/50ML: 375.0000 mg/m2 | Freq: Once | INTRAVENOUS | Status: DC

## 2016-05-16 MED ORDER — DIPHENHYDRAMINE HCL 25 MG PO CAPS
50.0000 mg | ORAL_CAPSULE | Freq: Once | ORAL | Status: AC
  Administered 2016-05-16: 50 mg via ORAL
  Filled 2016-05-16: qty 2

## 2016-05-16 NOTE — Progress Notes (Signed)
Patient offers no complaint today.

## 2016-05-16 NOTE — Progress Notes (Signed)
I have personally performed a a face to face evaluation of this patient and I agree with the history and physical and assessment as documented by NP Faythe Casa.   Tolerating chemo well. Cycle # 6 of mini RCHOP today. PET CT in 2 weeks and see Dr. Mike Gip for f/u  Dr. Randa Evens, MD, MPH [facility] Pager- [phone] 05/16/2016 1:02 PM

## 2016-05-16 NOTE — Progress Notes (Signed)
Hematology/Oncology Consult note [facility]  Telephone:([phone] [phone]  Patient Care Team: Kirk Ruths, MD as PCP - General (Internal Medicine)   Name of the patient: Hayden Martin  [id number]  1931/04/08   Date of visit: 05/16/16  Diagnosis- Stage IIIB DLBCL  Chief complaint/ Reason for visit- on treatment assessment prior to cycle # 6 of mini- RCHOP  Heme/Onc history: Hayden Martin is a 83 y.o. 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  He presented with a 30-35 pound weight loss in 2-3 months.  CT-guided retroperitoneal lymph node biopsyon 01/21/2016 revealed a CD30 positive large B-cell lymphoma. Immunohistochemical studies were positive for CD20, CD30, BCL-2, BCL 6, Ki-67 (>90%) and negative for CD10 and cyclin D1.  Bone marrow biopsyon 01/21/2016 revealed no evidence of lymphoma. Flow cytometry was negative. Cytogenetics were normal (46, XY).  Abdomen and pelvic CTscan on 01/07/2016 revealed multiple solid masses within the spleen. There was extensive retroperitoneal, portal and mesenteric lymphadenopathy. Retrocrural node measured 2.1 x 2.0 cm. Periportal node measured 2.6 x 5.8 cm. Confluent nodal mass is in the retroperitoneum surrounding the great vessels measured 5.6 x 3.8 cmon the left and 5.3 x 2.9 cm on the right. Left iliac node measured 1.7 x 2.6 cm. There was possible pleural disease on the left. The prostate was markedly enlarged. There was no disease in the liver.  PET scan on 01/16/2016 revealed extensive hypermetabolic adenopathy within the neck, chest, abdomen and pelvis. The spleen was enlarged with multi focal hypermetabolic splenic lesions There was evidence of transperitoneal spread of tumor within the upper abdomen. There were bilateral pleural effusions.  Hepatitis serologieswere negative on 01/16/2016. G6PD testingnegative. Echoon 01/18/2016 revealed an EF of 55-60%.     He is s/p 3 cycles of mini-RCHOP(01/23/2016 - 03/14/2016). Cycle #1 was complicated bytumor lysis syndrome, leukopenia, and symptomatic pleural effusions.  Diagnostic and 1 liter therapeutic left sided thoracentesis on 02/08/2016 revealed involvement with lymphoma.  He received OnPro Neulasta with cycle #2.  He has a history of left lower extremity thrombosis.  He has had 2 clots 1 year apart and is on life long anticoagulation.  Bilateral lower extremity duplex on 01/16/2016 revealed DVT in the left lower extremity involving the calf veins and the left popliteal vein.  IVC filterwas placed on 01/18/2016.  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  There was interval resolution of the left calf vein DVT.  He is on Xarelto.  He has chronic renal insufficiency(Cr 1.3- 1.4). Creatinine is 1.27 (CrCl 51 ml/min) today.  Chest CT angiogramon 02/02/2016 revealed no evidence of pulmonary emboli. There were bilateral pleural effusions(moderate size left and small to moderate right).     Interval history- He has a persistent right foot drop that is being followed by neurology. He is wearing a brace that is helping. He offers no other complaints.    ECOG PS- 1 Pain scale- 0 Opioid associated constipation- no  Review of systems- Review of Systems  Constitutional: Negative for chills, fever, malaise/fatigue and weight loss.  HENT: Negative for congestion, ear discharge and nosebleeds.   Eyes: Negative for blurred vision.  Respiratory: Negative for cough, hemoptysis, sputum production, shortness of breath and wheezing.   Cardiovascular: Negative for chest pain, palpitations, orthopnea and claudication.  Gastrointestinal: Negative for abdominal pain, blood in stool, constipation, diarrhea, heartburn, melena, nausea and vomiting.  Genitourinary: Negative  for dysuria, flank pain,  frequency, hematuria and urgency.  Musculoskeletal: Negative for back pain, joint pain and myalgias.  Skin: Negative for rash.  Neurological: Negative for dizziness, tingling, focal weakness, seizures, weakness and headaches.  Endo/Heme/Allergies: Does not bruise/bleed easily.  Psychiatric/Behavioral: Negative for depression and suicidal ideas. The patient does not have insomnia.      Current treatment- mini RCHOP  Allergies  Allergen Reactions  . Codeine Itching  . Penicillins Itching     Past Medical History:  Diagnosis Date  . Cancer ([place])   . Clotting disorder ([place])   . Diabetes mellitus without complication ([place])   . Heart disease   . Hypertension      Past Surgical History:  Procedure Laterality Date  . HERNIA REPAIR    . PERIPHERAL VASCULAR CATHETERIZATION N/A 01/18/2016   Procedure: IVC Filter Insertion;  Surgeon: Katha Cabal, MD;  Location: [facility];  Service: Cardiovascular;  Laterality: N/A;  . PERIPHERAL VASCULAR CATHETERIZATION N/A 01/21/2016   Procedure: Glori Luis Cath Insertion;  Surgeon: Algernon Huxley, MD;  Location: [facility];  Service: Cardiovascular;  Laterality: N/A;    Social History   Social History  . Marital status: Widowed    Spouse name: N/A  . Number of children: N/A  . Years of education: N/A   Occupational History  . Not on file.   Social History Main Topics  . Smoking status: Former Smoker    Packs/day: 1.50    Quit date: 01/01/1986  . Smokeless tobacco: Former Systems developer     Comment: Smoked for 20 years  . Alcohol use Not on file  . Drug use: Unknown  . Sexual activity: Not on file   Other Topics Concern  . Not on file   Social History Narrative  . No narrative on file    No family history on file.   Current Outpatient Prescriptions:  .  allopurinol (ZYLOPRIM) 300 MG tablet, TAKE ONE (1) TABLET BY MOUTH EVERY DAY, Disp: 30 tablet, Rfl: 1 .  aspirin EC 81 MG tablet, Take 81 mg by mouth daily., Disp: ,  Rfl:  .  atorvastatin (LIPITOR) 40 MG tablet, Take 20 mg by mouth daily., Disp: , Rfl:  .  carvedilol (COREG) 6.25 MG tablet, TAKE ONE (1) TABLET BY MOUTH TWO (2) TIMES DAILY, Disp: , Rfl:  .  Cholecalciferol (VITAMIN D3) 10000 units TABS, Take by mouth., Disp: , Rfl:  .  furosemide (LASIX) 20 MG tablet, Take 20 mg by mouth., Disp: , Rfl:  .  Insulin Glargine (LANTUS [place]), Inject into the skin. Inject 35 units subcutaneously at bedtime., Disp: , Rfl:  .  Insulin NPH Isophane & Regular (NOVOLIN 70/30 [place]), Inject 8 Units into the skin 3 (three) times daily., Disp: , Rfl:  .  ketoconazole (NIZORAL) 2 % cream, APPLY AT BEDTIME DAILY TO AFFECTED AREAS, Disp: , Rfl:  .  lidocaine-prilocaine (EMLA) cream, Apply 1 application topically as needed., Disp: 30 g, Rfl: 3 .  ondansetron (ZOFRAN) 8 MG tablet, Take 1 tablet (8 mg total) by mouth 2 (two) times daily as needed for nausea or vomiting., Disp: 30 tablet, Rfl: 0 .  rivaroxaban (XARELTO) 20 MG TABS tablet, Take 20 mg by mouth daily with supper., Disp: , Rfl:  .  Vitamin D, Ergocalciferol, (DRISDOL) 50000 units CAPS capsule, Take by mouth., Disp: , Rfl:  .  albuterol (PROVENTIL HFA) 108 (90 Base) MCG/ACT inhaler, Inhale 2 puffs into the lungs every 4 (four) hours  as needed for wheezing or shortness of breath. (Patient not taking: Reported on 03/14/2016), Disp: 1 Inhaler, Rfl: 0 .  dronabinol (MARINOL) 2.5 MG capsule, Take 1 capsule (2.5 mg total) by mouth 2 (two) times daily before lunch and supper. (Patient not taking: Reported on 03/14/2016), Disp: 60 capsule, Rfl: 0 .  predniSONE (DELTASONE) 20 MG tablet, Take 80 mg by mouth daily with breakfast. Take for 5 days starting day after chemo, Disp: , Rfl:  No current facility-administered medications for this visit.   Facility-Administered Medications Ordered in Other Visits:  .  heparin lock flush 100 unit/mL, 500 Units, Intracatheter, Once PRN, Lequita Asal, MD .  sodium chloride flush (NS) 0.9 %  injection 10 mL, 10 mL, Intravenous, PRN, Cammie Sickle, MD, 10 mL at 03/10/16 0949 .  sodium chloride flush (NS) 0.9 % injection 10 mL, 10 mL, Intracatheter, PRN, Lequita Asal, MD  Physical exam:  Vitals:   05/16/16 0923  BP: 133/65  Pulse: 71  Resp: 18  Temp: 97.9 F (36.6 C)  TempSrc: Tympanic  Weight: 203 lb 7.8 oz (92.3 kg)   Physical Exam  Constitutional: He is oriented to person, place, and time and well-developed, well-nourished, and in no distress.  HENT:  Head: Normocephalic and atraumatic.  Eyes: EOM are normal. Pupils are equal, round, and reactive to light.  Neck: Normal range of motion.  Cardiovascular: Normal rate and regular rhythm.   Murmur (systolic murmur +) heard. Pulmonary/Chest: Effort normal and breath sounds normal.  Abdominal: Soft. Bowel sounds are normal.  Neurological: He is alert and oriented to person, place, and time.  Skin: Skin is warm and dry.     CMP Latest Ref Rng & Units 05/16/2016  Glucose 65 - 99 mg/dL 185(H)  BUN 6 - 20 mg/dL 23(H)  Creatinine 0.61 - 1.24 mg/dL 1.35(H)  Sodium 135 - 145 mmol/L 139  Potassium 3.5 - 5.1 mmol/L 4.1  Chloride 101 - 111 mmol/L 105  CO2 22 - 32 mmol/L 30  Calcium 8.9 - 10.3 mg/dL 8.7(L)  Total Protein 6.5 - 8.1 g/dL 5.4(L)  Total Bilirubin 0.3 - 1.2 mg/dL 0.6  Alkaline Phos 38 - 126 U/L 59  AST 15 - 41 U/L 27  ALT 17 - 63 U/L 36   CBC Latest Ref Rng & Units 05/16/2016  WBC 3.8 - 10.6 K/uL 7.1  Hemoglobin 13.0 - 18.0 g/dL 11.9(L)  Hematocrit 40.0 - 52.0 % 35.3(L)  Platelets 150 - 440 K/uL 133(L)    No images are attached to the encounter.  Mr Lumbar Spine Wo Contrast  Result Date: 04/18/2016 CLINICAL DATA:  Right foot drop.  Lymphoma. EXAM: MRI LUMBAR SPINE WITHOUT CONTRAST TECHNIQUE: Multiplanar, multisequence MR imaging of the lumbar spine was performed. No intravenous contrast was administered. COMPARISON:  None. FINDINGS: Segmentation:  Normal Alignment:  Normal Vertebrae: No acute  compression fracture, discitis-osteomyelitis, facet edema or other focal marrow lesion. No epidural collection. Conus medullaris: Extends to the L1 level and appears normal. Paraspinal and other soft tissues: Right renal sinus cysts are present. Otherwise unremarkable. Disc levels: T12-L1: Normal disc space and facets. No spinal canal or neuroforaminal stenosis. L1-L2: Normal disc space and facets. No spinal canal or neuroforaminal stenosis. L2-L3: Disc desiccation with minimal bulge and mild bilateral facet hypertrophy. No spinal canal or neural foraminal stenosis. L3-L4: Diffuse medium-sized disc bulge with superimposed central extrusion with minimal inferior migration. Bilateral moderate facet hypertrophy. No spinal canal stenosis. No neural foraminal stenosis. L4-L5: Disc desiccation with  medium-sized disc bulge and small superimposed central protrusion. Severe bilateral facet hypertrophy. Mild spinal canal stenosis. No neural foraminal stenosis. L5-S1: Small disc bulge and mild facet hypertrophy. No spinal canal stenosis. Mild right neural foraminal stenosis. IMPRESSION: 1. Mild spinal canal stenosis at L4-L5 due to combination of disc disease and facet hypertrophy. 2. Mild right L5-S1 neural foraminal narrowing. 3. Multilevel facet arthrosis, which may be a source back pain. Electronically Signed   By: Ulyses Jarred M.D.   On: 04/18/2016 15:16   Nm Pet Image Restag (ps) Skull Base To Thigh  Result Date: 04/23/2016 CLINICAL DATA:  Subsequent treatment strategy for diffuse B cell lymphoma. Status post 4 cycles chemotherapy. EXAM: NUCLEAR MEDICINE PET SKULL BASE TO THIGH TECHNIQUE: 12.5 mCi F-18 FDG was injected intravenously. Full-ring PET imaging was performed from the skull base to thigh after the radiotracer. CT data was obtained and used for attenuation correction and anatomic localization. FASTING BLOOD GLUCOSE:  Value: 133 mg/dl COMPARISON:  PET-CT 01/16/2016 FINDINGS: NECK No hypermetabolic lymph  nodes in the neck. Right-sided Port-A-Cath noted. CHEST No hypermetabolic mediastinal or hilar nodes. No suspicious pulmonary nodules on the CT scan. Moderate size left pleural effusion with overlying atelectasis. Small right pleural effusion with basilar atelectasis. New line stable atherosclerotic calcifications involving the aorta and coronary arteries. ABDOMEN/PELVIS No abnormal hypermetabolic activity within the liver, pancreas, adrenal glands, or spleen. No hypermetabolic lymph nodes in the abdomen or pelvis. Minimal residual matted soft tissue density in the mesenteric and retroperitoneum consistent with treated lymphoma. No residual metabolically active tumor is identified. The large masses in the spleen have completely resolved. The pelvic lymphadenopathy has resolved. SKELETON No focal hypermetabolic activity to suggest skeletal metastasis. IMPRESSION: Excellent response to treatment. The extensive adenopathy seen on the prior PET-CT shows remarkable improvement. No residual metabolically active lymphoma is identified. Moderate left pleural effusion and small right pleural effusion. Electronically Signed   By: Marijo Sanes M.D.   On: 04/23/2016 14:20     Assessment and plan- Patient is a 83 y.o. male with a history of stage IIIB diffuse large B-cell lymphoma currently receiving mini R CHOP  1. Counts okay to proceed with cycle #6 of mini R CHOP today. Echo on 3/12 showed EF 55-60%.  2. PET/CT scan after 4 cycles of chemotherapy showed excellent response to treatment. Remarkable improvement in extensive adenopathy and no residual metabolically active lymphoma was identified. Repeat PET scan in 2-3 weeks.  3. Continue to hold vincristine given right foot drop. Possible cause of foot drop per neurology.  4. Patient will see Dr. Mike Gip in 3 weeks' time with results from PET scan and labs (CBC, LDH and CMP).    Visit Diagnosis 1. Diffuse large B-cell lymphoma of lymph nodes of multiple regions  ([place])   2. Encounter for antineoplastic chemotherapy    Faythe Casa, NP

## 2016-05-30 ENCOUNTER — Ambulatory Visit: Payer: Medicare Other

## 2016-06-05 ENCOUNTER — Other Ambulatory Visit: Payer: Self-pay | Admitting: *Deleted

## 2016-06-05 DIAGNOSIS — C8330 Diffuse large B-cell lymphoma, unspecified site: Secondary | ICD-10-CM

## 2016-06-06 ENCOUNTER — Ambulatory Visit
Admission: RE | Admit: 2016-06-06 | Discharge: 2016-06-06 | Disposition: A | Discharge status: Home / Self Care | Payer: Medicare Other | Source: Ambulatory Visit | Attending: Oncology | Admitting: Oncology

## 2016-06-06 DIAGNOSIS — K429 Umbilical hernia without obstruction or gangrene: Secondary | ICD-10-CM | POA: Insufficient documentation

## 2016-06-06 DIAGNOSIS — I517 Cardiomegaly: Secondary | ICD-10-CM | POA: Diagnosis not present

## 2016-06-06 DIAGNOSIS — C8338 Diffuse large B-cell lymphoma, lymph nodes of multiple sites: Secondary | ICD-10-CM | POA: Diagnosis present

## 2016-06-06 DIAGNOSIS — N400 Enlarged prostate without lower urinary tract symptoms: Secondary | ICD-10-CM | POA: Diagnosis not present

## 2016-06-06 DIAGNOSIS — I2510 Atherosclerotic heart disease of native coronary artery without angina pectoris: Secondary | ICD-10-CM | POA: Diagnosis not present

## 2016-06-06 DIAGNOSIS — I6523 Occlusion and stenosis of bilateral carotid arteries: Secondary | ICD-10-CM | POA: Diagnosis not present

## 2016-06-06 DIAGNOSIS — I708 Atherosclerosis of other arteries: Secondary | ICD-10-CM | POA: Insufficient documentation

## 2016-06-06 DIAGNOSIS — J90 Pleural effusion, not elsewhere classified: Secondary | ICD-10-CM | POA: Insufficient documentation

## 2016-06-06 DIAGNOSIS — I700 Atherosclerosis of aorta: Secondary | ICD-10-CM | POA: Diagnosis not present

## 2016-06-06 LAB — GLUCOSE, CAPILLARY: GLUCOSE-CAPILLARY: 122 mg/dL — AB (ref 65–99)

## 2016-06-06 MED ORDER — FLUDEOXYGLUCOSE F - 18 (FDG) INJECTION
13.1600 | Freq: Once | INTRAVENOUS | Status: AC | PRN
  Administered 2016-06-06: 13.16 via INTRAVENOUS

## 2016-06-06 MED ORDER — FLUDEOXYGLUCOSE F - 18 (FDG) INJECTION: 12.5000 | Freq: Once | INTRAVENOUS | Status: DC | PRN

## 2016-06-09 ENCOUNTER — Inpatient Hospital Stay: Payer: Medicare Other | Admitting: Hematology and Oncology

## 2016-06-09 NOTE — Progress Notes (Unsigned)
[facility] day:  06/09/2016   Chief Complaint: Hayden Martin is a 83 y.o. 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  HPI:  The patient was last seen by me in the medical oncology clinic on 04/25/2016.  At that time, he felt great.  He denied any B symptoms.  He had persistent right foot drop.  He received cycle #4 mini-RCHOP without vincristine.  He saw Dr. Janese Banks in my absence on 05/16/2016.  He received cycle #6 mini-RCHOP without vincristine.  PET scan on 06/06/2016 revealed continued improvement and near resolution. In 01/2016, he had fairly extensive left neck, mediastinal, mesenteric, and retroperitoneal adenopathy which improved dramatically by 04/23/2016.  On 06/06/2016 exam there was only minimal retroperitoneal nodularity/adenopathy, much of which likely represents a residuum from previously treated lymphoma.  The residual periaortic lymph node measured 1.2 cm in short axis with maximum SUV 2.2 (formerly 1.4 cm and 3.6, respectively). Accordingly this represents continued improvement, the minimal residual nodal activity was Deauville 3.   Past Medical History:  Diagnosis Date  . Cancer ([place])   . Clotting disorder ([place])   . Diabetes mellitus without complication ([place])   . Heart disease   . Hypertension     Past Surgical History:  Procedure Laterality Date  . HERNIA REPAIR    . PERIPHERAL VASCULAR CATHETERIZATION N/A 01/18/2016   Procedure: IVC Filter Insertion;  Surgeon: Katha Cabal, MD;  Location: [facility];  Service: Cardiovascular;  Laterality: N/A;  . PERIPHERAL VASCULAR CATHETERIZATION N/A 01/21/2016   Procedure: Glori Luis Cath Insertion;  Surgeon: Algernon Huxley, MD;  Location: [facility];  Service: Cardiovascular;  Laterality: N/A;    No family history on file.  Social History:  reports that he quit smoking about 30 years ago. He smoked  1.50 packs per day. He has quit using smokeless tobacco. His alcohol and drug histories are not on file.  He was in the [employer].  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  He was discharged from [facility] on 02/20/2016.  He has 3 children who live locally.  The patient is accompanied by his son, Hayden Martin, today.  Allergies:  Allergies  Allergen Reactions  . Codeine Itching  . Penicillins Itching    Current Medications: Current Outpatient Prescriptions  Medication Sig Dispense Refill  . albuterol (PROVENTIL HFA) 108 (90 Base) MCG/ACT inhaler Inhale 2 puffs into the lungs every 4 (four) hours as needed for wheezing or shortness of breath. (Patient not taking: Reported on 03/14/2016) 1 Inhaler 0  . allopurinol (ZYLOPRIM) 300 MG tablet TAKE ONE (1) TABLET BY MOUTH EVERY DAY 30 tablet 1  . aspirin EC 81 MG tablet Take 81 mg by mouth daily.    [name] atorvastatin (LIPITOR) 40 MG tablet Take 20 mg by mouth daily.    . carvedilol (COREG) 6.25 MG tablet TAKE ONE (1) TABLET BY MOUTH TWO (2) TIMES DAILY    . Cholecalciferol (VITAMIN D3) 10000 units TABS Take by mouth.    . dronabinol (MARINOL) 2.5 MG capsule Take 1 capsule (2.5 mg total) by mouth 2 (two) times daily before lunch and supper. (Patient not taking: Reported on 03/14/2016) 60 capsule 0  . furosemide (LASIX) 20 MG tablet Take 20 mg by mouth.    . Insulin Glargine (LANTUS [place]) Inject into the skin. Inject 35 units subcutaneously at  bedtime.    . Insulin NPH Isophane & Regular (NOVOLIN 70/30 [place]) Inject 8 Units into the skin 3 (three) times daily.    [name] ketoconazole (NIZORAL) 2 % cream APPLY AT BEDTIME DAILY TO AFFECTED AREAS    . lidocaine-prilocaine (EMLA) cream Apply 1 application topically as needed. 30 g 3  . ondansetron (ZOFRAN) 8 MG tablet Take 1 tablet (8 mg total) by mouth 2 (two) times daily as needed for nausea or vomiting. 30 tablet 0  . predniSONE (DELTASONE) 20 MG tablet Take 80 mg by mouth daily with breakfast.  Take for 5 days starting day after chemo    . rivaroxaban (XARELTO) 20 MG TABS tablet Take 20 mg by mouth daily with supper.     No current facility-administered medications for this visit.    Facility-Administered Medications Ordered in Other Visits  Medication Dose Route Frequency Provider Last Rate Last Dose  . fludeoxyglucose F - 18 (FDG) injection 86.7 millicurie  61.9 millicurie Intravenous Once PRN Hetal Marlowe Sax, MD      . sodium chloride flush (NS) 0.9 % injection 10 mL  10 mL Intravenous PRN Cammie Sickle, MD   10 mL at 03/10/16 0949    Review of Systems:  GENERAL:  Feels good.  No fevers or sweats.  Weight gain of 7 pounds. PERFORMANCE STATUS (ECOG):  1 HEENT:  No visual changes, runny nose, sore throat, mouth sores or tenderness. Lungs: No shortness of breath or cough.  No hemoptysis. Cardiac:  No chest pain, palpitations, orthopnea, or PND. GI:  Appetite good.  No vomiting, diarrhea, constipation, or hematochezia. GU:  No urgency, frequency, dysuria, or hematuria. Musculoskeletal:  Back issues.  No muscle tenderness. Extremities:  No pain or swelling. Skin:  No rashes or skin changes. Neuro:  Right foot drop.  No headache, numbness or weakness, or coordination issues.  Balance issues. Endocrine:  Diabetes.  Blood sugar good.  No thyroid issues, hot flashes or night sweats. Psych:  No mood changes, depression or anxiety. Pain:  No focal pain. Review of systems:  All other systems reviewed and found to be negative.  Physical Exam: There were no vitals taken for this visit. GENERAL:  Elderly gentleman sitting comfortably in the exam room in no acute distress.  MENTAL STATUS:  Alert and oriented to person, place and time. HEAD:  Wearing an Scientist, clinical (histocompatibility and immunogenetics).  Scant gray hair.  Male pattern baldness.  Normocephalic, atraumatic, face symmetric, no Cushingoid features. EYES: Blue eyes.  Pupils equal round and reactive to light and accomodation.  No conjunctivitis or  scleral icterus. ENT:  Hearing aide.  Oropharynx clear without lesion.  Tongue normal.  Mucous membranes moist.  RESPIRATORY:  Clear to auscultation without rales, wheezes or rhonchi. CARDIOVASCULAR:  Regular rate and rhythm without murmur, rub or gallop. ABDOMEN:  Soft, non-tender, with active bowel sounds, and no appreciable hepatosplenomegaly.  No masses. SKIN:  No rashes, ulcers or lesions. EXTREMITIES:  Bilateral sock-height chronic lower extremity edema (left > right).  No skin discoloration or tenderness.  No palpable cords. LYMPH NODES: No palpable cervical, supraclavicular, axillary or inguinal adenopathy  NEUROLOGICAL:  Right sided foot drop.  Otherwise, lower extremity strength and sensation intact. PSYCH:  Appropriate.   No visits with results within 3 Day(s) from this visit.  Latest known visit with results is:  Hospital Outpatient Visit on 06/06/2016  Component Date Value Ref Range Status  . Glucose-Capillary 06/06/2016 122* 65 - 99 mg/dL Final    Assessment:  DETAVIOUS RINN is a 83 y.o. 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  He presented with a 30-35 pound weight loss in 2-3 months.  CT-guided retroperitoneal lymph node biopsy on 01/21/2016 revealed a CD30 positive large B-cell lymphoma. Immunohistochemical studies were positive for CD20, CD30, BCL-2, BCL 6, Ki-67 (> 90%) and negative for CD10 and cyclin D1.  Bone marrow biopsy on 01/21/2016 revealed no evidence of lymphoma.  Flow cytometry was negative.  Cytogenetics were normal (46, XY).  Abdomen and pelvic CT scan on 01/07/2016 revealed multiple solid masses within the spleen.  There was extensive retroperitoneal, portal and mesenteric lymphadenopathy.  Retrocrural node measured 2.1 x 2.0 cm.  Periportal node measured 2.6 x 5.8 cm. Confluent nodal mass is in the retroperitoneum surrounding the great vessels measured 5.6 x 3.8 cmon the left and 5.3 x 2.9 cm on the right.  Left iliac node measured 1.7 x 2.6  cm.  There was possible pleural disease on the left.  The prostate was markedly enlarged.  There was no disease in the liver.  PET scan on 01/16/2016 revealed extensive hypermetabolic adenopathy within the neck, chest, abdomen and pelvis. The spleen was enlarged with multi focal hypermetabolic splenic lesions There was evidence of transperitoneal spread of tumor within the upper abdomen. There were bilateral pleural effusions.  Hepatitis serologies were negative on 01/16/2016. G6PD testing negative.  Echo on 01/18/2016 revealed an EF of 55-60%.   He is s/p 6 cycles of mini-RCHOP (01/23/2016 - 05/16/2016).  Cycle #1 was complicated by tumor lysis syndrome, leukopenia, and symptomatic pleural effusions.  Diagnostic and 1 liter therapeutic left sided thoracentesis on 02/08/2016 revealed involvement with lymphoma.  He received OnPro Neulasta with cycle #2 - #6.  PET scan on 04/23/2016 revealed an excellent response to treatment. The extensive adenopathy seen on the prior PET-CT showed remarkable improvement.  There was no residual metabolically active lymphoma is identified. There was a moderate left pleural effusion and small right pleural effusion.  PET scan on 06/06/2016 revealed continued improvement and near resolution. There was only minimal retroperitoneal nodularity/adenopathy, much of which likely represents a residuum from previously treated lymphoma.  The residual periaortic lymph node measured 1.2 cm in short axis with maximum SUV 2.2 (formerly 1.4 cm and 3.6, respectively). The minimal residual nodal activity was Deauville 3.  He has a history of left lower extremity thrombosis.  He has had 2 clots 1 year apart and is on life long anticoagulation.  Bilateral lower extremity duplex on 01/16/2016 revealed DVT in the left lower extremity involving the calf veins and the left popliteal vein.  IVC filterwas placed on 01/18/2016.  Bilateral lower extremity duplex on 02/05/2016 revealed  partially recanalized subacute to chronic DVT in the left popliteal vein with decreased thrombus burden.  There was interval resolution of the left calf vein DVT.  He is on Xarelto.  He has chronic renal insufficiency (Cr 1.3- 1.4).  Creatinine is 1.27 (CrCl 51 ml/min) today.  Chest CT angiogram on 02/02/2016 revealed no evidence of pulmonary emboli. There were bilateral pleural effusions (moderate size left and small to moderate right).  Symptomatically, he has a persistent right foot drop.  Plan: 1.  Labs today:  CBC with diff, CMP, LDH, uric acid. 2.  Review PET scan. 3.  Discuss neurology follow-up and foot drop. 4.  Discuss history of DVT and IVC filter.    6.  Rx:  Allopurinol 300 mg a day. 8.  Continue Xarelto.  Lequita Asal, MD  06/09/2016, 5:01 AM

## 2016-06-10 ENCOUNTER — Encounter: Payer: Self-pay | Admitting: Hematology and Oncology

## 2016-06-10 ENCOUNTER — Inpatient Hospital Stay: Payer: Medicare Other | Attending: Hematology and Oncology | Admitting: Hematology and Oncology

## 2016-06-10 VITALS — BP 155/66 | HR 71 | Temp 97.4°F | Resp 18 | Wt 198.3 lb

## 2016-06-10 DIAGNOSIS — N189 Chronic kidney disease, unspecified: Secondary | ICD-10-CM | POA: Diagnosis not present

## 2016-06-10 DIAGNOSIS — Z7952 Long term (current) use of systemic steroids: Secondary | ICD-10-CM | POA: Diagnosis not present

## 2016-06-10 DIAGNOSIS — M21371 Foot drop, right foot: Secondary | ICD-10-CM | POA: Diagnosis not present

## 2016-06-10 DIAGNOSIS — I129 Hypertensive chronic kidney disease with stage 1 through stage 4 chronic kidney disease, or unspecified chronic kidney disease: Secondary | ICD-10-CM

## 2016-06-10 DIAGNOSIS — R634 Abnormal weight loss: Secondary | ICD-10-CM | POA: Diagnosis not present

## 2016-06-10 DIAGNOSIS — Z7982 Long term (current) use of aspirin: Secondary | ICD-10-CM | POA: Diagnosis not present

## 2016-06-10 DIAGNOSIS — Z7901 Long term (current) use of anticoagulants: Secondary | ICD-10-CM | POA: Diagnosis not present

## 2016-06-10 DIAGNOSIS — Z86718 Personal history of other venous thrombosis and embolism: Secondary | ICD-10-CM | POA: Diagnosis not present

## 2016-06-10 DIAGNOSIS — Z9221 Personal history of antineoplastic chemotherapy: Secondary | ICD-10-CM | POA: Diagnosis not present

## 2016-06-10 DIAGNOSIS — Z79899 Other long term (current) drug therapy: Secondary | ICD-10-CM | POA: Insufficient documentation

## 2016-06-10 DIAGNOSIS — C8338 Diffuse large B-cell lymphoma, lymph nodes of multiple sites: Secondary | ICD-10-CM | POA: Diagnosis not present

## 2016-06-10 DIAGNOSIS — I82432 Acute embolism and thrombosis of left popliteal vein: Secondary | ICD-10-CM

## 2016-06-10 DIAGNOSIS — Z87891 Personal history of nicotine dependence: Secondary | ICD-10-CM | POA: Insufficient documentation

## 2016-06-10 DIAGNOSIS — N400 Enlarged prostate without lower urinary tract symptoms: Secondary | ICD-10-CM

## 2016-06-10 DIAGNOSIS — Z794 Long term (current) use of insulin: Secondary | ICD-10-CM | POA: Insufficient documentation

## 2016-06-10 DIAGNOSIS — E1122 Type 2 diabetes mellitus with diabetic chronic kidney disease: Secondary | ICD-10-CM | POA: Insufficient documentation

## 2016-06-10 NOTE — Progress Notes (Signed)
[facility] day:  06/10/2016   Chief Complaint: Hayden Martin is a 83 y.o. 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  HPI:  The patient was last seen by me in the medical oncology clinic on 04/25/2016.  At that time, he felt great.  He denied any B symptoms.  He had persistent right foot drop.  He received cycle #5 mini-RCHOP without vincristine.  He saw Dr. Janese Banks in my absence on 05/16/2016.  He received cycle #6 mini-RCHOP without vincristine.  PET scan on 06/06/2016 revealed continued improvement and near resolution. In 01/2016, he had fairly extensive left neck, mediastinal, mesenteric, and retroperitoneal adenopathy which improved dramatically by 04/23/2016.  On 06/06/2016 exam there was only minimal retroperitoneal nodularity/adenopathy, much of which likely represents a residuum from previously treated lymphoma.  The residual periaortic lymph node measured 1.2 cm in short axis with maximum SUV 2.2 (formerly 1.4 cm and 3.6, respectively). Accordingly this represents continued improvement, the minimal residual nodal activity was Deauville 3.  Symptomatically, he feels good.  He continues to wear a a brace for his right foot drop.  He has a follow-up with Dr. Manuella Ghazi next month.  He recently purchased ICDs.  He denies any fevers, sweats or weight loss.  He denies any bruising or bleeding.   Past Medical History:  Diagnosis Date  . Cancer ([place])   . Clotting disorder ([place])   . Diabetes mellitus without complication ([place])   . Heart disease   . Hypertension     Past Surgical History:  Procedure Laterality Date  . HERNIA REPAIR    . PERIPHERAL VASCULAR CATHETERIZATION N/A 01/18/2016   Procedure: IVC Filter Insertion;  Surgeon: Katha Cabal, MD;  Location: [facility];  Service: Cardiovascular;  Laterality: N/A;  . PERIPHERAL VASCULAR  CATHETERIZATION N/A 01/21/2016   Procedure: Glori Luis Cath Insertion;  Surgeon: Algernon Huxley, MD;  Location: [facility];  Service: Cardiovascular;  Laterality: N/A;    History reviewed. No pertinent family history.  Social History:  reports that he quit smoking about 30 years ago. He smoked 1.50 packs per day. He has quit using smokeless tobacco. His alcohol and drug histories are not on file.  He was in the [employer].  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  He was discharged from [facility] on 02/20/2016.  He has 3 children who live locally.  He recently bought a new car.  The patient is alone today.  Allergies:  Allergies  Allergen Reactions  . Codeine Itching  . Penicillins Itching    Current Medications: Current Outpatient Prescriptions  Medication Sig Dispense Refill  . allopurinol (ZYLOPRIM) 300 MG tablet TAKE ONE (1) TABLET BY MOUTH EVERY DAY 30 tablet 1  . aspirin EC 81 MG tablet Take 81 mg by mouth daily.    [name] atorvastatin (LIPITOR) 40 MG tablet Take 20 mg by mouth daily.    . carvedilol (COREG) 6.25 MG tablet TAKE ONE (1) TABLET BY MOUTH TWO (2) TIMES DAILY    . Cholecalciferol (VITAMIN D3) 10000 units TABS Take by mouth.    . furosemide (LASIX) 20 MG tablet Take 20 mg by mouth.    . Insulin Glargine (LANTUS [place]) Inject into the skin. Inject 35 units subcutaneously at bedtime.    . Insulin NPH Isophane & Regular (  NOVOLIN 70/30 [place]) Inject 8 Units into the skin 3 (three) times daily.    [name] ketoconazole (NIZORAL) 2 % cream APPLY AT BEDTIME DAILY TO AFFECTED AREAS    . lidocaine-prilocaine (EMLA) cream Apply 1 application topically as needed. 30 g 3  . rivaroxaban (XARELTO) 20 MG TABS tablet Take 20 mg by mouth daily with supper.    [name] albuterol (PROVENTIL HFA) 108 (90 Base) MCG/ACT inhaler Inhale 2 puffs into the lungs every 4 (four) hours as needed for wheezing or shortness of breath. (Patient not taking: Reported on 03/14/2016) 1 Inhaler 0  .  dronabinol (MARINOL) 2.5 MG capsule Take 1 capsule (2.5 mg total) by mouth 2 (two) times daily before lunch and supper. (Patient not taking: Reported on 03/14/2016) 60 capsule 0  . ondansetron (ZOFRAN) 8 MG tablet Take 1 tablet (8 mg total) by mouth 2 (two) times daily as needed for nausea or vomiting. (Patient not taking: Reported on 06/10/2016) 30 tablet 0  . predniSONE (DELTASONE) 20 MG tablet Take 80 mg by mouth daily with breakfast. Take for 5 days starting day after chemo     No current facility-administered medications for this visit.    Facility-Administered Medications Ordered in Other Visits  Medication Dose Route Frequency Provider Last Rate Last Dose  . fludeoxyglucose F - 18 (FDG) injection 38.7 millicurie  56.4 millicurie Intravenous Once PRN Hetal Marlowe Sax, MD      . sodium chloride flush (NS) 0.9 % injection 10 mL  10 mL Intravenous PRN Cammie Sickle, MD   10 mL at 03/10/16 0949    Review of Systems:  GENERAL:  Feels good.  No fevers or sweats.  Weight down 5 pounds. PERFORMANCE STATUS (ECOG):  1 HEENT:  No visual changes, runny nose, sore throat, mouth sores or tenderness. Lungs: No shortness of breath or cough.  No hemoptysis. Cardiac:  No chest pain, palpitations, orthopnea, or PND. GI:  Appetite good.  No vomiting, diarrhea, constipation, or hematochezia. GU:  No urgency, frequency, dysuria, or hematuria. Musculoskeletal:  Back issues.  No muscle tenderness. Extremities:  No pain or swelling. Skin:  No rashes or skin changes. Neuro:  Right foot drop with brace (AFO), slightly improved.  No headache, numbness or weakness, or coordination issues.  Endocrine:  Diabetes.  No thyroid issues, hot flashes or night sweats. Psych:  No mood changes, depression or anxiety. Pain:  No focal pain. Review of systems:  All other systems reviewed and found to be negative.  Physical Exam: Blood pressure (!) 155/66, pulse 71, temperature 97.4 F (36.3 C), temperature source  Tympanic, resp. rate 18, weight 198 lb 5 oz (90 kg). GENERAL:  Elderly gentleman sitting comfortably in the exam room in no acute distress.  MENTAL STATUS:  Alert and oriented to person, place and time. HEAD:  Wearing an Scientist, clinical (histocompatibility and immunogenetics).  Scant gray hair.  Male pattern baldness.  Normocephalic, atraumatic, face symmetric, no Cushingoid features. EYES: Blue eyes.  Pupils equal round and reactive to light and accomodation.  No conjunctivitis or scleral icterus. ENT:  Hearing aide.  Oropharynx clear without lesion.  Tongue normal.  Mucous membranes moist.  RESPIRATORY:  Clear to auscultation without rales, wheezes or rhonchi. CARDIOVASCULAR:  Regular rate and rhythm without murmur, rub or gallop. ABDOMEN:  Soft, non-tender, with active bowel sounds, and no appreciable hepatosplenomegaly.  No masses. SKIN:  No rashes, ulcers or lesions. EXTREMITIES:  Right lower extremity brace (AFO).  2+ left ankle edema.  No skin discoloration  or tenderness.  No palpable cords. LYMPH NODES: No palpable cervical, supraclavicular, axillary or inguinal adenopathy  NEUROLOGICAL:  Right sided foot drop.  Otherwise, lower extremity strength and sensation intact. PSYCH:  Appropriate.   No visits with results within 3 Day(s) from this visit.  Latest known visit with results is:  Hospital Outpatient Visit on 06/06/2016  Component Date Value Ref Range Status  . Glucose-Capillary 06/06/2016 122* 65 - 99 mg/dL Final    Assessment:  Hayden Martin is a 83 y.o. 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  He presented with a 30-35 pound weight loss in 2-3 months.  CT-guided retroperitoneal lymph node biopsy on 01/21/2016 revealed a CD30 positive large B-cell lymphoma. Immunohistochemical studies were positive for CD20, CD30, BCL-2, BCL 6, Ki-67 (> 90%) and negative for CD10 and cyclin D1.  Bone marrow biopsy on 01/21/2016 revealed no evidence of lymphoma.  Flow cytometry was negative.  Cytogenetics were  normal (46, XY).  Abdomen and pelvic CT scan on 01/07/2016 revealed multiple solid masses within the spleen.  There was extensive retroperitoneal, portal and mesenteric lymphadenopathy.  Retrocrural node measured 2.1 x 2.0 cm.  Periportal node measured 2.6 x 5.8 cm. Confluent nodal mass is in the retroperitoneum surrounding the great vessels measured 5.6 x 3.8 cmon the left and 5.3 x 2.9 cm on the right.  Left iliac node measured 1.7 x 2.6 cm.  There was possible pleural disease on the left.  The prostate was markedly enlarged.  There was no disease in the liver.  PET scan on 01/16/2016 revealed extensive hypermetabolic adenopathy within the neck, chest, abdomen and pelvis. The spleen was enlarged with multi focal hypermetabolic splenic lesions There was evidence of transperitoneal spread of tumor within the upper abdomen. There were bilateral pleural effusions.  Hepatitis serologies were negative on 01/16/2016. G6PD testing negative.  Echo on 01/18/2016 revealed an EF of 55-60%.   He received 6 cycles of mini-RCHOP (01/23/2016 - 05/16/2016).  Cycle #1 was complicated by tumor lysis syndrome, leukopenia, and symptomatic pleural effusions.  Diagnostic and 1 liter therapeutic left sided thoracentesis on 02/08/2016 revealed involvement with lymphoma.  He received OnPro Neulasta with cycle #2 - #6.  PET scan on 04/23/2016 revealed an excellent response to treatment. The extensive adenopathy seen on the prior PET-CT showed remarkable improvement.  There was no residual metabolically active lymphoma is identified. There was a moderate left pleural effusion and small right pleural effusion.  PET scan on 06/06/2016 revealed continued improvement and near resolution. There was only minimal retroperitoneal nodularity/adenopathy, much of which likely represents a residuum from previously treated lymphoma.  The residual periaortic lymph node measured 1.2 cm in short axis with maximum SUV 2.2 (formerly 1.4  cm and 3.6, respectively). The minimal residual nodal activity was Deauville 3.  He has a history of left lower extremity thrombosis.  He has had 2 clots 1 year apart and is on life long anticoagulation.  Bilateral lower extremity duplex on 01/16/2016 revealed DVT in the left lower extremity involving the calf veins and the left popliteal vein.  IVC filterwas placed on 01/18/2016.  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  There was interval resolution of the left calf vein DVT.  He is on Xarelto.  He has chronic renal insufficiency (Cr 1.3- 1.4).  Creatinine is 1.27 (CrCl 51 ml/min) today.  Chest CT angiogram on 02/02/2016 revealed no evidence of  pulmonary emboli. There were bilateral pleural effusions (moderate size left and small to moderate right).  Symptomatically, he feels good.  He denies any B symptoms.  He has a persistent right foot drop.  Exam reveals no adenopathy or hepatosplenomegaly.  Plan: 1.  Review labs from 05/16/2016. 2.  Review PET scan. 3.  Discuss neurology follow-up and foot drop.  He can lift his toes slightly (improved).  Continue AFO. 4.  Discuss history of DVT and IVC filter.  Follow-up with Dr. Delana Meyer or Dr. Lucky Cowboy. 5.  Continue Xarelto.   6.  Port flush every 6 weeks 7.  RTC in 3 months for MD assessment, labs (CBC with diff, CMP, LDH, uric acid).   Lequita Asal, MD  06/10/2016, 11:55 AM

## 2016-06-10 NOTE — Progress Notes (Signed)
Patient here today for PET results.  Offers no complaints today. 

## 2016-07-02 ENCOUNTER — Other Ambulatory Visit: Payer: Self-pay | Admitting: Hematology and Oncology

## 2016-07-22 ENCOUNTER — Inpatient Hospital Stay: Payer: Medicare Other | Attending: Hematology and Oncology

## 2016-07-22 DIAGNOSIS — Z9221 Personal history of antineoplastic chemotherapy: Secondary | ICD-10-CM | POA: Insufficient documentation

## 2016-07-22 DIAGNOSIS — C8338 Diffuse large B-cell lymphoma, lymph nodes of multiple sites: Secondary | ICD-10-CM | POA: Diagnosis present

## 2016-07-22 DIAGNOSIS — C8330 Diffuse large B-cell lymphoma, unspecified site: Secondary | ICD-10-CM

## 2016-07-22 DIAGNOSIS — Z95828 Presence of other vascular implants and grafts: Secondary | ICD-10-CM

## 2016-07-22 LAB — CBC WITH DIFFERENTIAL/PLATELET
Basophils Absolute: 0 10*3/uL (ref 0–0.1)
Basophils Relative: 1 %
EOS PCT: 6 %
Eosinophils Absolute: 0.2 10*3/uL (ref 0–0.7)
HEMATOCRIT: 36.3 % — AB (ref 40.0–52.0)
Hemoglobin: 12.2 g/dL — ABNORMAL LOW (ref 13.0–18.0)
LYMPHS ABS: 1.5 10*3/uL (ref 1.0–3.6)
LYMPHS PCT: 34 %
MCH: 29.5 pg (ref 26.0–34.0)
MCHC: 33.6 g/dL (ref 32.0–36.0)
MCV: 87.6 fL (ref 80.0–100.0)
MONO ABS: 0.8 10*3/uL (ref 0.2–1.0)
Monocytes Relative: 19 %
Neutro Abs: 1.7 10*3/uL (ref 1.4–6.5)
Neutrophils Relative %: 40 %
PLATELETS: 154 10*3/uL (ref 150–440)
RBC: 4.14 MIL/uL — ABNORMAL LOW (ref 4.40–5.90)
RDW: 15.1 % — AB (ref 11.5–14.5)
WBC: 4.3 10*3/uL (ref 3.8–10.6)

## 2016-07-22 LAB — COMPREHENSIVE METABOLIC PANEL
ALT: 28 U/L (ref 17–63)
AST: 24 U/L (ref 15–41)
Albumin: 3.5 g/dL (ref 3.5–5.0)
Alkaline Phosphatase: 51 U/L (ref 38–126)
Anion gap: 4 — ABNORMAL LOW (ref 5–15)
BUN: 26 mg/dL — ABNORMAL HIGH (ref 6–20)
CO2: 30 mmol/L (ref 22–32)
Calcium: 9.4 mg/dL (ref 8.9–10.3)
Chloride: 105 mmol/L (ref 101–111)
Creatinine, Ser: 1.23 mg/dL (ref 0.61–1.24)
GFR calc Af Amer: >60 mL/min (ref >60)
GFR calc non Af Amer: 52 mL/min — ABNORMAL LOW (ref >60)
Glucose, Bld: 107 mg/dL — ABNORMAL HIGH (ref 65–99)
Potassium: 4 mmol/L (ref 3.5–5.1)
Sodium: 139 mmol/L (ref 135–145)
Total Bilirubin: 0.5 mg/dL (ref 0.3–1.2)
Total Protein: 6.1 g/dL — ABNORMAL LOW (ref 6.5–8.1)

## 2016-07-22 LAB — URIC ACID: Uric Acid, Serum: 4.6 mg/dL (ref 4.4–7.6)

## 2016-07-22 LAB — LACTATE DEHYDROGENASE: LDH: 154 U/L (ref 98–192)

## 2016-07-22 MED ORDER — HEPARIN SOD (PORK) LOCK FLUSH 100 UNIT/ML IV SOLN
500.0000 [IU] | Freq: Once | INTRAVENOUS | Status: AC
Start: 2016-07-22 — End: 2016-07-22
  Administered 2016-07-22: 500 [IU] via INTRAVENOUS

## 2016-07-22 MED ORDER — SODIUM CHLORIDE 0.9% FLUSH
10.0000 mL | INTRAVENOUS | Status: DC | PRN
  Administered 2016-07-22: 10 mL via INTRAVENOUS
  Filled 2016-07-22: qty 10

## 2016-09-09 ENCOUNTER — Ambulatory Visit: Payer: Medicare Other | Admitting: Hematology and Oncology

## 2016-09-09 ENCOUNTER — Other Ambulatory Visit: Payer: Medicare Other

## 2016-09-11 ENCOUNTER — Inpatient Hospital Stay (HOSPITAL_BASED_OUTPATIENT_CLINIC_OR_DEPARTMENT_OTHER): Payer: Medicare Other | Admitting: Hematology and Oncology

## 2016-09-11 ENCOUNTER — Inpatient Hospital Stay: Payer: Medicare Other | Attending: Hematology and Oncology

## 2016-09-11 VITALS — BP 158/76 | HR 54 | Temp 97.9°F | Resp 18 | Wt 209.5 lb

## 2016-09-11 DIAGNOSIS — I129 Hypertensive chronic kidney disease with stage 1 through stage 4 chronic kidney disease, or unspecified chronic kidney disease: Secondary | ICD-10-CM | POA: Diagnosis not present

## 2016-09-11 DIAGNOSIS — Z7901 Long term (current) use of anticoagulants: Secondary | ICD-10-CM | POA: Insufficient documentation

## 2016-09-11 DIAGNOSIS — M21371 Foot drop, right foot: Secondary | ICD-10-CM | POA: Diagnosis not present

## 2016-09-11 DIAGNOSIS — R634 Abnormal weight loss: Secondary | ICD-10-CM | POA: Insufficient documentation

## 2016-09-11 DIAGNOSIS — M549 Dorsalgia, unspecified: Secondary | ICD-10-CM | POA: Diagnosis not present

## 2016-09-11 DIAGNOSIS — N189 Chronic kidney disease, unspecified: Secondary | ICD-10-CM

## 2016-09-11 DIAGNOSIS — I82432 Acute embolism and thrombosis of left popliteal vein: Secondary | ICD-10-CM

## 2016-09-11 DIAGNOSIS — Z880 Allergy status to penicillin: Secondary | ICD-10-CM | POA: Diagnosis not present

## 2016-09-11 DIAGNOSIS — E1122 Type 2 diabetes mellitus with diabetic chronic kidney disease: Secondary | ICD-10-CM

## 2016-09-11 DIAGNOSIS — C8338 Diffuse large B-cell lymphoma, lymph nodes of multiple sites: Secondary | ICD-10-CM | POA: Diagnosis not present

## 2016-09-11 DIAGNOSIS — Z86718 Personal history of other venous thrombosis and embolism: Secondary | ICD-10-CM | POA: Diagnosis not present

## 2016-09-11 DIAGNOSIS — Z7982 Long term (current) use of aspirin: Secondary | ICD-10-CM | POA: Insufficient documentation

## 2016-09-11 DIAGNOSIS — Z87891 Personal history of nicotine dependence: Secondary | ICD-10-CM

## 2016-09-11 DIAGNOSIS — Z7952 Long term (current) use of systemic steroids: Secondary | ICD-10-CM | POA: Insufficient documentation

## 2016-09-11 DIAGNOSIS — Z79899 Other long term (current) drug therapy: Secondary | ICD-10-CM

## 2016-09-11 DIAGNOSIS — Z794 Long term (current) use of insulin: Secondary | ICD-10-CM

## 2016-09-11 DIAGNOSIS — Z95828 Presence of other vascular implants and grafts: Secondary | ICD-10-CM

## 2016-09-11 LAB — COMPREHENSIVE METABOLIC PANEL
ALT: 49 U/L (ref 17–63)
AST: 31 U/L (ref 15–41)
Albumin: 3.6 g/dL (ref 3.5–5.0)
Alkaline Phosphatase: 50 U/L (ref 38–126)
Anion gap: 5 (ref 5–15)
BUN: 32 mg/dL — ABNORMAL HIGH (ref 6–20)
CO2: 26 mmol/L (ref 22–32)
Calcium: 9.1 mg/dL (ref 8.9–10.3)
Chloride: 105 mmol/L (ref 101–111)
Creatinine, Ser: 1.33 mg/dL — ABNORMAL HIGH (ref 0.61–1.24)
GFR calc Af Amer: 55 mL/min — ABNORMAL LOW (ref >60)
GFR calc non Af Amer: 48 mL/min — ABNORMAL LOW (ref >60)
Glucose, Bld: 198 mg/dL — ABNORMAL HIGH (ref 65–99)
Potassium: 4.1 mmol/L (ref 3.5–5.1)
Sodium: 136 mmol/L (ref 135–145)
Total Bilirubin: 0.5 mg/dL (ref 0.3–1.2)
Total Protein: 6.1 g/dL — ABNORMAL LOW (ref 6.5–8.1)

## 2016-09-11 LAB — CBC WITH DIFFERENTIAL/PLATELET
Basophils Absolute: 0.1 10*3/uL (ref 0–0.1)
Basophils Relative: 1 %
Eosinophils Absolute: 0.3 10*3/uL (ref 0–0.7)
Eosinophils Relative: 4 %
HCT: 34.9 % — ABNORMAL LOW (ref 40.0–52.0)
Hemoglobin: 11.9 g/dL — ABNORMAL LOW (ref 13.0–18.0)
Lymphocytes Relative: 20 %
Lymphs Abs: 1.3 10*3/uL (ref 1.0–3.6)
MCH: 30 pg (ref 26.0–34.0)
MCHC: 34.1 g/dL (ref 32.0–36.0)
MCV: 88.1 fL (ref 80.0–100.0)
Monocytes Absolute: 0.7 10*3/uL (ref 0.2–1.0)
Monocytes Relative: 11 %
Neutro Abs: 4.4 10*3/uL (ref 1.4–6.5)
Neutrophils Relative %: 64 %
Platelets: 140 10*3/uL — ABNORMAL LOW (ref 150–440)
RBC: 3.97 MIL/uL — ABNORMAL LOW (ref 4.40–5.90)
RDW: 16.3 % — ABNORMAL HIGH (ref 11.5–14.5)
WBC: 6.7 10*3/uL (ref 3.8–10.6)

## 2016-09-11 LAB — URIC ACID: Uric Acid, Serum: 4.7 mg/dL (ref 4.4–7.6)

## 2016-09-11 LAB — LACTATE DEHYDROGENASE: LDH: 236 U/L — ABNORMAL HIGH (ref 98–192)

## 2016-09-11 MED ORDER — SODIUM CHLORIDE 0.9% FLUSH
10.0000 mL | INTRAVENOUS | Status: DC | PRN
  Administered 2016-09-11: 10 mL via INTRAVENOUS
  Filled 2016-09-11: qty 10

## 2016-09-11 MED ORDER — HEPARIN SOD (PORK) LOCK FLUSH 100 UNIT/ML IV SOLN
500.0000 [IU] | Freq: Once | INTRAVENOUS | Status: AC
  Administered 2016-09-11: 500 [IU] via INTRAVENOUS

## 2016-09-11 NOTE — Progress Notes (Signed)
Patient is having problems with his back.  He is currently having accupuncture and states it has helped.  Also states he is getting some use back in his right foot.

## 2016-09-11 NOTE — Progress Notes (Signed)
[facility] day:  09/11/2016   Chief Complaint: Hayden Martin is a 83 y.o. 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  HPI:  The patient was last seen by me in the medical oncology clinic on 06/10/2016.  At that time, he felt good.  He denied any B symptoms.  He had persistent right foot drop.  He was using an AFO.  He had follow-up with neurology.    During the interim, he has been having trouble with his back. He is doing acupuncture which has helped. He is getting movement back in his foot. He is no longer seeing Dr. Manuella Ghazi.  He denies any B symptoms.  He is taking Xarelto. He denies any bleeding.   Past Medical History:  Diagnosis Date  . Cancer ([place])   . Clotting disorder ([place])   . Diabetes mellitus without complication ([place])   . Heart disease   . Hypertension     Past Surgical History:  Procedure Laterality Date  . HERNIA REPAIR    . PERIPHERAL VASCULAR CATHETERIZATION N/A 01/18/2016   Procedure: IVC Filter Insertion;  Surgeon: Katha Cabal, MD;  Location: [facility];  Service: Cardiovascular;  Laterality: N/A;  . PERIPHERAL VASCULAR CATHETERIZATION N/A 01/21/2016   Procedure: Glori Luis Cath Insertion;  Surgeon: Algernon Huxley, MD;  Location: [facility];  Service: Cardiovascular;  Laterality: N/A;    No family history on file.  Social History:  reports that he quit smoking about 30 years ago. He smoked 1.50 packs per day. He has quit using smokeless tobacco. His alcohol and drug histories are not on file.  He was in the [employer].  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  He was discharged from [facility] on 02/20/2016.  He has 3 children who live locally.  He recently bought a new car [facility]).  The patient is alone today.  Allergies:  Allergies  Allergen Reactions  . Codeine Itching  . Penicillins Itching    Current  Medications: Current Outpatient Prescriptions  Medication Sig Dispense Refill  . allopurinol (ZYLOPRIM) 300 MG tablet TAKE ONE (1) TABLET EACH DAY 30 tablet 2  . aspirin EC 81 MG tablet Take 81 mg by mouth daily.    [name] atorvastatin (LIPITOR) 40 MG tablet Take 20 mg by mouth daily.    . carvedilol (COREG) 6.25 MG tablet TAKE ONE (1) TABLET BY MOUTH TWO (2) TIMES DAILY    . Cholecalciferol (VITAMIN D3) 10000 units TABS Take by mouth.    . furosemide (LASIX) 20 MG tablet Take 20 mg by mouth.    . Insulin Glargine (LANTUS [place]) Inject into the skin. Inject 35 units subcutaneously at bedtime.    . Insulin NPH Isophane & Regular (NOVOLIN 70/30 [place]) Inject 8 Units into the skin 3 (three) times daily.    [name] ketoconazole (NIZORAL) 2 % cream APPLY AT BEDTIME DAILY TO AFFECTED AREAS    . lidocaine-prilocaine (EMLA) cream Apply 1 application topically as needed. 30 g 3  . rivaroxaban (XARELTO) 20 MG TABS tablet Take 20 mg by mouth daily with supper.    [name] albuterol (PROVENTIL HFA) 108 (90 Base) MCG/ACT inhaler Inhale 2 puffs into the lungs every 4 (four) hours as needed for wheezing or shortness of breath. (Patient not taking: Reported on 03/14/2016) 1 Inhaler 0  . dronabinol (MARINOL) 2.5 MG capsule  Take 1 capsule (2.5 mg total) by mouth 2 (two) times daily before lunch and supper. (Patient not taking: Reported on 03/14/2016) 60 capsule 0  . ondansetron (ZOFRAN) 8 MG tablet Take 1 tablet (8 mg total) by mouth 2 (two) times daily as needed for nausea or vomiting. (Patient not taking: Reported on 06/10/2016) 30 tablet 0  . predniSONE (DELTASONE) 20 MG tablet Take 80 mg by mouth daily with breakfast. Take for 5 days starting day after chemo     No current facility-administered medications for this visit.    Facility-Administered Medications Ordered in Other Visits  Medication Dose Route Frequency Provider Last Rate Last Dose  . sodium chloride flush (NS) 0.9 % injection 10 mL  10 mL Intravenous PRN Cammie Sickle, MD   10 mL at 03/10/16 5465    Review of Systems:  GENERAL:  Feels "ok".  No fevers or sweats.  Weight up 11 pounds. PERFORMANCE STATUS (ECOG):  1 HEENT:  No visual changes, runny nose, sore throat, mouth sores or tenderness. Lungs: No shortness of breath or cough.  No hemoptysis. Cardiac:  No chest pain, palpitations, orthopnea, or PND. GI:  Appetite good.  No vomiting, diarrhea, constipation, or hematochezia. GU:  No urgency, frequency, dysuria, or hematuria. Musculoskeletal:  Back issues (chronic) s/p acupuncture, improved.  No muscle tenderness. Extremities:  No pain or swelling. Skin:  No rashes or skin changes. Neuro:  Right foot drop with brace (AFO), improved.  No headache, numbness or weakness, or coordination issues.  Endocrine:  Diabetes.  No thyroid issues, hot flashes or night sweats. Psych:  No mood changes, depression or anxiety. Pain:  No focal pain. Review of systems:  All other systems reviewed and found to be negative.  Physical Exam: Blood pressure (!) 158/76, pulse (!) 54, temperature 97.9 F (36.6 C), temperature source Tympanic, resp. rate 18, weight 209 lb 8 oz (95 kg). GENERAL:  Elderly gentleman sitting comfortably in the exam room in no acute distress.  He has a cane at his side. MENTAL STATUS:  Alert and oriented to person, place and time. HEAD:  Wearing an Scientist, clinical (histocompatibility and immunogenetics).  Scant gray hair.  Male pattern baldness.  Normocephalic, atraumatic, face symmetric, no Cushingoid features. EYES: Blue eyes.  Pupils equal round and reactive to light and accomodation.  No conjunctivitis or scleral icterus. ENT:  Hearing aide.  Oropharynx clear without lesion.  Tongue normal.  Mucous membranes moist.  RESPIRATORY:  Clear to auscultation without rales, wheezes or rhonchi. CARDIOVASCULAR:  Regular rate and rhythm without murmur, rub or gallop. ABDOMEN:  Soft, non-tender, with active bowel sounds, and no appreciable hepatosplenomegaly.  No masses. SKIN:   No rashes, ulcers or lesions. EXTREMITIES:  Right lower extremity brace (AFO).  1+ left ankle edema.  No skin discoloration or tenderness.  No palpable cords. LYMPH NODES: No palpable cervical, supraclavicular, axillary or inguinal adenopathy  NEUROLOGICAL:  Right sided foot drop.  Otherwise, lower extremity strength and sensation intact. PSYCH:  Appropriate.   Infusion on 09/11/2016  Component Date Value Ref Range Status  . Sodium 09/11/2016 136  135 - 145 mmol/L Final  . Potassium 09/11/2016 4.1  3.5 - 5.1 mmol/L Final  . Chloride 09/11/2016 105  101 - 111 mmol/L Final  . CO2 09/11/2016 26  22 - 32 mmol/L Final  . Glucose, Bld 09/11/2016 198* 65 - 99 mg/dL Final  . BUN 09/11/2016 32* 6 - 20 mg/dL Final  . Creatinine, Ser 09/11/2016 1.33* 0.61 - 1.24  mg/dL Final  . Calcium 09/11/2016 9.1  8.9 - 10.3 mg/dL Final  . Total Protein 09/11/2016 6.1* 6.5 - 8.1 g/dL Final  . Albumin 09/11/2016 3.6  3.5 - 5.0 g/dL Final  . AST 09/11/2016 31  15 - 41 U/L Final  . ALT 09/11/2016 49  17 - 63 U/L Final  . Alkaline Phosphatase 09/11/2016 50  38 - 126 U/L Final  . Total Bilirubin 09/11/2016 0.5  0.3 - 1.2 mg/dL Final  . GFR calc non Af Amer 09/11/2016 48* >60 mL/min Final  . GFR calc Af Amer 09/11/2016 55* >60 mL/min Final   Comment: (NOTE) The eGFR has been calculated using the CKD EPI equation. This calculation has not been validated in all clinical situations. eGFR's persistently <60 mL/min signify possible Chronic Kidney Disease.   . Anion gap 09/11/2016 5  5 - 15 Final  . WBC 09/11/2016 6.7  3.8 - 10.6 K/uL Final  . RBC 09/11/2016 3.97* 4.40 - 5.90 MIL/uL Final  . Hemoglobin 09/11/2016 11.9* 13.0 - 18.0 g/dL Final  . HCT 09/11/2016 34.9* 40.0 - 52.0 % Final  . MCV 09/11/2016 88.1  80.0 - 100.0 fL Final  . MCH 09/11/2016 30.0  26.0 - 34.0 pg Final  . MCHC 09/11/2016 34.1  32.0 - 36.0 g/dL Final  . RDW 09/11/2016 16.3* 11.5 - 14.5 % Final  . Platelets 09/11/2016 140* 150 - 440 K/uL Final   . Neutrophils Relative % 09/11/2016 64  % Final  . Neutro Abs 09/11/2016 4.4  1.4 - 6.5 K/uL Final  . Lymphocytes Relative 09/11/2016 20  % Final  . Lymphs Abs 09/11/2016 1.3  1.0 - 3.6 K/uL Final  . Monocytes Relative 09/11/2016 11  % Final  . Monocytes Absolute 09/11/2016 0.7  0.2 - 1.0 K/uL Final  . Eosinophils Relative 09/11/2016 4  % Final  . Eosinophils Absolute 09/11/2016 0.3  0 - 0.7 K/uL Final  . Basophils Relative 09/11/2016 1  % Final  . Basophils Absolute 09/11/2016 0.1  0 - 0.1 K/uL Final  . LDH 09/11/2016 236* 98 - 192 U/L Final  . Uric Acid, Serum 09/11/2016 4.7  4.4 - 7.6 mg/dL Final    Assessment:  Hayden Martin is a 83 y.o. 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  He presented with a 30-35 pound weight loss in 2-3 months.  CT-guided retroperitoneal lymph node biopsy on 01/21/2016 revealed a CD30 positive large B-cell lymphoma. Immunohistochemical studies were positive for CD20, CD30, BCL-2, BCL 6, Ki-67 (> 90%) and negative for CD10 and cyclin D1.  Bone marrow biopsy on 01/21/2016 revealed no evidence of lymphoma.  Flow cytometry was negative.  Cytogenetics were normal (46, XY).  Abdomen and pelvic CT scan on 01/07/2016 revealed multiple solid masses within the spleen.  There was extensive retroperitoneal, portal and mesenteric lymphadenopathy.  Retrocrural node measured 2.1 x 2.0 cm.  Periportal node measured 2.6 x 5.8 cm. Confluent nodal mass is in the retroperitoneum surrounding the great vessels measured 5.6 x 3.8 cmon the left and 5.3 x 2.9 cm on the right.  Left iliac node measured 1.7 x 2.6 cm.  There was possible pleural disease on the left.  The prostate was markedly enlarged.  There was no disease in the liver.  PET scan on 01/16/2016 revealed extensive hypermetabolic adenopathy within the neck, chest, abdomen and pelvis. The spleen was enlarged with multi focal hypermetabolic splenic lesions There was evidence of transperitoneal spread of  tumor within the upper abdomen.  There were bilateral pleural effusions.  Hepatitis serologies were negative on 01/16/2016. G6PD testing negative.  Echo on 01/18/2016 revealed an EF of 55-60%.   He received 6 cycles of mini-RCHOP (01/23/2016 - 05/16/2016).  Cycle #1 was complicated by tumor lysis syndrome, leukopenia, and symptomatic pleural effusions.  Diagnostic and 1 liter therapeutic left sided thoracentesis on 02/08/2016 revealed involvement with lymphoma.  He received OnPro Neulasta with cycle #2 - #6.  PET scan on 04/23/2016 revealed an excellent response to treatment. The extensive adenopathy seen on the prior PET-CT showed remarkable improvement.  There was no residual metabolically active lymphoma is identified. There was a moderate left pleural effusion and small right pleural effusion.  PET scan on 06/06/2016 revealed continued improvement and near resolution. There was only minimal retroperitoneal nodularity/adenopathy, much of which likely represents a residuum from previously treated lymphoma.  The residual periaortic lymph node measured 1.2 cm in short axis with maximum SUV 2.2 (formerly 1.4 cm and 3.6, respectively). The minimal residual nodal activity was Deau[place] 3.  He has a history of left lower extremity thrombosis.  He has had 2 clots 1 year apart and is on life long anticoagulation.  Bilateral lower extremity duplex on 01/16/2016 revealed DVT in the left lower extremity involving the calf veins and the left popliteal vein.  IVC filterwas placed on 01/18/2016.  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  There was interval resolution of the left calf vein DVT.  He is on Xarelto.  He has chronic renal insufficiency (Cr 1.3- 1.4).  Creatinine is 1.27 (CrCl 51 ml/min) today.  Chest CT angiogram on 02/02/2016 revealed no evidence of pulmonary emboli. There were  bilateral pleural effusions (moderate size left and small to moderate right).  Symptomatically, he denies any B symptoms.  Right foot drop is improving.  He has chronic back pain.  Exam reveals no adenopathy or hepatosplenomegaly.  Plan: 1.  Labs today:  CBC with diff, CMP, LDH, uric acid. 2.  Continue Xarelto.   3.  Port flush every 6 weeks 4.  RTC in 3 months for MD assessment, labs (CBC with diff, CMP, LDH, uric acid).   Lequita Asal, MD  09/11/2016, 3:59 PM

## 2016-10-06 ENCOUNTER — Other Ambulatory Visit: Payer: Self-pay | Admitting: Hematology and Oncology

## 2016-10-06 NOTE — Telephone Encounter (Signed)
Patient called and verified that he did not have gout, and there was not other reason he had been on allopurinol in the past other than treatment. Discussed with Dr. Mike Gip and she denied refill on allopurinol at present time as pt is not under active treatment and no longer requires medication.  Instructed patient voiced understanding.

## 2016-10-12 ENCOUNTER — Encounter: Payer: Self-pay | Admitting: Hematology and Oncology

## 2016-10-30 ENCOUNTER — Inpatient Hospital Stay: Payer: Medicare Other | Attending: Hematology and Oncology

## 2016-10-30 DIAGNOSIS — Z452 Encounter for adjustment and management of vascular access device: Secondary | ICD-10-CM | POA: Insufficient documentation

## 2016-10-30 DIAGNOSIS — C8338 Diffuse large B-cell lymphoma, lymph nodes of multiple sites: Secondary | ICD-10-CM | POA: Diagnosis not present

## 2016-10-30 DIAGNOSIS — Z9221 Personal history of antineoplastic chemotherapy: Secondary | ICD-10-CM | POA: Diagnosis not present

## 2016-10-30 DIAGNOSIS — Z95828 Presence of other vascular implants and grafts: Secondary | ICD-10-CM

## 2016-10-30 MED ORDER — HEPARIN SOD (PORK) LOCK FLUSH 100 UNIT/ML IV SOLN
INTRAVENOUS | Status: AC
  Filled 2016-10-30: qty 5

## 2016-10-30 MED ORDER — SODIUM CHLORIDE 0.9% FLUSH
10.0000 mL | INTRAVENOUS | Status: DC | PRN
Start: 2016-10-30 — End: 2016-10-30
  Administered 2016-10-30: 10 mL via INTRAVENOUS
  Filled 2016-10-30: qty 10

## 2016-10-30 MED ORDER — HEPARIN SOD (PORK) LOCK FLUSH 100 UNIT/ML IV SOLN
500.0000 [IU] | Freq: Once | INTRAVENOUS | Status: AC
  Administered 2016-10-30: 500 [IU] via INTRAVENOUS

## 2016-12-11 ENCOUNTER — Inpatient Hospital Stay (HOSPITAL_BASED_OUTPATIENT_CLINIC_OR_DEPARTMENT_OTHER): Payer: Medicare Other | Admitting: Hematology and Oncology

## 2016-12-11 ENCOUNTER — Other Ambulatory Visit: Payer: Self-pay | Admitting: *Deleted

## 2016-12-11 ENCOUNTER — Encounter: Payer: Self-pay | Admitting: Hematology and Oncology

## 2016-12-11 ENCOUNTER — Inpatient Hospital Stay: Payer: Medicare Other | Attending: Hematology and Oncology

## 2016-12-11 ENCOUNTER — Other Ambulatory Visit: Payer: Self-pay

## 2016-12-11 VITALS — BP 159/78 | HR 65 | Temp 98.4°F | Resp 20 | Wt 212.2 lb

## 2016-12-11 DIAGNOSIS — Z87891 Personal history of nicotine dependence: Secondary | ICD-10-CM | POA: Diagnosis not present

## 2016-12-11 DIAGNOSIS — Z880 Allergy status to penicillin: Secondary | ICD-10-CM | POA: Insufficient documentation

## 2016-12-11 DIAGNOSIS — Z79899 Other long term (current) drug therapy: Secondary | ICD-10-CM | POA: Insufficient documentation

## 2016-12-11 DIAGNOSIS — E1365 Other specified diabetes mellitus with hyperglycemia: Secondary | ICD-10-CM | POA: Diagnosis not present

## 2016-12-11 DIAGNOSIS — Z86718 Personal history of other venous thrombosis and embolism: Secondary | ICD-10-CM

## 2016-12-11 DIAGNOSIS — E1322 Other specified diabetes mellitus with diabetic chronic kidney disease: Secondary | ICD-10-CM

## 2016-12-11 DIAGNOSIS — G8929 Other chronic pain: Secondary | ICD-10-CM

## 2016-12-11 DIAGNOSIS — C8338 Diffuse large B-cell lymphoma, lymph nodes of multiple sites: Secondary | ICD-10-CM | POA: Diagnosis not present

## 2016-12-11 DIAGNOSIS — Z794 Long term (current) use of insulin: Secondary | ICD-10-CM | POA: Insufficient documentation

## 2016-12-11 DIAGNOSIS — C8330 Diffuse large B-cell lymphoma, unspecified site: Secondary | ICD-10-CM

## 2016-12-11 DIAGNOSIS — M21371 Foot drop, right foot: Secondary | ICD-10-CM

## 2016-12-11 DIAGNOSIS — N400 Enlarged prostate without lower urinary tract symptoms: Secondary | ICD-10-CM | POA: Insufficient documentation

## 2016-12-11 DIAGNOSIS — N189 Chronic kidney disease, unspecified: Secondary | ICD-10-CM

## 2016-12-11 DIAGNOSIS — Z7901 Long term (current) use of anticoagulants: Secondary | ICD-10-CM | POA: Insufficient documentation

## 2016-12-11 DIAGNOSIS — I129 Hypertensive chronic kidney disease with stage 1 through stage 4 chronic kidney disease, or unspecified chronic kidney disease: Secondary | ICD-10-CM

## 2016-12-11 DIAGNOSIS — M549 Dorsalgia, unspecified: Secondary | ICD-10-CM | POA: Insufficient documentation

## 2016-12-11 DIAGNOSIS — I82432 Acute embolism and thrombosis of left popliteal vein: Secondary | ICD-10-CM

## 2016-12-11 DIAGNOSIS — Z7982 Long term (current) use of aspirin: Secondary | ICD-10-CM

## 2016-12-11 DIAGNOSIS — Z95828 Presence of other vascular implants and grafts: Secondary | ICD-10-CM

## 2016-12-11 LAB — CBC WITH DIFFERENTIAL/PLATELET
Basophils Absolute: 0.1 10*3/uL (ref 0–0.1)
Basophils Relative: 1 %
Eosinophils Absolute: 0.3 10*3/uL (ref 0–0.7)
Eosinophils Relative: 5 %
HCT: 37.7 % — ABNORMAL LOW (ref 40.0–52.0)
Hemoglobin: 12.5 g/dL — ABNORMAL LOW (ref 13.0–18.0)
Lymphocytes Relative: 18 %
Lymphs Abs: 1.1 10*3/uL (ref 1.0–3.6)
MCH: 29.8 pg (ref 26.0–34.0)
MCHC: 33.2 g/dL (ref 32.0–36.0)
MCV: 89.7 fL (ref 80.0–100.0)
Monocytes Absolute: 0.6 10*3/uL (ref 0.2–1.0)
Monocytes Relative: 10 %
Neutro Abs: 3.8 10*3/uL (ref 1.4–6.5)
Neutrophils Relative %: 66 %
Platelets: 134 10*3/uL — ABNORMAL LOW (ref 150–440)
RBC: 4.21 MIL/uL — ABNORMAL LOW (ref 4.40–5.90)
RDW: 14.3 % (ref 11.5–14.5)
WBC: 5.8 10*3/uL (ref 3.8–10.6)

## 2016-12-11 LAB — COMPREHENSIVE METABOLIC PANEL
ALT: 27 U/L (ref 17–63)
AST: 17 U/L (ref 15–41)
Albumin: 3.8 g/dL (ref 3.5–5.0)
Alkaline Phosphatase: 47 U/L (ref 38–126)
Anion gap: 10 (ref 5–15)
BUN: 39 mg/dL — ABNORMAL HIGH (ref 6–20)
CO2: 24 mmol/L (ref 22–32)
Calcium: 9.2 mg/dL (ref 8.9–10.3)
Chloride: 103 mmol/L (ref 101–111)
Creatinine, Ser: 1.95 mg/dL — ABNORMAL HIGH (ref 0.61–1.24)
GFR calc Af Amer: 35 mL/min — ABNORMAL LOW (ref >60)
GFR calc non Af Amer: 30 mL/min — ABNORMAL LOW (ref >60)
Glucose, Bld: 302 mg/dL — ABNORMAL HIGH (ref 65–99)
Potassium: 4.3 mmol/L (ref 3.5–5.1)
Sodium: 137 mmol/L (ref 135–145)
Total Bilirubin: 0.7 mg/dL (ref 0.3–1.2)
Total Protein: 6.4 g/dL — ABNORMAL LOW (ref 6.5–8.1)

## 2016-12-11 LAB — LACTATE DEHYDROGENASE: LDH: 133 U/L (ref 98–192)

## 2016-12-11 LAB — URIC ACID: Uric Acid, Serum: 7.9 mg/dL — ABNORMAL HIGH (ref 4.4–7.6)

## 2016-12-11 MED ORDER — HEPARIN SOD (PORK) LOCK FLUSH 100 UNIT/ML IV SOLN
500.0000 [IU] | INTRAVENOUS | Status: AC | PRN
  Administered 2016-12-11: 500 [IU]

## 2016-12-11 MED ORDER — SODIUM CHLORIDE 0.9% FLUSH
10.0000 mL | INTRAVENOUS | Status: AC | PRN
  Administered 2016-12-11: 10 mL
  Filled 2016-12-11: qty 10

## 2016-12-11 NOTE — Progress Notes (Signed)
Pt in for follow up of multiple myeloma, labs and port flush.  Denies any difficulties or concerns at this time.  States had flu vaccine yesterday.

## 2016-12-11 NOTE — Progress Notes (Signed)
[facility] day:  12/11/2016   Chief Complaint: Hayden Martin is a 83 y.o. 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  HPI:  The patient was last seen by me in the medical oncology clinic on 09/11/2016.  At that time, he denied any B symptoms.  Right foot drop was improving.  He had chronic back pain.  Exam revealed no adenopathy or hepatosplenomegaly.  During the interim, patient doing well. Patient with chronic back pain.  He denies pain in the office today. Patient denies any B symptoms. He is eating well, with no significant weight loss. Patient with elevated blood sugar today. Glucose is 302. Patient ate lunch prior to blood draw. He ate pecan pie today. He notes that his pre-prandial sugars have been elevated over the last month. Patient states, "I have been eating a lot of fresh peaches".    Past Medical History:  Diagnosis Date  . Cancer ([place])   . Clotting disorder ([place])   . Diabetes mellitus without complication ([place])   . Heart disease   . Hypertension     Past Surgical History:  Procedure Laterality Date  . HERNIA REPAIR    . PERIPHERAL VASCULAR CATHETERIZATION N/A 01/18/2016   Procedure: IVC Filter Insertion;  Surgeon: Katha Cabal, MD;  Location: [facility];  Service: Cardiovascular;  Laterality: N/A;  . PERIPHERAL VASCULAR CATHETERIZATION N/A 01/21/2016   Procedure: Glori Luis Cath Insertion;  Surgeon: Algernon Huxley, MD;  Location: [facility];  Service: Cardiovascular;  Laterality: N/A;    History reviewed. No pertinent family history.  Social History:  reports that he quit smoking about 30 years ago. He smoked 1.50 packs per day. He has quit using smokeless tobacco. His alcohol and drug histories are not on file.  He was in the [employer].  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  He was discharged from [facility] on 02/20/2016.  He has  3 children who live locally.  He recently bought a new car [facility]).  The patient is alone today.  Allergies:  Allergies  Allergen Reactions  . Codeine Itching  . Penicillins Itching    Current Medications: Current Outpatient Prescriptions  Medication Sig Dispense Refill  . albuterol (PROVENTIL HFA) 108 (90 Base) MCG/ACT inhaler Inhale 2 puffs into the lungs every 4 (four) hours as needed for wheezing or shortness of breath. 1 Inhaler 0  . allopurinol (ZYLOPRIM) 300 MG tablet TAKE ONE (1) TABLET EACH DAY 30 tablet 2  . aspirin EC 81 MG tablet Take 81 mg by mouth daily.    [name] atorvastatin (LIPITOR) 40 MG tablet Take 20 mg by mouth daily.    . carvedilol (COREG) 6.25 MG tablet TAKE ONE (1) TABLET BY MOUTH TWO (2) TIMES DAILY    . Cholecalciferol (VITAMIN D3) 10000 units TABS Take by mouth.    . furosemide (LASIX) 20 MG tablet Take 20 mg by mouth.    . Insulin Glargine (LANTUS [place]) Inject into the skin. Inject 35 units subcutaneously at bedtime.    . Insulin NPH Isophane & Regular (NOVOLIN 70/30 [place]) Inject 8 Units into the skin 3 (three) times daily.    [name] ketoconazole (NIZORAL) 2 % cream APPLY AT BEDTIME DAILY TO AFFECTED AREAS    . rivaroxaban (XARELTO) 20 MG TABS tablet Take 20 mg by mouth daily with supper.    [name]  dronabinol (MARINOL) 2.5 MG capsule Take 1 capsule (2.5 mg total) by mouth 2 (two) times daily before lunch and supper. (Patient not taking: Reported on 12/11/2016) 60 capsule 0  . lidocaine-prilocaine (EMLA) cream Apply 1 application topically as needed. (Patient not taking: Reported on 12/11/2016) 30 g 3  . ondansetron (ZOFRAN) 8 MG tablet Take 1 tablet (8 mg total) by mouth 2 (two) times daily as needed for nausea or vomiting. (Patient not taking: Reported on 06/10/2016) 30 tablet 0   No current facility-administered medications for this visit.    Facility-Administered Medications Ordered in Other Visits  Medication Dose Route Frequency Provider Last Rate Last Dose  .  sodium chloride flush (NS) 0.9 % injection 10 mL  10 mL Intravenous PRN Earna Coder, MD   10 mL at 03/10/16 3731    Review of Systems:  GENERAL:  Feels "good".  No fevers or sweats.  Weight up 3 pounds. PERFORMANCE STATUS (ECOG):  1 HEENT:  No visual changes, runny nose, sore throat, mouth sores or tenderness. Lungs: No shortness of breath or cough.  No hemoptysis. Cardiac:  No chest pain, palpitations, orthopnea, or PND. GI:  Appetite good.  No vomiting, diarrhea, constipation, or hematochezia. GU:  No urgency, frequency, dysuria, or hematuria. Musculoskeletal:  Back issues (chronic) s/p acupuncture, improved.  No muscle tenderness. Extremities:  No pain or swelling. Skin:  No rashes or skin changes. Neuro:  Right foot drop with brace (AFO), improved.  No headache, numbness or weakness, or coordination issues.  Endocrine:  Diabetes (see HPI).  No thyroid issues, hot flashes or night sweats. Psych:  No mood changes, depression or anxiety. Pain:  No focal pain. Review of systems:  All other systems reviewed and found to be negative.  Physical Exam: Blood pressure (!) 159/78, pulse 65, temperature 98.4 F (36.9 C), temperature source Tympanic, resp. rate 20, weight 212 lb 4 oz (96.3 kg). GENERAL:  Elderly gentleman sitting comfortably in the exam room in no acute distress.  He has a cane at his side. MENTAL STATUS:  Alert and oriented to person, place and time. HEAD:  Wearing a blue Theatre manager.  Scant gray hair.  Male pattern baldness.  Normocephalic, atraumatic, face symmetric, no Cushingoid features. EYES: Blue eyes.  Pupils equal round and reactive to light and accomodation.  No conjunctivitis or scleral icterus. ENT:  Hearing aide.  Oropharynx clear without lesion.  Tongue normal.  Mucous membranes moist.  RESPIRATORY:  Clear to auscultation without rales, wheezes or rhonchi. CARDIOVASCULAR:  Regular rate and rhythm without murmur, rub or gallop. ABDOMEN:  Soft,  non-tender, with active bowel sounds, and no appreciable hepatosplenomegaly.  No masses. SKIN:  No rashes, ulcers or lesions. EXTREMITIES:  Right lower extremity brace (AFO).  1+ left ankle edema.  No skin discoloration or tenderness.  No palpable cords. LYMPH NODES: No palpable cervical, supraclavicular, axillary or inguinal adenopathy  NEUROLOGICAL:  Right sided foot drop, improved.  Otherwise, lower extremity strength and sensation intact. PSYCH:  Appropriate.   Infusion on 12/11/2016  Component Date Value Ref Range Status  . WBC 12/11/2016 5.8  3.8 - 10.6 K/uL Final  . RBC 12/11/2016 4.21* 4.40 - 5.90 MIL/uL Final  . Hemoglobin 12/11/2016 12.5* 13.0 - 18.0 g/dL Final  . HCT 53/55/6651 37.7* 40.0 - 52.0 % Final  . MCV 12/11/2016 89.7  80.0 - 100.0 fL Final  . MCH 12/11/2016 29.8  26.0 - 34.0 pg Final  . MCHC 12/11/2016 33.2  32.0 - 36.0  g/dL Final  . RDW 12/11/2016 14.3  11.5 - 14.5 % Final  . Platelets 12/11/2016 134* 150 - 440 K/uL Final  . Neutrophils Relative % 12/11/2016 66  % Final  . Neutro Abs 12/11/2016 3.8  1.4 - 6.5 K/uL Final  . Lymphocytes Relative 12/11/2016 18  % Final  . Lymphs Abs 12/11/2016 1.1  1.0 - 3.6 K/uL Final  . Monocytes Relative 12/11/2016 10  % Final  . Monocytes Absolute 12/11/2016 0.6  0.2 - 1.0 K/uL Final  . Eosinophils Relative 12/11/2016 5  % Final  . Eosinophils Absolute 12/11/2016 0.3  0 - 0.7 K/uL Final  . Basophils Relative 12/11/2016 1  % Final  . Basophils Absolute 12/11/2016 0.1  0 - 0.1 K/uL Final  . Sodium 12/11/2016 137  135 - 145 mmol/L Final  . Potassium 12/11/2016 4.3  3.5 - 5.1 mmol/L Final  . Chloride 12/11/2016 103  101 - 111 mmol/L Final  . CO2 12/11/2016 24  22 - 32 mmol/L Final  . Glucose, Bld 12/11/2016 302* 65 - 99 mg/dL Final  . BUN 12/11/2016 39* 6 - 20 mg/dL Final  . Creatinine, Ser 12/11/2016 1.95* 0.61 - 1.24 mg/dL Final  . Calcium 12/11/2016 9.2  8.9 - 10.3 mg/dL Final  . Total Protein 12/11/2016 6.4* 6.5 - 8.1 g/dL  Final  . Albumin 12/11/2016 3.8  3.5 - 5.0 g/dL Final  . AST 12/11/2016 17  15 - 41 U/L Final  . ALT 12/11/2016 27  17 - 63 U/L Final  . Alkaline Phosphatase 12/11/2016 47  38 - 126 U/L Final  . Total Bilirubin 12/11/2016 0.7  0.3 - 1.2 mg/dL Final  . GFR calc non Af Amer 12/11/2016 30* >60 mL/min Final  . GFR calc Af Amer 12/11/2016 35* >60 mL/min Final   Comment: (NOTE) The eGFR has been calculated using the CKD EPI equation. This calculation has not been validated in all clinical situations. eGFR's persistently <60 mL/min signify possible Chronic Kidney Disease.   . Anion gap 12/11/2016 10  5 - 15 Final  . LDH 12/11/2016 133  98 - 192 U/L Final    Assessment:  HOBART MARTE is a 83 y.o. 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  He presented with a 30-35 pound weight loss in 2-3 months.  CT-guided retroperitoneal lymph node biopsy on 01/21/2016 revealed a CD30 positive large B-cell lymphoma. Immunohistochemical studies were positive for CD20, CD30, BCL-2, BCL 6, Ki-67 (> 90%) and negative for CD10 and cyclin D1.  Bone marrow biopsy on 01/21/2016 revealed no evidence of lymphoma.  Flow cytometry was negative.  Cytogenetics were normal (46, XY).  Abdomen and pelvic CT scan on 01/07/2016 revealed multiple solid masses within the spleen.  There was extensive retroperitoneal, portal and mesenteric lymphadenopathy.  Retrocrural node measured 2.1 x 2.0 cm.  Periportal node measured 2.6 x 5.8 cm. Confluent nodal mass is in the retroperitoneum surrounding the great vessels measured 5.6 x 3.8 cmon the left and 5.3 x 2.9 cm on the right.  Left iliac node measured 1.7 x 2.6 cm.  There was possible pleural disease on the left.  The prostate was markedly enlarged.  There was no disease in the liver.  PET scan on 01/16/2016 revealed extensive hypermetabolic adenopathy within the neck, chest, abdomen and pelvis. The spleen was enlarged with multi focal hypermetabolic splenic lesions  There was evidence of transperitoneal spread of tumor within the upper abdomen. There were bilateral pleural effusions.  Hepatitis serologies were  negative on 01/16/2016. G6PD testing negative.  Echo on 01/18/2016 revealed an EF of 55-60%.   He received 6 cycles of mini-RCHOP (01/23/2016 - 05/16/2016).  Cycle #1 was complicated by tumor lysis syndrome, leukopenia, and symptomatic pleural effusions.  Diagnostic and 1 liter therapeutic left sided thoracentesis on 02/08/2016 revealed involvement with lymphoma.  He received OnPro Neulasta with cycle #2 - #6.  PET scan on 04/23/2016 revealed an excellent response to treatment. The extensive adenopathy seen on the prior PET-CT showed remarkable improvement.  There was no residual metabolically active lymphoma is identified. There was a moderate left pleural effusion and small right pleural effusion.  PET scan on 06/06/2016 revealed continued improvement and near resolution. There was only minimal retroperitoneal nodularity/adenopathy, much of which likely represents a residuum from previously treated lymphoma.  The residual periaortic lymph node measured 1.2 cm in short axis with maximum SUV 2.2 (formerly 1.4 cm and 3.6, respectively). The minimal residual nodal activity was [place] 3.  He has a history of left lower extremity thrombosis.  He has had 2 clots 1 year apart and is on life long anticoagulation.  Bilateral lower extremity duplex on 01/16/2016 revealed DVT in the left lower extremity involving the calf veins and the left popliteal vein.  IVC filterwas placed on 01/18/2016.  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  There was interval resolution of the left calf vein DVT.  He is on Xarelto.  He has chronic renal insufficiency (Cr 1.3- 1.4).  Creatinine is 1.27 (CrCl 51 ml/min) today.  Chest CT angiogram on 02/02/2016  revealed no evidence of pulmonary emboli. There were bilateral pleural effusions (moderate size left and small to moderate right).  Symptomatically, he denies any B symptoms.   He has chronic back pain.  Exam reveals no adenopathy or hepatosplenomegaly. Glucose elevated at 302. Renal function elevated; BUN 39 with a creatinine of 1.95.  Plan: 1.  Labs today:  CBC with diff, CMP, LDH, uric acid. 2.  Continue Xarelto as previously prescribed.   3.  Port flush every 6 weeks. 4.  Discuss elevated blood sugars. Patient eating a lot of fresh fruits. Advised to cut back on fruits, as they have a great deal of natural sugars. Patient encouraged to speak with PCP about uncontrolled sugars over the last month. 5.  Discuss increase in renal function. Patient encouraged to increase fluid intake. Copy of today's labs sent via [facility] to Dr. Tonette Bihari office for PCP review.  6.  RTC in 3 months for MD assessment, labs (CBC with diff, CMP, LDH, uric acid).   Honor Loh, NP  12/11/2016, 2:52 PM   I saw and evaluated the patient, participating in the key portions of the service and reviewing pertinent diagnostic studies and records.  I reviewed the nurse practitioner's note and agree with the findings and the plan.  The assessment and plan were discussed with the patient.  A few questions were asked by the patient and answered.   Nolon Stalls, MD 12/11/2016,2:52 PM

## 2017-01-29 ENCOUNTER — Inpatient Hospital Stay: Payer: Medicare Other | Attending: Hematology and Oncology

## 2017-01-29 DIAGNOSIS — C8338 Diffuse large B-cell lymphoma, lymph nodes of multiple sites: Secondary | ICD-10-CM | POA: Diagnosis not present

## 2017-01-29 DIAGNOSIS — Z9221 Personal history of antineoplastic chemotherapy: Secondary | ICD-10-CM | POA: Insufficient documentation

## 2017-01-29 DIAGNOSIS — Z452 Encounter for adjustment and management of vascular access device: Secondary | ICD-10-CM | POA: Diagnosis not present

## 2017-01-29 DIAGNOSIS — Z95828 Presence of other vascular implants and grafts: Secondary | ICD-10-CM

## 2017-01-29 MED ORDER — SODIUM CHLORIDE 0.9% FLUSH
10.0000 mL | INTRAVENOUS | Status: AC | PRN
  Administered 2017-01-29: 10 mL
  Filled 2017-01-29: qty 10

## 2017-01-29 MED ORDER — HEPARIN SOD (PORK) LOCK FLUSH 100 UNIT/ML IV SOLN
500.0000 [IU] | INTRAVENOUS | Status: AC | PRN
  Administered 2017-01-29: 500 [IU]

## 2017-03-12 ENCOUNTER — Other Ambulatory Visit: Payer: Self-pay

## 2017-03-12 ENCOUNTER — Inpatient Hospital Stay: Payer: Medicare Other | Attending: Hematology and Oncology

## 2017-03-12 ENCOUNTER — Encounter: Payer: Self-pay | Admitting: Hematology and Oncology

## 2017-03-12 ENCOUNTER — Inpatient Hospital Stay (HOSPITAL_BASED_OUTPATIENT_CLINIC_OR_DEPARTMENT_OTHER): Payer: Medicare Other | Admitting: Hematology and Oncology

## 2017-03-12 VITALS — BP 168/63 | HR 56 | Temp 97.9°F | Ht 71.0 in | Wt 215.7 lb

## 2017-03-12 DIAGNOSIS — Z794 Long term (current) use of insulin: Secondary | ICD-10-CM | POA: Insufficient documentation

## 2017-03-12 DIAGNOSIS — Z7982 Long term (current) use of aspirin: Secondary | ICD-10-CM | POA: Insufficient documentation

## 2017-03-12 DIAGNOSIS — N400 Enlarged prostate without lower urinary tract symptoms: Secondary | ICD-10-CM | POA: Diagnosis not present

## 2017-03-12 DIAGNOSIS — Z86718 Personal history of other venous thrombosis and embolism: Secondary | ICD-10-CM

## 2017-03-12 DIAGNOSIS — N189 Chronic kidney disease, unspecified: Secondary | ICD-10-CM

## 2017-03-12 DIAGNOSIS — I129 Hypertensive chronic kidney disease with stage 1 through stage 4 chronic kidney disease, or unspecified chronic kidney disease: Secondary | ICD-10-CM

## 2017-03-12 DIAGNOSIS — R600 Localized edema: Secondary | ICD-10-CM | POA: Insufficient documentation

## 2017-03-12 DIAGNOSIS — G8929 Other chronic pain: Secondary | ICD-10-CM | POA: Insufficient documentation

## 2017-03-12 DIAGNOSIS — C8330 Diffuse large B-cell lymphoma, unspecified site: Secondary | ICD-10-CM

## 2017-03-12 DIAGNOSIS — N183 Chronic kidney disease, stage 3 unspecified: Secondary | ICD-10-CM

## 2017-03-12 DIAGNOSIS — Z79899 Other long term (current) drug therapy: Secondary | ICD-10-CM | POA: Diagnosis not present

## 2017-03-12 DIAGNOSIS — E1122 Type 2 diabetes mellitus with diabetic chronic kidney disease: Secondary | ICD-10-CM | POA: Diagnosis not present

## 2017-03-12 DIAGNOSIS — Z87891 Personal history of nicotine dependence: Secondary | ICD-10-CM

## 2017-03-12 DIAGNOSIS — I82432 Acute embolism and thrombosis of left popliteal vein: Secondary | ICD-10-CM

## 2017-03-12 DIAGNOSIS — Z880 Allergy status to penicillin: Secondary | ICD-10-CM | POA: Insufficient documentation

## 2017-03-12 DIAGNOSIS — M549 Dorsalgia, unspecified: Secondary | ICD-10-CM

## 2017-03-12 DIAGNOSIS — Z7901 Long term (current) use of anticoagulants: Secondary | ICD-10-CM

## 2017-03-12 DIAGNOSIS — C8338 Diffuse large B-cell lymphoma, lymph nodes of multiple sites: Secondary | ICD-10-CM

## 2017-03-12 DIAGNOSIS — Z800 Family history of malignant neoplasm of digestive organs: Secondary | ICD-10-CM

## 2017-03-12 DIAGNOSIS — Z95828 Presence of other vascular implants and grafts: Secondary | ICD-10-CM

## 2017-03-12 LAB — CBC WITH DIFFERENTIAL/PLATELET
Basophils Absolute: 0.1 10*3/uL (ref 0–0.1)
Basophils Relative: 1 %
Eosinophils Absolute: 0.3 10*3/uL (ref 0–0.7)
Eosinophils Relative: 4 %
HCT: 37 % — ABNORMAL LOW (ref 40.0–52.0)
Hemoglobin: 12.6 g/dL — ABNORMAL LOW (ref 13.0–18.0)
Lymphocytes Relative: 23 %
Lymphs Abs: 1.8 10*3/uL (ref 1.0–3.6)
MCH: 30.3 pg (ref 26.0–34.0)
MCHC: 33.9 g/dL (ref 32.0–36.0)
MCV: 89.5 fL (ref 80.0–100.0)
Monocytes Absolute: 0.7 10*3/uL (ref 0.2–1.0)
Monocytes Relative: 9 %
Neutro Abs: 4.8 10*3/uL (ref 1.4–6.5)
Neutrophils Relative %: 63 %
Platelets: 145 10*3/uL — ABNORMAL LOW (ref 150–440)
RBC: 4.14 MIL/uL — ABNORMAL LOW (ref 4.40–5.90)
RDW: 15 % — ABNORMAL HIGH (ref 11.5–14.5)
WBC: 7.8 10*3/uL (ref 3.8–10.6)

## 2017-03-12 LAB — COMPREHENSIVE METABOLIC PANEL
ALT: 31 U/L (ref 17–63)
AST: 20 U/L (ref 15–41)
Albumin: 3.7 g/dL (ref 3.5–5.0)
Alkaline Phosphatase: 48 U/L (ref 38–126)
Anion gap: 6 (ref 5–15)
BUN: 31 mg/dL — ABNORMAL HIGH (ref 6–20)
CO2: 28 mmol/L (ref 22–32)
Calcium: 9.3 mg/dL (ref 8.9–10.3)
Chloride: 103 mmol/L (ref 101–111)
Creatinine, Ser: 1.49 mg/dL — ABNORMAL HIGH (ref 0.61–1.24)
GFR calc Af Amer: 48 mL/min — ABNORMAL LOW (ref >60)
GFR calc non Af Amer: 42 mL/min — ABNORMAL LOW (ref >60)
Glucose, Bld: 99 mg/dL (ref 65–99)
Potassium: 4.7 mmol/L (ref 3.5–5.1)
Sodium: 137 mmol/L (ref 135–145)
Total Bilirubin: 0.6 mg/dL (ref 0.3–1.2)
Total Protein: 6.5 g/dL (ref 6.5–8.1)

## 2017-03-12 LAB — URIC ACID: Uric Acid, Serum: 6.9 mg/dL (ref 4.4–7.6)

## 2017-03-12 LAB — LACTATE DEHYDROGENASE: LDH: 144 U/L (ref 98–192)

## 2017-03-12 MED ORDER — HEPARIN SOD (PORK) LOCK FLUSH 100 UNIT/ML IV SOLN
500.0000 [IU] | Freq: Once | INTRAVENOUS | Status: AC
  Administered 2017-03-12: 500 [IU] via INTRAVENOUS

## 2017-03-12 MED ORDER — SODIUM CHLORIDE 0.9% FLUSH
10.0000 mL | INTRAVENOUS | Status: DC | PRN
  Administered 2017-03-12: 10 mL via INTRAVENOUS
  Filled 2017-03-12: qty 10

## 2017-03-12 NOTE — Progress Notes (Signed)
[facility] day:  03/12/2017   Chief Complaint: Hayden Martin is a 84 y.o. 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  HPI:  The patient was last seen by me in the medical oncology clinic on 12/11/2016.  At that time, he denied any B symptoms.   He had chronic back pain.  Exam revealed no adenopathy or hepatosplenomegaly. Glucose was 302. Renal function was elevated; BUN 39 with a creatinine of 1.95.  During the interim, he has felt "alright".  He describes his blood pressure "being messed up for 2 months".  He states lisinopril 10 mg a day has helped.  He denies any B symptoms.  He denies any adenopathy, bruising or bleeding.   Past Medical History:  Diagnosis Date  . Cancer ([place])   . Clotting disorder ([place])   . Diabetes mellitus without complication ([place])   . Heart disease   . Hypertension     Past Surgical History:  Procedure Laterality Date  . HERNIA REPAIR    . PERIPHERAL VASCULAR CATHETERIZATION N/A 01/18/2016   Procedure: IVC Filter Insertion;  Surgeon: Katha Cabal, MD;  Location: [facility];  Service: Cardiovascular;  Laterality: N/A;  . PERIPHERAL VASCULAR CATHETERIZATION N/A 01/21/2016   Procedure: Glori Luis Cath Insertion;  Surgeon: Algernon Huxley, MD;  Location: [facility];  Service: Cardiovascular;  Laterality: N/A;    History reviewed. No pertinent family history.  Social History:  reports that he quit smoking about 31 years ago. He smoked 1.50 packs per day. He has quit using smokeless tobacco. His alcohol and drug histories are not on file.  He was in the [employer].  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  He was discharged from [facility] on 02/20/2016.  He has 3 children who live locally.  He recently bought a new car [facility]).  He describes recently being a passenger in a [employer] airplane.  The patient is alone  today.  Allergies:  Allergies  Allergen Reactions  . Codeine Itching  . Penicillins Itching    Current Medications: Current Outpatient Medications  Medication Sig Dispense Refill  . albuterol (PROVENTIL HFA) 108 (90 Base) MCG/ACT inhaler Inhale 2 puffs into the lungs every 4 (four) hours as needed for wheezing or shortness of breath. 1 Inhaler 0  . allopurinol (ZYLOPRIM) 300 MG tablet TAKE ONE (1) TABLET EACH DAY 30 tablet 2  . aspirin EC 81 MG tablet Take 81 mg by mouth daily.    [name] atorvastatin (LIPITOR) 40 MG tablet Take 20 mg by mouth daily.    . carvedilol (COREG) 6.25 MG tablet TAKE ONE (1) TABLET BY MOUTH TWO (2) TIMES DAILY    . Cholecalciferol (VITAMIN D3) 10000 units TABS Take by mouth.    . dronabinol (MARINOL) 2.5 MG capsule Take 1 capsule (2.5 mg total) by mouth 2 (two) times daily before lunch and supper. 60 capsule 0  . furosemide (LASIX) 20 MG tablet Take 20 mg by mouth.    . Insulin Glargine (LANTUS [place]) Inject into the skin. Inject 35 units subcutaneously at bedtime.    . Insulin NPH Isophane & Regular (NOVOLIN 70/30 [place]) Inject 8 Units into the skin 3 (three) times daily.    [name] ketoconazole (NIZORAL) 2 % cream APPLY AT BEDTIME DAILY TO AFFECTED AREAS    . lidocaine-prilocaine (EMLA) cream Apply 1 application  topically as needed. 30 g 3  . lisinopril (PRINIVIL,ZESTRIL) 10 MG tablet Take 10 mg by mouth daily.    . ondansetron (ZOFRAN) 8 MG tablet Take 1 tablet (8 mg total) by mouth 2 (two) times daily as needed for nausea or vomiting. 30 tablet 0  . rivaroxaban (XARELTO) 20 MG TABS tablet Take 20 mg by mouth daily with supper.     No current facility-administered medications for this visit.    Facility-Administered Medications Ordered in Other Visits  Medication Dose Route Frequency Provider Last Rate Last Dose  . sodium chloride flush (NS) 0.9 % injection 10 mL  10 mL Intravenous PRN Cammie Sickle, MD   10 mL at 03/10/16 2542    Review of Systems:  GENERAL:   Feels "good".  No fevers or sweats.  Weight up 3 pounds. PERFORMANCE STATUS (ECOG):  1 HEENT:  No visual changes, runny nose, sore throat, mouth sores or tenderness. Lungs: No shortness of breath or cough.  No hemoptysis. Cardiac:  Blood pressure issues (see HPI).  No chest pain, palpitations, orthopnea, or PND. GI:  Appetite good.  No vomiting, diarrhea, constipation, or hematochezia. GU:  No urgency, frequency, dysuria, or hematuria. Musculoskeletal:  Back issues (chronic) s/p acupuncture.  No muscle tenderness. Extremities:  No pain or swelling. Skin:  No rashes or skin changes. Neuro:  Right foot drop with brace (AFO), improved.  No headache, numbness or weakness, or coordination issues.  Endocrine:  Diabetes.  No thyroid issues, hot flashes or night sweats. Psych:  No mood changes, depression or anxiety. Pain:  No focal pain. Review of systems:  All other systems reviewed and found to be negative.  Physical Exam: Blood pressure (!) 168/63, pulse (!) 56, temperature 97.9 F (36.6 C), temperature source Tympanic, height 5' 11" (1.803 m), weight 215 lb 11.2 oz (97.8 kg). GENERAL:  Elderly gentleman sitting comfortably in the exam room in no acute distress.  He has a cane at his side. MENTAL STATUS:  Alert and oriented to person, place and time. HEAD:  Scant gray hair.  Male pattern baldness.  Normocephalic, atraumatic, face symmetric, no Cushingoid features. EYES: Blue eyes.  Pupils equal round and reactive to light and accomodation.  No conjunctivitis or scleral icterus. ENT:  Hearing aide.  Oropharynx clear without lesion.  Tongue normal.  Mucous membranes moist.  RESPIRATORY:  Clear to auscultation without rales, wheezes or rhonchi. CARDIOVASCULAR:  Regular rate and rhythm without murmur, rub or gallop. ABDOMEN:  Soft, non-tender, with active bowel sounds, and no appreciable hepatosplenomegaly.  No masses. SKIN:  No rashes, ulcers or lesions. EXTREMITIES:  Right lower extremity brace  (AFO).  1+ left ankle edema.  No skin discoloration or tenderness.  No palpable cords. LYMPH NODES: No palpable cervical, supraclavicular, axillary or inguinal adenopathy  NEUROLOGICAL:  Appropriate. PSYCH:  Appropriate.   Infusion on 03/12/2017  Component Date Value Ref Range Status  . WBC 03/12/2017 7.8  3.8 - 10.6 K/uL Final  . RBC 03/12/2017 4.14* 4.40 - 5.90 MIL/uL Final  . Hemoglobin 03/12/2017 12.6* 13.0 - 18.0 g/dL Final  . HCT 03/12/2017 37.0* 40.0 - 52.0 % Final  . MCV 03/12/2017 89.5  80.0 - 100.0 fL Final  . MCH 03/12/2017 30.3  26.0 - 34.0 pg Final  . MCHC 03/12/2017 33.9  32.0 - 36.0 g/dL Final  . RDW 03/12/2017 15.0* 11.5 - 14.5 % Final  . Platelets 03/12/2017 145* 150 - 440 K/uL Final  . Neutrophils Relative % 03/12/2017 63  %  Final  . Neutro Abs 03/12/2017 4.8  1.4 - 6.5 K/uL Final  . Lymphocytes Relative 03/12/2017 23  % Final  . Lymphs Abs 03/12/2017 1.8  1.0 - 3.6 K/uL Final  . Monocytes Relative 03/12/2017 9  % Final  . Monocytes Absolute 03/12/2017 0.7  0.2 - 1.0 K/uL Final  . Eosinophils Relative 03/12/2017 4  % Final  . Eosinophils Absolute 03/12/2017 0.3  0 - 0.7 K/uL Final  . Basophils Relative 03/12/2017 1  % Final  . Basophils Absolute 03/12/2017 0.1  0 - 0.1 K/uL Final   Performed at [facility], [address] [place], [place] [address]  . Sodium 03/12/2017 137  135 - 145 mmol/L Final  . Potassium 03/12/2017 4.7  3.5 - 5.1 mmol/L Final  . Chloride 03/12/2017 103  101 - 111 mmol/L Final  . CO2 03/12/2017 28  22 - 32 mmol/L Final  . Glucose, Bld 03/12/2017 99  65 - 99 mg/dL Final  . BUN 03/12/2017 31* 6 - 20 mg/dL Final  . Creatinine, Ser 03/12/2017 1.49* 0.61 - 1.24 mg/dL Final  . Calcium 03/12/2017 9.3  8.9 - 10.3 mg/dL Final  . Total Protein 03/12/2017 6.5  6.5 - 8.1 g/dL Final  . Albumin 03/12/2017 3.7  3.5 - 5.0 g/dL Final  . AST 03/12/2017 20  15 - 41 U/L Final  . ALT 03/12/2017 31  17 - 63 U/L Final  . Alkaline Phosphatase 03/12/2017  48  38 - 126 U/L Final  . Total Bilirubin 03/12/2017 0.6  0.3 - 1.2 mg/dL Final  . GFR calc non Af Amer 03/12/2017 42* >60 mL/min Final  . GFR calc Af Amer 03/12/2017 48* >60 mL/min Final   Comment: (NOTE) The eGFR has been calculated using the CKD EPI equation. This calculation has not been validated in all clinical situations. eGFR's persistently <60 mL/min signify possible Chronic Kidney Disease.   Georgiann Hahn gap 03/12/2017 6  5 - 15 Final   Performed at [facility], [address] [place], [place] [address]  . LDH 03/12/2017 144  98 - 192 U/L Final   Performed at [facility], [address] [place], [place] [address]  . Uric Acid, Serum 03/12/2017 6.9  4.4 - 7.6 mg/dL Final   Performed at [facility], [address] [place], [place] [address]    Assessment:  Hayden Martin is a 84 y.o. 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  He presented with a 30-35 pound weight loss in 2-3 months.  CT-guided retroperitoneal lymph node biopsy on 01/21/2016 revealed a CD30 positive large B-cell lymphoma. Immunohistochemical studies were positive for CD20, CD30, BCL-2, BCL 6, Ki-67 (> 90%) and negative for CD10 and cyclin D1.  Bone marrow biopsy on 01/21/2016 revealed no evidence of lymphoma.  Flow cytometry was negative.  Cytogenetics were normal (46, XY).  Abdomen and pelvic CT scan on 01/07/2016 revealed multiple solid masses within the spleen.  There was extensive retroperitoneal, portal and mesenteric lymphadenopathy.  Retrocrural node measured 2.1 x 2.0 cm.  Periportal node measured 2.6 x 5.8 cm. Confluent nodal mass is in the retroperitoneum surrounding the great vessels measured 5.6 x 3.8 cmon the left and 5.3 x 2.9 cm on the right.  Left iliac node measured 1.7 x 2.6 cm.  There was possible pleural disease on the left.  The prostate was markedly enlarged.  There was no disease in the liver.  PET scan on 01/16/2016 revealed extensive hypermetabolic  adenopathy within the neck, chest, abdomen and pelvis. The spleen was enlarged with multi focal hypermetabolic splenic lesions There was evidence of transperitoneal spread of tumor within the upper abdomen. There were bilateral pleural effusions.  Hepatitis serologies were negative on 01/16/2016. G6PD testing negative.  Echo on 01/18/2016 revealed an EF of 55-60%.   He received 6 cycles of mini-RCHOP (01/23/2016 - 05/16/2016).  Cycle #1 was complicated by tumor lysis syndrome, leukopenia, and symptomatic pleural effusions.  Diagnostic and 1 liter therapeutic left sided thoracentesis on 02/08/2016 revealed involvement with lymphoma.  He received OnPro Neulasta with cycle #2 - #6.  PET scan on 04/23/2016 revealed an excellent response to treatment. The extensive adenopathy seen on the prior PET-CT showed remarkable improvement.  There was no residual metabolically active lymphoma is identified. There was a moderate left pleural effusion and small right pleural effusion.  PET scan on 06/06/2016 revealed continued improvement and near resolution. There was only minimal retroperitoneal nodularity/adenopathy, much of which likely represents a residuum from previously treated lymphoma.  The residual periaortic lymph node measured 1.2 cm in short axis with maximum SUV 2.2 (formerly 1.4 cm and 3.6, respectively). The minimal residual nodal activity was [place] 3.  He has a history of left lower extremity thrombosis.  He has had 2 clots 1 year apart and is on life long anticoagulation.  Bilateral lower extremity duplex on 01/16/2016 revealed DVT in the left lower extremity involving the calf veins and the left popliteal vein.  IVC filterwas placed on 01/18/2016.  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  There was interval resolution of the left calf vein DVT.  He is on Xarelto.  He  has chronic renal insufficiency (Cr 1.3- 1.4).  Creatinine is 1.27 (CrCl 51 ml/min) today.  Chest CT angiogram on 02/02/2016 revealed no evidence of pulmonary emboli. There were bilateral pleural effusions (moderate size left and small to moderate right).  Symptomatically, he denies any B symptoms.  Exam reveals no adenopathy or hepatosplenomegaly. Creatinine is 1.49 (improved).  Plan: 1.  Labs today:  CBC with diff, CMP, LDH, uric acid. 2.  Continue Xarelto.   3.  Port flush every 6 - 8 weeks. 4.  RTC in 3 months for MD assessment, labs (CBC with diff, CMP, LDH, uric acid).   Lequita Asal, MD  03/12/2017, 4:35 PM

## 2017-03-12 NOTE — Progress Notes (Signed)
See follow up. 

## 2017-04-30 ENCOUNTER — Inpatient Hospital Stay: Payer: Medicare Other | Attending: Hematology and Oncology

## 2017-04-30 DIAGNOSIS — C8338 Diffuse large B-cell lymphoma, lymph nodes of multiple sites: Secondary | ICD-10-CM | POA: Insufficient documentation

## 2017-04-30 DIAGNOSIS — Z452 Encounter for adjustment and management of vascular access device: Secondary | ICD-10-CM | POA: Diagnosis not present

## 2017-04-30 DIAGNOSIS — Z95828 Presence of other vascular implants and grafts: Secondary | ICD-10-CM

## 2017-04-30 MED ORDER — SODIUM CHLORIDE 0.9% FLUSH
10.0000 mL | Freq: Once | INTRAVENOUS | Status: AC
  Administered 2017-04-30: 10 mL via INTRAVENOUS
  Filled 2017-04-30: qty 10

## 2017-04-30 MED ORDER — HEPARIN SOD (PORK) LOCK FLUSH 100 UNIT/ML IV SOLN
500.0000 [IU] | Freq: Once | INTRAVENOUS | Status: AC
  Administered 2017-04-30: 500 [IU] via INTRAVENOUS

## 2017-06-18 ENCOUNTER — Ambulatory Visit: Payer: Medicare Other | Admitting: Hematology and Oncology

## 2017-06-18 ENCOUNTER — Inpatient Hospital Stay: Payer: Medicare Other

## 2017-06-22 ENCOUNTER — Inpatient Hospital Stay (HOSPITAL_BASED_OUTPATIENT_CLINIC_OR_DEPARTMENT_OTHER): Payer: Medicare Other | Admitting: Urgent Care

## 2017-06-22 ENCOUNTER — Inpatient Hospital Stay: Payer: Medicare Other | Attending: Hematology and Oncology

## 2017-06-22 VITALS — BP 152/74 | Temp 98.2°F | Resp 18 | Wt 216.1 lb

## 2017-06-22 DIAGNOSIS — Z95828 Presence of other vascular implants and grafts: Secondary | ICD-10-CM

## 2017-06-22 DIAGNOSIS — Z452 Encounter for adjustment and management of vascular access device: Secondary | ICD-10-CM | POA: Diagnosis not present

## 2017-06-22 DIAGNOSIS — E1122 Type 2 diabetes mellitus with diabetic chronic kidney disease: Secondary | ICD-10-CM | POA: Diagnosis not present

## 2017-06-22 DIAGNOSIS — C8338 Diffuse large B-cell lymphoma, lymph nodes of multiple sites: Secondary | ICD-10-CM

## 2017-06-22 DIAGNOSIS — C8333 Diffuse large B-cell lymphoma, intra-abdominal lymph nodes: Secondary | ICD-10-CM

## 2017-06-22 DIAGNOSIS — N400 Enlarged prostate without lower urinary tract symptoms: Secondary | ICD-10-CM | POA: Insufficient documentation

## 2017-06-22 DIAGNOSIS — N189 Chronic kidney disease, unspecified: Secondary | ICD-10-CM | POA: Insufficient documentation

## 2017-06-22 DIAGNOSIS — Z794 Long term (current) use of insulin: Secondary | ICD-10-CM | POA: Insufficient documentation

## 2017-06-22 DIAGNOSIS — Z7982 Long term (current) use of aspirin: Secondary | ICD-10-CM

## 2017-06-22 DIAGNOSIS — I129 Hypertensive chronic kidney disease with stage 1 through stage 4 chronic kidney disease, or unspecified chronic kidney disease: Secondary | ICD-10-CM | POA: Insufficient documentation

## 2017-06-22 DIAGNOSIS — E790 Hyperuricemia without signs of inflammatory arthritis and tophaceous disease: Secondary | ICD-10-CM

## 2017-06-22 DIAGNOSIS — Z7901 Long term (current) use of anticoagulants: Secondary | ICD-10-CM

## 2017-06-22 DIAGNOSIS — Z79899 Other long term (current) drug therapy: Secondary | ICD-10-CM | POA: Insufficient documentation

## 2017-06-22 DIAGNOSIS — Z87891 Personal history of nicotine dependence: Secondary | ICD-10-CM | POA: Insufficient documentation

## 2017-06-22 DIAGNOSIS — Z880 Allergy status to penicillin: Secondary | ICD-10-CM | POA: Diagnosis not present

## 2017-06-22 LAB — COMPREHENSIVE METABOLIC PANEL
ALT: 47 U/L (ref 17–63)
AST: 24 U/L (ref 15–41)
Albumin: 3.7 g/dL (ref 3.5–5.0)
Alkaline Phosphatase: 46 U/L (ref 38–126)
Anion gap: 8 (ref 5–15)
BUN: 36 mg/dL — ABNORMAL HIGH (ref 6–20)
CO2: 25 mmol/L (ref 22–32)
Calcium: 8.9 mg/dL (ref 8.9–10.3)
Chloride: 105 mmol/L (ref 101–111)
Creatinine, Ser: 1.94 mg/dL — ABNORMAL HIGH (ref 0.61–1.24)
GFR calc Af Amer: 35 mL/min — ABNORMAL LOW (ref >60)
GFR calc non Af Amer: 30 mL/min — ABNORMAL LOW (ref >60)
Glucose, Bld: 187 mg/dL — ABNORMAL HIGH (ref 65–99)
Potassium: 4.2 mmol/L (ref 3.5–5.1)
Sodium: 138 mmol/L (ref 135–145)
Total Bilirubin: 0.6 mg/dL (ref 0.3–1.2)
Total Protein: 6.3 g/dL — ABNORMAL LOW (ref 6.5–8.1)

## 2017-06-22 LAB — CBC WITH DIFFERENTIAL/PLATELET
Basophils Absolute: 0.1 10*3/uL (ref 0–0.1)
Basophils Relative: 1 %
Eosinophils Absolute: 0.3 10*3/uL (ref 0–0.7)
Eosinophils Relative: 4 %
HCT: 36.1 % — ABNORMAL LOW (ref 40.0–52.0)
Hemoglobin: 12.4 g/dL — ABNORMAL LOW (ref 13.0–18.0)
Lymphocytes Relative: 23 %
Lymphs Abs: 1.7 10*3/uL (ref 1.0–3.6)
MCH: 30.2 pg (ref 26.0–34.0)
MCHC: 34.4 g/dL (ref 32.0–36.0)
MCV: 88 fL (ref 80.0–100.0)
Monocytes Absolute: 0.6 10*3/uL (ref 0.2–1.0)
Monocytes Relative: 8 %
Neutro Abs: 4.8 10*3/uL (ref 1.4–6.5)
Neutrophils Relative %: 64 %
Platelets: 153 10*3/uL (ref 150–440)
RBC: 4.11 MIL/uL — ABNORMAL LOW (ref 4.40–5.90)
RDW: 15.9 % — ABNORMAL HIGH (ref 11.5–14.5)
WBC: 7.6 10*3/uL (ref 3.8–10.6)

## 2017-06-22 LAB — URIC ACID: Uric Acid, Serum: 7.8 mg/dL — ABNORMAL HIGH (ref 4.4–7.6)

## 2017-06-22 LAB — LACTATE DEHYDROGENASE: LDH: 142 U/L (ref 98–192)

## 2017-06-22 MED ORDER — SODIUM CHLORIDE 0.9% FLUSH
10.0000 mL | INTRAVENOUS | Status: AC | PRN
  Administered 2017-06-22: 10 mL
  Filled 2017-06-22: qty 10

## 2017-06-22 MED ORDER — HEPARIN SOD (PORK) LOCK FLUSH 100 UNIT/ML IV SOLN
500.0000 [IU] | INTRAVENOUS | Status: AC | PRN
  Administered 2017-06-22: 500 [IU]

## 2017-06-22 NOTE — Progress Notes (Signed)
Patient offers no complaints. 

## 2017-06-22 NOTE — Progress Notes (Addendum)
[facility] day:  06/22/2017   Chief Complaint: Hayden Martin is a 84 y.o. 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   HPI:  The patient was last seen by me in the medical oncology clinic on 03/12/2017.  At that time, patient was complaining of issues with his blood pressure.  He denied any bruising, bleeding, or areas of new adenopathy.  Exam was stable.  Creatinine improved to 1.49.  Patient was seen by his PCP on 05/08/2017 for evaluation of a prolonged upper respiratory tract infection.  He was treated with a 5-day course of Azithromycin.  In the interim, patient has been doing well.  He denies any acute physical concerns. Patient denies bleeding; no hematochezia, melena, or gross hematuria.  Patient has multiple areas of bruising noted.  Of note, patient continues on anticoagulation therapy.  Patient denies B symptoms.  There  no new areas of appreciable adenopathy noted.  He denies recurrent infections.  Blood pressure continues to be elevated in the clinic today; recorded at 152/74.  Patient notes a recent change in his antihypertensives.  He follows up with his PCP on a regular basis.  Patient is eating well.  His weight has increased by 1 pound.  Patient denies any pain in the clinic today.  Past Medical History:  Diagnosis Date  . Cancer ([place])   . Clotting disorder ([place])   . Diabetes mellitus without complication ([place])   . Heart disease   . Hypertension     Past Surgical History:  Procedure Laterality Date  . HERNIA REPAIR    . PERIPHERAL VASCULAR CATHETERIZATION N/A 01/18/2016   Procedure: IVC Filter Insertion;  Surgeon: Katha Cabal, MD;  Location: [facility];  Service: Cardiovascular;  Laterality: N/A;  . PERIPHERAL VASCULAR CATHETERIZATION N/A 01/21/2016   Procedure: Glori Luis Cath Insertion;  Surgeon: Algernon Huxley, MD;  Location: [facility];  Service:  Cardiovascular;  Laterality: N/A;    No family history on file.  Social History:  reports that he quit smoking about 31 years ago. He smoked 1.50 packs per day. He has quit using smokeless tobacco. His alcohol and drug histories are not on file.  He was in the [employer].  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  He was discharged from [facility] on 02/20/2016.  He has 3 children who live locally.  He recently bought a new car [facility]).  He describes recently being a passenger in a [employer] airplane.  The patient is alone today.  Allergies:  Allergies  Allergen Reactions  . Codeine Itching  . Penicillins Itching    Current Medications: Current Outpatient Medications  Medication Sig Dispense Refill  . albuterol (PROVENTIL HFA) 108 (90 Base) MCG/ACT inhaler Inhale 2 puffs into the lungs every 4 (four) hours as needed for wheezing or shortness of breath. 1 Inhaler 0  . allopurinol (ZYLOPRIM) 300 MG tablet TAKE ONE (1) TABLET EACH DAY 30 tablet 2  . aspirin EC 81 MG tablet Take 81 mg by mouth daily.    [name] atorvastatin (LIPITOR) 40 MG tablet Take 20 mg by mouth daily.    . carvedilol (COREG) 6.25 MG tablet TAKE ONE (1) TABLET BY MOUTH TWO (2) TIMES DAILY    . Cholecalciferol (VITAMIN D3) 10000 units TABS Take by mouth.    . dronabinol (MARINOL) 2.5 MG  capsule Take 1 capsule (2.5 mg total) by mouth 2 (two) times daily before lunch and supper. 60 capsule 0  . furosemide (LASIX) 20 MG tablet Take 20 mg by mouth.    . Insulin Glargine (LANTUS [place]) Inject into the skin. Inject 35 units subcutaneously at bedtime.    . Insulin NPH Isophane & Regular (NOVOLIN 70/30 [place]) Inject 8 Units into the skin 3 (three) times daily.    [name] ketoconazole (NIZORAL) 2 % cream APPLY AT BEDTIME DAILY TO AFFECTED AREAS    . lidocaine-prilocaine (EMLA) cream Apply 1 application topically as needed. 30 g 3  . ondansetron (ZOFRAN) 8 MG tablet Take 1 tablet (8 mg total) by mouth 2 (two) times daily  as needed for nausea or vomiting. 30 tablet 0  . rivaroxaban (XARELTO) 20 MG TABS tablet Take 20 mg by mouth daily with supper.    [name] amLODipine (NORVASC) 5 MG tablet Take 5 mg by mouth daily.     No current facility-administered medications for this visit.    Facility-Administered Medications Ordered in Other Visits  Medication Dose Route Frequency Provider Last Rate Last Dose  . sodium chloride flush (NS) 0.9 % injection 10 mL  10 mL Intravenous PRN Cammie Sickle, MD   10 mL at 03/10/16 2947    Review of Systems:  GENERAL:  Feels "good".  No fevers or sweats.  Weight up 1 pound. PERFORMANCE STATUS (ECOG):  1 HEENT:  No visual changes, runny nose, sore throat, mouth sores or tenderness. Lungs: No shortness of breath or cough.  No hemoptysis. Cardiac:  Blood pressure issues (see HPI).  No chest pain, palpitations, orthopnea, or PND. GI:  Appetite good.  No vomiting, diarrhea, constipation, or hematochezia. GU:  No urgency, frequency, dysuria, or hematuria. Musculoskeletal:  Back issues (chronic) s/p acupuncture.  No muscle tenderness. Extremities:  No pain or swelling. Skin:  No rashes or skin changes. Neuro:  Right foot drop with brace (AFO), improved.  No headache, numbness or weakness, or coordination issues.  Endocrine:  Diabetes.  No thyroid issues, hot flashes or night sweats. Psych:  No mood changes, depression or anxiety. Pain:  No focal pain. Review of systems:  All other systems reviewed and found to be negative.  Physical Exam: Blood pressure (!) 152/74, temperature 98.2 F (36.8 C), temperature source Tympanic, resp. rate 18, weight 216 lb 1 oz (98 kg). GENERAL:  Elderly gentleman sitting comfortably in the exam room in no acute distress.  He has a cane at his side. MENTAL STATUS:  Alert and oriented to person, place and time. HEAD:  Scant Jayln Branscom hair.  Male pattern baldness.  Normocephalic, atraumatic, face symmetric, no Cushingoid features. EYES: Blue eyes.   Pupils equal round and reactive to light and accomodation.  No conjunctivitis or scleral icterus. ENT:  Hearing aide.  Oropharynx clear without lesion.  Tongue normal.  Mucous membranes moist.  RESPIRATORY:  Clear to auscultation without rales, wheezes or rhonchi. CARDIOVASCULAR:  Regular rate and rhythm without murmur, rub or gallop. ABDOMEN:  Soft, non-tender, with active bowel sounds, and no appreciable hepatosplenomegaly.  No masses. SKIN:  No rashes, ulcers or lesions. EXTREMITIES:  Right lower extremity brace (AFO).  1+ left ankle edema.  No skin discoloration or tenderness.  No palpable cords. LYMPH NODES: No palpable cervical, supraclavicular, axillary or inguinal adenopathy  NEUROLOGICAL:  Appropriate. PSYCH:  Appropriate.   Infusion on 06/22/2017  Component Date Value Ref Range Status  . Uric Acid, Serum 06/22/2017 7.8* 4.4 -  7.6 mg/dL Final   Performed at [facility], [address] [place], [place] [address]  . LDH 06/22/2017 142  98 - 192 U/L Final   Performed at [facility], [address] [place], [place] [address]  . Sodium 06/22/2017 138  135 - 145 mmol/L Final  . Potassium 06/22/2017 4.2  3.5 - 5.1 mmol/L Final  . Chloride 06/22/2017 105  101 - 111 mmol/L Final  . CO2 06/22/2017 25  22 - 32 mmol/L Final  . Glucose, Bld 06/22/2017 187* 65 - 99 mg/dL Final  . BUN 06/22/2017 36* 6 - 20 mg/dL Final  . Creatinine, Ser 06/22/2017 1.94* 0.61 - 1.24 mg/dL Final  . Calcium 06/22/2017 8.9  8.9 - 10.3 mg/dL Final  . Total Protein 06/22/2017 6.3* 6.5 - 8.1 g/dL Final  . Albumin 06/22/2017 3.7  3.5 - 5.0 g/dL Final  . AST 06/22/2017 24  15 - 41 U/L Final  . ALT 06/22/2017 47  17 - 63 U/L Final  . Alkaline Phosphatase 06/22/2017 46  38 - 126 U/L Final  . Total Bilirubin 06/22/2017 0.6  0.3 - 1.2 mg/dL Final  . GFR calc non Af Amer 06/22/2017 30* >60 mL/min Final  . GFR calc Af Amer 06/22/2017 35* >60 mL/min Final   Comment: (NOTE) The eGFR has been  calculated using the CKD EPI equation. This calculation has not been validated in all clinical situations. eGFR's persistently <60 mL/min signify possible Chronic Kidney Disease.   Georgiann Hahn gap 06/22/2017 8  5 - 15 Final   Performed at [facility], [address] [place],  [address]  . WBC 06/22/2017 7.6  3.8 - 10.6 K/uL Final  . RBC 06/22/2017 4.11* 4.40 - 5.90 MIL/uL Final  . Hemoglobin 06/22/2017 12.4* 13.0 - 18.0 g/dL Final  . HCT 06/22/2017 36.1* 40.0 - 52.0 % Final  . MCV 06/22/2017 88.0  80.0 - 100.0 fL Final  . MCH 06/22/2017 30.2  26.0 - 34.0 pg Final  . MCHC 06/22/2017 34.4  32.0 - 36.0 g/dL Final  . RDW 06/22/2017 15.9* 11.5 - 14.5 % Final  . Platelets 06/22/2017 153  150 - 440 K/uL Final  . Neutrophils Relative % 06/22/2017 64  % Final  . Neutro Abs 06/22/2017 4.8  1.4 - 6.5 K/uL Final  . Lymphocytes Relative 06/22/2017 23  % Final  . Lymphs Abs 06/22/2017 1.7  1.0 - 3.6 K/uL Final  . Monocytes Relative 06/22/2017 8  % Final  . Monocytes Absolute 06/22/2017 0.6  0.2 - 1.0 K/uL Final  . Eosinophils Relative 06/22/2017 4  % Final  . Eosinophils Absolute 06/22/2017 0.3  0 - 0.7 K/uL Final  . Basophils Relative 06/22/2017 1  % Final  . Basophils Absolute 06/22/2017 0.1  0 - 0.1 K/uL Final   Performed at [facility], [address] [place],  [address]    Assessment:  Hayden Martin is a 84 y.o. 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  He presented with a 30-35 pound weight loss in 2-3 months.  CT-guided retroperitoneal lymph node biopsy on 01/21/2016 revealed a CD30 positive large B-cell lymphoma. Immunohistochemical studies were positive for CD20, CD30, BCL-2, BCL 6, Ki-67 (> 90%) and negative for CD10 and cyclin D1.  Bone marrow biopsy on 01/21/2016 revealed no evidence of lymphoma.  Flow cytometry was negative.  Cytogenetics were normal (46, XY).  Abdomen and pelvic CT scan on 01/07/2016 revealed multiple solid masses within  the  spleen.  There was extensive retroperitoneal, portal and mesenteric lymphadenopathy.  Retrocrural node measured 2.1 x 2.0 cm.  Periportal node measured 2.6 x 5.8 cm. Confluent nodal mass is in the retroperitoneum surrounding the great vessels measured 5.6 x 3.8 cmon the left and 5.3 x 2.9 cm on the right.  Left iliac node measured 1.7 x 2.6 cm.  There was possible pleural disease on the left.  The prostate was markedly enlarged.  There was no disease in the liver.  PET scan on 01/16/2016 revealed extensive hypermetabolic adenopathy within the neck, chest, abdomen and pelvis. The spleen was enlarged with multi focal hypermetabolic splenic lesions There was evidence of transperitoneal spread of tumor within the upper abdomen. There were bilateral pleural effusions.  Hepatitis serologies were negative on 01/16/2016. G6PD testing negative.  Echo on 01/18/2016 revealed an EF of 55-60%.   He received 6 cycles of mini-RCHOP (01/23/2016 - 05/16/2016).  Cycle #1 was complicated by tumor lysis syndrome, leukopenia, and symptomatic pleural effusions.  Diagnostic and 1 liter therapeutic left sided thoracentesis on 02/08/2016 revealed involvement with lymphoma.  He received OnPro Neulasta with cycle #2 - #6.  PET scan on 04/23/2016 revealed an excellent response to treatment. The extensive adenopathy seen on the prior PET-CT showed remarkable improvement.  There was no residual metabolically active lymphoma is identified. There was a moderate left pleural effusion and small right pleural effusion.  PET scan on 06/06/2016 revealed continued improvement and near resolution. There was only minimal retroperitoneal nodularity/adenopathy, much of which likely represents a residuum from previously treated lymphoma.  The residual periaortic lymph node measured 1.2 cm in short axis with maximum SUV 2.2 (formerly 1.4 cm and 3.6, respectively). The minimal residual nodal activity was Deau[place] 3.  He has a history  of left lower extremity thrombosis.  He has had 2 clots 1 year apart and is on life long anticoagulation.  Bilateral lower extremity duplex on 01/16/2016 revealed DVT in the left lower extremity involving the calf veins and the left popliteal vein.  IVC filterwas placed on 01/18/2016.  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  There was interval resolution of the left calf vein DVT.  He is on Xarelto.  He has chronic renal insufficiency (Cr 1.3- 1.94).   Chest CT angiogram on 02/02/2016 revealed no evidence of pulmonary emboli. There were bilateral pleural effusions (moderate size left and small to moderate right).  Symptomatically, patient is doing well.  He denies any acute physical concerns today.  He denies any B symptoms.  Exam reveals no adenopathy or hepatosplenomegaly. Creatinine is 1.94. Uric acid elevated at 7.8.  Plan: 1.  Labs today:  CBC with diff, CMP, LDH, uric acid. 2.  Continue Xarelto as previously prescribed.  3.  Continue port flushes every 6 - 8 weeks. 4.  RTC in 3 months for MD assessment, labs (CBC with diff, CMP, LDH, uric acid).  Addendum: Uric acid level returned elevated at 7.8. Patient's medication list indicates that he is on allopurinol 300 mg daily, however his last prescription was sent in on 07/04/2016 for #30 with 2 refills.  Clinic nurse called patient and learned that he has not been taking this medication. Given his decreased renal function (CrCl 33.8 mL/min), I will start him out at 100 mg daily dose, with plans to recheck labs. Rx for allopurinol 100 mg daily (disp #30 r3) sent to patient's pharmacy.   Gaspar Bidding  Pearline Cables, NP  06/22/17, 7:55 AM

## 2017-06-23 ENCOUNTER — Telehealth: Payer: Self-pay | Admitting: *Deleted

## 2017-06-23 DIAGNOSIS — Z452 Encounter for adjustment and management of vascular access device: Secondary | ICD-10-CM | POA: Insufficient documentation

## 2017-06-23 DIAGNOSIS — E790 Hyperuricemia without signs of inflammatory arthritis and tophaceous disease: Secondary | ICD-10-CM | POA: Insufficient documentation

## 2017-06-23 MED ORDER — ALLOPURINOL 100 MG PO TABS: 100.0000 mg | ORAL_TABLET | Freq: Every day | ORAL | 3 refills | Status: DC

## 2017-06-23 NOTE — Telephone Encounter (Signed)
-----   Message from Karen Kitchens, NP sent at 06/23/2017  2:01 PM EDT ----- Regarding: Is he taking? Uric acid elevated at 7.8. He has Allopurinol 300 mg daily on his list. Last Rx was a year ago. Is he taking this medication? Perhaps getting it from PCP?  Hayden Martin

## 2017-06-23 NOTE — Telephone Encounter (Signed)
Called patient to inquire if he is taking allopurinol as indicated in his chart - 300 mg daily.  Patient states he is not.  Uric acid level elevated.  New script to be called to [facility] in [place].  Patient verbalized understanding.

## 2017-06-23 NOTE — Addendum Note (Signed)
Addended by: Shirlean Kelly on: 06/23/2017 02:42 PM   Modules accepted: Orders

## 2017-08-03 ENCOUNTER — Inpatient Hospital Stay: Payer: Medicare Other | Attending: Hematology and Oncology

## 2017-08-03 DIAGNOSIS — C8338 Diffuse large B-cell lymphoma, lymph nodes of multiple sites: Secondary | ICD-10-CM | POA: Insufficient documentation

## 2017-08-03 DIAGNOSIS — Z452 Encounter for adjustment and management of vascular access device: Secondary | ICD-10-CM | POA: Insufficient documentation

## 2017-08-03 DIAGNOSIS — C8333 Diffuse large B-cell lymphoma, intra-abdominal lymph nodes: Secondary | ICD-10-CM

## 2017-08-03 MED ORDER — SODIUM CHLORIDE 0.9% FLUSH
10.0000 mL | Freq: Once | INTRAVENOUS | Status: AC
  Administered 2017-08-03: 10 mL via INTRAVENOUS
  Filled 2017-08-03: qty 10

## 2017-08-03 MED ORDER — HEPARIN SOD (PORK) LOCK FLUSH 100 UNIT/ML IV SOLN
500.0000 [IU] | Freq: Once | INTRAVENOUS | Status: AC
  Administered 2017-08-03: 500 [IU] via INTRAVENOUS

## 2017-09-14 NOTE — Progress Notes (Signed)
[facility] day:  09/15/17   Chief Complaint: Hayden Martin is a 84 y.o. 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   HPI:  The patient was last seen in the medical oncology clinic on 06/22/2017.  At that time, patient was doing well. He verbalized no acute complaints. Increased bruising noted on Xarelto. Blood pressure elevated at 152/74; medications had recently been changed. Exam revealed no adenopathy or hepatosplenomegaly. Creatinine was 1.94. Uric acid elevated at 7.8.  Patient was contacted on 06/23/2017 following review of his clinic labs. Patient with an elevated uric acid of 7.8. We learned that he had not been taking his prescribed Allopurinol. Due to reduced renal function (CrCl 33.8 mL/min), patient was restarted at 100 mg daily dose, with plans to recheck labs.  In the interim, patient has been doing well overall.  Patient has had swelling in his lower extremities for the last 3 to 4 months, however for the last month his left leg has been swelling more.  Patient complains of a cramping sensation in his left leg at night.  Of note, this is the extremity that patient has had h previous DVTs. patient denies chest pain, shortness of breath, and palpitations.  There is no claudication pain noted in either of his lower extremities.  Patient has a IVC filter in place, and is on chronic anticoagulation (rivaroxaban).  Patient denies that he has experienced any B symptoms. He denies any interval infections.  Patient denies any new areas of palpable adenopathy.  He has not experienced any increased bruising or bleeding. Patient advises that he maintains an adequate appetite. He is eating well. Weight today is 221 lb 3 oz (100.3 kg), which compared to his last visit to the clinic, represents a 5 pound increase.   Patient denies pain in the clinic today.  Past Medical History:  Diagnosis Date   . Cancer ([place])   . Clotting disorder ([place])   . Diabetes mellitus without complication ([place])   . Heart disease   . Hypertension     Past Surgical History:  Procedure Laterality Date  . HERNIA REPAIR    . PERIPHERAL VASCULAR CATHETERIZATION N/A 01/18/2016   Procedure: IVC Filter Insertion;  Surgeon: Katha Cabal, MD;  Location: [facility];  Service: Cardiovascular;  Laterality: N/A;  . PERIPHERAL VASCULAR CATHETERIZATION N/A 01/21/2016   Procedure: Glori Luis Cath Insertion;  Surgeon: Algernon Huxley, MD;  Location: [facility];  Service: Cardiovascular;  Laterality: N/A;    No family history on file.  Social History:  reports that he quit smoking about 31 years ago. He smoked 1.50 packs per day. He has quit using smokeless tobacco. His alcohol and drug histories are not on file.  He was in the [employer].  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  He was discharged from [facility] on 02/20/2016.  He has 3 children who live locally.  He recently bought a new car [facility]).  He describes recently being a passenger in a [employer] airplane.  The patient is alone today.  Allergies:  Allergies  Allergen Reactions  . Codeine Itching  . Penicillins Itching    Current Medications: Current Outpatient Medications  Medication Sig Dispense Refill  . albuterol (PROVENTIL HFA) 108 (90 Base) MCG/ACT inhaler Inhale 2 puffs into the lungs every 4 (four) hours as needed  for wheezing or shortness of breath. 1 Inhaler 0  . allopurinol (ZYLOPRIM) 100 MG tablet Take 1 tablet (100 mg total) by mouth daily. 30 tablet 3  . amLODipine (NORVASC) 5 MG tablet Take 5 mg by mouth daily.    [name] aspirin EC 81 MG tablet Take 81 mg by mouth daily.    [name] atorvastatin (LIPITOR) 40 MG tablet Take 20 mg by mouth daily.    . carvedilol (COREG) 6.25 MG tablet TAKE ONE (1) TABLET BY MOUTH TWO (2) TIMES DAILY    . Cholecalciferol (VITAMIN D3) 10000 units TABS Take by mouth.    .  dronabinol (MARINOL) 2.5 MG capsule Take 1 capsule (2.5 mg total) by mouth 2 (two) times daily before lunch and supper. 60 capsule 0  . furosemide (LASIX) 20 MG tablet Take 20 mg by mouth.    . Insulin Glargine (LANTUS [place]) Inject into the skin. Inject 35 units subcutaneously at bedtime.    . Insulin NPH Isophane & Regular (NOVOLIN 70/30 [place]) Inject 8 Units into the skin 3 (three) times daily.    [name] ketoconazole (NIZORAL) 2 % cream APPLY AT BEDTIME DAILY TO AFFECTED AREAS    . lidocaine-prilocaine (EMLA) cream Apply 1 application topically as needed. 30 g 3  . ondansetron (ZOFRAN) 8 MG tablet Take 1 tablet (8 mg total) by mouth 2 (two) times daily as needed for nausea or vomiting. 30 tablet 0  . rivaroxaban (XARELTO) 20 MG TABS tablet Take 20 mg by mouth daily with supper.     No current facility-administered medications for this visit.    Facility-Administered Medications Ordered in Other Visits  Medication Dose Route Frequency Provider Last Rate Last Dose  . sodium chloride flush (NS) 0.9 % injection 10 mL  10 mL Intravenous PRN Cammie Sickle, MD   10 mL at 03/10/16 0949    Review of Systems  Constitutional: Negative for diaphoresis, fever, malaise/fatigue and weight loss (weight up 5 pounds).       "I feel good".  More active.  HENT: Positive for hearing loss (hearing aid). Negative for nosebleeds and sore throat.   Eyes: Negative.   Respiratory: Negative for cough, hemoptysis, sputum production and shortness of breath.   Cardiovascular: Positive for leg swelling. Negative for chest pain, palpitations, orthopnea and PND.       Bradycardic at 56.  Murmur  Gastrointestinal: Negative for abdominal pain, blood in stool, constipation, diarrhea, melena, nausea and vomiting.  Genitourinary: Negative for dysuria, frequency, hematuria and urgency.  Musculoskeletal: Positive for back pain (chronic). Negative for falls, joint pain and myalgias.       Nocturnal leg cramping  Skin: Negative  for itching and rash.  Neurological: Negative for dizziness, tremors, weakness and headaches.       RIGHT foot drop - wears AFO.   Endo/Heme/Allergies: Does not bruise/bleed easily.       Diabetes  Psychiatric/Behavioral: Negative for depression, memory loss and suicidal ideas. The patient is not nervous/anxious and does not have insomnia.   All other systems reviewed and are negative.  Performance status (ECOG): 1 - Symptomatic but completely ambulatory  Vital Signs BP (!) 153/70 (BP Location: Left Arm, Patient Position: Sitting)   Pulse (!) 56   Temp 97.8 F (36.6 C) (Tympanic)   Resp 18   Wt 221 lb 3 oz (100.3 kg)   BMI 30.85 kg/m   Physical Exam  Constitutional: He is oriented to person, place, and time and well-developed, well-nourished, and in no distress.  HENT:  Head: Normocephalic and atraumatic.  Grey hair  Eyes: Pupils are equal, round, and reactive to light. EOM are normal. No scleral icterus.  Blue eyes  Neck: Normal range of motion. Neck supple. No tracheal deviation present. No thyromegaly present.  Cardiovascular: Regular rhythm. Bradycardia present. Exam reveals no gallop and no friction rub.  Murmur heard.  Systolic murmur is present with a grade of 2/6. Pulmonary/Chest: Effort normal and breath sounds normal. No respiratory distress. He has no wheezes. He has no rales.  Abdominal: Soft. Bowel sounds are normal. He exhibits no distension. There is no tenderness.  Musculoskeletal: Normal range of motion. He exhibits edema (2+ non-pitting edema to LEFT lower extremity) and deformity (RIGHT foot drop - wears AFO). He exhibits no tenderness.  PMH (+) for DVT. BLE swelling (L>R). No claudication pain with ambulation or dorsiflexion.   Lymphadenopathy:    He has no cervical adenopathy.    He has no axillary adenopathy.       Right: No inguinal and no supraclavicular adenopathy present.       Left: No inguinal and no supraclavicular adenopathy present.   Neurological: He is alert and oriented to person, place, and time.  Skin: Skin is warm and dry. No rash noted. No erythema.  Psychiatric: Mood, affect and judgment normal.  Nursing note and vitals reviewed.   Clinical Support on 09/15/2017  Component Date Value Ref Range Status  . Uric Acid, Serum 09/15/2017 6.5  3.7 - 8.6 mg/dL Final   Comment: Please note change in reference range. Performed at [facility], [address], [place], [place] [address]   . LDH 09/15/2017 137  98 - 192 U/L Final   Performed at [facility], [address] [place], [place] [address]  . Sodium 09/15/2017 138  135 - 145 mmol/L Final  . Potassium 09/15/2017 4.3  3.5 - 5.1 mmol/L Final  . Chloride 09/15/2017 107  98 - 111 mmol/L Final   Please note change in reference range.  . CO2 09/15/2017 23  22 - 32 mmol/L Final  . Glucose, Bld 09/15/2017 209* 70 - 99 mg/dL Final   Please note change in reference range.  . BUN 09/15/2017 34* 8 - 23 mg/dL Final   Please note change in reference range.  . Creatinine, Ser 09/15/2017 1.75* 0.61 - 1.24 mg/dL Final  . Calcium 09/15/2017 8.9  8.9 - 10.3 mg/dL Final  . Total Protein 09/15/2017 6.7  6.5 - 8.1 g/dL Final  . Albumin 09/15/2017 3.8  3.5 - 5.0 g/dL Final  . AST 09/15/2017 23  15 - 41 U/L Final  . ALT 09/15/2017 47* 0 - 44 U/L Final   Please note change in reference range.  . Alkaline Phosphatase 09/15/2017 57  38 - 126 U/L Final  . Total Bilirubin 09/15/2017 0.7  0.3 - 1.2 mg/dL Final  . GFR calc non Af Amer 09/15/2017 34* >60 mL/min Final  . GFR calc Af Amer 09/15/2017 39* >60 mL/min Final   Comment: (NOTE) The eGFR has been calculated using the CKD EPI equation. This calculation has not been validated in all clinical situations. eGFR's persistently <60 mL/min signify possible Chronic Kidney Disease.   Georgiann Hahn gap 09/15/2017 8  5 - 15 Final   Performed at [facility], [address] [place], [place] [address]  . WBC  09/15/2017 8.2  3.8 - 10.6 K/uL Final  . RBC 09/15/2017 4.31* 4.40 - 5.90 MIL/uL Final  . Hemoglobin 09/15/2017 13.0  13.0 - 18.0 g/dL Final  . HCT 09/15/2017 38.4* 40.0 - 52.0 % Final  . MCV 09/15/2017 89.0  80.0 - 100.0 fL Final  . MCH 09/15/2017 30.1  26.0 - 34.0 pg Final  . MCHC 09/15/2017 33.9  32.0 - 36.0 g/dL Final  . RDW 09/15/2017 15.0* 11.5 - 14.5 % Final  . Platelets 09/15/2017 163  150 - 440 K/uL Final  . Neutrophils Relative % 09/15/2017 63  % Final  . Neutro Abs 09/15/2017 5.2  1.4 - 6.5 K/uL Final  . Lymphocytes Relative 09/15/2017 23  % Final  . Lymphs Abs 09/15/2017 1.9  1.0 - 3.6 K/uL Final  . Monocytes Relative 09/15/2017 9  % Final  . Monocytes Absolute 09/15/2017 0.8  0.2 - 1.0 K/uL Final  . Eosinophils Relative 09/15/2017 4  % Final  . Eosinophils Absolute 09/15/2017 0.3  0 - 0.7 K/uL Final  . Basophils Relative 09/15/2017 1  % Final  . Basophils Absolute 09/15/2017 0.1  0 - 0.1 K/uL Final   Performed at [facility], [address] [place], [place] [address]    Assessment:  TAJE LITTLER is a 84 y.o. 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  He presented with a 30-35 pound weight loss in 2-3 months.  CT-guided retroperitoneal lymph node biopsy on 01/21/2016 revealed a CD30 positive large B-cell lymphoma. Immunohistochemical studies were positive for CD20, CD30, BCL-2, BCL 6, Ki-67 (> 90%) and negative for CD10 and cyclin D1.  Bone marrow biopsy on 01/21/2016 revealed no evidence of lymphoma.  Flow cytometry was negative.  Cytogenetics were normal (46, XY).  Abdomen and pelvic CT scan on 01/07/2016 revealed multiple solid masses within the spleen.  There was extensive retroperitoneal, portal and mesenteric lymphadenopathy.  Retrocrural node measured 2.1 x 2.0 cm.  Periportal node measured 2.6 x 5.8 cm. Confluent nodal mass is in the retroperitoneum surrounding the great vessels measured 5.6 x 3.8 cmon the left and 5.3 x 2.9 cm on the right.   Left iliac node measured 1.7 x 2.6 cm.  There was possible pleural disease on the left.  The prostate was markedly enlarged.  There was no disease in the liver.  PET scan on 01/16/2016 revealed extensive hypermetabolic adenopathy within the neck, chest, abdomen and pelvis. The spleen was enlarged with multi focal hypermetabolic splenic lesions There was evidence of transperitoneal spread of tumor within the upper abdomen. There were bilateral pleural effusions.  Hepatitis serologies were negative on 01/16/2016. G6PD testing negative.  Echo on 01/18/2016 revealed an EF of 55-60%.   He received 6 cycles of mini-RCHOP (01/23/2016 - 05/16/2016).  Cycle #1 was complicated by tumor lysis syndrome, leukopenia, and symptomatic pleural effusions.  Diagnostic and 1 liter therapeutic left sided thoracentesis on 02/08/2016 revealed involvement with lymphoma.  He received OnPro Neulasta with cycle #2 - #6.  PET scan on 04/23/2016 revealed an excellent response to treatment. The extensive adenopathy seen on the prior PET-CT showed remarkable improvement.  There was no residual metabolically active lymphoma is identified. There was a moderate left pleural effusion and small right pleural effusion.  PET scan on 06/06/2016 revealed continued improvement and near resolution. There was only minimal retroperitoneal nodularity/adenopathy, much of which likely represents a residuum from previously treated lymphoma.  The residual periaortic lymph node measured 1.2 cm in short axis with maximum SUV 2.2 (formerly 1.4 cm and 3.6, respectively). The minimal residual nodal activity was [place] 3.  He has a history of left lower  extremity thrombosis.  He has had 2 clots 1 year apart and is on life long anticoagulation.  Bilateral lower extremity duplex on 01/16/2016 revealed DVT in the left lower extremity involving the calf veins and the left popliteal vein.  IVC filterwas placed on 01/18/2016.  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  There was interval resolution of the left calf vein DVT.  He is on Xarelto.  He has chronic renal insufficiency (Cr 1.3- 1.94).   Chest CT angiogram on 02/02/2016 revealed no evidence of pulmonary emboli. There were bilateral pleural effusions (moderate size left and small to moderate right).  Symptomatically, patient is doing well. He has no acute complaints.  He notes 3 to 4 months of bilateral lower extremity swelling, with the left being more swollen than the right.  He denies chest pain, shortness of breath, and palpitations.  He remains on chronic anticoagulation therapy (rivaroxaban).  Exam reveals a negative Homans sign.  There are no areas of palpable adenopathy.  No hepatosplenomegaly.  WBC   8200 with an [place] of 5200.  Platelets 163,000.  Post prandial glucose elevated at 209.  BUN 34 and creatinine 1.75.  Uric acid has improved to 6.5.  Plan: 1. Labs today:  CBC with diff, CMP, LDH, uric acid. 2. Discuss increased left lower extremity swelling.  While his exam was reassuring, patient has had previous DVT in this leg. Patient has an IVC filter in place. He denies claudication pain with dorsiflexion of his foot.  Ultrasound imaging was offered, however patient declined citing that he would like to "wait and see".  Discussed negative consequences associated with his decision, however patient remained adamant that imaging was not necessary at this time.  Patient provided with strict return precautions.  He is aware that he should seek medical attention for increased swelling and pain, shortness of breath, and chest pain.  3. Discuss hyperuricemia.  Uric acid has decreased to 6.5 on allopurinol 100 mg daily.  Will continue allopurinol at the current dose. 4. Continue Xarelto as previously prescribed.  5. Continue port flushes every 6 - 8 weeks. 6. RTC in 3  months for MD assessment, labs (CBC with diff, CMP, LDH, uric acid).  Honor Loh, NP  09/15/17, 10:09 PM

## 2017-09-15 ENCOUNTER — Inpatient Hospital Stay (HOSPITAL_BASED_OUTPATIENT_CLINIC_OR_DEPARTMENT_OTHER): Payer: Medicare Other | Admitting: Urgent Care

## 2017-09-15 ENCOUNTER — Inpatient Hospital Stay: Payer: Medicare Other | Attending: Hematology and Oncology

## 2017-09-15 VITALS — BP 153/70 | HR 56 | Temp 97.8°F | Resp 18 | Wt 221.2 lb

## 2017-09-15 DIAGNOSIS — C8333 Diffuse large B-cell lymphoma, intra-abdominal lymph nodes: Secondary | ICD-10-CM

## 2017-09-15 DIAGNOSIS — R001 Bradycardia, unspecified: Secondary | ICD-10-CM

## 2017-09-15 DIAGNOSIS — N189 Chronic kidney disease, unspecified: Secondary | ICD-10-CM | POA: Diagnosis not present

## 2017-09-15 DIAGNOSIS — G4762 Sleep related leg cramps: Secondary | ICD-10-CM

## 2017-09-15 DIAGNOSIS — Z7901 Long term (current) use of anticoagulants: Secondary | ICD-10-CM

## 2017-09-15 DIAGNOSIS — Z7982 Long term (current) use of aspirin: Secondary | ICD-10-CM

## 2017-09-15 DIAGNOSIS — E1122 Type 2 diabetes mellitus with diabetic chronic kidney disease: Secondary | ICD-10-CM | POA: Diagnosis not present

## 2017-09-15 DIAGNOSIS — Z79899 Other long term (current) drug therapy: Secondary | ICD-10-CM | POA: Insufficient documentation

## 2017-09-15 DIAGNOSIS — Z87891 Personal history of nicotine dependence: Secondary | ICD-10-CM

## 2017-09-15 DIAGNOSIS — N183 Chronic kidney disease, stage 3 unspecified: Secondary | ICD-10-CM

## 2017-09-15 DIAGNOSIS — E790 Hyperuricemia without signs of inflammatory arthritis and tophaceous disease: Secondary | ICD-10-CM | POA: Insufficient documentation

## 2017-09-15 DIAGNOSIS — M549 Dorsalgia, unspecified: Secondary | ICD-10-CM | POA: Diagnosis not present

## 2017-09-15 DIAGNOSIS — R600 Localized edema: Secondary | ICD-10-CM | POA: Insufficient documentation

## 2017-09-15 DIAGNOSIS — H9190 Unspecified hearing loss, unspecified ear: Secondary | ICD-10-CM

## 2017-09-15 DIAGNOSIS — I129 Hypertensive chronic kidney disease with stage 1 through stage 4 chronic kidney disease, or unspecified chronic kidney disease: Secondary | ICD-10-CM | POA: Insufficient documentation

## 2017-09-15 DIAGNOSIS — C8338 Diffuse large B-cell lymphoma, lymph nodes of multiple sites: Secondary | ICD-10-CM

## 2017-09-15 DIAGNOSIS — Z794 Long term (current) use of insulin: Secondary | ICD-10-CM

## 2017-09-15 DIAGNOSIS — Z86718 Personal history of other venous thrombosis and embolism: Secondary | ICD-10-CM | POA: Insufficient documentation

## 2017-09-15 DIAGNOSIS — R599 Enlarged lymph nodes, unspecified: Secondary | ICD-10-CM

## 2017-09-15 DIAGNOSIS — I82432 Acute embolism and thrombosis of left popliteal vein: Secondary | ICD-10-CM

## 2017-09-15 DIAGNOSIS — Z452 Encounter for adjustment and management of vascular access device: Secondary | ICD-10-CM

## 2017-09-15 DIAGNOSIS — R591 Generalized enlarged lymph nodes: Secondary | ICD-10-CM

## 2017-09-15 LAB — CBC WITH DIFFERENTIAL/PLATELET
Basophils Absolute: 0.1 10*3/uL (ref 0–0.1)
Basophils Relative: 1 %
Eosinophils Absolute: 0.3 10*3/uL (ref 0–0.7)
Eosinophils Relative: 4 %
HCT: 38.4 % — ABNORMAL LOW (ref 40.0–52.0)
Hemoglobin: 13 g/dL (ref 13.0–18.0)
Lymphocytes Relative: 23 %
Lymphs Abs: 1.9 10*3/uL (ref 1.0–3.6)
MCH: 30.1 pg (ref 26.0–34.0)
MCHC: 33.9 g/dL (ref 32.0–36.0)
MCV: 89 fL (ref 80.0–100.0)
Monocytes Absolute: 0.8 10*3/uL (ref 0.2–1.0)
Monocytes Relative: 9 %
Neutro Abs: 5.2 10*3/uL (ref 1.4–6.5)
Neutrophils Relative %: 63 %
Platelets: 163 10*3/uL (ref 150–440)
RBC: 4.31 MIL/uL — ABNORMAL LOW (ref 4.40–5.90)
RDW: 15 % — ABNORMAL HIGH (ref 11.5–14.5)
WBC: 8.2 10*3/uL (ref 3.8–10.6)

## 2017-09-15 LAB — COMPREHENSIVE METABOLIC PANEL
ALT: 47 U/L — ABNORMAL HIGH (ref 0–44)
AST: 23 U/L (ref 15–41)
Albumin: 3.8 g/dL (ref 3.5–5.0)
Alkaline Phosphatase: 57 U/L (ref 38–126)
Anion gap: 8 (ref 5–15)
BUN: 34 mg/dL — ABNORMAL HIGH (ref 8–23)
CO2: 23 mmol/L (ref 22–32)
Calcium: 8.9 mg/dL (ref 8.9–10.3)
Chloride: 107 mmol/L (ref 98–111)
Creatinine, Ser: 1.75 mg/dL — ABNORMAL HIGH (ref 0.61–1.24)
GFR calc Af Amer: 39 mL/min — ABNORMAL LOW (ref >60)
GFR calc non Af Amer: 34 mL/min — ABNORMAL LOW (ref >60)
Glucose, Bld: 209 mg/dL — ABNORMAL HIGH (ref 70–99)
Potassium: 4.3 mmol/L (ref 3.5–5.1)
Sodium: 138 mmol/L (ref 135–145)
Total Bilirubin: 0.7 mg/dL (ref 0.3–1.2)
Total Protein: 6.7 g/dL (ref 6.5–8.1)

## 2017-09-15 LAB — LACTATE DEHYDROGENASE: LDH: 137 U/L (ref 98–192)

## 2017-09-15 LAB — URIC ACID: Uric Acid, Serum: 6.5 mg/dL (ref 3.7–8.6)

## 2017-09-15 MED ORDER — HEPARIN SOD (PORK) LOCK FLUSH 100 UNIT/ML IV SOLN
500.0000 [IU] | Freq: Once | INTRAVENOUS | Status: AC
  Administered 2017-09-15: 500 [IU] via INTRAVENOUS

## 2017-09-15 MED ORDER — SODIUM CHLORIDE 0.9% FLUSH
10.0000 mL | Freq: Once | INTRAVENOUS | Status: AC
  Administered 2017-09-15: 10 mL via INTRAVENOUS
  Filled 2017-09-15: qty 10

## 2017-09-15 NOTE — Progress Notes (Signed)
Patient c/o lower extremity edema (worse on left than right).  Otherwise, offers no complaints.

## 2017-09-16 ENCOUNTER — Telehealth: Payer: Self-pay | Admitting: *Deleted

## 2017-09-16 MED ORDER — ALLOPURINOL 100 MG PO TABS: 100.0000 mg | ORAL_TABLET | Freq: Every day | ORAL | 3 refills | Status: DC

## 2017-09-16 NOTE — Telephone Encounter (Signed)
-----   Message from Karen Kitchens, NP sent at 09/15/2017  4:27 PM EDT ----- Regarding: Call Uric acid level has improved. It is down to 6.8 on the 100 mg/day dose of Allopurinol. We will not need to increase his dose. Have him remain at the current dose.   As always, if he has issues or concerns prior to his next RTC, he can call the clinic.   Hayden Martin  ----- Message ----- From: Interface, Lab In [place] Sent: 09/15/2017   1:43 PM To: Lequita Asal, MD

## 2017-09-16 NOTE — Telephone Encounter (Signed)
Called patient to inform him that his uric acid level has improved.  Per Gaspar Bidding, NP patient is to continue current dosage of Allopurinol.  Patient states he will need refill called to [facility].  Informed him we will send refill.

## 2017-09-17 DIAGNOSIS — R600 Localized edema: Secondary | ICD-10-CM | POA: Insufficient documentation

## 2017-09-21 ENCOUNTER — Other Ambulatory Visit: Payer: Medicare Other

## 2017-09-21 ENCOUNTER — Ambulatory Visit: Payer: Medicare Other | Admitting: Urgent Care

## 2017-11-09 ENCOUNTER — Inpatient Hospital Stay: Payer: Medicare Other | Attending: Hematology and Oncology

## 2017-11-09 DIAGNOSIS — Z452 Encounter for adjustment and management of vascular access device: Secondary | ICD-10-CM | POA: Insufficient documentation

## 2017-11-09 DIAGNOSIS — Z95828 Presence of other vascular implants and grafts: Secondary | ICD-10-CM

## 2017-11-09 DIAGNOSIS — C8338 Diffuse large B-cell lymphoma, lymph nodes of multiple sites: Secondary | ICD-10-CM | POA: Insufficient documentation

## 2017-11-09 MED ORDER — HEPARIN SOD (PORK) LOCK FLUSH 100 UNIT/ML IV SOLN
500.0000 [IU] | Freq: Once | INTRAVENOUS | Status: AC
  Administered 2017-11-09: 500 [IU] via INTRAVENOUS

## 2017-11-09 MED ORDER — SODIUM CHLORIDE 0.9% FLUSH
10.0000 mL | Freq: Once | INTRAVENOUS | Status: AC
  Administered 2017-11-09: 10 mL via INTRAVENOUS
  Filled 2017-11-09: qty 10

## 2017-11-09 MED ORDER — HEPARIN SOD (PORK) LOCK FLUSH 100 UNIT/ML IV SOLN
INTRAVENOUS | Status: AC
  Filled 2017-11-09: qty 5

## 2017-12-16 ENCOUNTER — Other Ambulatory Visit: Payer: Self-pay | Admitting: *Deleted

## 2017-12-16 DIAGNOSIS — C8338 Diffuse large B-cell lymphoma, lymph nodes of multiple sites: Secondary | ICD-10-CM

## 2017-12-17 ENCOUNTER — Encounter: Payer: Self-pay | Admitting: Hematology and Oncology

## 2017-12-17 ENCOUNTER — Inpatient Hospital Stay (HOSPITAL_BASED_OUTPATIENT_CLINIC_OR_DEPARTMENT_OTHER): Payer: Medicare Other | Admitting: Hematology and Oncology

## 2017-12-17 ENCOUNTER — Inpatient Hospital Stay: Payer: Medicare Other | Attending: Hematology and Oncology

## 2017-12-17 ENCOUNTER — Other Ambulatory Visit: Payer: Self-pay

## 2017-12-17 VITALS — BP 178/79 | HR 56 | Temp 97.2°F | Resp 20 | Ht 71.0 in | Wt 224.0 lb

## 2017-12-17 DIAGNOSIS — R600 Localized edema: Secondary | ICD-10-CM | POA: Diagnosis not present

## 2017-12-17 DIAGNOSIS — E1122 Type 2 diabetes mellitus with diabetic chronic kidney disease: Secondary | ICD-10-CM

## 2017-12-17 DIAGNOSIS — N189 Chronic kidney disease, unspecified: Secondary | ICD-10-CM | POA: Insufficient documentation

## 2017-12-17 DIAGNOSIS — I129 Hypertensive chronic kidney disease with stage 1 through stage 4 chronic kidney disease, or unspecified chronic kidney disease: Secondary | ICD-10-CM | POA: Insufficient documentation

## 2017-12-17 DIAGNOSIS — Z87891 Personal history of nicotine dependence: Secondary | ICD-10-CM

## 2017-12-17 DIAGNOSIS — Z794 Long term (current) use of insulin: Secondary | ICD-10-CM | POA: Insufficient documentation

## 2017-12-17 DIAGNOSIS — R252 Cramp and spasm: Secondary | ICD-10-CM

## 2017-12-17 DIAGNOSIS — E790 Hyperuricemia without signs of inflammatory arthritis and tophaceous disease: Secondary | ICD-10-CM | POA: Diagnosis not present

## 2017-12-17 DIAGNOSIS — I82432 Acute embolism and thrombosis of left popliteal vein: Secondary | ICD-10-CM

## 2017-12-17 DIAGNOSIS — Z86718 Personal history of other venous thrombosis and embolism: Secondary | ICD-10-CM

## 2017-12-17 DIAGNOSIS — Z95828 Presence of other vascular implants and grafts: Secondary | ICD-10-CM

## 2017-12-17 DIAGNOSIS — Z452 Encounter for adjustment and management of vascular access device: Secondary | ICD-10-CM | POA: Insufficient documentation

## 2017-12-17 DIAGNOSIS — R011 Cardiac murmur, unspecified: Secondary | ICD-10-CM | POA: Diagnosis not present

## 2017-12-17 DIAGNOSIS — Z79899 Other long term (current) drug therapy: Secondary | ICD-10-CM

## 2017-12-17 DIAGNOSIS — Z7982 Long term (current) use of aspirin: Secondary | ICD-10-CM | POA: Diagnosis not present

## 2017-12-17 DIAGNOSIS — Z7901 Long term (current) use of anticoagulants: Secondary | ICD-10-CM | POA: Insufficient documentation

## 2017-12-17 DIAGNOSIS — C8338 Diffuse large B-cell lymphoma, lymph nodes of multiple sites: Secondary | ICD-10-CM | POA: Insufficient documentation

## 2017-12-17 LAB — CBC WITH DIFFERENTIAL/PLATELET
Abs Immature Granulocytes: 0.04 10*3/uL (ref 0.00–0.07)
BASOS PCT: 1 %
Basophils Absolute: 0.1 10*3/uL (ref 0.0–0.1)
EOS ABS: 0.3 10*3/uL (ref 0.0–0.5)
Eosinophils Relative: 4 %
HCT: 40 % (ref 39.0–52.0)
Hemoglobin: 13.1 g/dL (ref 13.0–17.0)
Immature Granulocytes: 1 %
Lymphocytes Relative: 22 %
Lymphs Abs: 2 10*3/uL (ref 0.7–4.0)
MCH: 29.2 pg (ref 26.0–34.0)
MCHC: 32.8 g/dL (ref 30.0–36.0)
MCV: 89.1 fL (ref 80.0–100.0)
MONO ABS: 0.7 10*3/uL (ref 0.1–1.0)
Monocytes Relative: 8 %
Neutro Abs: 5.8 10*3/uL (ref 1.7–7.7)
Neutrophils Relative %: 64 %
PLATELETS: 157 10*3/uL (ref 150–400)
RBC: 4.49 MIL/uL (ref 4.22–5.81)
RDW: 14.6 % (ref 11.5–15.5)
WBC: 8.8 10*3/uL (ref 4.0–10.5)
nRBC: 0 % (ref 0.0–0.2)

## 2017-12-17 LAB — COMPREHENSIVE METABOLIC PANEL
ALBUMIN: 3.6 g/dL (ref 3.5–5.0)
ALK PHOS: 54 U/L (ref 38–126)
ALT: 50 U/L — ABNORMAL HIGH (ref 0–44)
AST: 23 U/L (ref 15–41)
Anion gap: 6 (ref 5–15)
BUN: 30 mg/dL — AB (ref 8–23)
CALCIUM: 8.9 mg/dL (ref 8.9–10.3)
CO2: 25 mmol/L (ref 22–32)
Chloride: 106 mmol/L (ref 98–111)
Creatinine, Ser: 1.64 mg/dL — ABNORMAL HIGH (ref 0.61–1.24)
GFR calc Af Amer: 43 mL/min — ABNORMAL LOW (ref >60)
GFR calc non Af Amer: 37 mL/min — ABNORMAL LOW (ref >60)
GLUCOSE: 213 mg/dL — AB (ref 70–99)
Potassium: 4.1 mmol/L (ref 3.5–5.1)
Sodium: 137 mmol/L (ref 135–145)
TOTAL PROTEIN: 6.4 g/dL — AB (ref 6.5–8.1)
Total Bilirubin: 0.6 mg/dL (ref 0.3–1.2)

## 2017-12-17 LAB — URIC ACID: Uric Acid, Serum: 5.5 mg/dL (ref 3.7–8.6)

## 2017-12-17 LAB — LACTATE DEHYDROGENASE: LDH: 149 U/L (ref 98–192)

## 2017-12-17 MED ORDER — HEPARIN SOD (PORK) LOCK FLUSH 100 UNIT/ML IV SOLN
500.0000 [IU] | Freq: Once | INTRAVENOUS | Status: AC
  Administered 2017-12-17: 500 [IU] via INTRAVENOUS

## 2017-12-17 MED ORDER — SODIUM CHLORIDE 0.9% FLUSH
10.0000 mL | Freq: Once | INTRAVENOUS | Status: AC
  Administered 2017-12-17: 10 mL via INTRAVENOUS
  Filled 2017-12-17: qty 10

## 2017-12-17 NOTE — Progress Notes (Signed)
[facility] day:  12/17/17  Chief Complaint: Hayden Martin is a 84 y.o. 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   HPI:  The patient was last seen in the medical oncology clinic on 09/15/2017.  At that time, he was doing well. He had no acute complaints.  He noted 3 to 4 months of bilateral lower extremity swelling (left > right).  He denied chest pain, shortness of breath, and palpitations.  He remained on chronic anticoagulation therapy (rivaroxaban).  Exam revealed a negative Homans sign.  There are no areas of palpable adenopathy or hepatosplenomegaly.  WBC was 8200 with an [place] of 5200.  Platelets were 163,000.  Post prandial glucose was elevated at 209.  BUN was 34 and creatinine 1.75.  Uric acid was 6.5.  Patient declined lower extremity duplex.  During the interim, he has felt "pretty good".  He notes problems with his blood pressure.  He has been going to the [place].  He denies any fevers, sweats or weight loss.  He states that his left ankle swells all of the time.   Past Medical History:  Diagnosis Date  . Cancer ([place])   . Clotting disorder ([place])   . Diabetes mellitus without complication ([place])   . Heart disease   . Hypertension     Past Surgical History:  Procedure Laterality Date  . HERNIA REPAIR    . PERIPHERAL VASCULAR CATHETERIZATION N/A 01/18/2016   Procedure: IVC Filter Insertion;  Surgeon: Katha Cabal, MD;  Location: [facility];  Service: Cardiovascular;  Laterality: N/A;  . PERIPHERAL VASCULAR CATHETERIZATION N/A 01/21/2016   Procedure: Glori Luis Cath Insertion;  Surgeon: Algernon Huxley, MD;  Location: [facility];  Service: Cardiovascular;  Laterality: N/A;  . PORTA CATH REMOVAL N/A 01/07/2018   Procedure: PORTA CATH REMOVAL;  Surgeon: Algernon Huxley, MD;  Location: [facility];  Service: Cardiovascular;  Laterality: N/A;    History  reviewed. No pertinent family history.  Social History:  reports that he quit smoking about 32 years ago. He smoked 1.50 packs per day. He has quit using smokeless tobacco. No history on file for alcohol and drug.  He was in the [employer].  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  He was discharged from [facility] on 02/20/2016.  He has 3 children who live locally.  He recently bought a new car [facility]).  He describes recently being a passenger in a [employer] airplane.  The patient is alone today.  Allergies:  Allergies  Allergen Reactions  . Codeine Itching  . Penicillins Itching    Current Medications: Current Outpatient Medications  Medication Sig Dispense Refill  . allopurinol (ZYLOPRIM) 100 MG tablet Take 1 tablet (100 mg total) by mouth daily. 90 tablet 3  . amLODipine (NORVASC) 5 MG tablet Take 5 mg by mouth daily.    [name] aspirin EC 81 MG tablet Take 81 mg by mouth daily.    [name] atorvastatin (LIPITOR) 40 MG tablet Take 20 mg by mouth daily.    . carvedilol (COREG) 6.25 MG tablet TAKE ONE (1) TABLET BY MOUTH TWO (2) TIMES DAILY    . Cholecalciferol (VITAMIN D3) 10000 units TABS Take by mouth.    . dronabinol (MARINOL) 2.5 MG capsule Take 1 capsule (2.5 mg total) by mouth 2 (two) times daily before  lunch and supper. (Patient not taking: Reported on 01/07/2018) 60 capsule 0  . furosemide (LASIX) 20 MG tablet Take 20 mg by mouth.    . Insulin Glargine (LANTUS [place]) Inject into the skin. Inject 32 units subcutaneously at bedtime.    . Insulin NPH Isophane & Regular (NOVOLIN 70/30 [place]) Inject 6 Units into the skin 3 (three) times daily.     [name] ketoconazole (NIZORAL) 2 % cream APPLY AT BEDTIME DAILY TO AFFECTED AREAS    . rivaroxaban (XARELTO) 20 MG TABS tablet Take 20 mg by mouth daily with supper.    . lidocaine-prilocaine (EMLA) cream Apply 1 application topically as needed. (Patient not taking: Reported on 12/17/2017) 30 g 3  . ondansetron (ZOFRAN) 8 MG tablet Take  1 tablet (8 mg total) by mouth 2 (two) times daily as needed for nausea or vomiting. (Patient not taking: Reported on 12/17/2017) 30 tablet 0   No current facility-administered medications for this visit.    Facility-Administered Medications Ordered in Other Visits  Medication Dose Route Frequency Provider Last Rate Last Dose  . sodium chloride flush (NS) 0.9 % injection 10 mL  10 mL Intravenous PRN Cammie Sickle, MD   10 mL at 03/10/16 0949    Review of Systems  Constitutional: Negative for chills, diaphoresis, fever, malaise/fatigue and weight loss (up 3 pounds).       Feels "pretty good".  Active.  HENT: Positive for hearing loss (hearing aid). Negative for congestion, ear discharge, ear pain, nosebleeds, sinus pain and sore throat.   Eyes: Negative.  Negative for double vision, photophobia, pain, discharge and redness.  Respiratory: Negative.  Negative for cough, hemoptysis, sputum production and shortness of breath.   Cardiovascular: Positive for leg swelling. Negative for chest pain, palpitations, orthopnea and PND.  Gastrointestinal: Negative.  Negative for abdominal pain, blood in stool, constipation, diarrhea, melena, nausea and vomiting.  Genitourinary: Negative.  Negative for dysuria, frequency, hematuria and urgency.  Musculoskeletal: Positive for back pain (chronic). Negative for falls, joint pain, myalgias and neck pain.       PMH (+) for DVT.  Leg cramps at night- chronic.  Skin: Negative.  Negative for itching and rash.  Neurological: Negative for dizziness, tremors, focal weakness, weakness and headaches.  Endo/Heme/Allergies: Bruises/bleeds easily.       Diabetes.  Psychiatric/Behavioral: Negative for depression, memory loss and suicidal ideas. The patient is not nervous/anxious and does not have insomnia.   All other systems reviewed and are negative.  Performance status (ECOG): 1 - Symptomatic but completely ambulatory  Vital Signs BP (!) 178/79   Pulse (!)  56   Temp (!) 97.2 F (36.2 C) (Tympanic)   Resp 20   Ht _0  (1.803 m)   Wt 224 lb (101.6 kg)   BMI 31.24 kg/m   Physical Exam  Constitutional: He is oriented to person, place, and time and well-developed, well-nourished, and in no distress. No distress.  HENT:  Head: Normocephalic and atraumatic.  Mouth/Throat: Oropharynx is clear and moist. No oropharyngeal exudate.  White hair.  Eyes: Pupils are equal, round, and reactive to light. Conjunctivae and EOM are normal. No scleral icterus.  Blue eyes.  Neck: Normal range of motion. Neck supple. No JVD present.  Cardiovascular: Regular rhythm. Bradycardia present. Exam reveals no gallop and no friction rub.  Murmur heard.  Systolic murmur is present with a grade of 2/6. Pulmonary/Chest: Effort normal and breath sounds normal. No respiratory distress. He has no wheezes. He has no rales.  Abdominal: Soft. Bowel sounds are normal. He exhibits no distension and no mass. There is no abdominal tenderness. There is no rebound and no guarding.  Musculoskeletal: Normal range of motion.        General: Edema (left ankle edema) present. No tenderness.  Lymphadenopathy:    He has no cervical adenopathy.    He has no axillary adenopathy.       Right: No inguinal and no supraclavicular adenopathy present.       Left: No inguinal and no supraclavicular adenopathy present.  Neurological: He is alert and oriented to person, place, and time.  Skin: Skin is warm and dry. No rash noted. He is not diaphoretic. No erythema.  Psychiatric: Mood, affect and judgment normal.  Nursing note and vitals reviewed.   Infusion on 12/17/2017  Component Date Value Ref Range Status  . Uric Acid, Serum 12/17/2017 5.5  3.7 - 8.6 mg/dL Final   Performed at [facility], [address] [place], [place] [address]  . LDH 12/17/2017 149  98 - 192 U/L Final   Performed at [facility], [address] [place], [place] [address]  . Sodium 12/17/2017  137  135 - 145 mmol/L Final  . Potassium 12/17/2017 4.1  3.5 - 5.1 mmol/L Final  . Chloride 12/17/2017 106  98 - 111 mmol/L Final  . CO2 12/17/2017 25  22 - 32 mmol/L Final  . Glucose, Bld 12/17/2017 213* 70 - 99 mg/dL Final  . BUN 12/17/2017 30* 8 - 23 mg/dL Final  . Creatinine, Ser 12/17/2017 1.64* 0.61 - 1.24 mg/dL Final  . Calcium 12/17/2017 8.9  8.9 - 10.3 mg/dL Final  . Total Protein 12/17/2017 6.4* 6.5 - 8.1 g/dL Final  . Albumin 12/17/2017 3.6  3.5 - 5.0 g/dL Final  . AST 12/17/2017 23  15 - 41 U/L Final  . ALT 12/17/2017 50* 0 - 44 U/L Final  . Alkaline Phosphatase 12/17/2017 54  38 - 126 U/L Final  . Total Bilirubin 12/17/2017 0.6  0.3 - 1.2 mg/dL Final  . GFR calc non Af Amer 12/17/2017 37* >60 mL/min Final  . GFR calc Af Amer 12/17/2017 43* >60 mL/min Final   Comment: (NOTE) The eGFR has been calculated using the CKD EPI equation. This calculation has not been validated in all clinical situations. eGFR's persistently <60 mL/min signify possible Chronic Kidney Disease.   Georgiann Hahn gap 12/17/2017 6  5 - 15 Final   Performed at [facility], [address] [place], [place] [address]  . WBC 12/17/2017 8.8  4.0 - 10.5 K/uL Final  . RBC 12/17/2017 4.49  4.22 - 5.81 MIL/uL Final  . Hemoglobin 12/17/2017 13.1  13.0 - 17.0 g/dL Final  . HCT 12/17/2017 40.0  39.0 - 52.0 % Final  . MCV 12/17/2017 89.1  80.0 - 100.0 fL Final  . MCH 12/17/2017 29.2  26.0 - 34.0 pg Final  . MCHC 12/17/2017 32.8  30.0 - 36.0 g/dL Final  . RDW 12/17/2017 14.6  11.5 - 15.5 % Final  . Platelets 12/17/2017 157  150 - 400 K/uL Final  . nRBC 12/17/2017 0.0  0.0 - 0.2 % Final  . Neutrophils Relative % 12/17/2017 64  % Final  . Neutro Abs 12/17/2017 5.8  1.7 - 7.7 K/uL Final  . Lymphocytes Relative 12/17/2017 22  % Final  . Lymphs Abs 12/17/2017 2.0  0.7 - 4.0 K/uL Final  . Monocytes Relative 12/17/2017 8  % Final  . Monocytes Absolute  12/17/2017 0.7  0.1 - 1.0 K/uL Final  . Eosinophils Relative  12/17/2017 4  % Final  . Eosinophils Absolute 12/17/2017 0.3  0.0 - 0.5 K/uL Final  . Basophils Relative 12/17/2017 1  % Final  . Basophils Absolute 12/17/2017 0.1  0.0 - 0.1 K/uL Final  . Immature Granulocytes 12/17/2017 1  % Final  . Abs Immature Granulocytes 12/17/2017 0.04  0.00 - 0.07 K/uL Final   Performed at [facility], [address], [place], [place] [address]    Assessment:  Hayden Martin is a 84 y.o. 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  He presented with a 30-35 pound weight loss in 2-3 months.  CT-guided retroperitoneal lymph node biopsy on 01/21/2016 revealed a CD30 positive large B-cell lymphoma. Immunohistochemical studies were positive for CD20, CD30, BCL-2, BCL 6, Ki-67 (> 90%) and negative for CD10 and cyclin D1.  Bone marrow biopsy on 01/21/2016 revealed no evidence of lymphoma.  Flow cytometry was negative.  Cytogenetics were normal (46, XY).  Abdomen and pelvic CT scan on 01/07/2016 revealed multiple solid masses within the spleen.  There was extensive retroperitoneal, portal and mesenteric lymphadenopathy.  Retrocrural node measured 2.1 x 2.0 cm.  Periportal node measured 2.6 x 5.8 cm. Confluent nodal mass is in the retroperitoneum surrounding the great vessels measured 5.6 x 3.8 cmon the left and 5.3 x 2.9 cm on the right.  Left iliac node measured 1.7 x 2.6 cm.  There was possible pleural disease on the left.  The prostate was markedly enlarged.  There was no disease in the liver.  PET scan on 01/16/2016 revealed extensive hypermetabolic adenopathy within the neck, chest, abdomen and pelvis. The spleen was enlarged with multi focal hypermetabolic splenic lesions There was evidence of transperitoneal spread of tumor within the upper abdomen. There were bilateral pleural effusions.  Hepatitis serologies were negative on 01/16/2016. G6PD testing negative.  Echo on 01/18/2016 revealed an EF of 55-60%.   He received 6 cycles of mini-RCHOP  (01/23/2016 - 05/16/2016).  Cycle #1 was complicated by tumor lysis syndrome, leukopenia, and symptomatic pleural effusions.  Diagnostic and 1 liter therapeutic left sided thoracentesis on 02/08/2016 revealed involvement with lymphoma.  He received OnPro Neulasta with cycle #2 - #6.  PET scan on 04/23/2016 revealed an excellent response to treatment. The extensive adenopathy seen on the prior PET-CT showed remarkable improvement.  There was no residual metabolically active lymphoma is identified. There was a moderate left pleural effusion and small right pleural effusion.  PET scan on 06/06/2016 revealed continued improvement and near resolution. There was only minimal retroperitoneal nodularity/adenopathy, much of which likely represents a residuum from previously treated lymphoma.  The residual periaortic lymph node measured 1.2 cm in short axis with maximum SUV 2.2 (formerly 1.4 cm and 3.6, respectively). The minimal residual nodal activity was [place] 3.  He has a history of left lower extremity thrombosis.  He has had 2 clots 1 year apart and is on life long anticoagulation.  Bilateral lower extremity duplex on 01/16/2016 revealed DVT in the left lower extremity involving the calf veins and the left popliteal vein.  IVC filterwas placed on 01/18/2016.  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  There was interval resolution of the left calf vein DVT.  He is on Xarelto.  He has chronic renal insufficiency (Cr 1.3- 1.94).   Chest CT angiogram on 02/02/2016 revealed  no evidence of pulmonary emboli. There were bilateral pleural effusions (moderate size left and small to moderate right).  Symptomatically,  He is doing well.  He denies any B symptoms.  Exam reveals no adenopathy or hepatosplenomegaly. WBC is 8800 with an [place] of 5800.  Platelets 157,000.  BUN 30 and creatinine 1.64.   Uric acid has improved to 5.5.  Plan: 1. Labs today:  CBC with diff, CMP, LDH, uric acid. 2. Stage IIIB diffuse large B cell lymphoma:  Clinically doing well.  No palpable adenopathy or hepatosplenomegaly.  LDH is normal 3.  Left lower extremity thrombosis:  Continue Xarelto.   IVC filter in place.  Chronic left ankle edema. 4:  Hyperuricemia:  Uric acid is normal.  Continue allopurinol. 5.  Port-a-cath:  Patient wishes to have port-a-cath removed.  Schedule port removal with Dr Lucky Cowboy. 6.   RTC in 4 months for MD assessment and labs (CBC with diff, CMP, LDH, uric acid).   Lequita Asal, MD  12/17/17,4:35 PM

## 2017-12-23 ENCOUNTER — Encounter (INDEPENDENT_AMBULATORY_CARE_PROVIDER_SITE_OTHER): Payer: Self-pay

## 2018-01-06 ENCOUNTER — Other Ambulatory Visit (INDEPENDENT_AMBULATORY_CARE_PROVIDER_SITE_OTHER): Payer: Self-pay | Admitting: Nurse Practitioner

## 2018-01-07 ENCOUNTER — Encounter
Admission: RE | Disposition: A | Discharge status: Home / Self Care | Payer: Self-pay | Source: Ambulatory Visit | Attending: Vascular Surgery

## 2018-01-07 ENCOUNTER — Other Ambulatory Visit: Payer: Self-pay

## 2018-01-07 ENCOUNTER — Ambulatory Visit
Admission: RE | Admit: 2018-01-07 | Discharge: 2018-01-07 | Disposition: A | Discharge status: Home / Self Care | Payer: Medicare Other | Source: Ambulatory Visit | Attending: Vascular Surgery | Admitting: Vascular Surgery

## 2018-01-07 DIAGNOSIS — Z452 Encounter for adjustment and management of vascular access device: Secondary | ICD-10-CM | POA: Diagnosis not present

## 2018-01-07 DIAGNOSIS — Z880 Allergy status to penicillin: Secondary | ICD-10-CM | POA: Diagnosis not present

## 2018-01-07 DIAGNOSIS — I129 Hypertensive chronic kidney disease with stage 1 through stage 4 chronic kidney disease, or unspecified chronic kidney disease: Secondary | ICD-10-CM | POA: Insufficient documentation

## 2018-01-07 DIAGNOSIS — Z87891 Personal history of nicotine dependence: Secondary | ICD-10-CM | POA: Diagnosis not present

## 2018-01-07 DIAGNOSIS — Z9889 Other specified postprocedural states: Secondary | ICD-10-CM | POA: Diagnosis not present

## 2018-01-07 DIAGNOSIS — C8590 Non-Hodgkin lymphoma, unspecified, unspecified site: Secondary | ICD-10-CM | POA: Insufficient documentation

## 2018-01-07 DIAGNOSIS — Z885 Allergy status to narcotic agent status: Secondary | ICD-10-CM | POA: Insufficient documentation

## 2018-01-07 DIAGNOSIS — E1122 Type 2 diabetes mellitus with diabetic chronic kidney disease: Secondary | ICD-10-CM | POA: Diagnosis not present

## 2018-01-07 DIAGNOSIS — N183 Chronic kidney disease, stage 3 (moderate): Secondary | ICD-10-CM | POA: Insufficient documentation

## 2018-01-07 HISTORY — PX: PORTA CATH REMOVAL: CATH118286

## 2018-01-07 LAB — GLUCOSE, CAPILLARY: GLUCOSE-CAPILLARY: 150 mg/dL — AB (ref 70–99)

## 2018-01-07 SURGERY — PORTA CATH REMOVAL
Anesthesia: Moderate Sedation

## 2018-01-07 MED ORDER — CLINDAMYCIN PHOSPHATE 300 MG/50ML IV SOLN
300.0000 mg | Freq: Once | INTRAVENOUS | Status: AC
  Administered 2018-01-07: 300 mg via INTRAVENOUS

## 2018-01-07 MED ORDER — MIDAZOLAM HCL 2 MG/2ML IJ SOLN
INTRAMUSCULAR | Status: AC
  Filled 2018-01-07: qty 2

## 2018-01-07 MED ORDER — CLINDAMYCIN PHOSPHATE 300 MG/50ML IV SOLN
INTRAVENOUS | Status: AC
  Administered 2018-01-07: 300 mg via INTRAVENOUS
  Filled 2018-01-07: qty 50

## 2018-01-07 MED ORDER — LIDOCAINE-EPINEPHRINE (PF) 1 %-1:200000 IJ SOLN
INTRAMUSCULAR | Status: AC
  Filled 2018-01-07: qty 60

## 2018-01-07 MED ORDER — FENTANYL CITRATE (PF) 100 MCG/2ML IJ SOLN
INTRAMUSCULAR | Status: DC | PRN
  Administered 2018-01-07: 50 ug via INTRAVENOUS

## 2018-01-07 MED ORDER — MIDAZOLAM HCL 2 MG/2ML IJ SOLN
INTRAMUSCULAR | Status: DC | PRN
  Administered 2018-01-07: 2 mg via INTRAVENOUS

## 2018-01-07 MED ORDER — FENTANYL CITRATE (PF) 100 MCG/2ML IJ SOLN: 12.5000 ug | Freq: Once | INTRAMUSCULAR | Status: DC | PRN

## 2018-01-07 MED ORDER — FENTANYL CITRATE (PF) 100 MCG/2ML IJ SOLN
INTRAMUSCULAR | Status: AC
  Filled 2018-01-07: qty 2

## 2018-01-07 MED ORDER — ONDANSETRON HCL 4 MG/2ML IJ SOLN: 4.0000 mg | Freq: Four times a day (QID) | INTRAMUSCULAR | Status: DC | PRN

## 2018-01-07 MED ORDER — HEPARIN (PORCINE) IN NACL 1000-0.9 UT/500ML-% IV SOLN
INTRAVENOUS | Status: AC
  Filled 2018-01-07: qty 500

## 2018-01-07 MED ORDER — SODIUM CHLORIDE 0.9 % IV SOLN
INTRAVENOUS | Status: DC
  Administered 2018-01-07: 09:00:00 via INTRAVENOUS

## 2018-01-07 SURGICAL SUPPLY — 4 items
DERMABOND ADVANCED (GAUZE/BANDAGES/DRESSINGS) ×2
DERMABOND ADVANCED .7 DNX12 (GAUZE/BANDAGES/DRESSINGS) ×1 IMPLANT
PACK ANGIOGRAPHY (CUSTOM PROCEDURE TRAY) ×3 IMPLANT
SUT MNCRL AB 4-0 PS2 18 (SUTURE) ×3 IMPLANT

## 2018-01-07 NOTE — H&P (Signed)
[facility] Admission History & Physical  MRN : [id number]  Hayden Martin is a 84 y.o. (03-31-1931) male who presents with chief complaint of No chief complaint on file. [name]  History of Present Illness: Patient presents for removal of his Port-A-Cath.  He has no complaints today.  He has a previous history of lymphoma but has completed therapy and no longer needs the Port-A-Cath.  As such, he desires to have it removed as it is locally bothersome to him.  Current Facility-Administered Medications  Medication Dose Route Frequency Provider Last Rate Last Dose  . 0.9 %  sodium chloride infusion   Intravenous Continuous Kris Hartmann, NP 75 mL/hr at 01/07/18 0923    . clindamycin (CLEOCIN) IVPB 300 mg  300 mg Intravenous Once Eulogio Ditch E, NP      . fentaNYL (SUBLIMAZE) injection 12.5 mcg  12.5 mcg Intravenous Once PRN Kris Hartmann, NP      . ondansetron [facility]) injection 4 mg  4 mg Intravenous Q6H PRN Kris Hartmann, NP       Facility-Administered Medications Ordered in Other Encounters  Medication Dose Route Frequency Provider Last Rate Last Dose  . sodium chloride flush (NS) 0.9 % injection 10 mL  10 mL Intravenous PRN Cammie Sickle, MD   10 mL at 03/10/16 6160    Past Medical History:  Diagnosis Date  . Cancer ([place])   . Clotting disorder ([place])   . Diabetes mellitus without complication ([place])   . Heart disease   . Hypertension     Past Surgical History:  Procedure Laterality Date  . HERNIA REPAIR    . PERIPHERAL VASCULAR CATHETERIZATION N/A 01/18/2016   Procedure: IVC Filter Insertion;  Surgeon: Katha Cabal, MD;  Location: [facility];  Service: Cardiovascular;  Laterality: N/A;  . PERIPHERAL VASCULAR CATHETERIZATION N/A 01/21/2016   Procedure: Glori Luis Cath Insertion;  Surgeon: Algernon Huxley, MD;  Location: [facility];  Service: Cardiovascular;  Laterality: N/A;    Social History Previous smoker.  Quit over 30  years ago after having smoked for 20 to 30 years. No IV drug use Alcohol abuse  Family History No bleeding disorders, clotting disorders, autoimmune diseases, or porphyria  Allergies  Allergen Reactions  . Codeine Itching  . Penicillins Itching     REVIEW OF SYSTEMS (Negative unless checked)  Constitutional: [] Weight loss  [] Fever  [] Chills Cardiac: [] Chest pain   [] Chest pressure   [] Palpitations   [] Shortness of breath when laying flat   [] Shortness of breath at rest   [] Shortness of breath with exertion. Vascular:  [] Pain in legs with walking   [] Pain in legs at rest   [] Pain in legs when laying flat   [] Claudication   [] Pain in feet when walking  [] Pain in feet at rest  [] Pain in feet when laying flat   [] History of DVT   [] Phlebitis   [] Swelling in legs   [] Varicose veins   [] Non-healing ulcers Pulmonary:   [] Uses home oxygen   [] Productive cough   [] Hemoptysis   [] Wheeze  [] COPD   [] Asthma Neurologic:  [] Dizziness  [] Blackouts   [] Seizures   [] History of stroke   [] History of TIA  [] Aphasia   [] Temporary blindness   [] Dysphagia   [] Weakness or numbness in arms   [] Weakness or numbness in legs Musculoskeletal:  [x] Arthritis   [] Joint swelling   [] Joint pain   [] Low back pain Hematologic:  [] Easy bruising  [] Easy bleeding   [  x]Hypercoagulable state   [x] Anemic  [] Hepatitis Gastrointestinal:  [] Blood in stool   [] Vomiting blood  [x] Gastroesophageal reflux/heartburn   [] Difficulty swallowing. Genitourinary:  [x] Chronic kidney disease   [] Difficult urination  [] Frequent urination  [] Burning with urination   [] Blood in urine Skin:  [] Rashes   [] Ulcers   [] Wounds Psychological:  [] History of anxiety   []  History of major depression.  Physical Examination  Vitals:   01/07/18 0904  BP: (!) 180/57  Pulse: 66  Resp: 19  Temp: 97.6 F (36.4 C)  TempSrc: Oral  SpO2: 98%  Weight: 97.5 kg  Height: 5\' 11"  (1.803 m)   Body mass index is 29.99 kg/m. Gen: WD/WN, NAD.  Appears younger  than stated age Head: [place]/AT, No temporalis wasting.  Ear/Nose/Throat: Hearing grossly intact, nares w/o erythema or drainage, oropharynx w/o Erythema/Exudate,  Eyes: Conjunctiva clear, sclera non-icteric Neck: Trachea midline.  No JVD.  Pulmonary:  Good air movement, respirations not labored, no use of accessory muscles.  Cardiac: RRR, normal S1, S2. Vascular:  Vessel Right Left  Radial Palpable Palpable                               Musculoskeletal: M/S 5/5 throughout.  Extremities without ischemic changes.  No deformity or atrophy.  Neurologic: Sensation grossly intact in extremities.  Symmetrical.  Speech is fluent. Motor exam as listed above. Psychiatric: Judgment intact, Mood & affect appropriate for pt's clinical situation. Dermatologic: No rashes or ulcers noted.  No cellulitis or open wounds.      CBC Lab Results  Component Value Date   WBC 8.8 12/17/2017   HGB 13.1 12/17/2017   HCT 40.0 12/17/2017   MCV 89.1 12/17/2017   PLT 157 12/17/2017    BMET    Component Value Date/Time   NA 137 12/17/2017 1457   K 4.1 12/17/2017 1457   CL 106 12/17/2017 1457   CO2 25 12/17/2017 1457   GLUCOSE 213 (H) 12/17/2017 1457   BUN 30 (H) 12/17/2017 1457   CREATININE 1.64 (H) 12/17/2017 1457   CALCIUM 8.9 12/17/2017 1457   GFRNONAA 37 (L) 12/17/2017 1457   GFRAA 43 (L) 12/17/2017 1457   Estimated Creatinine Clearance: 39.9 mL/min (A) (by C-G formula based on SCr of 1.64 mg/dL (H)).  COAG Lab Results  Component Value Date   INR 1.37 01/21/2016   INR 1.66 01/17/2016    Radiology No results found.    Assessment/Plan 1.  Nonfunctional Port-A-Cath.  No longer being used.  Can be removed.  Risks and benefits are discussed 2.  Lymphoma.  Completed therapy and no longer needs his durable venous access.  No new complaints 3.  Chronic kidney disease.  No contrast to be used today.  Most recent glomerular filtration rate was just below 40 which would be stage III chronic  kidney disease. 4.  Hypertension.  Stable on outpatient medications and blood pressure control important in reducing the progression of atherosclerotic disease. On appropriate oral medications.    Leotis Pain, MD  01/07/2018 9:33 AM

## 2018-01-07 NOTE — Op Note (Signed)
[facility]       Operative Note  Date: 01/07/2018  Preoperative diagnosis:  1. Lymphoma, completed therapy and no longer using port  Postoperative diagnosis:  Same as above  Procedures: #1. Removal of right jugular port a cath   Surgeon: Leotis Pain, MD   Assistant: Hezzie Bump, PA-C  Anesthesia: Local with moderate conscious sedation for 20 minutes using 2 mg of Versed and 50 mcg of Fentanyl  Fluoroscopy time: none  Contrast used: 0  Estimated blood loss: Minimal  Indication for the procedure:  The patient is a 84 y.o. male who has completed their treatment for lymphoma and no longer needs their Port-A-Cath. The patient desires to have this removed. Risks and benefits including need for potential replacement with recurrent disease were discussed and patient is agreeable to proceed. An assistant was present during the procedure to help facilitate the exposure and expedite the procedure.  Description of procedure: The patient was brought to the vascular and interventional radiology suite. Moderate conscious sedation was administered during a face to face encounter with the patient throughout the procedure with my supervision of the RN administering medicines and monitoring the patient's vital signs, pulse oximetry, telemetry and mental status throughout from the start of the procedure until the patient was taken to the recovery room. The assistant provided retraction and mobilization to help facilitate exposure and expedite the procedure throughout the entire procedure.  This included following suture, using retractors, and optimizing lighting as well as wound closure. The right neck chest and shoulder were sterilely prepped and draped, and a sterile surgical field was created. The area was then anesthetized with 1% lidocaine copiously. The previous incision was reopened and electrocautery used to dissected down to the port  and the catheter. These were dissected free and the catheter was gently removed from the vein in its entirety. The port was dissected out from the fibrous connective tissue and the Prolene sutures were removed. The port was then removed in its entirety including the catheter. The wound was then closed with a 3-0 Vicryl and a 4-0 Monocryl and Dermabond was placed as a dressing. The patient was then taken to the recovery room in stable condition having tolerated the procedure well.  Complications: none  Condition: stable   Leotis Pain, MD 01/07/2018 9:55 AM   This note was created with Dragon Medical transcription system. Any errors in dictation are purely unintentional.

## 2018-01-07 NOTE — H&P (Signed)
[facility] History & Physical Update  The patient was interviewed and re-examined.  The patient's previous History and Physical has been reviewed and is unchanged.  There is no change in the plan of care. We plan to proceed with the scheduled procedure.  Leotis Pain, MD  01/07/2018, 9:18 AM

## 2018-02-03 ENCOUNTER — Ambulatory Visit (INDEPENDENT_AMBULATORY_CARE_PROVIDER_SITE_OTHER): Payer: Medicare Other | Admitting: Nurse Practitioner

## 2018-02-03 ENCOUNTER — Encounter (INDEPENDENT_AMBULATORY_CARE_PROVIDER_SITE_OTHER): Payer: Self-pay | Admitting: Nurse Practitioner

## 2018-02-03 VITALS — BP 171/68 | HR 54 | Resp 18 | Ht 71.0 in | Wt 221.0 lb

## 2018-02-03 DIAGNOSIS — Z452 Encounter for adjustment and management of vascular access device: Secondary | ICD-10-CM | POA: Diagnosis not present

## 2018-02-03 DIAGNOSIS — I10 Essential (primary) hypertension: Secondary | ICD-10-CM | POA: Diagnosis not present

## 2018-02-09 ENCOUNTER — Encounter (INDEPENDENT_AMBULATORY_CARE_PROVIDER_SITE_OTHER): Payer: Self-pay | Admitting: Nurse Practitioner

## 2018-02-09 NOTE — Progress Notes (Signed)
Subjective:    Patient ID: Hayden Martin, male    DOB: [birth year], 84 y.o.   MRN: [id number] Chief Complaint  Patient presents with  . Follow-up    4 weel wound check    HPI  Hayden Martin is a 84 y.o. male that is following up in office following Port-A-Cath removal.  The Port-A-Cath was situated above his right chest.  Now there is a small and sized raised area above the where the port was surgically removed.  The patient denies any pain or discomfort.  He denies any drainage from the site.  He denies any fever, chills, nausea or vomiting.  Past Medical History:  Diagnosis Date  . Cancer ([place])   . Clotting disorder ([place])   . Diabetes mellitus without complication ([place])   . Heart disease   . Hypertension     Past Surgical History:  Procedure Laterality Date  . HERNIA REPAIR    . PERIPHERAL VASCULAR CATHETERIZATION N/A 01/18/2016   Procedure: IVC Filter Insertion;  Surgeon: Katha Cabal, MD;  Location: [facility];  Service: Cardiovascular;  Laterality: N/A;  . PERIPHERAL VASCULAR CATHETERIZATION N/A 01/21/2016   Procedure: Glori Luis Cath Insertion;  Surgeon: Algernon Huxley, MD;  Location: [facility];  Service: Cardiovascular;  Laterality: N/A;  . PORTA CATH REMOVAL N/A 01/07/2018   Procedure: PORTA CATH REMOVAL;  Surgeon: Algernon Huxley, MD;  Location: [facility];  Service: Cardiovascular;  Laterality: N/A;    Social History   Socioeconomic History  . Marital status: Widowed    Spouse name: Not on file  . Number of children: Not on file  . Years of education: Not on file  . Highest education level: Not on file  Occupational History  . Not on file  Social Needs  . Financial resource strain: Not on file  . Food insecurity:    Worry: Not on file    Inability: Not on file  . Transportation needs:    Medical: Not on file    Non-medical: Not on file  Tobacco Use  . Smoking status: Former Smoker    Packs/day: 1.50    Last attempt to  quit: 01/01/1986    Years since quitting: 32.1  . Smokeless tobacco: Former Systems developer  . Tobacco comment: Smoked for 20 years  Substance and Sexual Activity  . Alcohol use: Not on file  . Drug use: Not on file  . Sexual activity: Not on file  Lifestyle  . Physical activity:    Days per week: Not on file    Minutes per session: Not on file  . Stress: Not on file  Relationships  . Social connections:    Talks on phone: Not on file    Gets together: Not on file    Attends religious service: Not on file    Active member of club or organization: Not on file    Attends meetings of clubs or organizations: Not on file    Relationship status: Not on file  . Intimate partner violence:    Fear of current or ex partner: Not on file    Emotionally abused: Not on file    Physically abused: Not on file    Forced sexual activity: Not on file  Other Topics Concern  . Not on file  Social History Narrative  . Not on file    History reviewed. No pertinent family history.  Allergies  Allergen Reactions  . Codeine Itching  .  Penicillins Itching     Review of Systems   Review of Systems: Negative Unless Checked Constitutional: [] Weight loss  [] Fever  [] Chills Cardiac: [] Chest pain   []  Atrial Fibrillation  [] Palpitations   [] Shortness of breath when laying flat   [] Shortness of breath with exertion. Vascular:  [] Pain in legs with walking   [] Pain in legs with standing  [] History of DVT   [] Phlebitis   [x] Swelling in legs   [] Varicose veins   [] Non-healing ulcers Pulmonary:   [] Uses home oxygen   [] Productive cough   [] Hemoptysis   [] Wheeze  [] COPD   [] Asthma Neurologic:  [] Dizziness   [] Seizures   [] History of stroke   [] History of TIA  [] Aphasia   [] Vissual changes   [] Weakness or numbness in arm   [] Weakness or numbness in leg Musculoskeletal:   [] Joint swelling   [] Joint pain   [] Low back pain  []  History of Knee Replacement Hematologic:  [] Easy bruising  [] Easy bleeding   [] Hypercoagulable  state   [] Anemic Gastrointestinal:  [] Diarrhea   [] Vomiting  [] Gastroesophageal reflux/heartburn   [] Difficulty swallowing. Genitourinary:  [] Chronic kidney disease   [] Difficult urination  [] Anuric   [] Blood in urine Skin:  [] Rashes   [] Ulcers  Psychological:  [] History of anxiety   []  History of major depression  []  Memory Difficulties     Objective:   Physical Exam  BP (!) 171/68 (BP Location: Right Arm, Patient Position: Sitting)   Pulse (!) 54   Resp 18   Ht 5\' 11"  (1.803 m)   Wt 221 lb (100.2 kg)   BMI 30.82 kg/m   Gen: WD/WN, NAD Head: [place]/AT, No temporalis wasting.  Ear/Nose/Throat: Hearing grossly intact, nares w/o erythema or drainage Eyes: PER, EOMI, sclera nonicteric.  Neck: Supple, no masses.  No JVD.  Pulmonary:  Good air movement, no use of accessory muscles.  Cardiac: RRR Vascular:  Vessel Right Left  Radial Palpable Palpable   Gastrointestinal: soft, non-distended. No guarding/no peritoneal signs.  Musculoskeletal: M/S 5/5 throughout.  No deformity or atrophy.  Neurologic: Pain and light touch intact in extremities.  Symmetrical.  Speech is fluent. Motor exam as listed above. Psychiatric: Judgment intact, Mood & affect appropriate for pt's clinical situation. Dermatologic: No Venous rashes. No Ulcers Noted.  No changes consistent with cellulitis.  Small raised area approximately the size of an almond over the right chest due to Port-A-Cath removal Lymph : No Cervical lymphadenopathy, no lichenification or skin changes of chronic lymphedema.      Assessment & Plan:   1. Encounter for care related to vascular access port Patient is doing well following Port-A-Cath removal.  No evidence of postsurgical infection.  Advised patient that the swelling should go down over time as wound heals.  Also advised the patient that a small amount of serosanguineous drainage if it becomes purulent or changes color to something such as thick milky white or green he should  contact our office for assessment.  Otherwise we will follow-up with patient on an as-needed basis.  2. HTN (hypertension), benign Continue antihypertensive medications as already ordered, these medications have been reviewed and there are no changes at this time.    Current Outpatient Medications on File Prior to Visit  Medication Sig Dispense Refill  . allopurinol (ZYLOPRIM) 100 MG tablet Take 1 tablet (100 mg total) by mouth daily. 90 tablet 3  . amLODipine (NORVASC) 5 MG tablet Take 5 mg by mouth daily.    [name] aspirin EC 81 MG tablet Take  81 mg by mouth daily.    [name] atorvastatin (LIPITOR) 40 MG tablet Take 20 mg by mouth daily.    . carvedilol (COREG) 6.25 MG tablet TAKE ONE (1) TABLET BY MOUTH TWO (2) TIMES DAILY    . Cholecalciferol (VITAMIN D3) 10000 units TABS Take by mouth.    . dronabinol (MARINOL) 2.5 MG capsule Take 1 capsule (2.5 mg total) by mouth 2 (two) times daily before lunch and supper. (Patient not taking: Reported on 01/07/2018) 60 capsule 0  . furosemide (LASIX) 20 MG tablet Take 20 mg by mouth.    . Insulin Glargine (LANTUS [place]) Inject into the skin. Inject 32 units subcutaneously at bedtime.    . Insulin NPH Isophane & Regular (NOVOLIN 70/30 [place]) Inject 6 Units into the skin 3 (three) times daily.     [name] ketoconazole (NIZORAL) 2 % cream APPLY AT BEDTIME DAILY TO AFFECTED AREAS    . lidocaine-prilocaine (EMLA) cream Apply 1 application topically as needed. (Patient not taking: Reported on 12/17/2017) 30 g 3  . ondansetron (ZOFRAN) 8 MG tablet Take 1 tablet (8 mg total) by mouth 2 (two) times daily as needed for nausea or vomiting. (Patient not taking: Reported on 12/17/2017) 30 tablet 0  . rivaroxaban (XARELTO) 20 MG TABS tablet Take 20 mg by mouth daily with supper.     Current Facility-Administered Medications on File Prior to Visit  Medication Dose Route Frequency Provider Last Rate Last Dose  . sodium chloride flush (NS) 0.9 % injection 10 mL  10 mL Intravenous PRN  Cammie Sickle, MD   10 mL at 03/10/16 0949    There are no Patient Instructions on file for this visit. Return if symptoms worsen or fail to improve.   Kris Hartmann, NP  This note was completed with Sales executive.  Any errors are purely unintentional.

## 2018-04-15 ENCOUNTER — Ambulatory Visit: Payer: Medicare Other | Admitting: Hematology and Oncology

## 2018-04-15 ENCOUNTER — Other Ambulatory Visit: Payer: Medicare Other

## 2018-04-16 ENCOUNTER — Encounter: Payer: Self-pay | Admitting: Hematology and Oncology

## 2018-04-16 ENCOUNTER — Inpatient Hospital Stay: Payer: Medicare Other | Attending: Hematology and Oncology

## 2018-04-16 ENCOUNTER — Inpatient Hospital Stay (HOSPITAL_BASED_OUTPATIENT_CLINIC_OR_DEPARTMENT_OTHER): Payer: Medicare Other | Admitting: Hematology and Oncology

## 2018-04-16 VITALS — BP 147/68 | HR 60 | Temp 97.5°F | Resp 18 | Wt 218.3 lb

## 2018-04-16 DIAGNOSIS — Z7982 Long term (current) use of aspirin: Secondary | ICD-10-CM

## 2018-04-16 DIAGNOSIS — N189 Chronic kidney disease, unspecified: Secondary | ICD-10-CM

## 2018-04-16 DIAGNOSIS — E790 Hyperuricemia without signs of inflammatory arthritis and tophaceous disease: Secondary | ICD-10-CM | POA: Insufficient documentation

## 2018-04-16 DIAGNOSIS — Z86718 Personal history of other venous thrombosis and embolism: Secondary | ICD-10-CM | POA: Insufficient documentation

## 2018-04-16 DIAGNOSIS — Z87891 Personal history of nicotine dependence: Secondary | ICD-10-CM

## 2018-04-16 DIAGNOSIS — E119 Type 2 diabetes mellitus without complications: Secondary | ICD-10-CM | POA: Insufficient documentation

## 2018-04-16 DIAGNOSIS — I10 Essential (primary) hypertension: Secondary | ICD-10-CM

## 2018-04-16 DIAGNOSIS — Z794 Long term (current) use of insulin: Secondary | ICD-10-CM

## 2018-04-16 DIAGNOSIS — C8338 Diffuse large B-cell lymphoma, lymph nodes of multiple sites: Secondary | ICD-10-CM | POA: Diagnosis present

## 2018-04-16 DIAGNOSIS — Z7901 Long term (current) use of anticoagulants: Secondary | ICD-10-CM | POA: Diagnosis not present

## 2018-04-16 DIAGNOSIS — Z79899 Other long term (current) drug therapy: Secondary | ICD-10-CM | POA: Diagnosis not present

## 2018-04-16 DIAGNOSIS — I82432 Acute embolism and thrombosis of left popliteal vein: Secondary | ICD-10-CM

## 2018-04-16 LAB — CBC WITH DIFFERENTIAL/PLATELET
ABS IMMATURE GRANULOCYTES: 0.02 10*3/uL (ref 0.00–0.07)
Basophils Absolute: 0.1 10*3/uL (ref 0.0–0.1)
Basophils Relative: 1 %
EOS PCT: 4 %
Eosinophils Absolute: 0.3 10*3/uL (ref 0.0–0.5)
HCT: 40.3 % (ref 39.0–52.0)
HEMOGLOBIN: 13 g/dL (ref 13.0–17.0)
Immature Granulocytes: 0 %
LYMPHS ABS: 1.7 10*3/uL (ref 0.7–4.0)
LYMPHS PCT: 21 %
MCH: 29.1 pg (ref 26.0–34.0)
MCHC: 32.3 g/dL (ref 30.0–36.0)
MCV: 90.4 fL (ref 80.0–100.0)
MONOS PCT: 6 %
Monocytes Absolute: 0.5 10*3/uL (ref 0.1–1.0)
Neutro Abs: 5.6 10*3/uL (ref 1.7–7.7)
Neutrophils Relative %: 68 %
Platelets: 172 10*3/uL (ref 150–400)
RBC: 4.46 MIL/uL (ref 4.22–5.81)
RDW: 14.8 % (ref 11.5–15.5)
WBC: 8.2 10*3/uL (ref 4.0–10.5)
nRBC: 0 % (ref 0.0–0.2)

## 2018-04-16 LAB — COMPREHENSIVE METABOLIC PANEL
ALT: 65 U/L — ABNORMAL HIGH (ref 0–44)
AST: 28 U/L (ref 15–41)
Albumin: 3.6 g/dL (ref 3.5–5.0)
Alkaline Phosphatase: 47 U/L (ref 38–126)
Anion gap: 6 (ref 5–15)
BUN: 32 mg/dL — ABNORMAL HIGH (ref 8–23)
CHLORIDE: 104 mmol/L (ref 98–111)
CO2: 27 mmol/L (ref 22–32)
CREATININE: 1.77 mg/dL — AB (ref 0.61–1.24)
Calcium: 8.9 mg/dL (ref 8.9–10.3)
GFR calc Af Amer: 40 mL/min — ABNORMAL LOW (ref >60)
GFR, EST NON AFRICAN AMERICAN: 35 mL/min — AB (ref >60)
Glucose, Bld: 276 mg/dL — ABNORMAL HIGH (ref 70–99)
Potassium: 4.6 mmol/L (ref 3.5–5.1)
Sodium: 137 mmol/L (ref 135–145)
Total Bilirubin: 0.8 mg/dL (ref 0.3–1.2)
Total Protein: 6.7 g/dL (ref 6.5–8.1)

## 2018-04-16 LAB — URIC ACID: URIC ACID, SERUM: 6.8 mg/dL (ref 3.7–8.6)

## 2018-04-16 LAB — LACTATE DEHYDROGENASE: LDH: 139 U/L (ref 98–192)

## 2018-04-16 NOTE — Progress Notes (Signed)
[facility] day:  04/16/2018  Chief Complaint: Hayden Martin is a 85 y.o. 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   HPI:  The patient was last seen in the medical oncology clinic on 12/17/2017.  At that time, he was doing well.  He denied any B symptoms.  Exam revealed no adenopathy or hepatosplenomegaly. WBC was 8800 with an [place] of 5800.  Platelets were 157,000.  BUN was 30 and creatinine 1.64.  Uric acid had improved to 5.5.  Port-a-cath was removed on 01/07/2018.  During the interim, he has felt "alright".  He describes a cold through Christmas and January.  He denies any fevers or sweats.  Weight is down 6 pounds in 4 months.   Past Medical History:  Diagnosis Date  . Cancer ([place])   . Clotting disorder ([place])   . Diabetes mellitus without complication ([place])   . Heart disease   . Hypertension     Past Surgical History:  Procedure Laterality Date  . HERNIA REPAIR    . PERIPHERAL VASCULAR CATHETERIZATION N/A 01/18/2016   Procedure: IVC Filter Insertion;  Surgeon: Katha Cabal, MD;  Location: [facility];  Service: Cardiovascular;  Laterality: N/A;  . PERIPHERAL VASCULAR CATHETERIZATION N/A 01/21/2016   Procedure: Glori Luis Cath Insertion;  Surgeon: Algernon Huxley, MD;  Location: [facility];  Service: Cardiovascular;  Laterality: N/A;  . PORTA CATH REMOVAL N/A 01/07/2018   Procedure: PORTA CATH REMOVAL;  Surgeon: Algernon Huxley, MD;  Location: [facility];  Service: Cardiovascular;  Laterality: N/A;    History reviewed. No pertinent family history.  Social History:  reports that he quit smoking about 32 years ago. He smoked 1.50 packs per day. He has quit using smokeless tobacco. No history on file for alcohol and drug.  He was in the [employer].  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  He was discharged from [facility] on  02/20/2016.  He has 3 children who live locally.  He recently bought a new car [facility]).  He describes recently being a passenger in a [employer] airplane.  The patient is alone today.  Allergies:  Allergies  Allergen Reactions  . Codeine Itching  . Penicillins Itching    Current Medications: Current Outpatient Medications  Medication Sig Dispense Refill  . allopurinol (ZYLOPRIM) 100 MG tablet Take 1 tablet (100 mg total) by mouth daily. 90 tablet 3  . amLODipine (NORVASC) 5 MG tablet Take 5 mg by mouth daily.    [name] aspirin EC 81 MG tablet Take 81 mg by mouth daily.    [name] atorvastatin (LIPITOR) 40 MG tablet Take 20 mg by mouth daily.    . carvedilol (COREG) 6.25 MG tablet TAKE ONE (1) TABLET BY MOUTH TWO (2) TIMES DAILY    . furosemide (LASIX) 20 MG tablet Take 20 mg by mouth daily.     . Insulin Glargine (LANTUS [place]) Inject into the skin. Inject 32 units subcutaneously at bedtime.    [name] ketoconazole (NIZORAL) 2 % cream APPLY AT BEDTIME DAILY TO AFFECTED AREAS    . rivaroxaban (XARELTO) 20 MG TABS tablet Take 20 mg by mouth daily with supper.    . Semaglutide,0.25 or 0.5MG/DOS, 2 MG/1.5ML SOPN Inject 0.25 mg into the skin once a week.     . Insulin NPH Isophane & Regular (  NOVOLIN 70/30 [place]) Inject 6 Units into the skin 3 (three) times daily.      No current facility-administered medications for this visit.    Facility-Administered Medications Ordered in Other Visits  Medication Dose Route Frequency Provider Last Rate Last Dose  . sodium chloride flush (NS) 0.9 % injection 10 mL  10 mL Intravenous PRN Cammie Sickle, MD   10 mL at 03/10/16 0949    Review of Systems  Constitutional: Positive for weight loss (6 pounds). Negative for chills, diaphoresis, fever and malaise/fatigue.       Feels "alright".  HENT: Positive for hearing loss (hearing aid). Negative for congestion, ear pain, nosebleeds, sinus pain and sore throat.   Eyes: Negative.  Negative for blurred vision, double  vision and photophobia.  Respiratory: Negative.  Negative for cough, hemoptysis, sputum production and shortness of breath.   Cardiovascular: Positive for leg swelling. Negative for chest pain, palpitations, orthopnea and PND.  Gastrointestinal: Negative.  Negative for abdominal pain, blood in stool, constipation, diarrhea, heartburn, melena, nausea and vomiting.  Genitourinary: Negative.  Negative for dysuria, frequency, hematuria and urgency.  Musculoskeletal: Positive for back pain (chronic). Negative for falls, joint pain, myalgias and neck pain.       History of DVT.  Chronic leg cramps at night.  Skin: Negative.  Negative for itching and rash.  Neurological: Negative for dizziness, tremors, sensory change, speech change, focal weakness, weakness and headaches.  Endo/Heme/Allergies: Bruises/bleeds easily.       Diabetes.  Psychiatric/Behavioral: Negative.  Negative for depression and memory loss. The patient is not nervous/anxious and does not have insomnia.   All other systems reviewed and are negative.  Performance status (ECOG): 1  Vital Signs BP (!) 147/68 (BP Location: Right Arm, Patient Position: Sitting)   Pulse 60   Temp (!) 97.5 F (36.4 C) (Oral)   Resp 18   Wt 218 lb 4.8 oz (99 kg)   SpO2 96%   BMI 30.45 kg/m   Physical Exam  Constitutional: He is oriented to person, place, and time and well-developed, well-nourished, and in no distress. No distress.  HENT:  Head: Normocephalic and atraumatic.  Mouth/Throat: Oropharynx is clear and moist. No oropharyngeal exudate.  Gray hair.  Male pattern baldness.  Eyes: Pupils are equal, round, and reactive to light. Conjunctivae and EOM are normal. No scleral icterus.  Blue eyes.  Neck: Normal range of motion. Neck supple. No JVD present.  Cardiovascular: Normal rate, regular rhythm and normal heart sounds. Exam reveals no gallop.  No murmur heard. Pulmonary/Chest: Effort normal and breath sounds normal. No respiratory  distress. He has no wheezes. He has no rales.  Abdominal: Soft. Bowel sounds are normal. He exhibits no distension and no mass. There is no abdominal tenderness. There is no rebound and no guarding.  Musculoskeletal: Normal range of motion.        General: No tenderness or edema (subtle left lower extremity changes).  Lymphadenopathy:    He has no cervical adenopathy.    He has no axillary adenopathy.       Right: No supraclavicular adenopathy present.       Left: No supraclavicular adenopathy present.  Neurological: He is alert and oriented to person, place, and time. Gait normal.  Skin: Skin is warm and dry. No rash noted. He is not diaphoretic. No erythema. No pallor.  Psychiatric: Mood, affect and judgment normal.  Nursing note and vitals reviewed.   Appointment on 04/16/2018  Component Date Value Ref Range  Status  . WBC 04/16/2018 8.2  4.0 - 10.5 K/uL Final  . RBC 04/16/2018 4.46  4.22 - 5.81 MIL/uL Final  . Hemoglobin 04/16/2018 13.0  13.0 - 17.0 g/dL Final  . HCT 04/16/2018 40.3  39.0 - 52.0 % Final  . MCV 04/16/2018 90.4  80.0 - 100.0 fL Final  . MCH 04/16/2018 29.1  26.0 - 34.0 pg Final  . MCHC 04/16/2018 32.3  30.0 - 36.0 g/dL Final  . RDW 04/16/2018 14.8  11.5 - 15.5 % Final  . Platelets 04/16/2018 172  150 - 400 K/uL Final  . nRBC 04/16/2018 0.0  0.0 - 0.2 % Final  . Neutrophils Relative % 04/16/2018 68  % Final  . Neutro Abs 04/16/2018 5.6  1.7 - 7.7 K/uL Final  . Lymphocytes Relative 04/16/2018 21  % Final  . Lymphs Abs 04/16/2018 1.7  0.7 - 4.0 K/uL Final  . Monocytes Relative 04/16/2018 6  % Final  . Monocytes Absolute 04/16/2018 0.5  0.1 - 1.0 K/uL Final  . Eosinophils Relative 04/16/2018 4  % Final  . Eosinophils Absolute 04/16/2018 0.3  0.0 - 0.5 K/uL Final  . Basophils Relative 04/16/2018 1  % Final  . Basophils Absolute 04/16/2018 0.1  0.0 - 0.1 K/uL Final  . Immature Granulocytes 04/16/2018 0  % Final  . Abs Immature Granulocytes 04/16/2018 0.02  0.00 -  0.07 K/uL Final   Performed at [facility], [address] [place], [place] [address]    Assessment:  Hayden Martin is a 85 y.o. 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  He presented with a 30-35 pound weight loss in 2-3 months.  CT-guided retroperitoneal lymph node biopsy on 01/21/2016 revealed a CD30 positive large B-cell lymphoma. Immunohistochemical studies were positive for CD20, CD30, BCL-2, BCL 6, Ki-67 (> 90%) and negative for CD10 and cyclin D1.  Bone marrow biopsy on 01/21/2016 revealed no evidence of lymphoma.  Flow cytometry was negative.  Cytogenetics were normal (46, XY).  Abdomen and pelvic CT scan on 01/07/2016 revealed multiple solid masses within the spleen.  There was extensive retroperitoneal, portal and mesenteric lymphadenopathy.  Retrocrural node measured 2.1 x 2.0 cm.  Periportal node measured 2.6 x 5.8 cm. Confluent nodal mass is in the retroperitoneum surrounding the great vessels measured 5.6 x 3.8 cmon the left and 5.3 x 2.9 cm on the right.  Left iliac node measured 1.7 x 2.6 cm.  There was possible pleural disease on the left.  The prostate was markedly enlarged.  There was no disease in the liver.  PET scan on 01/16/2016 revealed extensive hypermetabolic adenopathy within the neck, chest, abdomen and pelvis. The spleen was enlarged with multi focal hypermetabolic splenic lesions There was evidence of transperitoneal spread of tumor within the upper abdomen. There were bilateral pleural effusions.  Hepatitis serologies were negative on 01/16/2016. G6PD testing negative.  Echo on 01/18/2016 revealed an EF of 55-60%.   He received 6 cycles of mini-RCHOP (01/23/2016 - 05/16/2016).  Cycle #1 was complicated by tumor lysis syndrome, leukopenia, and symptomatic pleural effusions.  Diagnostic and 1 liter therapeutic left sided thoracentesis on 02/08/2016 revealed involvement with lymphoma.  He received OnPro Neulasta with cycle #2 - #6.  PET  scan on 04/23/2016 revealed an excellent response to treatment. The extensive adenopathy seen on the prior PET-CT showed remarkable improvement.  There was no residual metabolically active lymphoma is identified. There was a moderate left pleural effusion and small right pleural effusion.  PET scan on 06/06/2016 revealed continued improvement and near resolution. There was only minimal retroperitoneal nodularity/adenopathy, much of which likely represents a residuum from previously treated lymphoma.  The residual periaortic lymph node measured 1.2 cm in short axis with maximum SUV 2.2 (formerly 1.4 cm and 3.6, respectively). The minimal residual nodal activity was [place] 3.  He has a history of left lower extremity thrombosis.  He has had 2 clots 1 year apart and is on life long anticoagulation.  Bilateral lower extremity duplex on 01/16/2016 revealed DVT in the left lower extremity involving the calf veins and the left popliteal vein.  IVC filterwas placed on 01/18/2016.  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  There was interval resolution of the left calf vein DVT.  He is on Xarelto.  He has chronic renal insufficiency (Cr 1.3- 1.94).  Creatinine is 1.77 today.  Chest CT angiogram on 02/02/2016 revealed no evidence of pulmonary emboli. There were bilateral pleural effusions (moderate size left and small to moderate right).  Symptomatically, he denies any fevers or sweats.  Weight is down by 6 pounds in 4 months.  Exam reveals no adenopathy or hepatosplenomegaly.  Uric acid is 6.8.  Plan: 1. Labs today:  CBC with diff, CMP, LDH, uric acid. 2. Stage IIIB diffuse large B cell lymphoma  Clinically, he is doing well.  Exam reveals no adenopathy or hepatosplenomegaly.  LDH is 139 (normal). 3.   Left lower extremity thrombosis  Patient remains on Xarelto.   IVC filter  remains in place. 4.   Hyperuricemia  Uric acid remains normal.  Continue allopurinol. 5.   RTC in 4 months in [place] for MD assessment and labs (CBC with diff, CMP, LDH, uric acid).    I discussed the assessment and treatment plan with the patient.  The patient was provided an opportunity to ask questions and all were answered.  The patient agreed with the plan and demonstrated an understanding of the instructions.  The patient was advised to call back or seek an in person evaluation if the symptoms worsen or if the condition fails to improve as anticipated.    Lequita Asal, MD  04/16/2018, 1:29 PM

## 2018-04-16 NOTE — Progress Notes (Signed)
Pt here for follow up. Denies any concerns at this time.  

## 2018-08-27 ENCOUNTER — Other Ambulatory Visit: Payer: Medicare Other

## 2018-08-27 ENCOUNTER — Ambulatory Visit: Payer: Medicare Other | Admitting: Hematology and Oncology

## 2018-08-30 NOTE — Progress Notes (Signed)
[facility]  [address] [place], [place] [address] Phone: [phone]  Fax: [phone]   Clinic Day:  09/01/2018  Referring physician: Kirk Ruths, MD  Chief Complaint: Hayden Martin is a 85 y.o. 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   HPI: The patient was last seen in the medical oncology clinic on 04/16/2018. At that time, he denied any fevers or sweats.  Weight was down by 6 pounds in 4 months.  Exam revealed no adenopathy or hepatosplenomegaly.  Uric acid was 6.8.  During the interim, he is doing "very well." He notes left foot edema present for one week.  He continues on Xarelto. He denies any fevers, sweats, weight loss, lumps, or bumps. He is down 2 lbs in the clinic today.  Appetite is good and he doesn't fell full quickly when eating.  He bruises easily on Xarelto. He notes stiffness in his back.   He sees his PCP every 4-5 months and will see his optometrist next week to have his retinas checked. His diabetes is moderately well controlled.    Past Medical History:  Diagnosis Date  . Cancer ([place])   . Clotting disorder ([place])   . Diabetes mellitus without complication ([place])   . Heart disease   . Hypertension     Past Surgical History:  Procedure Laterality Date  . HERNIA REPAIR    . PERIPHERAL VASCULAR CATHETERIZATION N/A 01/18/2016   Procedure: IVC Filter Insertion;  Surgeon: Katha Cabal, MD;  Location: [place] [facility];  Service: Cardiovascular;  Laterality: N/A;  . PERIPHERAL VASCULAR CATHETERIZATION N/A 01/21/2016   Procedure: Glori Luis Cath Insertion;  Surgeon: Algernon Huxley, MD;  Location: [place] [facility];  Service: Cardiovascular;  Laterality: N/A;  . PORTA CATH REMOVAL N/A 01/07/2018   Procedure: PORTA CATH REMOVAL;  Surgeon: Algernon Huxley, MD;  Location: [place] [facility];  Service: Cardiovascular;   Laterality: N/A;    No family history on file.  Social History:  reports that he quit smoking about 32 years ago. He smoked 1.50 packs per day. He has quit using smokeless tobacco. No history on file for alcohol and drug. He was in the [employer]. The patient is a retired Engineer, structural. He lives by himself. He was discharged from [facility] on 02/20/2016.  He has 3 children who live locally. He recently bought a new car [employer] [employer] [facility] [employer]). He describes recently being a passenger in a [employer] airplane. He likes to play poker. The patient is alone today.  Allergies:  Allergies  Allergen Reactions  . Codeine Itching  . Penicillins Itching    Current Medications: Current Outpatient Medications  Medication Sig Dispense Refill  . allopurinol (ZYLOPRIM) 100 MG tablet Take 1 tablet (100 mg total) by mouth daily. 90 tablet 3  . amLODipine (NORVASC) 5 MG tablet Take 10 mg by mouth daily.     [name] aspirin EC 81 MG tablet Take 81 mg by mouth daily.    [name] atorvastatin (LIPITOR) 40 MG tablet Take 20 mg by mouth daily.    . carvedilol (COREG) 6.25 MG tablet TAKE ONE (1) TABLET BY MOUTH TWO (2) TIMES DAILY    . furosemide (LASIX) 20 MG tablet Take 20 mg by mouth daily.     . Insulin Glargine (LANTUS Donora) Inject into the skin. Inject 32 units subcutaneously at bedtime.    [name]  ketoconazole (NIZORAL) 2 % cream APPLY AT BEDTIME DAILY TO AFFECTED AREAS    . liraglutide (VICTOZA) 18 MG/3ML SOPN Inject into the skin daily.    . rivaroxaban (XARELTO) 20 MG TABS tablet Take 20 mg by mouth daily with supper.     No current facility-administered medications for this visit.    Facility-Administered Medications Ordered in Other Visits  Medication Dose Route Frequency Provider Last Rate Last Dose  . sodium chloride flush (NS) 0.9 % injection 10 mL  10 mL Intravenous PRN Cammie Sickle, MD   10 mL at 03/10/16 0949    Review of Systems  Constitutional: Positive for weight loss (2 lbs). Negative for chills,  diaphoresis, fever and malaise/fatigue.       Doing "very well."  HENT: Positive for hearing loss (hearing aid). Negative for congestion, ear pain, nosebleeds, sinus pain and sore throat.   Eyes: Negative.  Negative for blurred vision, double vision and photophobia.  Respiratory: Negative.  Negative for cough, hemoptysis, sputum production and shortness of breath.   Cardiovascular: Positive for leg swelling (left foot). Negative for chest pain, palpitations, orthopnea and PND.  Gastrointestinal: Negative.  Negative for abdominal pain, blood in stool, constipation, diarrhea, heartburn, melena, nausea and vomiting.  Genitourinary: Negative.  Negative for dysuria, frequency, hematuria and urgency.  Musculoskeletal: Positive for back pain (stiff, chronic). Negative for falls, joint pain, myalgias and neck pain.       History of DVT.  Chronic leg cramps at night.  Skin: Negative.  Negative for itching and rash.  Neurological: Negative for dizziness, tremors, sensory change, speech change, focal weakness, weakness and headaches.  Endo/Heme/Allergies: Bruises/bleeds easily (on Xarelto).       Diabetes.  Psychiatric/Behavioral: Negative.  Negative for depression and memory loss. The patient is not nervous/anxious and does not have insomnia.   All other systems reviewed and are negative.  Performance status (ECOG):  1  Blood pressure (!) 146/76, pulse 65, temperature 97.9 F (36.6 C), temperature source Tympanic, resp. rate 16, weight 216 lb 9.6 oz (98.2 kg), SpO2 98 %.   Physical Exam  Constitutional: He is oriented to person, place, and time. He appears well-developed and well-nourished. No distress.  HENT:  Head: Normocephalic and atraumatic.  Mouth/Throat: Oropharynx is clear and moist. No oropharyngeal exudate.  Gray hair.  Male pattern baldness. Mask.  Eyes: Pupils are equal, round, and reactive to light. Conjunctivae and EOM are normal. No scleral icterus.  Blue eyes.   Neck: Normal range  of motion. Neck supple. No JVD present.  Cardiovascular: Normal rate, regular rhythm and normal heart sounds.  No murmur heard. Pulmonary/Chest: Effort normal and breath sounds normal. No respiratory distress. He has no wheezes. He has no rales.  Abdominal: Soft. Bowel sounds are normal. He exhibits no distension and no mass. There is no abdominal tenderness. There is no rebound and no guarding.  Musculoskeletal: Normal range of motion.        General: Edema (2+ left ankle) present.  Lymphadenopathy:    He has no cervical adenopathy.    He has no axillary adenopathy.       Right: No supraclavicular adenopathy present.       Left: No supraclavicular adenopathy present.  Neurological: He is alert and oriented to person, place, and time.  Skin: Skin is warm and dry. He is not diaphoretic. No erythema.  Psychiatric: He has a normal mood and affect. His behavior is normal. Judgment and thought content normal.  Nursing note and  vitals reviewed.   Appointment on 09/01/2018  Component Date Value Ref Range Status  . Uric Acid, Serum 09/01/2018 6.1  3.7 - 8.6 mg/dL Final   Performed at [facility], [address] [place], [place] [address]  . LDH 09/01/2018 150  98 - 192 U/L Final   Performed at [facility], [address], [place], [place] [address]  . Sodium 09/01/2018 136  135 - 145 mmol/L Final  . Potassium 09/01/2018 4.6  3.5 - 5.1 mmol/L Final  . Chloride 09/01/2018 105  98 - 111 mmol/L Final  . CO2 09/01/2018 24  22 - 32 mmol/L Final  . Glucose, Bld 09/01/2018 207* 70 - 99 mg/dL Final  . BUN 09/01/2018 30* 8 - 23 mg/dL Final  . Creatinine, Ser 09/01/2018 1.46* 0.61 - 1.24 mg/dL Final  . Calcium 09/01/2018 9.0  8.9 - 10.3 mg/dL Final  . Total Protein 09/01/2018 6.9  6.5 - 8.1 g/dL Final  . Albumin 09/01/2018 3.7  3.5 - 5.0 g/dL Final  . AST 09/01/2018 28  15 - 41 U/L Final  . ALT 09/01/2018 53* 0 - 44 U/L Final  . Alkaline Phosphatase 09/01/2018 49  38 -  126 U/L Final  . Total Bilirubin 09/01/2018 0.8  0.3 - 1.2 mg/dL Final  . GFR calc non Af Amer 09/01/2018 43* >60 mL/min Final  . GFR calc Af Amer 09/01/2018 50* >60 mL/min Final  . Anion gap 09/01/2018 7  5 - 15 Final   Performed at [facility], [address] [place], [place] [address]  . WBC 09/01/2018 8.5  4.0 - 10.5 K/uL Final  . RBC 09/01/2018 4.70  4.22 - 5.81 MIL/uL Final  . Hemoglobin 09/01/2018 14.0  13.0 - 17.0 g/dL Final  . HCT 09/01/2018 42.6  39.0 - 52.0 % Final  . MCV 09/01/2018 90.6  80.0 - 100.0 fL Final  . MCH 09/01/2018 29.8  26.0 - 34.0 pg Final  . MCHC 09/01/2018 32.9  30.0 - 36.0 g/dL Final  . RDW 09/01/2018 15.0  11.5 - 15.5 % Final  . Platelets 09/01/2018 171  150 - 400 K/uL Final  . nRBC 09/01/2018 0.0  0.0 - 0.2 % Final  . Neutrophils Relative % 09/01/2018 64  % Final  . Neutro Abs 09/01/2018 5.5  1.7 - 7.7 K/uL Final  . Lymphocytes Relative 09/01/2018 23  % Final  . Lymphs Abs 09/01/2018 1.9  0.7 - 4.0 K/uL Final  . Monocytes Relative 09/01/2018 8  % Final  . Monocytes Absolute 09/01/2018 0.7  0.1 - 1.0 K/uL Final  . Eosinophils Relative 09/01/2018 4  % Final  . Eosinophils Absolute 09/01/2018 0.3  0.0 - 0.5 K/uL Final  . Basophils Relative 09/01/2018 1  % Final  . Basophils Absolute 09/01/2018 0.1  0.0 - 0.1 K/uL Final  . Immature Granulocytes 09/01/2018 0  % Final  . Abs Immature Granulocytes 09/01/2018 0.03  0.00 - 0.07 K/uL Final   Performed at [facility], [address] [place], [place] [address]    Assessment:  Hayden Martin is a 86 y.o. 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  He presented with a 30-35 pound weight loss in 2-3 months.  CT-guided retroperitoneal lymph node biopsyon 01/21/2016 revealed a CD30 positive large B-cell lymphoma. Immunohistochemical studies were positive for CD20, CD30, BCL-2, BCL 6, Ki-67 (>90%) and negative for CD10 and cyclin D1.  Bone marrow biopsyon 01/21/2016  revealed no  evidence of lymphoma. Flow cytometry was negative. Cytogenetics were normal (46, XY).  Abdomen and pelvic CTscan on 01/07/2016 revealed multiple solid masses within the spleen. There was extensive retroperitoneal, portal and mesenteric lymphadenopathy. Retrocrural node measured 2.1 x 2.0 cm. Periportal node measured 2.6 x 5.8 cm. Confluent nodal mass is in the retroperitoneum surrounding the great vessels measured 5.6 x 3.8 cmon the left and 5.3 x 2.9 cm on the right. Left iliac node measured 1.7 x 2.6 cm. There was possible pleural disease on the left. The prostate was markedly enlarged. There was no disease in the liver.  PET scan on 01/16/2016 revealed extensive hypermetabolic adenopathy within the neck, chest, abdomen and pelvis. The spleen was enlarged with multi focal hypermetabolic splenic lesions There was evidence of transperitoneal spread of tumor within the upper abdomen. There were bilateral pleural effusions.  Hepatitis serologieswere negative on 01/16/2016. G6PD testingnegative. Echoon 01/18/2016 revealed an EF of 55-60%.   He received 6 cycles of mini-RCHOP(01/23/2016 - 05/16/2016). Cycle #1 was complicated bytumor lysis syndrome, leukopenia, and symptomatic pleural effusions.  Diagnostic and 1 liter therapeutic left sided thoracentesis on 02/08/2016 revealed involvement with lymphoma.  He received OnPro Neulasta with cycle #2 - #6.  PET scan on 04/23/2016 revealed an excellent response to treatment. The extensive adenopathy seen on the prior PET-CT showedremarkable improvement. There was noresidual metabolically active lymphoma is identified. There was a moderate left pleural effusion and small right pleural effusion.  PET scan on 06/06/2016 revealed continued improvement and near resolution. There was only minimal retroperitoneal nodularity/adenopathy, much of which likely represents a residuum from previously treated lymphoma.  The residual  periaortic lymph node measured 1.2 cm in short axis with maximum SUV 2.2 (formerly 1.4 cm and 3.6, respectively). The minimal residual nodal activity was Deauville 3.  He has a history of left lower extremity thrombosis.  He has had 2 clots 1 year apart and is on life long anticoagulation.  Bilateral lower extremity duplex on 01/16/2016 revealed DVT in the left lower extremity involving the calf veins and the left popliteal vein.  IVC filterwas placed on 01/18/2016.  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  There was interval resolution of the left calf vein DVT.  He is on Xarelto.  He has chronic renal insufficiency(Cr 1.3- 1.94). Creatinine is 1.46 today.  Chest CT angiogramon 02/02/2016 revealed no evidence of pulmonary emboli. There were bilateral pleural effusions(moderate size left and small to moderate right).  Symptomatically, he is doing well.  He denies any fever sweats or weight loss.  Exam reveals no adenopathy or hepatosplenomegaly.  He has some mild left lower ankle edema.  Plan: 1.   Labs today: CBC with diff, CMP, LDH, uric acid. 2.   Stage IIIB diffuse large B cell lymphoma             Clinically, he continues to do well.    Exam reveals no adenopathy or hepatosplenomegaly.    CBC and LDH are normal.             Continue surveillance. 3.   Left lower extremity thrombosis             Patient remains on Xarelto.             IVC filter remains in place.  Exam reveals left ankle edema.  Discuss use of support stockings during the day and off at night. 4.  Hyperuricemia             Uric acid is 6.1 (normal).             Continue allopurinol 5.   RTC in 4 months for MD assessment and labs (CBC with diff, CMP, LDH, uric acid).   I discussed the assessment and treatment plan with the patient.  The patient was provided an opportunity to ask questions and  all were answered.  The patient agreed with the plan and demonstrated an understanding of the instructions.  The patient was advised to call back if the symptoms worsen or if the condition fails to improve as anticipated.  I provided 15 minutes of face-to-face time during this this encounter and > 50% was spent counseling as documented under my assessment and plan.    Lequita Asal, MD, PhD    09/01/2018, 1:49 PM  I, Molly Dorshimer, am acting as Education administrator for [employer]. Mike Gip, MD, PhD.  I,  C. Mike Gip, MD, have reviewed the above documentation for accuracy and completeness, and I agree with the above.

## 2018-08-31 ENCOUNTER — Telehealth: Payer: Self-pay | Admitting: Hematology and Oncology

## 2018-08-31 ENCOUNTER — Other Ambulatory Visit: Payer: Self-pay

## 2018-08-31 NOTE — Telephone Encounter (Signed)
Spoke with pt to confirm appt date/time, do pre-appt screen which was completed, and adv of Covid-19 guidelines for appt regarding screening questions, temperature check, face mask required, and no visitors allowed °

## 2018-09-01 ENCOUNTER — Inpatient Hospital Stay: Payer: Medicare Other | Attending: Hematology and Oncology

## 2018-09-01 ENCOUNTER — Inpatient Hospital Stay (HOSPITAL_BASED_OUTPATIENT_CLINIC_OR_DEPARTMENT_OTHER): Payer: Medicare Other | Admitting: Hematology and Oncology

## 2018-09-01 ENCOUNTER — Encounter: Payer: Self-pay | Admitting: Hematology and Oncology

## 2018-09-01 VITALS — BP 146/76 | HR 65 | Temp 97.9°F | Resp 16 | Wt 216.6 lb

## 2018-09-01 DIAGNOSIS — E1122 Type 2 diabetes mellitus with diabetic chronic kidney disease: Secondary | ICD-10-CM | POA: Diagnosis not present

## 2018-09-01 DIAGNOSIS — I129 Hypertensive chronic kidney disease with stage 1 through stage 4 chronic kidney disease, or unspecified chronic kidney disease: Secondary | ICD-10-CM | POA: Insufficient documentation

## 2018-09-01 DIAGNOSIS — R634 Abnormal weight loss: Secondary | ICD-10-CM

## 2018-09-01 DIAGNOSIS — E790 Hyperuricemia without signs of inflammatory arthritis and tophaceous disease: Secondary | ICD-10-CM

## 2018-09-01 DIAGNOSIS — R600 Localized edema: Secondary | ICD-10-CM | POA: Diagnosis not present

## 2018-09-01 DIAGNOSIS — Z794 Long term (current) use of insulin: Secondary | ICD-10-CM | POA: Diagnosis not present

## 2018-09-01 DIAGNOSIS — Z7982 Long term (current) use of aspirin: Secondary | ICD-10-CM | POA: Insufficient documentation

## 2018-09-01 DIAGNOSIS — I82532 Chronic embolism and thrombosis of left popliteal vein: Secondary | ICD-10-CM | POA: Insufficient documentation

## 2018-09-01 DIAGNOSIS — C8338 Diffuse large B-cell lymphoma, lymph nodes of multiple sites: Secondary | ICD-10-CM | POA: Insufficient documentation

## 2018-09-01 DIAGNOSIS — I82432 Acute embolism and thrombosis of left popliteal vein: Secondary | ICD-10-CM | POA: Diagnosis not present

## 2018-09-01 DIAGNOSIS — Z79899 Other long term (current) drug therapy: Secondary | ICD-10-CM | POA: Diagnosis not present

## 2018-09-01 DIAGNOSIS — Z87891 Personal history of nicotine dependence: Secondary | ICD-10-CM | POA: Insufficient documentation

## 2018-09-01 DIAGNOSIS — N189 Chronic kidney disease, unspecified: Secondary | ICD-10-CM

## 2018-09-01 LAB — CBC WITH DIFFERENTIAL/PLATELET
Abs Immature Granulocytes: 0.03 10*3/uL (ref 0.00–0.07)
Basophils Absolute: 0.1 10*3/uL (ref 0.0–0.1)
Basophils Relative: 1 %
Eosinophils Absolute: 0.3 10*3/uL (ref 0.0–0.5)
Eosinophils Relative: 4 %
HCT: 42.6 % (ref 39.0–52.0)
Hemoglobin: 14 g/dL (ref 13.0–17.0)
Immature Granulocytes: 0 %
Lymphocytes Relative: 23 %
Lymphs Abs: 1.9 10*3/uL (ref 0.7–4.0)
MCH: 29.8 pg (ref 26.0–34.0)
MCHC: 32.9 g/dL (ref 30.0–36.0)
MCV: 90.6 fL (ref 80.0–100.0)
Monocytes Absolute: 0.7 10*3/uL (ref 0.1–1.0)
Monocytes Relative: 8 %
Neutro Abs: 5.5 10*3/uL (ref 1.7–7.7)
Neutrophils Relative %: 64 %
Platelets: 171 10*3/uL (ref 150–400)
RBC: 4.7 MIL/uL (ref 4.22–5.81)
RDW: 15 % (ref 11.5–15.5)
WBC: 8.5 10*3/uL (ref 4.0–10.5)
nRBC: 0 % (ref 0.0–0.2)

## 2018-09-01 LAB — COMPREHENSIVE METABOLIC PANEL
ALT: 53 U/L — ABNORMAL HIGH (ref 0–44)
AST: 28 U/L (ref 15–41)
Albumin: 3.7 g/dL (ref 3.5–5.0)
Alkaline Phosphatase: 49 U/L (ref 38–126)
Anion gap: 7 (ref 5–15)
BUN: 30 mg/dL — ABNORMAL HIGH (ref 8–23)
CO2: 24 mmol/L (ref 22–32)
Calcium: 9 mg/dL (ref 8.9–10.3)
Chloride: 105 mmol/L (ref 98–111)
Creatinine, Ser: 1.46 mg/dL — ABNORMAL HIGH (ref 0.61–1.24)
GFR calc Af Amer: 50 mL/min — ABNORMAL LOW (ref >60)
GFR calc non Af Amer: 43 mL/min — ABNORMAL LOW (ref >60)
Glucose, Bld: 207 mg/dL — ABNORMAL HIGH (ref 70–99)
Potassium: 4.6 mmol/L (ref 3.5–5.1)
Sodium: 136 mmol/L (ref 135–145)
Total Bilirubin: 0.8 mg/dL (ref 0.3–1.2)
Total Protein: 6.9 g/dL (ref 6.5–8.1)

## 2018-09-01 LAB — URIC ACID: Uric Acid, Serum: 6.1 mg/dL (ref 3.7–8.6)

## 2018-09-01 LAB — LACTATE DEHYDROGENASE: LDH: 150 U/L (ref 98–192)

## 2018-09-01 NOTE — Progress Notes (Signed)
Pt here for follow up. Denies any concerns.  

## 2018-12-29 NOTE — Progress Notes (Signed)
[facility]  [address], [address] [place],  [address] Phone: [phone]  Fax: [phone]   Clinic Day:  01/04/2019  Referring physician: Kirk Ruths, MD  Chief Complaint: Hayden Martin is a 85 y.o. 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  HPI: The patient was last seen in the medical oncology clinic on 09/01/2018. At that time, he was doing well. He denied any fever sweats or weight loss. Exam revealed no adenopathy or hepatosplenomegaly. He had some mild left lower ankle edema. CBC and LDH were normal. Uric acid was 6.1 (normal).  Patient remained on Xarelto. Surveillance continued.   During the interim, he says he has been doing very well. He continues Xarelto. No new lumps or bumps. No fevers, night sweats, unintended weight loss. He says he feels well and denies complaints.   Past Medical History:  Diagnosis Date  . Cancer ([place])   . Clotting disorder ([place])   . Diabetes mellitus without complication ([place])   . Heart disease   . Hypertension     Past Surgical History:  Procedure Laterality Date  . HERNIA REPAIR    . PERIPHERAL VASCULAR CATHETERIZATION N/A 01/18/2016   Procedure: IVC Filter Insertion;  Surgeon: Katha Cabal, MD;  Location: [place] [facility];  Service: Cardiovascular;  Laterality: N/A;  . PERIPHERAL VASCULAR CATHETERIZATION N/A 01/21/2016   Procedure: Glori Luis Cath Insertion;  Surgeon: Algernon Huxley, MD;  Location: [place] [facility];  Service: Cardiovascular;  Laterality: N/A;  . PORTA CATH REMOVAL N/A 01/07/2018   Procedure: PORTA CATH REMOVAL;  Surgeon: Algernon Huxley, MD;  Location: [place] [facility];  Service: Cardiovascular;  Laterality: N/A;    History reviewed. No pertinent family history.  Social History:  reports that he quit smoking about 33 years ago. He smoked 1.50 packs per day. He has quit using  smokeless tobacco. No history on file for alcohol and drug. He was in the [employer]. The patient is a retired Engineer, structural.He lives by himself.He was discharged from [facility] on 02/20/2016. He has 3 children who live locally. He recently bought a new car [facility]).He describes recently being a passenger in a [employer] airplane. He likes to play poker. The patient is unaccompanied today.   Allergies:  Allergies  Allergen Reactions  . Codeine Itching  . Penicillins Itching    Current Medications: Current Outpatient Medications  Medication Sig Dispense Refill  . allopurinol (ZYLOPRIM) 100 MG tablet Take 1 tablet (100 mg total) by mouth daily. 90 tablet 3  . amLODipine (NORVASC) 5 MG tablet Take 10 mg by mouth daily.     [name] aspirin EC 81 MG tablet Take 81 mg by mouth daily.    [name] atorvastatin (LIPITOR) 40 MG tablet Take 20 mg by mouth daily.    . carvedilol (COREG) 6.25 MG tablet TAKE ONE (1) TABLET BY MOUTH TWO (2) TIMES DAILY    . furosemide (LASIX) 20 MG tablet Take 20 mg by mouth daily.     . Insulin Glargine (LANTUS Baird) Inject into the skin. Inject 32 units subcutaneously at bedtime.    [name] ketoconazole (NIZORAL) 2 % cream APPLY AT BEDTIME DAILY TO AFFECTED AREAS    . liraglutide (VICTOZA) 18 MG/3ML SOPN Inject into the skin daily.    . rivaroxaban (XARELTO) 20 MG TABS tablet Take 20 mg by mouth daily with  supper.     No current facility-administered medications for this visit.    Facility-Administered Medications Ordered in Other Visits  Medication Dose Route Frequency Provider Last Rate Last Dose  . sodium chloride flush (NS) 0.9 % injection 10 mL  10 mL Intravenous PRN Cammie Sickle, MD   10 mL at 03/10/16 0949    Review of Systems  Constitutional: Positive for weight loss (4 lbs- bmi 29.69). Negative for chills, diaphoresis, fever and malaise/fatigue.       Doing "very well."  HENT: Positive for hearing loss (hearing aid). Negative for congestion, ear pain,  nosebleeds, sinus pain and sore throat.   Eyes: Negative.  Negative for blurred vision, double vision and photophobia.  Respiratory: Negative.  Negative for cough, hemoptysis, sputum production and shortness of breath.   Cardiovascular: Positive for leg swelling (left foot- chronic). Negative for chest pain, palpitations, orthopnea and PND.  Gastrointestinal: Negative.  Negative for abdominal pain, blood in stool, constipation, diarrhea, heartburn, melena, nausea and vomiting.  Genitourinary: Negative.  Negative for dysuria, frequency, hematuria and urgency.  Musculoskeletal: Negative for back pain (Chronic back pain- doesn't bother him today), falls, joint pain, myalgias and neck pain.       History of DVT.  Chronic leg cramps at night.  Skin: Negative.  Negative for itching and rash.  Neurological: Negative for dizziness, tremors, sensory change, speech change, focal weakness, weakness and headaches.  Endo/Heme/Allergies: Bruises/bleeds easily (on Xarelto- chronic; not worse).       Diabetes.  Psychiatric/Behavioral: Negative.  Negative for depression and memory loss. The patient is not nervous/anxious and does not have insomnia.   All other systems reviewed and are negative.   Performance status (ECOG): 1  Vitals Blood pressure 136/71, pulse (!) 53, temperature (!) 97.2 F (36.2 C), temperature source Tympanic, resp. rate 18, height _0  (1.803 m), weight 212 lb 13.7 oz (96.6 kg), SpO2 98 %.   Physical Exam  Constitutional: He is oriented to person, place, and time. He appears well-developed and well-nourished. No distress.  Unaccompanied. Wearing mask  HENT:  Head: Normocephalic and atraumatic.  Mouth/Throat: Oropharynx is clear and moist. No oropharyngeal exudate.  Gray hair.  Male pattern baldness. Mask.  Eyes: Conjunctivae and EOM are normal. No scleral icterus.  Blue eyes.   Neck: Normal range of motion. Neck supple. No JVD present.  Cardiovascular: Normal rate, regular  rhythm and normal heart sounds.  No murmur heard. Pulmonary/Chest: Effort normal and breath sounds normal. No respiratory distress. He has no wheezes. He has no rales.  Abdominal: Soft. Bowel sounds are normal. He exhibits no distension. There is no abdominal tenderness. There is no rebound and no guarding.  Musculoskeletal: Normal range of motion.        General: Edema (2+ left ankle) present.  Lymphadenopathy:    He has no cervical adenopathy.    He has no axillary adenopathy.       Right: No supraclavicular adenopathy present.       Left: No supraclavicular adenopathy present.  Neurological: He is alert and oriented to person, place, and time.  Skin: Skin is warm and dry. He is not diaphoretic. No erythema.  Psychiatric: He has a normal mood and affect. His behavior is normal. Judgment and thought content normal.  Nursing note and vitals reviewed.    Appointment on 01/04/2019  Component Date Value Ref Range Status  . Uric Acid, Serum 01/04/2019 6.7  3.7 - 8.6 mg/dL Final   Performed at [facility]  [facility], [address], [place], [place] [address]  . LDH 01/04/2019 144  98 - 192 U/L Final   Performed at [facility], [address], [place], [place] [address]  . Sodium 01/04/2019 137  135 - 145 mmol/L Final  . Potassium 01/04/2019 4.5  3.5 - 5.1 mmol/L Final  . Chloride 01/04/2019 103  98 - 111 mmol/L Final  . CO2 01/04/2019 26  22 - 32 mmol/L Final  . Glucose, Bld 01/04/2019 182* 70 - 99 mg/dL Final  . BUN 01/04/2019 33* 8 - 23 mg/dL Final  . Creatinine, Ser 01/04/2019 1.85* 0.61 - 1.24 mg/dL Final  . Calcium 01/04/2019 8.7* 8.9 - 10.3 mg/dL Final  . Total Protein 01/04/2019 7.4  6.5 - 8.1 g/dL Final  . Albumin 01/04/2019 3.3* 3.5 - 5.0 g/dL Final  . AST 01/04/2019 19  15 - 41 U/L Final  . ALT 01/04/2019 26  0 - 44 U/L Final  . Alkaline Phosphatase 01/04/2019 56  38 - 126 U/L Final  . Total Bilirubin 01/04/2019 0.4  0.3 - 1.2 mg/dL Final  . GFR calc non  Af Amer 01/04/2019 32* >60 mL/min Final  . GFR calc Af Amer 01/04/2019 38* >60 mL/min Final  . Anion gap 01/04/2019 8  5 - 15 Final   Performed at [facility], [address], [place], [place] [address]  . WBC 01/04/2019 9.3  4.0 - 10.5 K/uL Final  . RBC 01/04/2019 4.46  4.22 - 5.81 MIL/uL Final  . Hemoglobin 01/04/2019 12.9* 13.0 - 17.0 g/dL Final  . HCT 01/04/2019 39.3  39.0 - 52.0 % Final  . MCV 01/04/2019 88.1  80.0 - 100.0 fL Final  . MCH 01/04/2019 28.9  26.0 - 34.0 pg Final  . MCHC 01/04/2019 32.8  30.0 - 36.0 g/dL Final  . RDW 01/04/2019 14.5  11.5 - 15.5 % Final  . Platelets 01/04/2019 198  150 - 400 K/uL Final  . nRBC 01/04/2019 0.0  0.0 - 0.2 % Final  . Neutrophils Relative % 01/04/2019 66  % Final  . Neutro Abs 01/04/2019 6.3  1.7 - 7.7 K/uL Final  . Lymphocytes Relative 01/04/2019 17  % Final  . Lymphs Abs 01/04/2019 1.5  0.7 - 4.0 K/uL Final  . Monocytes Relative 01/04/2019 11  % Final  . Monocytes Absolute 01/04/2019 1.0  0.1 - 1.0 K/uL Final  . Eosinophils Relative 01/04/2019 4  % Final  . Eosinophils Absolute 01/04/2019 0.4  0.0 - 0.5 K/uL Final  . Basophils Relative 01/04/2019 1  % Final  . Basophils Absolute 01/04/2019 0.1  0.0 - 0.1 K/uL Final  . Immature Granulocytes 01/04/2019 1  % Final  . Abs Immature Granulocytes 01/04/2019 0.05  0.00 - 0.07 K/uL Final   Performed at [facility], [address], [place], [place] [address]    Assessment:  Hayden Martin is a 85 y.o. 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 He initially presented with a 30-35 pound weight loss in 2-3 months.  CT-guided retroperitoneal lymph node biopsyon 01/21/2016 revealed a CD30 positive large B-cell lymphoma. Immunohistochemical studies were positive for CD20, CD30, BCL-2, BCL 6, Ki-67 (>90%) and negative for CD10 and cyclin D1.  Bone marrow biopsyon 01/21/2016 revealed no evidence of lymphoma. Flow cytometry was negative. Cytogenetics were  normal (46, XY).  Abdomen and pelvic CTscan on 01/07/2016 revealed multiple solid masses within the spleen. There was extensive retroperitoneal, portal and mesenteric lymphadenopathy. Retrocrural  node measured 2.1 x 2.0 cm. Periportal node measured 2.6 x 5.8 cm. Confluent nodal mass is in the retroperitoneum surrounding the great vessels measured 5.6 x 3.8 cmon the left and 5.3 x 2.9 cm on the right. Left iliac node measured 1.7 x 2.6 cm. There was possible pleural disease on the left. The prostate was markedly enlarged. There was no disease in the liver.  PET scan on 01/16/2016 revealed extensive hypermetabolic adenopathy within the neck, chest, abdomen and pelvis. The spleen was enlarged with multi focal hypermetabolic splenic lesions There was evidence of transperitoneal spread of tumor within the upper abdomen. There were bilateral pleural effusions.  Hepatitis serologieswere negative on 01/16/2016. G6PD testingnegative. Echoon 01/18/2016 revealed an EF of 55-60%.   He received 6 cycles of mini-RCHOP(01/23/2016 - 05/16/2016). Cycle #1 was complicated bytumor lysis syndrome, leukopenia, and symptomatic pleural effusions. Diagnostic and 1 liter therapeutic left sided thoracentesison 02/08/2016 revealed involvement with lymphoma. He received OnPro Neulastawith cycle #2 - #6.  PET scanon 04/23/2016 revealed an excellent response to treatment. The extensive adenopathy seen on the prior PET-CT showedremarkable improvement. There was noresidual metabolically active lymphoma is identified. There was a moderate left pleural effusion and small right pleural effusion.  PET scanon 06/06/2016 revealed continued improvement and near resolution. There was only minimal retroperitoneal nodularity/adenopathy, much of which likely represents a residuum from previously treated lymphoma. The residual periaortic lymph node measured 1.2 cm in short axis with maximum SUV 2.2 (formerly 1.4  cm and 3.6, respectively). The minimal residual nodal activity was Deauville 3.  He has a history of left lower extremity thrombosis. He has had 2 clots 1 year apart and is on life long anticoagulation. Bilateral lower extremity duplexon 01/16/2016 revealed DVT in the left lower extremity involving the calf veins and the left popliteal vein. IVC filterwas placed on 01/18/2016. 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 There was interval resolution of the left calf vein DVT. He is on Xarelto.  He has chronic renal insufficiency(Cr 1.3- 1.94).Creatinine is 1.85 today.  Chest CT angiogramon 02/02/2016 revealed no evidence of pulmonary emboli. There were bilateral pleural effusions(moderate size left and small to moderate right).  Symptomatically, he feels at baseline and denies B symptoms, unintended weight loss, or new lumps and/or bumps.   Plan: 1.    Labs today: CBC with diff, CMP, LDH, uric acid.  2.   Stage IIIB diffuse large B cell lymphoma  Clinically, doing well  No adenopathy or hepatosplenomegaly  CBC and LDH are normal  Discussed and recommended continued surveillance which she agrees with. 3. Left lower extremity thrombosis  On Xarelto with IVC filter in place  Chronic left ankle edema  Question dependent edema given history  Improved with support stockings during the day.  Continue  Continue Xarelto 4.Hyperuricemia Uric acid is 6.7 (normal)  Continue allopurinol. Refill provided today.  5. Elevated Serum Creatinine  Creanine elevated today at 1.85.   Patient hasn't seen nephrology.   Hx of hptn & diabetes; question is secondary  Discussed with patient who agrees with referral to nephrology for evaluation Disposition Referral to nephrology RTC in 4 months for labs (cbc, cmp, ldh, uric acid) and  follow-up with MD   I discussed the assessment and treatment plan with the patient.  The patient was provided an opportunity to ask questions and all were answered.  The patient agreed with the plan and demonstrated an understanding of  the instructions.  The patient was advised to call back if the symptoms worsen or if the condition fails to improve as anticipated.  I provided 15 minutes of face-to-face time during this this encounter and > 50% was spent counseling as documented under my assessment and plan.   Beckey Rutter, DNP, AGNP-C [place] at [facility] [phone] (clinic)   CC: Dr. Mike Gip

## 2019-01-03 ENCOUNTER — Encounter: Payer: Self-pay | Admitting: Nurse Practitioner

## 2019-01-03 NOTE — Progress Notes (Signed)
No new changes noted today. The patient Name and DOB has been verified by phone today. 

## 2019-01-04 ENCOUNTER — Other Ambulatory Visit: Payer: Self-pay

## 2019-01-04 ENCOUNTER — Encounter: Payer: Self-pay | Admitting: Nurse Practitioner

## 2019-01-04 ENCOUNTER — Telehealth: Payer: Self-pay

## 2019-01-04 ENCOUNTER — Inpatient Hospital Stay: Payer: Medicare Other | Attending: Hematology and Oncology

## 2019-01-04 ENCOUNTER — Inpatient Hospital Stay (HOSPITAL_BASED_OUTPATIENT_CLINIC_OR_DEPARTMENT_OTHER): Payer: Medicare Other | Admitting: Nurse Practitioner

## 2019-01-04 VITALS — BP 136/71 | HR 53 | Temp 97.2°F | Resp 18 | Ht 71.0 in | Wt 212.9 lb

## 2019-01-04 DIAGNOSIS — Z7982 Long term (current) use of aspirin: Secondary | ICD-10-CM | POA: Diagnosis not present

## 2019-01-04 DIAGNOSIS — Z87891 Personal history of nicotine dependence: Secondary | ICD-10-CM | POA: Diagnosis not present

## 2019-01-04 DIAGNOSIS — E1122 Type 2 diabetes mellitus with diabetic chronic kidney disease: Secondary | ICD-10-CM | POA: Insufficient documentation

## 2019-01-04 DIAGNOSIS — N189 Chronic kidney disease, unspecified: Secondary | ICD-10-CM | POA: Diagnosis not present

## 2019-01-04 DIAGNOSIS — N183 Chronic kidney disease, stage 3 unspecified: Secondary | ICD-10-CM

## 2019-01-04 DIAGNOSIS — Z86718 Personal history of other venous thrombosis and embolism: Secondary | ICD-10-CM | POA: Diagnosis not present

## 2019-01-04 DIAGNOSIS — I129 Hypertensive chronic kidney disease with stage 1 through stage 4 chronic kidney disease, or unspecified chronic kidney disease: Secondary | ICD-10-CM | POA: Diagnosis not present

## 2019-01-04 DIAGNOSIS — E790 Hyperuricemia without signs of inflammatory arthritis and tophaceous disease: Secondary | ICD-10-CM | POA: Diagnosis not present

## 2019-01-04 DIAGNOSIS — Z7901 Long term (current) use of anticoagulants: Secondary | ICD-10-CM | POA: Diagnosis not present

## 2019-01-04 DIAGNOSIS — I82432 Acute embolism and thrombosis of left popliteal vein: Secondary | ICD-10-CM

## 2019-01-04 DIAGNOSIS — Z79899 Other long term (current) drug therapy: Secondary | ICD-10-CM | POA: Diagnosis not present

## 2019-01-04 DIAGNOSIS — R600 Localized edema: Secondary | ICD-10-CM | POA: Diagnosis not present

## 2019-01-04 DIAGNOSIS — R634 Abnormal weight loss: Secondary | ICD-10-CM | POA: Insufficient documentation

## 2019-01-04 DIAGNOSIS — C8338 Diffuse large B-cell lymphoma, lymph nodes of multiple sites: Secondary | ICD-10-CM | POA: Insufficient documentation

## 2019-01-04 LAB — CBC WITH DIFFERENTIAL/PLATELET
Abs Immature Granulocytes: 0.05 10*3/uL (ref 0.00–0.07)
Basophils Absolute: 0.1 10*3/uL (ref 0.0–0.1)
Basophils Relative: 1 %
Eosinophils Absolute: 0.4 10*3/uL (ref 0.0–0.5)
Eosinophils Relative: 4 %
HCT: 39.3 % (ref 39.0–52.0)
Hemoglobin: 12.9 g/dL — ABNORMAL LOW (ref 13.0–17.0)
Immature Granulocytes: 1 %
Lymphocytes Relative: 17 %
Lymphs Abs: 1.5 10*3/uL (ref 0.7–4.0)
MCH: 28.9 pg (ref 26.0–34.0)
MCHC: 32.8 g/dL (ref 30.0–36.0)
MCV: 88.1 fL (ref 80.0–100.0)
Monocytes Absolute: 1 10*3/uL (ref 0.1–1.0)
Monocytes Relative: 11 %
Neutro Abs: 6.3 10*3/uL (ref 1.7–7.7)
Neutrophils Relative %: 66 %
Platelets: 198 10*3/uL (ref 150–400)
RBC: 4.46 MIL/uL (ref 4.22–5.81)
RDW: 14.5 % (ref 11.5–15.5)
WBC: 9.3 10*3/uL (ref 4.0–10.5)
nRBC: 0 % (ref 0.0–0.2)

## 2019-01-04 LAB — COMPREHENSIVE METABOLIC PANEL
ALT: 26 U/L (ref 0–44)
AST: 19 U/L (ref 15–41)
Albumin: 3.3 g/dL — ABNORMAL LOW (ref 3.5–5.0)
Alkaline Phosphatase: 56 U/L (ref 38–126)
Anion gap: 8 (ref 5–15)
BUN: 33 mg/dL — ABNORMAL HIGH (ref 8–23)
CO2: 26 mmol/L (ref 22–32)
Calcium: 8.7 mg/dL — ABNORMAL LOW (ref 8.9–10.3)
Chloride: 103 mmol/L (ref 98–111)
Creatinine, Ser: 1.85 mg/dL — ABNORMAL HIGH (ref 0.61–1.24)
GFR calc Af Amer: 38 mL/min — ABNORMAL LOW (ref >60)
GFR calc non Af Amer: 32 mL/min — ABNORMAL LOW (ref >60)
Glucose, Bld: 182 mg/dL — ABNORMAL HIGH (ref 70–99)
Potassium: 4.5 mmol/L (ref 3.5–5.1)
Sodium: 137 mmol/L (ref 135–145)
Total Bilirubin: 0.4 mg/dL (ref 0.3–1.2)
Total Protein: 7.4 g/dL (ref 6.5–8.1)

## 2019-01-04 LAB — URIC ACID: Uric Acid, Serum: 6.7 mg/dL (ref 3.7–8.6)

## 2019-01-04 LAB — LACTATE DEHYDROGENASE: LDH: 144 U/L (ref 98–192)

## 2019-01-04 MED ORDER — ALLOPURINOL 100 MG PO TABS: 100.0000 mg | ORAL_TABLET | Freq: Every day | ORAL | 3 refills | Status: DC

## 2019-01-04 NOTE — Telephone Encounter (Signed)
Faxed over Referral to Dr Holley Raring office / fax confomation has been confirmed.

## 2019-01-05 ENCOUNTER — Other Ambulatory Visit: Payer: Medicare Other

## 2019-01-05 ENCOUNTER — Ambulatory Visit: Payer: Medicare Other | Admitting: Hematology and Oncology

## 2019-01-11 ENCOUNTER — Telehealth: Payer: Self-pay

## 2019-01-11 NOTE — Telephone Encounter (Signed)
Dr Holley Raring office has sent a fax over to the office which states the time and date for this patient visit. 11/30 at 2:00 pm. The patient has been made aware.

## 2019-01-31 ENCOUNTER — Other Ambulatory Visit: Payer: Self-pay | Admitting: Nephrology

## 2019-01-31 ENCOUNTER — Other Ambulatory Visit (HOSPITAL_COMMUNITY): Payer: Self-pay | Admitting: Nephrology

## 2019-01-31 DIAGNOSIS — N1832 Chronic kidney disease, stage 3b: Secondary | ICD-10-CM

## 2019-01-31 DIAGNOSIS — E1122 Type 2 diabetes mellitus with diabetic chronic kidney disease: Secondary | ICD-10-CM

## 2019-02-14 IMAGING — MR MR LUMBAR SPINE W/O CM
4 of 5 series · 15 of 48 positions shown · non-contrast
Comparison: None.

CLINICAL DATA: Right foot drop.  Lymphoma.

EXAM:
MRI LUMBAR SPINE WITHOUT CONTRAST
TECHNIQUE: Multiplanar, multisequence MR imaging of the lumbar spine was
performed. No intravenous contrast was administered.

[Series 3: T2 · sagittal · 4.0mm · 0.44mm/px · 6 of 15 slices shown (1 of 2)]
[im 1/15]
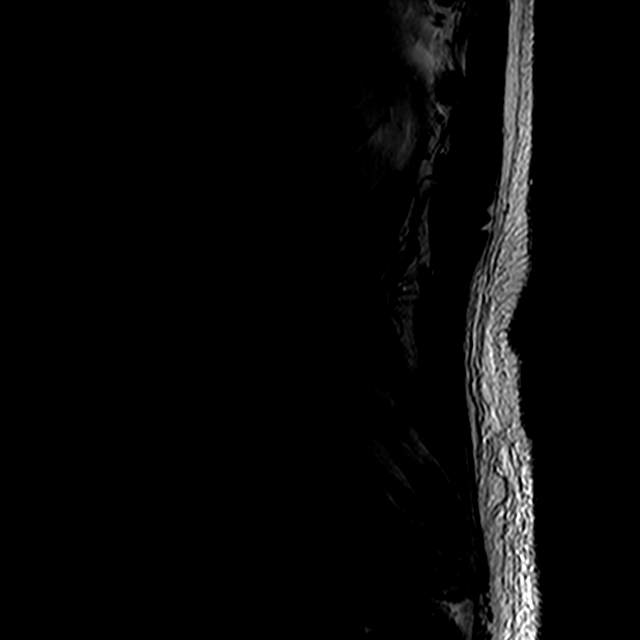
[im 3/15]
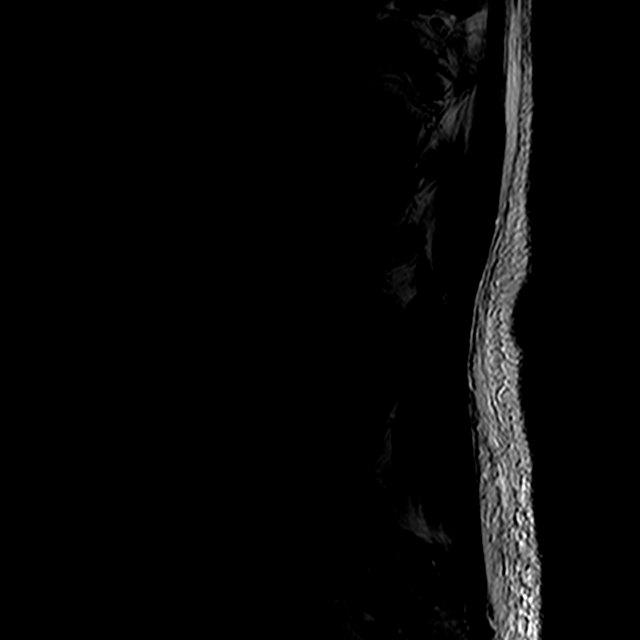
[im 6/15]
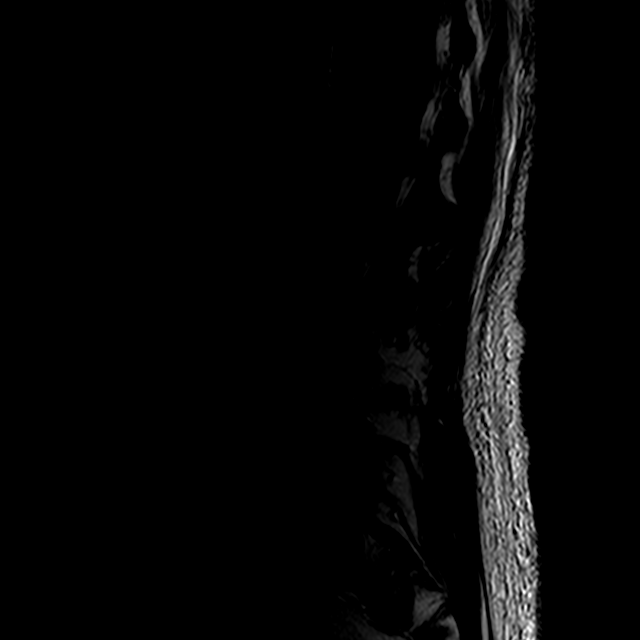
[im 9/15]
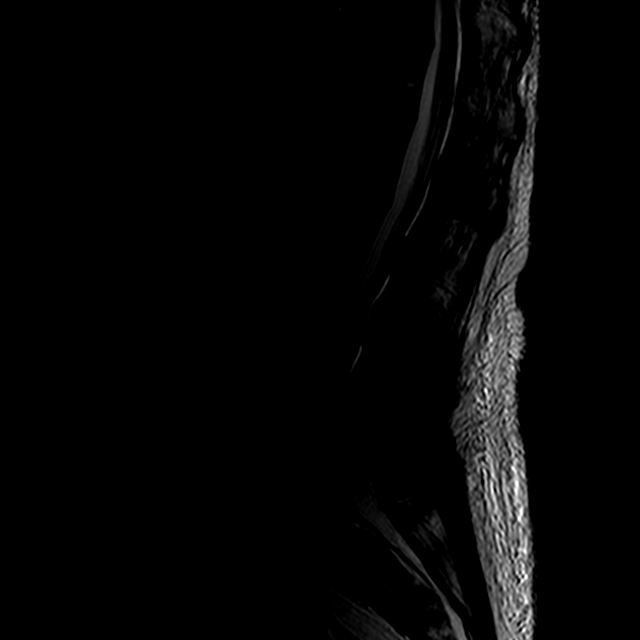
[im 12/15]
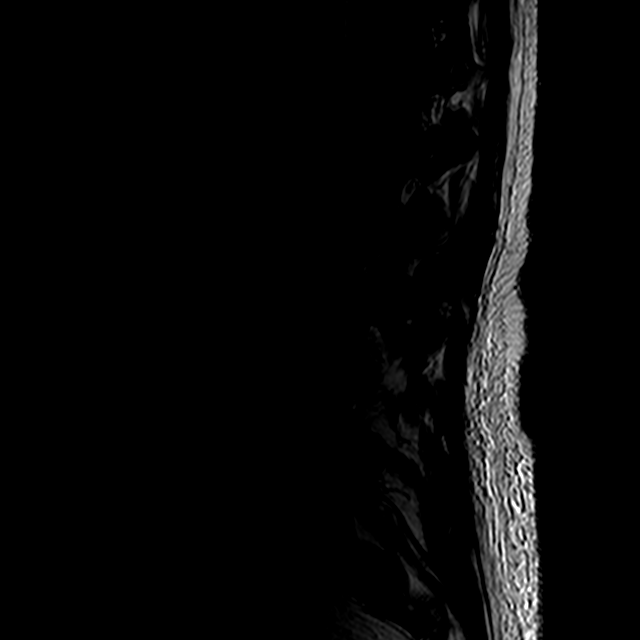
[im 15/15]
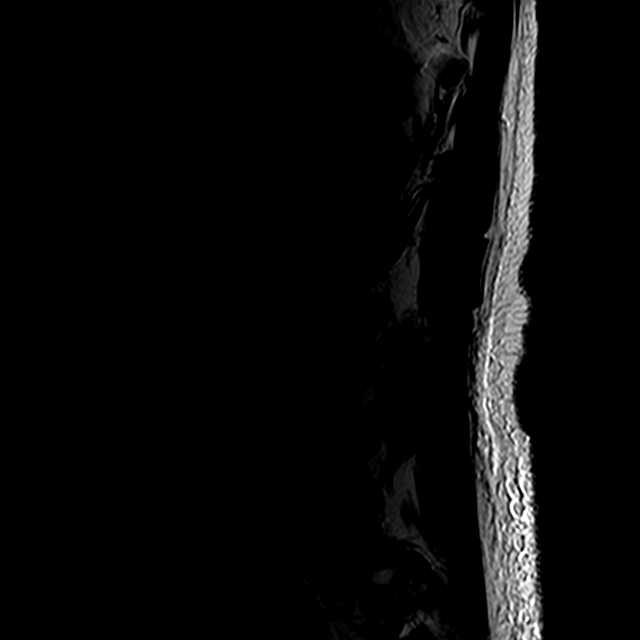

[Series 4: T1 · sagittal · 4.0mm · 0.44mm/px · 3 of 15 slices shown (1 of 2)]
[im 3/15]
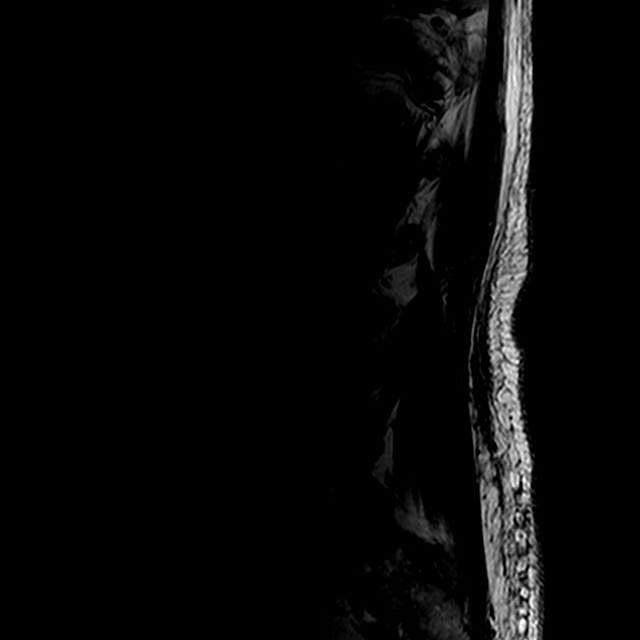
[im 9/15]
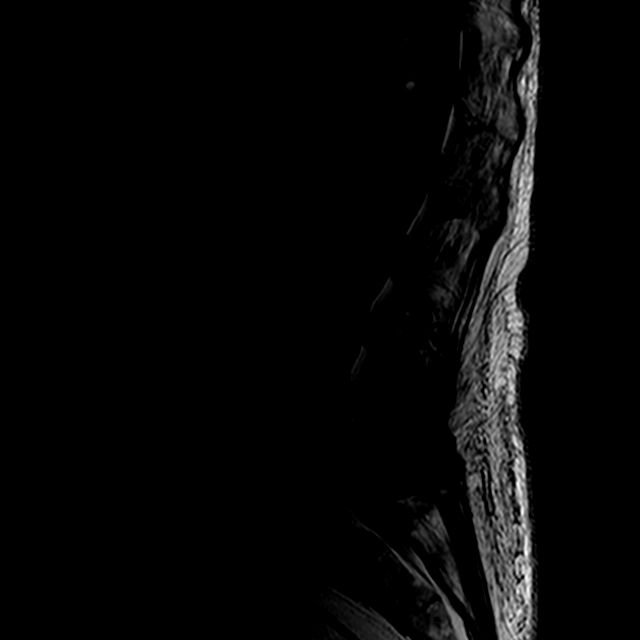
[im 15/15]
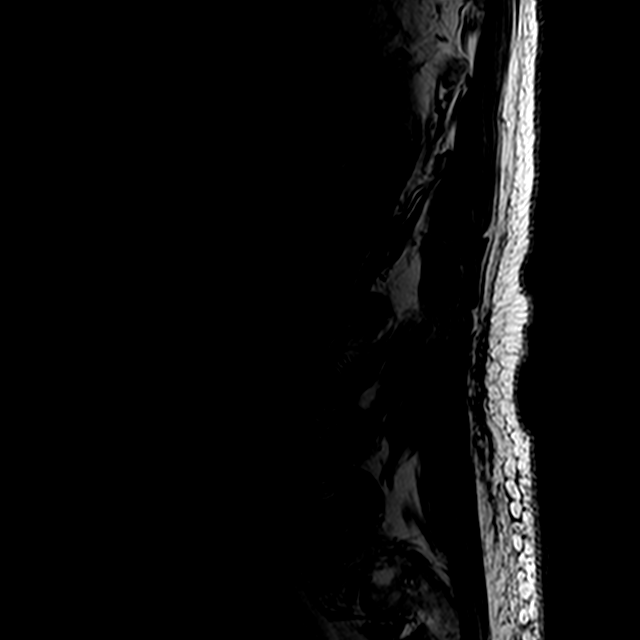

[Series 6: T2 · axial · 4.0mm · 0.39mm/px · z∈[-103,+71]mm · 3 of 37 slices shown (2 of 2)]
[im 6/37]
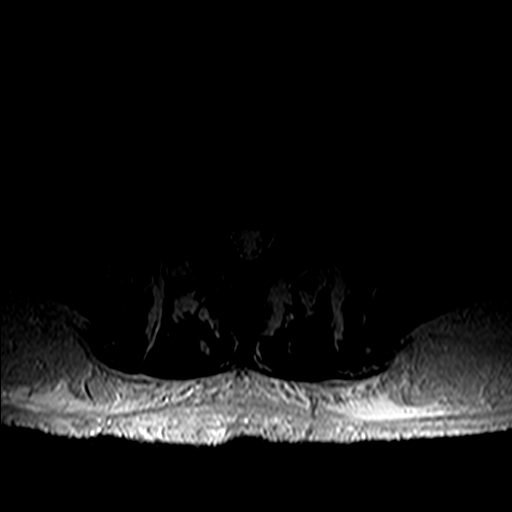
[im 19/37]
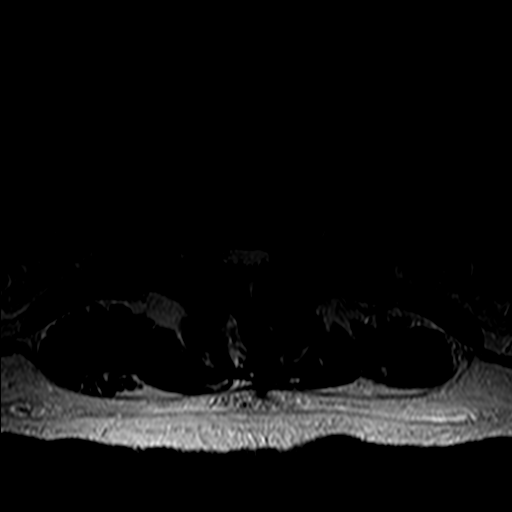
[im 31/37]
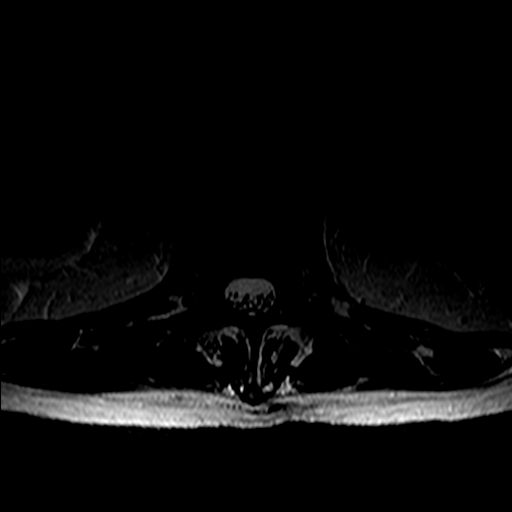

[Series 7: T1 · axial · 4.0mm · 0.39mm/px · z∈[-103,+71]mm · 3 of 37 slices shown (2 of 2)]
[im 6/37]
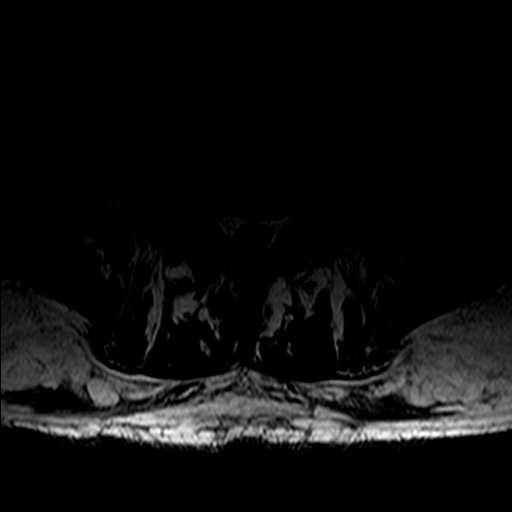
[im 19/37]
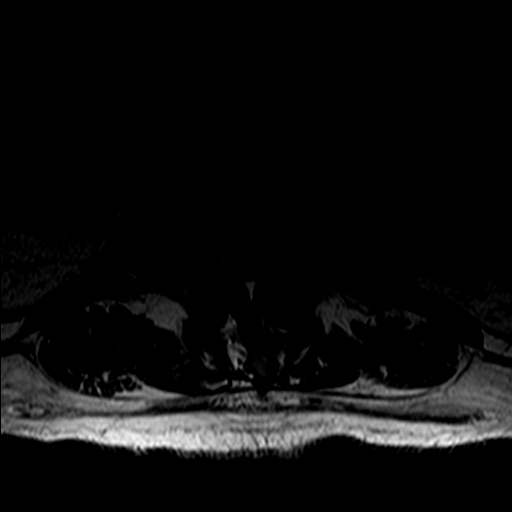
[im 31/37]
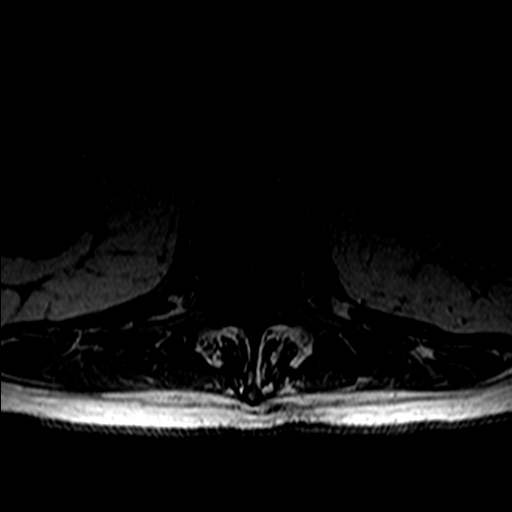

[15 of 48 positions shown; findings below may reference images not displayed]

FINDINGS: Segmentation:  Normal

Alignment:  Normal

Vertebrae: No acute compression fracture, discitis-osteomyelitis,
facet edema or other focal marrow lesion. No epidural collection.

Conus medullaris: Extends to the L1 level and appears normal.

Paraspinal and other soft tissues: Right renal sinus cysts are
present. Otherwise unremarkable.

Disc levels:

T12-L1: Normal disc space and facets. No spinal canal or
neuroforaminal stenosis.

L1-L2: Normal disc space and facets. No spinal canal or
neuroforaminal stenosis.

L2-L3: Disc desiccation with minimal bulge and mild bilateral facet
hypertrophy. No spinal canal or neural foraminal stenosis.

L3-L4: Diffuse medium-sized disc bulge with superimposed central
extrusion with minimal inferior migration. Bilateral moderate facet
hypertrophy. No spinal canal stenosis. No neural foraminal stenosis.

L4-L5: Disc desiccation with medium-sized disc bulge and small
superimposed central protrusion. Severe bilateral facet hypertrophy.
Mild spinal canal stenosis. No neural foraminal stenosis.

L5-S1: Small disc bulge and mild facet hypertrophy. No spinal canal
stenosis. Mild right neural foraminal stenosis.
IMPRESSION: 1. Mild spinal canal stenosis at L4-L5 due to combination of disc
disease and facet hypertrophy.
2. Mild right L5-S1 neural foraminal narrowing.
3. Multilevel facet arthrosis, which may be a source back pain.

## 2019-03-01 ENCOUNTER — Ambulatory Visit
Admission: RE | Admit: 2019-03-01 | Discharge: 2019-03-01 | Disposition: A | Discharge status: Home / Self Care | Payer: Medicare Other | Source: Ambulatory Visit | Attending: Nephrology | Admitting: Nephrology

## 2019-03-01 ENCOUNTER — Other Ambulatory Visit: Payer: Self-pay

## 2019-03-01 DIAGNOSIS — N1832 Chronic kidney disease, stage 3b: Secondary | ICD-10-CM | POA: Insufficient documentation

## 2019-03-01 DIAGNOSIS — E1122 Type 2 diabetes mellitus with diabetic chronic kidney disease: Secondary | ICD-10-CM | POA: Insufficient documentation

## 2019-05-02 NOTE — Progress Notes (Signed)
[facility]  [address] [place], [place] [address] Phone: [phone]  Fax: [phone]   Clinic Day:  05/04/2019  Referring physician: Kirk Ruths, MD  Chief Complaint: Hayden Martin is a 86 y.o. 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  HPI: The patient was last seen in the medical oncology clinic on 09/01/2018. At that time, he was doing well. He denied any fever sweats or weight loss. Exam revealed no adenopathy or hepatosplenomegaly.  He had some mild left lower ankle edema. Hematocrit 42.6, hemoglobin 14.0, platelets 171,00, WBC 8,500. Creatinine 1.46. ALT 53. Uric acid was 6.1. LDH was 150. He continued on Xarelto and allopurinol.   Patient was last seen in the medical oncology clinic on 01/04/2019 by Beckey Rutter, NP. At the time, he was at his baseline.  He denied B symptoms, unintended weight loss, or new lumps and/or bumps. Hematocrit 39.3, hemoglobin 12.9, platelets 198,000, WBC 9,300. Creatinine 1.85, calcium 8.7, albumin 3.3. Uric acid 6.7. LDH was 144. He remained on Xarelto and allopurinol. Patient agreed to a nephrology referral.   Patient was seen by Dr. Holley Raring on 01/31/2019. GFR was 32 ml/min.  He was felt to have chronic kidney disease.  Work-up was pursued.  CBC revealed a hematocrit of 40.6, hemoglobin 13.5, MCV 87.1, platelets 113,000, WBC 5000 ([place]).  SPEP revealed a poorly defined band of restricted protein mobility in the gamma globulins.  This was was felt unlikely to represent a monoclonal protein.  Random urine revealed no abnormal protein bands (Bence-Jones proteinuria).  Renal ultrasound on 03/01/2019 showed mild asymmetric cortical thinning about the right kidney. There was otherwise unremarkable and normal renal ultrasound. There was an enlarged prostate.  During the interim, he has been doing "pretty good". He  reports having COVID-19 back in 01/2019 along with pneumonia. He has had shortness of breath every since. He is on increased diuretics. He has had to sit up in a chair just go sleep.   He denies any B symptoms. He has no lumps or bumps. She has back pain. He remains on Xarelto. He denies any bruising or bleeding. He has bilateral leg swelling. He is using a sequential compression device through out the day.    Past Medical History:  Diagnosis Date  . Cancer ([place])   . Clotting disorder ([place])   . Diabetes mellitus without complication ([place])   . Heart disease   . Hypertension     Past Surgical History:  Procedure Laterality Date  . HERNIA REPAIR    . PERIPHERAL VASCULAR CATHETERIZATION N/A 01/18/2016   Procedure: IVC Filter Insertion;  Surgeon: Katha Cabal, MD;  Location: [facility];  Service: Cardiovascular;  Laterality: N/A;  . PERIPHERAL VASCULAR CATHETERIZATION N/A 01/21/2016   Procedure: Glori Luis Cath Insertion;  Surgeon: Algernon Huxley, MD;  Location: [facility];  Service: Cardiovascular;  Laterality: N/A;  . PORTA CATH REMOVAL N/A 01/07/2018   Procedure: PORTA CATH REMOVAL;  Surgeon: Algernon Huxley, MD;  Location: [facility];  Service: Cardiovascular;  Laterality: N/A;    History reviewed. No pertinent family history.  Social History:  reports that he quit smoking about 33 years ago. He smoked 1.50 packs per day. He has quit using smokeless tobacco. No history on file for alcohol and drug. He was in the [employer]. 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 He has 3 children who live locally. He recently bought a new car [facility]).He describes recently being a passenger in a [employer] airplane. He likes to play poker. The patient is alone today.  Allergies:  Allergies  Allergen Reactions  . Codeine Itching  . Penicillins Itching    Current Medications: Current Outpatient  Medications  Medication Sig Dispense Refill  . amLODipine (NORVASC) 5 MG tablet Take 10 mg by mouth daily.     [name] aspirin EC 81 MG tablet Take 81 mg by mouth daily.    [name] atorvastatin (LIPITOR) 40 MG tablet Take 20 mg by mouth daily.    . carvedilol (COREG) 6.25 MG tablet TAKE ONE (1) TABLET BY MOUTH TWO (2) TIMES DAILY    . furosemide (LASIX) 20 MG tablet Take 20 mg by mouth daily.     . Insulin Glargine (LANTUS [place]) Inject into the skin. Inject 36 units subcutaneously at bedtime.    [name] ketoconazole (NIZORAL) 2 % cream APPLY AT BEDTIME DAILY TO AFFECTED AREAS    . liraglutide (VICTOZA) 18 MG/3ML SOPN Inject into the skin daily.    . rivaroxaban (XARELTO) 20 MG TABS tablet Take 20 mg by mouth daily with supper.    [name] allopurinol (ZYLOPRIM) 100 MG tablet Take 1 tablet (100 mg total) by mouth daily. (Patient not taking: Reported on 05/03/2019) 90 tablet 3   No current facility-administered medications for this visit.   Facility-Administered Medications Ordered in Other Visits  Medication Dose Route Frequency Provider Last Rate Last Admin  . sodium chloride flush (NS) 0.9 % injection 10 mL  10 mL Intravenous PRN Cammie Sickle, MD   10 mL at 03/10/16 0949    Review of Systems  Constitutional: Negative for chills, diaphoresis, fever, malaise/fatigue and weight loss (stable).       Doing "pretty good".  HENT: Positive for hearing loss (hearing aid). Negative for congestion, ear pain, nosebleeds, sinus pain and sore throat.   Eyes: Negative.  Negative for blurred vision, double vision and photophobia.  Respiratory: Positive for shortness of breath. Negative for cough, hemoptysis and sputum production.   Cardiovascular: Positive for leg swelling (left foot). Negative for chest pain, palpitations, orthopnea and PND.  Gastrointestinal: Negative.  Negative for abdominal pain, blood in stool, constipation, diarrhea, heartburn, melena, nausea and vomiting.  Genitourinary: Negative.  Negative for  dysuria, frequency, hematuria and urgency.  Musculoskeletal: Positive for back pain (stiff, chronic). Negative for falls, joint pain, myalgias and neck pain.       History of DVT.  Chronic leg cramps at night.  Skin: Negative.  Negative for itching and rash.  Neurological: Negative for dizziness, tremors, sensory change, speech change, focal weakness, weakness and headaches.  Endo/Heme/Allergies: Does not bruise/bleed easily (on Xarelto).       Diabetes.  Psychiatric/Behavioral: Negative.  Negative for depression and memory loss. The patient is not nervous/anxious and does not have insomnia.   All other systems reviewed and are negative.  Performance status (ECOG): 1  Vitals Blood pressure (!) 155/58, pulse (!) 57, temperature (!) 97.1 F (36.2 C), temperature source Tympanic, resp. rate 18, weight 217 lb 0.7 oz (98.5 kg), SpO2 96 %.   Physical Exam  Constitutional: He is oriented to person, place, and time. He appears well-developed and well-nourished. No distress.  HENT:  Head: Normocephalic and atraumatic.  Mouth/Throat: Oropharynx is clear and moist. No oropharyngeal exudate.  Gray hair.  Male pattern baldness.  Hearing aid.  Mask.  Eyes: Pupils are equal, round, and reactive to light. Conjunctivae and EOM are normal. No scleral icterus.  Blue eyes.   Neck: No JVD present.  Cardiovascular: Normal rate, regular rhythm and normal heart sounds.  No murmur heard. Pulmonary/Chest: Effort normal and breath sounds normal. No respiratory distress. He has no wheezes. He has no rales.  Abdominal: Soft. Bowel sounds are normal. He exhibits no distension and no mass. There is no abdominal tenderness. There is no rebound and no guarding.  Musculoskeletal:        General: Edema (bilateral 2+ sock height edema) present. Normal range of motion.     Cervical back: Normal range of motion and neck supple.  Lymphadenopathy:       Head (right side): No preauricular, no posterior auricular and no  occipital adenopathy present.       Head (left side): No preauricular, no posterior auricular and no occipital adenopathy present.    He has no cervical adenopathy.    He has no axillary adenopathy.       Right: No inguinal and no supraclavicular adenopathy present.       Left: No inguinal and no supraclavicular adenopathy present.  Neurological: He is alert and oriented to person, place, and time.  Skin: Skin is warm and dry. He is not diaphoretic. No erythema.  Psychiatric: He has a normal mood and affect. His behavior is normal. Judgment and thought content normal.  Nursing note and vitals reviewed.   Appointment on 05/04/2019  Component Date Value Ref Range Status  . Uric Acid, Serum 05/04/2019 7.4  3.7 - 8.6 mg/dL Final   Performed at [facility], [address] [place], [place] [address]  . LDH 05/04/2019 160  98 - 192 U/L Final   Performed at [facility], [address] [place], [place] [address]  . WBC 05/04/2019 7.2  4.0 - 10.5 K/uL Final  . RBC 05/04/2019 4.84  4.22 - 5.81 MIL/uL Final  . Hemoglobin 05/04/2019 14.0  13.0 - 17.0 g/dL Final  . HCT 05/04/2019 44.3  39.0 - 52.0 % Final  . MCV 05/04/2019 91.5  80.0 - 100.0 fL Final  . MCH 05/04/2019 28.9  26.0 - 34.0 pg Final  . MCHC 05/04/2019 31.6  30.0 - 36.0 g/dL Final  . RDW 05/04/2019 16.2* 11.5 - 15.5 % Final  . Platelets 05/04/2019 131* 150 - 400 K/uL Final  . nRBC 05/04/2019 0.0  0.0 - 0.2 % Final  . Neutrophils Relative % 05/04/2019 61  % Final  . Neutro Abs 05/04/2019 4.4  1.7 - 7.7 K/uL Final  . Lymphocytes Relative 05/04/2019 25  % Final  . Lymphs Abs 05/04/2019 1.8  0.7 - 4.0 K/uL Final  . Monocytes Relative 05/04/2019 9  % Final  . Monocytes Absolute 05/04/2019 0.7  0.1 - 1.0 K/uL Final  . Eosinophils Relative 05/04/2019 4  % Final  . Eosinophils Absolute 05/04/2019 0.3  0.0 - 0.5 K/uL Final  . Basophils Relative 05/04/2019 1  % Final  . Basophils Absolute 05/04/2019 0.1  0.0 -  0.1 K/uL Final  . Immature Granulocytes 05/04/2019 0  % Final  . Abs Immature Granulocytes 05/04/2019 0.02  0.00 - 0.07 K/uL Final   Performed at [facility], [address] [place], [place] [address]  . Sodium 05/04/2019 139  135 - 145 mmol/L Final  . Potassium 05/04/2019 4.9  3.5 - 5.1 mmol/L  Final  . Chloride 05/04/2019 101  98 - 111 mmol/L Final  . CO2 05/04/2019 29  22 - 32 mmol/L Final  . Glucose, Bld 05/04/2019 102* 70 - 99 mg/dL Final   Glucose reference range applies only to samples taken after fasting for at least 8 hours.  . BUN 05/04/2019 36* 8 - 23 mg/dL Final  . Creatinine, Ser 05/04/2019 1.80* 0.61 - 1.24 mg/dL Final  . Calcium 05/04/2019 9.1  8.9 - 10.3 mg/dL Final  . Total Protein 05/04/2019 7.2  6.5 - 8.1 g/dL Final  . Albumin 05/04/2019 3.8  3.5 - 5.0 g/dL Final  . AST 05/04/2019 22  15 - 41 U/L Final  . ALT 05/04/2019 30  0 - 44 U/L Final  . Alkaline Phosphatase 05/04/2019 52  38 - 126 U/L Final  . Total Bilirubin 05/04/2019 1.0  0.3 - 1.2 mg/dL Final  . GFR calc non Af Amer 05/04/2019 34* >60 mL/min Final  . GFR calc Af Amer 05/04/2019 39* >60 mL/min Final  . Anion gap 05/04/2019 9  5 - 15 Final   Performed at [facility], [address] [place], [place] [address]    Assessment:  Hayden Martin is a 86 y.o. 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 He presented with a 30-35 pound weight loss in 2-3 months.  CT-guided retroperitoneal lymph node biopsyon 01/21/2016 revealed a CD30 positive large B-cell lymphoma. Immunohistochemical studies were positive for CD20, CD30, BCL-2, BCL 6, Ki-67 (>90%) and negative for CD10 and cyclin D1.  Bone marrow biopsyon 01/21/2016 revealed no evidence of lymphoma. Flow cytometry was negative. Cytogenetics were normal (46, XY).  Abdomen and pelvic CTscan on 01/07/2016 revealed multiple solid masses within the spleen. There was extensive retroperitoneal, portal and mesenteric  lymphadenopathy. Retrocrural node measured 2.1 x 2.0 cm. Periportal node measured 2.6 x 5.8 cm. Confluent nodal mass is in the retroperitoneum surrounding the great vessels measured 5.6 x 3.8 cmon the left and 5.3 x 2.9 cm on the right. Left iliac node measured 1.7 x 2.6 cm. There was possible pleural disease on the left. The prostate was markedly enlarged. There was no disease in the liver.  PET scan on 01/16/2016 revealed extensive hypermetabolic adenopathy within the neck, chest, abdomen and pelvis. The spleen was enlarged with multi focal hypermetabolic splenic lesions There was evidence of transperitoneal spread of tumor within the upper abdomen. There were bilateral pleural effusions.  Hepatitis serologieswere negative on 01/16/2016. G6PD testingnegative. Echoon 01/18/2016 revealed an EF of 55-60%.   He received 6 cycles of mini-RCHOP(01/23/2016 - 05/16/2016). Cycle #1 was complicated bytumor lysis syndrome, leukopenia, and symptomatic pleural effusions. Diagnostic and 1 liter therapeutic left sided thoracentesison 02/08/2016 revealed involvement with lymphoma. He received OnPro Neulastawith cycle #2 - #6.  PET scanon 04/23/2016 revealed an excellent response to treatment. The extensive adenopathy seen on the prior PET-CT showedremarkable improvement. There was noresidual metabolically active lymphoma is identified. There was a moderate left pleural effusion and small right pleural effusion.  PET scanon 06/06/2016 revealed continued improvement and near resolution. There was only minimal retroperitoneal nodularity/adenopathy, much of which likely represents a residuum from previously treated lymphoma. The residual periaortic lymph node measured 1.2 cm in short axis with maximum SUV 2.2 (formerly 1.4 cm and 3.6, respectively). The minimal residual nodal activity was Deau[place] 3.  He has a history of left lower extremity thrombosis. He has had 2 clots 1 year apart  and is on life long anticoagulation. Bilateral lower  extremity duplexon 01/16/2016 revealed DVT in the left lower extremity involving the calf veins and the left popliteal vein. IVC filterwas placed on 01/18/2016. 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 There was interval resolution of the left calf vein DVT. He is on Xarelto.  He has chronic renal insufficiency(Cr 1.3- 1.94).  Chest CT angiogramon 02/02/2016 revealed no evidence of pulmonary emboli. There were bilateral pleural effusions(moderate size left and small to moderate right).  Symptomatically, he feels "pretty good".  He denies B symptoms.  He continues Xarelto without excess bruising or bleeding.  Exam reveals sock height edema.  Plan: 1.   Labs today: CBC with diff, CMP, LDH. 2.   Stage IIIB diffuse large B cell lymphoma Clinically, He is doing well.  Exam reveals no adenopathy or hepatosplenomegaly.             CBC is normal except for mild thrombocytopenia (platelets 131,000). LDH and uric acid are normal.  Continue surveillance. 3. Left lower extremity thrombosis He remains on Xarelto.  IVC filter is in place..             He has bilateral lower extremity edema.  He is using a compression device. 4.Hyperuricemia Uric acid is 7.4.  Patient currently on allopurinol.  Discontinue allopurinol and check uric acid in 1 month. 5.   Abnormal SPEP  SPEP on 01/31/2019 revealed a poorly defined band of restricted protein mobility in the gamma globulins.  SPEP today reveals no monoclonal protein. 6.   RTC in 1 month for labs (uric acid). 7.   RTC in 4 months for MD assessment and labs (CBC with diff, CMP, LDH, uric acid).    I discussed the assessment and treatment plan with the patient.  The patient was provided an opportunity to ask  questions and all were answered.  The patient agreed with the plan and demonstrated an understanding of the instructions.  The patient was advised to call back if the symptoms worsen or if the condition fails to improve as anticipated.  I provided 22 minutes of face-to-face time during this this encounter and > 50% was spent counseling as documented under my assessment and plan.    Lequita Asal, MD, PhD    05/04/2019, 11:07 AM  I, Selena Batten, am acting as scribe for [employer]. Mike Gip, MD, PhD.  I, Adyn Hoes C. Mike Gip, MD, have reviewed the above documentation for accuracy and completeness, and I agree with the above.

## 2019-05-03 ENCOUNTER — Other Ambulatory Visit: Payer: Self-pay

## 2019-05-03 ENCOUNTER — Encounter: Payer: Self-pay | Admitting: Hematology and Oncology

## 2019-05-03 NOTE — Progress Notes (Signed)
No new changes noted today. The Name and DOB has been verified by phone. 

## 2019-05-04 ENCOUNTER — Other Ambulatory Visit: Payer: Self-pay | Admitting: Hematology and Oncology

## 2019-05-04 ENCOUNTER — Inpatient Hospital Stay: Payer: Medicare Other

## 2019-05-04 ENCOUNTER — Inpatient Hospital Stay: Payer: Medicare Other | Attending: Hematology and Oncology | Admitting: Hematology and Oncology

## 2019-05-04 ENCOUNTER — Encounter: Payer: Self-pay | Admitting: Hematology and Oncology

## 2019-05-04 VITALS — BP 155/58 | HR 57 | Temp 97.1°F | Resp 18 | Wt 217.0 lb

## 2019-05-04 DIAGNOSIS — N189 Chronic kidney disease, unspecified: Secondary | ICD-10-CM | POA: Diagnosis not present

## 2019-05-04 DIAGNOSIS — I82532 Chronic embolism and thrombosis of left popliteal vein: Secondary | ICD-10-CM | POA: Diagnosis not present

## 2019-05-04 DIAGNOSIS — Z87891 Personal history of nicotine dependence: Secondary | ICD-10-CM | POA: Diagnosis not present

## 2019-05-04 DIAGNOSIS — Z79899 Other long term (current) drug therapy: Secondary | ICD-10-CM | POA: Insufficient documentation

## 2019-05-04 DIAGNOSIS — Z86718 Personal history of other venous thrombosis and embolism: Secondary | ICD-10-CM | POA: Diagnosis not present

## 2019-05-04 DIAGNOSIS — I10 Essential (primary) hypertension: Secondary | ICD-10-CM | POA: Diagnosis not present

## 2019-05-04 DIAGNOSIS — N183 Chronic kidney disease, stage 3 unspecified: Secondary | ICD-10-CM | POA: Diagnosis not present

## 2019-05-04 DIAGNOSIS — C8333 Diffuse large B-cell lymphoma, intra-abdominal lymph nodes: Secondary | ICD-10-CM | POA: Diagnosis not present

## 2019-05-04 DIAGNOSIS — E119 Type 2 diabetes mellitus without complications: Secondary | ICD-10-CM | POA: Diagnosis not present

## 2019-05-04 DIAGNOSIS — E790 Hyperuricemia without signs of inflammatory arthritis and tophaceous disease: Secondary | ICD-10-CM | POA: Insufficient documentation

## 2019-05-04 DIAGNOSIS — Z9221 Personal history of antineoplastic chemotherapy: Secondary | ICD-10-CM | POA: Insufficient documentation

## 2019-05-04 DIAGNOSIS — D696 Thrombocytopenia, unspecified: Secondary | ICD-10-CM | POA: Insufficient documentation

## 2019-05-04 DIAGNOSIS — Z8572 Personal history of non-Hodgkin lymphomas: Secondary | ICD-10-CM | POA: Insufficient documentation

## 2019-05-04 DIAGNOSIS — R778 Other specified abnormalities of plasma proteins: Secondary | ICD-10-CM

## 2019-05-04 DIAGNOSIS — Z7901 Long term (current) use of anticoagulants: Secondary | ICD-10-CM | POA: Diagnosis not present

## 2019-05-04 DIAGNOSIS — Z7982 Long term (current) use of aspirin: Secondary | ICD-10-CM | POA: Insufficient documentation

## 2019-05-04 DIAGNOSIS — Z794 Long term (current) use of insulin: Secondary | ICD-10-CM | POA: Diagnosis not present

## 2019-05-04 LAB — CBC WITH DIFFERENTIAL/PLATELET
Abs Immature Granulocytes: 0.02 10*3/uL (ref 0.00–0.07)
Basophils Absolute: 0.1 10*3/uL (ref 0.0–0.1)
Basophils Relative: 1 %
Eosinophils Absolute: 0.3 10*3/uL (ref 0.0–0.5)
Eosinophils Relative: 4 %
HCT: 44.3 % (ref 39.0–52.0)
Hemoglobin: 14 g/dL (ref 13.0–17.0)
Immature Granulocytes: 0 %
Lymphocytes Relative: 25 %
Lymphs Abs: 1.8 10*3/uL (ref 0.7–4.0)
MCH: 28.9 pg (ref 26.0–34.0)
MCHC: 31.6 g/dL (ref 30.0–36.0)
MCV: 91.5 fL (ref 80.0–100.0)
Monocytes Absolute: 0.7 10*3/uL (ref 0.1–1.0)
Monocytes Relative: 9 %
Neutro Abs: 4.4 10*3/uL (ref 1.7–7.7)
Neutrophils Relative %: 61 %
Platelets: 131 10*3/uL — ABNORMAL LOW (ref 150–400)
RBC: 4.84 MIL/uL (ref 4.22–5.81)
RDW: 16.2 % — ABNORMAL HIGH (ref 11.5–15.5)
WBC: 7.2 10*3/uL (ref 4.0–10.5)
nRBC: 0 % (ref 0.0–0.2)

## 2019-05-04 LAB — COMPREHENSIVE METABOLIC PANEL
ALT: 30 U/L (ref 0–44)
AST: 22 U/L (ref 15–41)
Albumin: 3.8 g/dL (ref 3.5–5.0)
Alkaline Phosphatase: 52 U/L (ref 38–126)
Anion gap: 9 (ref 5–15)
BUN: 36 mg/dL — ABNORMAL HIGH (ref 8–23)
CO2: 29 mmol/L (ref 22–32)
Calcium: 9.1 mg/dL (ref 8.9–10.3)
Chloride: 101 mmol/L (ref 98–111)
Creatinine, Ser: 1.8 mg/dL — ABNORMAL HIGH (ref 0.61–1.24)
GFR calc Af Amer: 39 mL/min — ABNORMAL LOW (ref >60)
GFR calc non Af Amer: 34 mL/min — ABNORMAL LOW (ref >60)
Glucose, Bld: 102 mg/dL — ABNORMAL HIGH (ref 70–99)
Potassium: 4.9 mmol/L (ref 3.5–5.1)
Sodium: 139 mmol/L (ref 135–145)
Total Bilirubin: 1 mg/dL (ref 0.3–1.2)
Total Protein: 7.2 g/dL (ref 6.5–8.1)

## 2019-05-04 LAB — LACTATE DEHYDROGENASE: LDH: 160 U/L (ref 98–192)

## 2019-05-04 LAB — URIC ACID: Uric Acid, Serum: 7.4 mg/dL (ref 3.7–8.6)

## 2019-05-05 LAB — KAPPA/LAMBDA LIGHT CHAINS
Kappa free light chain: 80.6 mg/L — ABNORMAL HIGH (ref 3.3–19.4)
Kappa, lambda light chain ratio: 1.76 — ABNORMAL HIGH (ref 0.26–1.65)
Lambda free light chains: 45.8 mg/L — ABNORMAL HIGH (ref 5.7–26.3)

## 2019-05-09 LAB — MULTIPLE MYELOMA PANEL, SERUM
Albumin SerPl Elph-Mcnc: 3.7 g/dL (ref 2.9–4.4)
Albumin/Glob SerPl: 1.2 (ref 0.7–1.7)
Alpha 1: 0.2 g/dL (ref 0.0–0.4)
Alpha2 Glob SerPl Elph-Mcnc: 0.8 g/dL (ref 0.4–1.0)
B-Globulin SerPl Elph-Mcnc: 1 g/dL (ref 0.7–1.3)
Gamma Glob SerPl Elph-Mcnc: 1.4 g/dL (ref 0.4–1.8)
Globulin, Total: 3.3 g/dL (ref 2.2–3.9)
IgA: 281 mg/dL (ref 61–437)
IgG (Immunoglobin G), Serum: 1271 mg/dL (ref 603–1613)
IgM (Immunoglobulin M), Srm: 87 mg/dL (ref 15–143)
Total Protein ELP: 7 g/dL (ref 6.0–8.5)

## 2019-06-01 ENCOUNTER — Other Ambulatory Visit: Payer: Self-pay

## 2019-06-01 ENCOUNTER — Inpatient Hospital Stay: Payer: Medicare Other

## 2019-06-01 DIAGNOSIS — Z8572 Personal history of non-Hodgkin lymphomas: Secondary | ICD-10-CM | POA: Diagnosis not present

## 2019-06-01 DIAGNOSIS — C8333 Diffuse large B-cell lymphoma, intra-abdominal lymph nodes: Secondary | ICD-10-CM

## 2019-06-01 LAB — CBC WITH DIFFERENTIAL/PLATELET
Abs Immature Granulocytes: 0.04 10*3/uL (ref 0.00–0.07)
Basophils Absolute: 0.1 10*3/uL (ref 0.0–0.1)
Basophils Relative: 1 %
Eosinophils Absolute: 0.3 10*3/uL (ref 0.0–0.5)
Eosinophils Relative: 3 %
HCT: 45 % (ref 39.0–52.0)
Hemoglobin: 14.4 g/dL (ref 13.0–17.0)
Immature Granulocytes: 1 %
Lymphocytes Relative: 25 %
Lymphs Abs: 2 10*3/uL (ref 0.7–4.0)
MCH: 28.9 pg (ref 26.0–34.0)
MCHC: 32 g/dL (ref 30.0–36.0)
MCV: 90.4 fL (ref 80.0–100.0)
Monocytes Absolute: 0.8 10*3/uL (ref 0.1–1.0)
Monocytes Relative: 10 %
Neutro Abs: 5 10*3/uL (ref 1.7–7.7)
Neutrophils Relative %: 60 %
Platelets: 150 10*3/uL (ref 150–400)
RBC: 4.98 MIL/uL (ref 4.22–5.81)
RDW: 15.1 % (ref 11.5–15.5)
WBC: 8.2 10*3/uL (ref 4.0–10.5)
nRBC: 0 % (ref 0.0–0.2)

## 2019-06-01 LAB — COMPREHENSIVE METABOLIC PANEL
ALT: 31 U/L (ref 0–44)
AST: 19 U/L (ref 15–41)
Albumin: 3.8 g/dL (ref 3.5–5.0)
Alkaline Phosphatase: 52 U/L (ref 38–126)
Anion gap: 8 (ref 5–15)
BUN: 29 mg/dL — ABNORMAL HIGH (ref 8–23)
CO2: 26 mmol/L (ref 22–32)
Calcium: 8.7 mg/dL — ABNORMAL LOW (ref 8.9–10.3)
Chloride: 105 mmol/L (ref 98–111)
Creatinine, Ser: 1.5 mg/dL — ABNORMAL HIGH (ref 0.61–1.24)
GFR calc Af Amer: 49 mL/min — ABNORMAL LOW (ref >60)
GFR calc non Af Amer: 42 mL/min — ABNORMAL LOW (ref >60)
Glucose, Bld: 103 mg/dL — ABNORMAL HIGH (ref 70–99)
Potassium: 4.4 mmol/L (ref 3.5–5.1)
Sodium: 139 mmol/L (ref 135–145)
Total Bilirubin: 0.5 mg/dL (ref 0.3–1.2)
Total Protein: 7.1 g/dL (ref 6.5–8.1)

## 2019-06-01 LAB — URIC ACID: Uric Acid, Serum: 7.2 mg/dL (ref 3.7–8.6)

## 2019-06-01 LAB — LACTATE DEHYDROGENASE: LDH: 162 U/L (ref 98–192)

## 2019-09-06 ENCOUNTER — Encounter: Payer: Self-pay | Admitting: Hematology and Oncology

## 2019-09-06 ENCOUNTER — Other Ambulatory Visit: Payer: Self-pay

## 2019-09-06 DIAGNOSIS — C8333 Diffuse large B-cell lymphoma, intra-abdominal lymph nodes: Secondary | ICD-10-CM

## 2019-09-06 NOTE — Progress Notes (Signed)
[facility]  [address], [address] [place], [place] [address] Phone: [phone]  Fax: [phone]   Clinic Day:  Hayden Martin  Referring physician: Kirk Ruths, MD  Chief Complaint: Hayden Martin is a 86 y.o. 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  HPI: The patient was last seen in the medical oncology clinic on 05/04/2019.  At that time,  he felt "pretty good".  He denied B symptoms.  He continued Xarelto without excess bruising or bleeding.  Exam revealed sock height edema. Hematocrit was 44.3, hemoglobin 14.0, platelets 131,000, WBC 7,200.  Creatinine was 1.80. CrCl was 34 ml/min. Kappa free light chain was 80.6, free lambda light chain was 45.8, and ratio 1.76 (normal).  M-spike was 0. LDH was 160.  Uric acid was 7.4. Allopurinol was discontinued.  Labs on 06/01/2019 revealed a uric acid of 7.2.  During the interim, he has been fair. He has had shortness of breath since he had COVID-19 in 12/2018. He reports low energy, but is able to take care of himself completely. He still has cramps in his ankles and feet at night. He denies fevers, sweats, and weight loss. His leg swelling has resolved. He checks his blood sugar every morning. It was 105 this morning but is usually less than 100. He is still taking Xarelto.   Past Medical History:  Diagnosis Date  . Cancer ([place])   . Clotting disorder ([place])   . Diabetes mellitus without complication ([place])   . Heart disease   . Hypertension     Past Surgical History:  Procedure Laterality Date  . HERNIA REPAIR    . PERIPHERAL VASCULAR CATHETERIZATION N/A 01/18/2016   Procedure: IVC Filter Insertion;  Surgeon: Katha Cabal, MD;  Location: [place] [facility];  Service: Cardiovascular;  Laterality: N/A;  . PERIPHERAL VASCULAR CATHETERIZATION N/A 01/21/2016   Procedure: Glori Luis Cath Insertion;  Surgeon:  Algernon Huxley, MD;  Location: [place] [facility];  Service: Cardiovascular;  Laterality: N/A;  . PORTA CATH REMOVAL N/A 01/07/2018   Procedure: PORTA CATH REMOVAL;  Surgeon: Algernon Huxley, MD;  Location: [place] [facility];  Service: Cardiovascular;  Laterality: N/A;    History reviewed. No pertinent family history.  Social History:  reports that he quit smoking about 33 years ago. He smoked 1.50 packs per day. He has quit using smokeless tobacco. No history on file for alcohol use and drug use. He was in the [employer]. The patient is a retired Engineer, structural.He lives by himself.He was discharged from [facility] on 02/20/2016. He has 3 children who live locally. He recently bought a new car [facility]).He describes recently being a passenger in a [employer] airplane. He likes to play poker. The patient is alone today.  Allergies:  Allergies  Allergen Reactions  . Codeine Itching  . Penicillins Itching    Current Medications: Current Outpatient Medications  Medication Sig Dispense Refill  . amLODipine (NORVASC) 5 MG tablet Take 10 mg by mouth daily.     [name] aspirin EC 81 MG tablet Take 81 mg by mouth daily.    [name] atorvastatin (LIPITOR) 40 MG tablet Take 20 mg by mouth daily.    . carvedilol (COREG) 6.25 MG tablet TAKE ONE (1) TABLET BY MOUTH TWO (2) TIMES DAILY    . furosemide (LASIX) 20 MG tablet Take 20 mg by mouth daily.     [name]  Insulin Glargine (LANTUS [place]) Inject into the skin. Inject 36 units subcutaneously at bedtime.    [name] ketoconazole (NIZORAL) 2 % cream APPLY AT BEDTIME DAILY TO AFFECTED AREAS    . liraglutide (VICTOZA) 18 MG/3ML SOPN Inject into the skin daily.    [name] lisinopril (ZESTRIL) 10 MG tablet Take 10 mg by mouth daily.    . rivaroxaban (XARELTO) 20 MG TABS tablet Take 20 mg by mouth daily with supper.    [name] allopurinol (ZYLOPRIM) 100 MG tablet Take 0.5 tablets (50 mg total) by mouth daily. 30 tablet 2   No current facility-administered medications for this visit.    Facility-Administered Medications Ordered in Other Visits  Medication Dose Route Frequency Provider Last Rate Last Admin  . sodium chloride flush (NS) 0.9 % injection 10 mL  10 mL Intravenous PRN Cammie Sickle, MD   10 mL at 03/10/16 0949    Review of Systems  Constitutional: Positive for malaise/fatigue. Negative for chills, diaphoresis, fever and weight loss (up 8 lbs).       Doing fair.  HENT: Positive for hearing loss (hearing aid). Negative for congestion, ear pain, nosebleeds, sinus pain and sore throat.   Eyes: Negative.  Negative for blurred vision, double vision and photophobia.  Respiratory: Positive for shortness of breath. Negative for cough, hemoptysis and sputum production.   Cardiovascular: Negative for chest pain, palpitations, orthopnea, leg swelling (resolved) and PND.  Gastrointestinal: Negative.  Negative for abdominal pain, blood in stool, constipation, diarrhea, heartburn, melena, nausea and vomiting.       Diet is good.  Genitourinary: Negative.  Negative for dysuria, frequency, hematuria and urgency.  Musculoskeletal: Negative for back pain (chronic), falls, joint pain, myalgias and neck pain.       History of DVT.  Chronic ankle and foot cramps at night.  Skin: Negative.  Negative for itching and rash.  Neurological: Negative for dizziness, tremors, sensory change, speech change, focal weakness, weakness and headaches.       Poor balance since chemotherapy.  Endo/Heme/Allergies: Does not bruise/bleed easily (on Xarelto).       Diabetes.  Psychiatric/Behavioral: Negative.  Negative for depression and memory loss. The patient is not nervous/anxious and does not have insomnia.   All other systems reviewed and are negative.  Performance status (ECOG): 1  Vitals Blood pressure 112/67, pulse 60, temperature 97.7 F (36.5 C), temperature source Tympanic, resp. rate 18, height 5' 11" (1.803 m), weight 225 lb 5 oz (102.2 kg), SpO2 93 %.   Physical  Exam Vitals and nursing note reviewed.  Constitutional:      General: He is not in acute distress.    Appearance: He is well-developed. He is not diaphoretic.  HENT:     Head: Normocephalic and atraumatic.     Comments: Gray hair.    Mouth/Throat:     Mouth: Mucous membranes are moist.     Pharynx: Oropharynx is clear. No oropharyngeal exudate.  Eyes:     General: No scleral icterus.    Extraocular Movements: Extraocular movements intact.     Conjunctiva/sclera: Conjunctivae normal.     Pupils: Pupils are equal, round, and reactive to light.     Comments: Glasses.  Blue eyes.   Neck:     Vascular: No JVD.  Cardiovascular:     Rate and Rhythm: Normal rate and regular rhythm.     Heart sounds: Murmur (aortic) heard.   Pulmonary:     Effort: Pulmonary effort is normal. No respiratory distress.  Breath sounds: Normal breath sounds. No wheezing or rales.  Chest:     Chest wall: No tenderness.  Abdominal:     General: Bowel sounds are normal. There is no distension.     Palpations: Abdomen is soft. There is no mass.     Tenderness: There is no abdominal tenderness. There is no guarding or rebound.  Musculoskeletal:        General: No tenderness. Normal range of motion.     Cervical back: Normal range of motion and neck supple.     Right lower leg: Edema (2+) present.     Left lower leg: Edema (3+) present.  Lymphadenopathy:     Head:     Right side of head: No preauricular, posterior auricular or occipital adenopathy.     Left side of head: No preauricular, posterior auricular or occipital adenopathy.     Cervical: No cervical adenopathy.     Upper Body:     Right upper body: No supraclavicular or axillary adenopathy.     Left upper body: No supraclavicular or axillary adenopathy.     Lower Body: No right inguinal adenopathy. No left inguinal adenopathy.  Skin:    General: Skin is warm and dry.     Findings: No erythema.  Neurological:     Mental Status: He is alert and  oriented to person, place, and time.  Psychiatric:        Behavior: Behavior normal.        Thought Content: Thought content normal.        Judgment: Judgment normal.    Orders Only on 09/07/2019  Component Date Value Ref Range Status  . Iron 09/07/2019 57  45 - 182 ug/dL Final  . TIBC 09/07/2019 298  250 - 450 ug/dL Final  . Saturation Ratios 09/07/2019 19  17.9 - 39.5 % Final  . UIBC 09/07/2019 241  ug/dL Final   Performed at [facility], [address] [place], [place] [address]  . Ferritin 09/07/2019 115  24 - 336 ng/mL Final   Performed at [facility], [address] [place], [place] [address]  Appointment on 09/07/2019  Component Date Value Ref Range Status  . Uric Acid, Serum 09/07/2019 9.6* 3.7 - 8.6 mg/dL Final   Performed at [facility], [address] [place], [place] [address]  . LDH 09/07/2019 143  98 - 192 U/L Final   Performed at [facility], [address], [place], [place] [address]  . Sodium 09/07/2019 141  135 - 145 mmol/L Final  . Potassium 09/07/2019 5.1  3.5 - 5.1 mmol/L Final  . Chloride 09/07/2019 108  98 - 111 mmol/L Final  . CO2 09/07/2019 26  22 - 32 mmol/L Final  . Glucose, Bld 09/07/2019 118* 70 - 99 mg/dL Final   Glucose reference range applies only to samples taken after fasting for at least 8 hours.  . BUN 09/07/2019 45* 8 - 23 mg/dL Final  . Creatinine, Ser 09/07/2019 1.96* 0.61 - 1.24 mg/dL Final  . Calcium 09/07/2019 8.8* 8.9 - 10.3 mg/dL Final  . Total Protein 09/07/2019 6.8  6.5 - 8.1 g/dL Final  . Albumin 09/07/2019 3.7  3.5 - 5.0 g/dL Final  . AST 09/07/2019 15  15 - 41 U/L Final  . ALT 09/07/2019 24  0 - 44 U/L Final  . Alkaline Phosphatase 09/07/2019 45  38 - 126 U/L Final  . Total Bilirubin 09/07/2019 0.9  0.3 - 1.2  mg/dL Final  . GFR calc non Af Amer 09/07/2019 30* >60 mL/min Final  . GFR calc Af Amer 09/07/2019 35* >60 mL/min Final  . Anion gap 09/07/2019 7  5 - 15 Final    Performed at [facility], [address] [place], [place] [address]  . WBC 09/07/2019 8.1  4.0 - 10.5 K/uL Final  . RBC 09/07/2019 4.20* 4.22 - 5.81 MIL/uL Final  . Hemoglobin 09/07/2019 12.6* 13.0 - 17.0 g/dL Final  . HCT 09/07/2019 38.5* 39 - 52 % Final  . MCV 09/07/2019 91.7  80.0 - 100.0 fL Final  . MCH 09/07/2019 30.0  26.0 - 34.0 pg Final  . MCHC 09/07/2019 32.7  30.0 - 36.0 g/dL Final  . RDW 09/07/2019 15.7* 11.5 - 15.5 % Final  . Platelets 09/07/2019 155  150 - 400 K/uL Final  . nRBC 09/07/2019 0.0  0.0 - 0.2 % Final  . Neutrophils Relative % 09/07/2019 62  % Final  . Neutro Abs 09/07/2019 5.0  1.7 - 7.7 K/uL Final  . Lymphocytes Relative 09/07/2019 23  % Final  . Lymphs Abs 09/07/2019 1.9  0.7 - 4.0 K/uL Final  . Monocytes Relative 09/07/2019 10  % Final  . Monocytes Absolute 09/07/2019 0.8  0 - 1 K/uL Final  . Eosinophils Relative 09/07/2019 4  % Final  . Eosinophils Absolute 09/07/2019 0.3  0 - 0 K/uL Final  . Basophils Relative 09/07/2019 1  % Final  . Basophils Absolute 09/07/2019 0.1  0 - 0 K/uL Final  . Immature Granulocytes 09/07/2019 0  % Final  . Abs Immature Granulocytes 09/07/2019 0.03  0.00 - 0.07 K/uL Final   Performed at [facility], [address] [place],  [address]    Assessment:  LOVIE AGRESTA is a 86 y.o. 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 He presented with a 30-35 pound weight loss in 2-3 months.  CT-guided retroperitoneal lymph node biopsyon 01/21/2016 revealed a CD30 positive large B-cell lymphoma. Immunohistochemical studies were positive for CD20, CD30, BCL-2, BCL 6, Ki-67 (>90%) and negative for CD10 and cyclin D1.  Bone marrow biopsyon 01/21/2016 revealed no evidence of lymphoma. Flow cytometry was negative. Cytogenetics were normal (46, XY).  Abdomen and pelvic CTscan on 01/07/2016 revealed multiple solid masses within the spleen. There was extensive retroperitoneal, portal and  mesenteric lymphadenopathy. Retrocrural node measured 2.1 x 2.0 cm. Periportal node measured 2.6 x 5.8 cm. Confluent nodal mass is in the retroperitoneum surrounding the great vessels measured 5.6 x 3.8 cmon the left and 5.3 x 2.9 cm on the right. Left iliac node measured 1.7 x 2.6 cm. There was possible pleural disease on the left. The prostate was markedly enlarged. There was no disease in the liver.  PET scan on 01/16/2016 revealed extensive hypermetabolic adenopathy within the neck, chest, abdomen and pelvis. The spleen was enlarged with multi focal hypermetabolic splenic lesions There was evidence of transperitoneal spread of tumor within the upper abdomen. There were bilateral pleural effusions.  Hepatitis serologieswere negative on 01/16/2016. G6PD testingnegative. [place] 01/18/2016 revealed an EF of 55-60%.   He received 6 cycles of mini-RCHOP(01/23/2016 - 05/16/2016). Cycle #1 was complicated bytumor lysis syndrome, leukopenia, and symptomatic pleural effusions. Diagnostic and 1 liter therapeutic left sided thoracentesison 02/08/2016 revealed involvement with lymphoma. He received OnPro Neulastawith cycle #2 - #6.  PET scanon 04/23/2016 revealed an excellent response to treatment. The extensive adenopathy seen on the prior PET-CT showedremarkable improvement. There was noresidual  metabolically active lymphoma is identified. There was a moderate left pleural effusion and small right pleural effusion.  PET scanon 06/06/2016 revealed continued improvement and near resolution. There was only minimal retroperitoneal nodularity/adenopathy, much of which likely represents a residuum from previously treated lymphoma. The residual periaortic lymph node measured 1.2 cm in short axis with maximum SUV 2.2 (formerly 1.4 cm and 3.6, respectively). The minimal residual nodal activity was [place] 3.  He has a history of left lower extremity thrombosis. He has had 2 clots 1  year apart and is on life long anticoagulation. Bilateral lower extremity duplexon 01/16/2016 revealed DVT in the left lower extremity involving the calf veins and the left popliteal vein. IVC filterwas placed on 01/18/2016. 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 There was interval resolution of the left calf vein DVT. He is on Xarelto.  He has chronic renal insufficiency(Cr 1.3- 1.94).  SPEP on 01/31/2019 revealed a poorly defined band of restricted protein mobility in the gamma globulins.  M-spike was 0 and free light chain ratio was 1.76 (normal) on 05/04/2019.  Chest CT angiogramon 02/02/2016 revealed no evidence of pulmonary emboli. There were bilateral pleural effusions(moderate size left and small to moderate right).  Symptomatically, he notes chronic shortness of breath since COVID-19.  He denies any B symptoms.  Exam reveals no adenopathy or hepatosplenomegaly.  He has chronic bilateral lower extremity edema.  Plan: 1.   Labs today: CBC with diff, CMP, LDH, uric acid. 2.   Stage IIIB diffuse large B cell lymphoma Clinically, he continues to do well.  Exam reveals no adenopathy or hepatosplenomegaly.             CBC is normal. LDH is normal.    Uric acid is slightly elevated off allopurinol.   Rx:  allopurinol 50 mg po q day.  Continue surveillance. 3. Left lower extremity thrombosis He denies any excess bruising or bleeding.  IVC filter is in place.             Bilateral lower extremity edema.  Continue Xarelto. 4.Hyperuricemia Uric acid is 9.6.  Restart allopurinol 50 mg p.o. daily. 5.   Mild normocytic anemia  Hematocrit 38.5.  Hemoglobin 12.6.  MCV 91.7.  Check ferritin and iron studies.   He may have anemia of chronic renal disease (CrCl 32.9 ml/min). 6.   RTC in 3 month for labs  (CBC with diff, BMP, uric acid). 7.   RTC in 6 months for MD assessment and labs (CBC with diff, CMP, LDH, uric acid).   Hayden Martin discussed the assessment and treatment plan with the patient.  The patient was provided an opportunity to ask questions and all were answered.  The patient agreed with the plan and demonstrated an understanding of the instructions.  The patient was advised to call back if the symptoms worsen or if the condition fails to improve as anticipated.   Hayden Asal, MD, PhD    Hayden Martin, 11:47 AM  Hayden Martin, Hayden Martin, am acting as Education administrator for [employer]. Mike Gip, MD, PhD.  Hayden Martin, Hayden Hehir C. Mike Gip, MD, have reviewed the above documentation for accuracy and completeness, and Hayden Martin agree with the above.

## 2019-09-06 NOTE — Progress Notes (Signed)
Patient stated that he would like for his Furosemide to be increased.

## 2019-09-07 ENCOUNTER — Encounter: Payer: Self-pay | Admitting: Hematology and Oncology

## 2019-09-07 ENCOUNTER — Inpatient Hospital Stay: Payer: Medicare Other

## 2019-09-07 ENCOUNTER — Inpatient Hospital Stay: Payer: Medicare Other | Attending: Hematology and Oncology | Admitting: Hematology and Oncology

## 2019-09-07 ENCOUNTER — Other Ambulatory Visit: Payer: Self-pay

## 2019-09-07 VITALS — BP 112/67 | HR 60 | Temp 97.7°F | Resp 18 | Ht 71.0 in | Wt 225.3 lb

## 2019-09-07 DIAGNOSIS — Z7984 Long term (current) use of oral hypoglycemic drugs: Secondary | ICD-10-CM | POA: Diagnosis not present

## 2019-09-07 DIAGNOSIS — N189 Chronic kidney disease, unspecified: Secondary | ICD-10-CM | POA: Insufficient documentation

## 2019-09-07 DIAGNOSIS — Z86718 Personal history of other venous thrombosis and embolism: Secondary | ICD-10-CM | POA: Diagnosis not present

## 2019-09-07 DIAGNOSIS — E790 Hyperuricemia without signs of inflammatory arthritis and tophaceous disease: Secondary | ICD-10-CM | POA: Diagnosis not present

## 2019-09-07 DIAGNOSIS — J90 Pleural effusion, not elsewhere classified: Secondary | ICD-10-CM | POA: Insufficient documentation

## 2019-09-07 DIAGNOSIS — I82432 Acute embolism and thrombosis of left popliteal vein: Secondary | ICD-10-CM

## 2019-09-07 DIAGNOSIS — C8333 Diffuse large B-cell lymphoma, intra-abdominal lymph nodes: Secondary | ICD-10-CM | POA: Diagnosis not present

## 2019-09-07 DIAGNOSIS — Z87891 Personal history of nicotine dependence: Secondary | ICD-10-CM | POA: Diagnosis not present

## 2019-09-07 DIAGNOSIS — Z7901 Long term (current) use of anticoagulants: Secondary | ICD-10-CM | POA: Diagnosis not present

## 2019-09-07 DIAGNOSIS — E119 Type 2 diabetes mellitus without complications: Secondary | ICD-10-CM | POA: Insufficient documentation

## 2019-09-07 DIAGNOSIS — C8330 Diffuse large B-cell lymphoma, unspecified site: Secondary | ICD-10-CM | POA: Insufficient documentation

## 2019-09-07 DIAGNOSIS — Z95828 Presence of other vascular implants and grafts: Secondary | ICD-10-CM | POA: Diagnosis not present

## 2019-09-07 DIAGNOSIS — D649 Anemia, unspecified: Secondary | ICD-10-CM

## 2019-09-07 LAB — URIC ACID: Uric Acid, Serum: 9.6 mg/dL — ABNORMAL HIGH (ref 3.7–8.6)

## 2019-09-07 LAB — CBC WITH DIFFERENTIAL/PLATELET
Abs Immature Granulocytes: 0.03 10*3/uL (ref 0.00–0.07)
Basophils Absolute: 0.1 10*3/uL (ref 0.0–0.1)
Basophils Relative: 1 %
Eosinophils Absolute: 0.3 10*3/uL (ref 0.0–0.5)
Eosinophils Relative: 4 %
HCT: 38.5 % — ABNORMAL LOW (ref 39.0–52.0)
Hemoglobin: 12.6 g/dL — ABNORMAL LOW (ref 13.0–17.0)
Immature Granulocytes: 0 %
Lymphocytes Relative: 23 %
Lymphs Abs: 1.9 10*3/uL (ref 0.7–4.0)
MCH: 30 pg (ref 26.0–34.0)
MCHC: 32.7 g/dL (ref 30.0–36.0)
MCV: 91.7 fL (ref 80.0–100.0)
Monocytes Absolute: 0.8 10*3/uL (ref 0.1–1.0)
Monocytes Relative: 10 %
Neutro Abs: 5 10*3/uL (ref 1.7–7.7)
Neutrophils Relative %: 62 %
Platelets: 155 10*3/uL (ref 150–400)
RBC: 4.2 MIL/uL — ABNORMAL LOW (ref 4.22–5.81)
RDW: 15.7 % — ABNORMAL HIGH (ref 11.5–15.5)
WBC: 8.1 10*3/uL (ref 4.0–10.5)
nRBC: 0 % (ref 0.0–0.2)

## 2019-09-07 LAB — COMPREHENSIVE METABOLIC PANEL
ALT: 24 U/L (ref 0–44)
AST: 15 U/L (ref 15–41)
Albumin: 3.7 g/dL (ref 3.5–5.0)
Alkaline Phosphatase: 45 U/L (ref 38–126)
Anion gap: 7 (ref 5–15)
BUN: 45 mg/dL — ABNORMAL HIGH (ref 8–23)
CO2: 26 mmol/L (ref 22–32)
Calcium: 8.8 mg/dL — ABNORMAL LOW (ref 8.9–10.3)
Chloride: 108 mmol/L (ref 98–111)
Creatinine, Ser: 1.96 mg/dL — ABNORMAL HIGH (ref 0.61–1.24)
GFR calc Af Amer: 35 mL/min — ABNORMAL LOW (ref >60)
GFR calc non Af Amer: 30 mL/min — ABNORMAL LOW (ref >60)
Glucose, Bld: 118 mg/dL — ABNORMAL HIGH (ref 70–99)
Potassium: 5.1 mmol/L (ref 3.5–5.1)
Sodium: 141 mmol/L (ref 135–145)
Total Bilirubin: 0.9 mg/dL (ref 0.3–1.2)
Total Protein: 6.8 g/dL (ref 6.5–8.1)

## 2019-09-07 LAB — LACTATE DEHYDROGENASE: LDH: 143 U/L (ref 98–192)

## 2019-09-07 LAB — IRON AND TIBC
Iron: 57 ug/dL (ref 45–182)
Saturation Ratios: 19 % (ref 17.9–39.5)
TIBC: 298 ug/dL (ref 250–450)
UIBC: 241 ug/dL

## 2019-09-07 LAB — FERRITIN: Ferritin: 115 ng/mL (ref 24–336)

## 2019-09-07 MED ORDER — ALLOPURINOL 100 MG PO TABS: 50.0000 mg | ORAL_TABLET | Freq: Every day | ORAL | 2 refills | Status: DC

## 2019-09-26 DIAGNOSIS — I509 Heart failure, unspecified: Secondary | ICD-10-CM

## 2019-12-08 ENCOUNTER — Other Ambulatory Visit: Payer: Self-pay

## 2019-12-08 ENCOUNTER — Inpatient Hospital Stay: Payer: Medicare Other | Attending: Hematology and Oncology

## 2019-12-08 DIAGNOSIS — E790 Hyperuricemia without signs of inflammatory arthritis and tophaceous disease: Secondary | ICD-10-CM

## 2019-12-08 DIAGNOSIS — C8333 Diffuse large B-cell lymphoma, intra-abdominal lymph nodes: Secondary | ICD-10-CM | POA: Insufficient documentation

## 2019-12-08 DIAGNOSIS — D649 Anemia, unspecified: Secondary | ICD-10-CM

## 2019-12-08 DIAGNOSIS — I82432 Acute embolism and thrombosis of left popliteal vein: Secondary | ICD-10-CM

## 2019-12-08 LAB — CBC WITH DIFFERENTIAL/PLATELET
Abs Immature Granulocytes: 0.04 10*3/uL (ref 0.00–0.07)
Basophils Absolute: 0.1 10*3/uL (ref 0.0–0.1)
Basophils Relative: 1 %
Eosinophils Absolute: 0.3 10*3/uL (ref 0.0–0.5)
Eosinophils Relative: 3 %
HCT: 43.9 % (ref 39.0–52.0)
Hemoglobin: 14.2 g/dL (ref 13.0–17.0)
Immature Granulocytes: 0 %
Lymphocytes Relative: 22 %
Lymphs Abs: 2 10*3/uL (ref 0.7–4.0)
MCH: 29.2 pg (ref 26.0–34.0)
MCHC: 32.3 g/dL (ref 30.0–36.0)
MCV: 90.3 fL (ref 80.0–100.0)
Monocytes Absolute: 1 10*3/uL (ref 0.1–1.0)
Monocytes Relative: 10 %
Neutro Abs: 5.9 10*3/uL (ref 1.7–7.7)
Neutrophils Relative %: 64 %
Platelets: 140 10*3/uL — ABNORMAL LOW (ref 150–400)
RBC: 4.86 MIL/uL (ref 4.22–5.81)
RDW: 15.7 % — ABNORMAL HIGH (ref 11.5–15.5)
WBC: 9.2 10*3/uL (ref 4.0–10.5)
nRBC: 0 % (ref 0.0–0.2)

## 2019-12-08 LAB — BASIC METABOLIC PANEL
Anion gap: 7 (ref 5–15)
BUN: 75 mg/dL — ABNORMAL HIGH (ref 8–23)
CO2: 30 mmol/L (ref 22–32)
Calcium: 9.4 mg/dL (ref 8.9–10.3)
Chloride: 102 mmol/L (ref 98–111)
Creatinine, Ser: 2.4 mg/dL — ABNORMAL HIGH (ref 0.61–1.24)
GFR calc non Af Amer: 24 mL/min — ABNORMAL LOW (ref >60)
Glucose, Bld: 105 mg/dL — ABNORMAL HIGH (ref 70–99)
Potassium: 5.5 mmol/L — ABNORMAL HIGH (ref 3.5–5.1)
Sodium: 139 mmol/L (ref 135–145)

## 2019-12-08 LAB — URIC ACID: Uric Acid, Serum: 9.7 mg/dL — ABNORMAL HIGH (ref 3.7–8.6)

## 2020-01-20 ENCOUNTER — Emergency Department
Admission: EM | Admit: 2020-01-20 | Discharge: 2020-01-20 | Disposition: A | Discharge status: Home / Self Care | Payer: Medicare Other | Attending: Emergency Medicine | Admitting: Emergency Medicine

## 2020-01-20 ENCOUNTER — Encounter: Payer: Self-pay | Admitting: Emergency Medicine

## 2020-01-20 ENCOUNTER — Other Ambulatory Visit: Payer: Self-pay

## 2020-01-20 DIAGNOSIS — Z7901 Long term (current) use of anticoagulants: Secondary | ICD-10-CM | POA: Diagnosis not present

## 2020-01-20 DIAGNOSIS — Z7982 Long term (current) use of aspirin: Secondary | ICD-10-CM | POA: Insufficient documentation

## 2020-01-20 DIAGNOSIS — Z794 Long term (current) use of insulin: Secondary | ICD-10-CM | POA: Insufficient documentation

## 2020-01-20 DIAGNOSIS — R1030 Lower abdominal pain, unspecified: Secondary | ICD-10-CM | POA: Diagnosis not present

## 2020-01-20 DIAGNOSIS — E1122 Type 2 diabetes mellitus with diabetic chronic kidney disease: Secondary | ICD-10-CM | POA: Diagnosis not present

## 2020-01-20 DIAGNOSIS — R339 Retention of urine, unspecified: Secondary | ICD-10-CM | POA: Insufficient documentation

## 2020-01-20 DIAGNOSIS — Z859 Personal history of malignant neoplasm, unspecified: Secondary | ICD-10-CM | POA: Insufficient documentation

## 2020-01-20 DIAGNOSIS — I129 Hypertensive chronic kidney disease with stage 1 through stage 4 chronic kidney disease, or unspecified chronic kidney disease: Secondary | ICD-10-CM | POA: Insufficient documentation

## 2020-01-20 DIAGNOSIS — N183 Chronic kidney disease, stage 3 unspecified: Secondary | ICD-10-CM | POA: Insufficient documentation

## 2020-01-20 DIAGNOSIS — Z87891 Personal history of nicotine dependence: Secondary | ICD-10-CM | POA: Insufficient documentation

## 2020-01-20 DIAGNOSIS — Z79899 Other long term (current) drug therapy: Secondary | ICD-10-CM | POA: Diagnosis not present

## 2020-01-20 LAB — BASIC METABOLIC PANEL
Anion gap: 10 (ref 5–15)
BUN: 47 mg/dL — ABNORMAL HIGH (ref 8–23)
CO2: 25 mmol/L (ref 22–32)
Calcium: 9 mg/dL (ref 8.9–10.3)
Chloride: 102 mmol/L (ref 98–111)
Creatinine, Ser: 1.98 mg/dL — ABNORMAL HIGH (ref 0.61–1.24)
GFR, Estimated: 32 mL/min — ABNORMAL LOW (ref >60)
Glucose, Bld: 128 mg/dL — ABNORMAL HIGH (ref 70–99)
Potassium: 4.6 mmol/L (ref 3.5–5.1)
Sodium: 137 mmol/L (ref 135–145)

## 2020-01-20 LAB — URINALYSIS, ROUTINE W REFLEX MICROSCOPIC
Bacteria, UA: NONE SEEN
Bilirubin Urine: NEGATIVE
Glucose, UA: 50 mg/dL — AB
Ketones, ur: NEGATIVE mg/dL
Leukocytes,Ua: NEGATIVE
Nitrite: NEGATIVE
Protein, ur: NEGATIVE mg/dL
Specific Gravity, Urine: 1.006 (ref 1.005–1.030)
Squamous Epithelial / HPF: NONE SEEN (ref 0–5)
pH: 5 (ref 5.0–8.0)

## 2020-01-20 NOTE — Discharge Instructions (Signed)
Please follow-up with your urologist or see Dr. Jeffie Pollock.  Let them know you had to have a Foley catheter placed because you could not urinate.  They should be out of see you in about a week and remove the catheter and see if he can urinate.  They may be able to help get your prostate shrunk down with medicines.  Please return for any further problems fever vomiting or weakness.  Please have Dr. Ouida Sills your medical doctor keep an eye on your kidney function.

## 2020-01-20 NOTE — ED Triage Notes (Signed)
Pt to ER states has not been able to pass urine since early this.  States took his diuretics this AM and is having pain at this time.

## 2020-01-20 NOTE — ED Provider Notes (Signed)
[facility] Emergency Department Provider Note   ____________________________________________   First MD Initiated Contact with Patient 01/20/20 1638     (approximate)  I have reviewed the triage vital signs and the nursing notes.   HISTORY  Chief Complaint Urinary Retention   HPI Hayden Martin is a 86 y.o. male patient reports he was unable to urinate since this morning.  He says he had something like this happen about 10 years ago and had to have a Foley placed and wear it for several days.  Symptoms then went away.  He denies any nausea vomiting fever chills or any other complaints.  He had some lower abdominal pain that went away when the Foley was placed.         Past Medical History:  Diagnosis Date  . Cancer ([place])   . Clotting disorder ([place])   . Diabetes mellitus without complication ([place])   . Heart disease   . Hypertension     Patient Active Problem List   Diagnosis Date Noted  . Abnormal SPEP 05/04/2019  . Bilateral edema of lower extremity 09/17/2017  . Hyperuricemia 06/23/2017  . Encounter for care related to vascular access port 06/23/2017  . Foot drop, right 04/26/2016  . Encounter for antineoplastic chemotherapy 03/08/2016  . Acute deep vein thrombosis (DVT) of popliteal vein of left lower extremity ([place]) 03/07/2016  . Pleural effusion 02/05/2016  . Chronic kidney disease, stage III (moderate) (HCC)   . DNR (do not resuscitate) discussion   . Palliative care by specialist   . Diffuse large B cell lymphoma ([place]) 01/21/2016  . Tumor lysis syndrome 01/15/2016  . Splenic mass 01/14/2016  . Adenopathy 01/14/2016  . HTN (hypertension), benign 09/11/2013    Past Surgical History:  Procedure Laterality Date  . HERNIA REPAIR    . PERIPHERAL VASCULAR CATHETERIZATION N/A 01/18/2016   Procedure: IVC Filter Insertion;  Surgeon: Katha Cabal, MD;  Location: [place] [facility];  Service: Cardiovascular;  Laterality: N/A;  .  PERIPHERAL VASCULAR CATHETERIZATION N/A 01/21/2016   Procedure: Glori Luis Cath Insertion;  Surgeon: Algernon Huxley, MD;  Location: [place] [facility];  Service: Cardiovascular;  Laterality: N/A;  . PORTA CATH REMOVAL N/A 01/07/2018   Procedure: PORTA CATH REMOVAL;  Surgeon: Algernon Huxley, MD;  Location: [place] [facility];  Service: Cardiovascular;  Laterality: N/A;    Prior to Admission medications   Medication Sig Start Date End Date Taking? Authorizing Provider  allopurinol (ZYLOPRIM) 100 MG tablet Take 0.5 tablets (50 mg total) by mouth daily. 09/07/19   Lequita Asal, MD  amLODipine (NORVASC) 5 MG tablet Take 10 mg by mouth daily.     [provider]  aspirin EC 81 MG tablet Take 81 mg by mouth daily.    [provider]  atorvastatin (LIPITOR) 40 MG tablet Take 20 mg by mouth daily.    [provider]  carvedilol (COREG) 6.25 MG tablet TAKE ONE (1) TABLET BY MOUTH TWO (2) TIMES DAILY 02/05/15   [provider]  furosemide (LASIX) 20 MG tablet Take 20 mg by mouth daily.     [provider]  Insulin Glargine (LANTUS [place]) Inject into the skin. Inject 36 units subcutaneously at bedtime.    [provider]  ketoconazole (NIZORAL) 2 % cream APPLY AT BEDTIME DAILY TO AFFECTED AREAS 12/27/14   [provider]  liraglutide (VICTOZA) 18 MG/3ML SOPN Inject into the skin daily.    [provider]  lisinopril (ZESTRIL) 10 MG tablet Take 10 mg by mouth daily.    [provider]  rivaroxaban (XARELTO) 20 MG TABS tablet Take 20 mg by mouth daily with supper.    [provider]    Allergies Codeine and Penicillins  History reviewed. No pertinent family history.  Social History Social History   Tobacco Use  . Smoking status: Former Smoker    Packs/day: 1.50    Quit date: 01/01/1986    Years since quitting: 34.0  . Smokeless tobacco: Former Systems developer  . Tobacco comment: Smoked for 20 years  Substance Use Topics  .  Alcohol use: Not on file  . Drug use: Not on file    Review of Systems  Constitutional: No fever/chills Eyes: No visual changes. ENT: No sore throat. Cardiovascular: Denies chest pain. Respiratory: Denies shortness of breath. Gastrointestinal: No abdominal pain.  No nausea, no vomiting.  No diarrhea.  No constipation. Genitourinary: Negative for dysuria. Musculoskeletal: Negative for back pain. Skin: Negative for rash. Neurological: Negative for headaches, focal weakness   ____________________________________________   PHYSICAL EXAM:  VITAL SIGNS: ED Triage Vitals [01/20/20 1432]  Enc Vitals Group     BP 136/67     Pulse Rate 87     Resp 18     Temp 98.8 F (37.1 C)     Temp Source Oral     SpO2 94 %     Weight 204 lb (92.5 kg)     Height 5\' 11"  (1.803 m)     Head Circumference      Peak Flow      Pain Score 9     Pain Loc      Pain Edu?      Excl. in [place]?     Constitutional: Alert and oriented. Well appearing and in no acute distress. Eyes: Conjunctivae are normal. Head: Atraumatic. Nose: No congestion/rhinnorhea. Mouth/Throat: Mucous membranes are moist.  Oropharynx non-erythematous. Neck: No stridor.  Cardiovascular: Normal rate, regular rhythm. Grossly normal heart sounds.  Good peripheral circulation. Respiratory: Normal respiratory effort.  No retractions. Lungs CTAB. Gastrointestinal: Soft and nontender. No distention. No abdominal bruits.  Musculoskeletal: No lower extremity tenderness nor edema.   Neurologic:  Normal speech and language. No gross focal neurologic deficits are appreciated.  Skin:  Skin is warm, dry and intact. No rash noted.   ____________________________________________   LABS (all labs ordered are listed, but only abnormal results are displayed)  Labs Reviewed  URINALYSIS, ROUTINE W REFLEX MICROSCOPIC - Abnormal; Notable for the following components:      Result Value   Color, Urine STRAW (*)    APPearance CLEAR (*)     Glucose, UA 50 (*)    Hgb urine dipstick SMALL (*)    All other components within normal limits  BASIC METABOLIC PANEL - Abnormal; Notable for the following components:   Glucose, Bld 128 (*)    BUN 47 (*)    Creatinine, Ser 1.98 (*)    GFR, Estimated 32 (*)    All other components within normal limits   ____________________________________________  EKG   ____________________________________________  RADIOLOGY Gertha Calkin, personally viewed and evaluated these images (plain radiographs) as part of my medical decision making, as well as reviewing the written report by the radiologist.  ED MD interpretatio Official radiology report(s): No results found.  ____________________________________________   PROCEDURES  Procedure(s) performed (including Critical Care):  Procedures   ____________________________________________   INITIAL IMPRESSION / ASSESSMENT AND PLAN /  ED COURSE  Patient with worsening renal function over the last year.  This is possibly due to or increasing prostate size.  We have placed his Foley.  His urine is clear.  He is having no further symptoms.  His GFR is actually gone up slightly today compared to previously.  I will let him go he will follow here with urology.  I will have him follow-up with Dr. Ouida Sills his medical doctor to keep an eye on his renal function as well.      ____________________________________________   FINAL CLINICAL IMPRESSION(S) / ED DIAGNOSES  Final diagnoses:  Urinary retention     ED Discharge Orders    None      *Please note:  Hayden Martin was evaluated in Emergency Department on 01/20/2020 for the symptoms described in the history of present illness. He was evaluated in the context of the global COVID-19 pandemic, which necessitated consideration that the patient might be at risk for infection with the SARS-CoV-2 virus that causes COVID-19. Institutional protocols and algorithms that pertain to the  evaluation of patients at risk for COVID-19 are in a state of rapid change based on information released by regulatory bodies including the CDC and federal and state organizations. These policies and algorithms were followed during the patient's care in the ED.  Some ED evaluations and interventions may be delayed as a result of limited staffing during and the pandemic.*   Note:  This document was prepared using Dragon voice recognition software and may include unintentional dictation errors.    Nena Polio, MD 01/20/20 1816

## 2020-01-20 NOTE — ED Notes (Signed)
Patient given leg bag and instructions on changing bag.  Demonstrated understanding

## 2020-02-02 ENCOUNTER — Other Ambulatory Visit: Payer: Self-pay

## 2020-02-02 ENCOUNTER — Ambulatory Visit (INDEPENDENT_AMBULATORY_CARE_PROVIDER_SITE_OTHER): Payer: Medicare Other | Admitting: Physician Assistant

## 2020-02-02 ENCOUNTER — Encounter: Payer: Self-pay | Admitting: Urology

## 2020-02-02 ENCOUNTER — Ambulatory Visit: Payer: Medicare Other | Admitting: Urology

## 2020-02-02 VITALS — BP 125/70 | HR 97 | Ht 71.0 in | Wt 202.0 lb

## 2020-02-02 DIAGNOSIS — N138 Other obstructive and reflux uropathy: Secondary | ICD-10-CM

## 2020-02-02 DIAGNOSIS — R339 Retention of urine, unspecified: Secondary | ICD-10-CM | POA: Diagnosis not present

## 2020-02-02 DIAGNOSIS — N401 Enlarged prostate with lower urinary tract symptoms: Secondary | ICD-10-CM

## 2020-02-02 LAB — BLADDER SCAN AMB NON-IMAGING

## 2020-02-02 MED ORDER — SILODOSIN 4 MG PO CAPS: 4.0000 mg | ORAL_CAPSULE | Freq: Every day | ORAL | 3 refills | Status: DC

## 2020-02-02 MED ORDER — LEVOFLOXACIN 500 MG PO TABS
500.0000 mg | ORAL_TABLET | Freq: Once | ORAL | Status: AC
  Administered 2020-02-02: 500 mg via ORAL

## 2020-02-02 NOTE — Progress Notes (Signed)
Afternoon follow-up  Patient returned to clinic this afternoon for repeat PVR. He reports drinking approximately 32oz of fluid. He has been able to urinate. PVR 36mL.  Results for orders placed or performed in visit on 02/02/20  BLADDER SCAN AMB NON-IMAGING  Result Value Ref Range   Scan Result 80ml     Voiding trial passed. Counseled patient to continue silodosin and contact the office if he develops orthostasis.  Follow up: Return in about 4 weeks (around 03/01/2020) for Repeat PVR.

## 2020-02-02 NOTE — Progress Notes (Signed)
02/02/2020 9:22 AM   [name] 16-May-1931 [id number]  Referring provider: Kirk Ruths, MD [facility] [place],  [place] [address]  Chief Complaint  Patient presents with  . Urinary Retention    HPI: Hayden Martin is an 86 y.o. male who presents for evaluation of urinary retention.   ED visit 01/20/2020 with acute urinary retention  Similar episode approximately 10 years prior after shoulder surgery  Foley catheter placed  Urinalysis was unremarkable  On no BPH meds  RUS 02/2019 showed marked prostate enlargement with estimated volume at 244 g; no significant upper tract abnormalities  Denies urologic follow-up in the last 10 years   PMH: Past Medical History:  Diagnosis Date  . Cancer ([place])   . Clotting disorder ([place])   . Diabetes mellitus without complication ([place])   . Heart disease   . Hypertension     Surgical History: Past Surgical History:  Procedure Laterality Date  . HERNIA REPAIR    . PERIPHERAL VASCULAR CATHETERIZATION N/A 01/18/2016   Procedure: IVC Filter Insertion;  Surgeon: Katha Cabal, MD;  Location: [place] [facility];  Service: Cardiovascular;  Laterality: N/A;  . PERIPHERAL VASCULAR CATHETERIZATION N/A 01/21/2016   Procedure: Glori Luis Cath Insertion;  Surgeon: Algernon Huxley, MD;  Location: [place] [facility];  Service: Cardiovascular;  Laterality: N/A;  . PORTA CATH REMOVAL N/A 01/07/2018   Procedure: PORTA CATH REMOVAL;  Surgeon: Algernon Huxley, MD;  Location: [place] [facility];  Service: Cardiovascular;  Laterality: N/A;    Home Medications:  Allergies as of 02/02/2020      Reactions   Codeine Itching   Penicillins Itching      Medication List       Accurate as of February 02, 2020  9:22 AM. If you have any questions, ask your nurse or doctor.        STOP taking these medications   lisinopril 10 MG tablet Commonly known as: ZESTRIL Stopped by: Abbie Sons, MD     TAKE  these medications   allopurinol 100 MG tablet Commonly known as: Zyloprim Take 0.5 tablets (50 mg total) by mouth daily.   amLODipine 5 MG tablet Commonly known as: NORVASC Take 10 mg by mouth daily.   aspirin EC 81 MG tablet Take 81 mg by mouth daily.   atorvastatin 40 MG tablet Commonly known as: LIPITOR Take 20 mg by mouth daily.   carvedilol 6.25 MG tablet Commonly known as: COREG TAKE ONE (1) TABLET BY MOUTH TWO (2) TIMES DAILY   furosemide 20 MG tablet Commonly known as: LASIX Take 20 mg by mouth daily.   ketoconazole 2 % cream Commonly known as: NIZORAL APPLY AT BEDTIME DAILY TO AFFECTED AREAS   LANTUS [place] Inject into the skin. Inject 36 units subcutaneously at bedtime.   liraglutide 18 MG/3ML Sopn Commonly known as: VICTOZA Inject into the skin daily.   rivaroxaban 20 MG Tabs tablet Commonly known as: XARELTO Take 20 mg by mouth daily with supper.   torsemide 20 MG tablet Commonly known as: DEMADEX Take 20 mg by mouth 2 (two) times daily.       Allergies:  Allergies  Allergen Reactions  . Codeine Itching  . Penicillins Itching    Family History: History reviewed. No pertinent family history.  Social History:  reports that he quit smoking about 34 years ago. He smoked 1.50 packs per day. He has quit using smokeless tobacco. No history  on file for alcohol use and drug use.   Physical Exam: BP 125/70   Pulse 97   Ht 5\' 11"  (1.803 m)   Wt 202 lb (91.6 kg)   BMI 28.17 kg/m   Constitutional:  Alert, No acute distress. HEENT: [place] AT, moist mucus membranes.  Trachea midline, no masses. Cardiovascular: No clubbing, cyanosis, or edema. Respiratory: Normal respiratory effort, no increased work of breathing. GI: Abdomen is soft, nontender, nondistended, no abdominal masses GU: Foley catheter draining amber urine; phallus without lesions; prostate 100+ grams with midline nodule Skin: No rashes, bruises or suspicious lesions. Neurologic: Grossly  intact, no focal deficits, moving all 4 extremities. Psychiatric: Normal mood and affect.    Assessment & Plan:    1.  BPH with urinary retention  Marked prostate enlargement  Cath removed today for voiding trial; has follow-up this afternoon for bladder scan  Based on history urinary tension and marked prostate enlargement would recommend starting an alpha-blocker and Rx silodosin sent to pharmacy  If able to void successfully recommend a 1 month follow-up for repeat PVR and would also start finasteride at that visit  Based on age will follow prostate nodule  Given Levaquin 500 mg p.o. after catheter removal   Abbie Sons, MD  [facility] [address] [place], [place] [address] [phone]

## 2020-03-05 ENCOUNTER — Other Ambulatory Visit: Payer: Self-pay

## 2020-03-05 ENCOUNTER — Encounter: Payer: Self-pay | Admitting: Physician Assistant

## 2020-03-05 ENCOUNTER — Ambulatory Visit: Payer: Medicare Other | Admitting: Physician Assistant

## 2020-03-05 VITALS — BP 113/69 | HR 72 | Ht 71.0 in | Wt 205.0 lb

## 2020-03-05 DIAGNOSIS — N401 Enlarged prostate with lower urinary tract symptoms: Secondary | ICD-10-CM | POA: Diagnosis not present

## 2020-03-05 DIAGNOSIS — N138 Other obstructive and reflux uropathy: Secondary | ICD-10-CM

## 2020-03-05 LAB — BLADDER SCAN AMB NON-IMAGING: Scan Result: 43

## 2020-03-05 MED ORDER — FINASTERIDE 5 MG PO TABS: 5.0000 mg | ORAL_TABLET | Freq: Every day | ORAL | 11 refills | Status: DC

## 2020-03-05 MED ORDER — SILODOSIN 4 MG PO CAPS: 4.0000 mg | ORAL_CAPSULE | Freq: Every day | ORAL | 11 refills | Status: AC

## 2020-03-05 NOTE — Patient Instructions (Signed)
Continue silodosin and start finasteride. I have sent a one-year supply of these prescriptions to the [facility] in [facility]. Please show the pharmacy staff the coupons I've provided you today when you go pick them up to bring the cost of these medications down further.  When you go see your [facility] provider next month, ask if they can prescribe these medications for you as well to make them more affordable.

## 2020-03-05 NOTE — Progress Notes (Signed)
03/05/2020 8:49 AM   Early Chars 08-19-1931 [id number]  CC: Chief Complaint  Patient presents with  . Benign Prostatic Hypertrophy   HPI: Hayden Martin is a 87 y.o. male with PMH BPH with urinary retention who passed a voiding trial 1 month ago, now on silodosin, and midline prostate nodule who presents today for repeat PVR.  Today he reports doing well since his last clinic visit.  He has been tolerating the silodosin well without orthostasis.  He takes it at bedtime as recommended by the pharmacist.  He notes that silodosin is $73 a month and wonders if there is a cheaper alternative for him.  No acute concerns today.  PVR 35mL.  PMH: Past Medical History:  Diagnosis Date  . Cancer (HCC)   . Clotting disorder (HCC)   . Diabetes mellitus without complication (HCC)   . Heart disease   . Hypertension     Surgical History: Past Surgical History:  Procedure Laterality Date  . HERNIA REPAIR    . PERIPHERAL VASCULAR CATHETERIZATION N/A 01/18/2016   Procedure: IVC Filter Insertion;  Surgeon: Renford Dills, MD;  Location: [facility];  Service: Cardiovascular;  Laterality: N/A;  . PERIPHERAL VASCULAR CATHETERIZATION N/A 01/21/2016   Procedure: Shelda Pal Cath Insertion;  Surgeon: Annice Needy, MD;  Location: [facility];  Service: Cardiovascular;  Laterality: N/A;  . PORTA CATH REMOVAL N/A 01/07/2018   Procedure: PORTA CATH REMOVAL;  Surgeon: Annice Needy, MD;  Location: [facility];  Service: Cardiovascular;  Laterality: N/A;    Home Medications:  Allergies as of 03/05/2020      Reactions   Codeine Itching   Penicillins Itching      Medication List       Accurate as of March 05, 2020  8:49 AM. If you have any questions, ask your nurse or doctor.        allopurinol 100 MG tablet Commonly known as: Zyloprim Take 0.5 tablets (50 mg total) by mouth daily.   amLODipine 5 MG tablet Commonly known as: NORVASC Take 10 mg by mouth  daily.   aspirin EC 81 MG tablet Take 81 mg by mouth daily.   atorvastatin 40 MG tablet Commonly known as: LIPITOR Take 20 mg by mouth daily.   carvedilol 6.25 MG tablet Commonly known as: COREG TAKE ONE (1) TABLET BY MOUTH TWO (2) TIMES DAILY   furosemide 20 MG tablet Commonly known as: LASIX Take 20 mg by mouth daily.   ketoconazole 2 % cream Commonly known as: NIZORAL APPLY AT BEDTIME DAILY TO AFFECTED AREAS   LANTUS [place] Inject into the skin. Inject 36 units subcutaneously at bedtime.   liraglutide 18 MG/3ML Sopn Commonly known as: VICTOZA Inject into the skin daily.   rivaroxaban 20 MG Tabs tablet Commonly known as: XARELTO Take 20 mg by mouth daily with supper.   silodosin 4 MG Caps capsule Commonly known as: RAPAFLO Take 1 capsule (4 mg total) by mouth daily with breakfast.   torsemide 20 MG tablet Commonly known as: DEMADEX Take 20 mg by mouth 2 (two) times daily.       Allergies:  Allergies  Allergen Reactions  . Codeine Itching  . Penicillins Itching    Family History: No family history on file.  Social History:   reports that he quit smoking about 34 years ago. He smoked 1.50 packs per day. He has quit using smokeless tobacco. No history on file for alcohol use and  drug use.  Physical Exam: BP 113/69   Pulse 72   Ht 5\' 11"  (1.803 m)   Wt 205 lb (93 kg)   BMI 28.59 kg/m   Constitutional:  Alert and oriented, no acute distress, nontoxic appearing HEENT: [place], AT Cardiovascular: No clubbing, cyanosis, or edema Respiratory: Normal respiratory effort, no increased work of breathing Skin: No rashes, bruises or suspicious lesions Neurologic: Grossly intact, no focal deficits, moving all 4 extremities Psychiatric: Normal mood and affect  Laboratory Data: Results for orders placed or performed in visit on 03/05/20  Bladder Scan (Post Void Residual) in office  Result Value Ref Range   Scan Result 43    Assessment & Plan:   1. Benign prostatic  hyperplasia with urinary obstruction Patient continues to void well on silodosin without orthostasis.  Will start finasteride at this time to reduce his risk of recurrent retention.  I explained this medication works to shrink the size of the prostate over time but common side effects include erectile dysfunction.  He is willing to proceed.  Silodosin appears to be cheaper at [facility] pharmacy.  I am refilling finasteride and silodosin for 1 year supply and provided GoodRx coupons for him today.  Patient may also attempt to have these filled at the [facility]. - Bladder Scan (Post Void Residual) in office - finasteride (PROSCAR) 5 MG tablet; Take 1 tablet (5 mg total) by mouth daily.  Dispense: 30 tablet; Refill: 11 - silodosin (RAPAFLO) 4 MG CAPS capsule; Take 1 capsule (4 mg total) by mouth daily with breakfast.  Dispense: 30 capsule; Refill: 11   Return in about 1 year (around 03/05/2021) for Annual DRE/IPSS/PVR with Dr. 05/03/2021.  Lonna Cobb, PA-C  [facility] [address] [place], [place] [phone]

## 2020-03-14 DIAGNOSIS — D649 Anemia, unspecified: Secondary | ICD-10-CM | POA: Insufficient documentation

## 2020-03-14 NOTE — Progress Notes (Signed)
 [facility]  [address], [address] [place], [place] [address] Phone: [phone]  Fax: [phone]  Clinic Day: 03/15/20  Referring physician: Anderson, Marshall W, MD  Chief Complaint: Hayden Martin is a 87 y.o. 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  HPI: The patient was last seen in the medical oncology clinic on 09/07/2019. At that time, he noted chronic shortness of breath since COVID-19. He denied any B symptoms. Exam revealed no adenopathy or hepatosplenomegaly. He had chronic bilateral lower extremity edema. Hematocrit was 38.5, hemoglobin 12.6, MCV 91.7, platelets 155,000, WBC 8,100. BUN was 45. Creatinine was 1.96 (CrCl 30 ml/min). Calcium was 8.8. Uric acid was 9.6. LDH was 143. He continued Xarelto. He was to restart allopurinol 50 mg p.o. daily.  Echocardiogram on 09/28/2019 revealed an EF of >55%.  The patient saw Dr. Fath on 02/01/2020. Discussion were held regarding potential TAVR candidacy. He continued Xarelto. Follow-up was planned for 3 months.  The patient established care with Dr. Harrison, [facility], on 03/09/2020. He is scheduled for admission on 03/19/2020 for IV hydration in advance of CTA and heart catheterization in the setting of chronic kidney disease.   Labs on 12/08/2019 revealed a hematocrit of 43.9, hemoglobin 14.2, platelets 140,000, WBC 9,200 (ANC 5900). Potassium was 5.5. BUN was 75 and creatinine 2.40 (CrCl 24 ml/min). Uric acid was 9.7.  During the interim, he has been "good." He is short of breath because he needs a valve replacement. He is still taking Xarelto. He is eating well and his diabetes is well managed. He has occasional leg and foot cramps at night. His balance is poor; he uses a cane.  He denies fevers, sweats, weight loss, lumps or bumps, bruising, and bleeding.  Around Thanksgiving, he could  not pass urine and had a catheter for 2 weeks. He was given one dose of an antibiotics.  He takes allopurinol.   Past Medical History:  Diagnosis Date  . Cancer (HCC)   . Clotting disorder (HCC)   . Diabetes mellitus without complication (HCC)   . Heart disease   . Hypertension     Past Surgical History:  Procedure Laterality Date  . HERNIA REPAIR    . PERIPHERAL VASCULAR CATHETERIZATION N/A 01/18/2016   Procedure: IVC Filter Insertion;  Surgeon: Gregory G Schnier, MD;  Location: [facility] INVASIVE CV LAB;  Service: Cardiovascular;  Laterality: N/A;  . PERIPHERAL VASCULAR CATHETERIZATION N/A 01/21/2016   Procedure: Porta Cath Insertion;  Surgeon: Jason S Dew, MD;  Location: [facility] INVASIVE CV LAB;  Service: Cardiovascular;  Laterality: N/A;  . PORTA CATH REMOVAL N/A 01/07/2018   Procedure: PORTA CATH REMOVAL;  Surgeon: Dew, Jason S, MD;  Location: [facility] INVASIVE CV LAB;  Service: Cardiovascular;  Laterality: N/A;    History reviewed. No pertinent family history.  Social History:  reports that he quit smoking about 34 years ago. He smoked 1.50 packs per day. He has quit using smokeless tobacco. No history on file for alcohol use and drug use. He was in the [employer]. The patient is a retired police officer.He lives by himself.He was discharged from [facility] on 02/20/2016. He has 3 children who live locally. He recently bought a new car ([employer] [place]).He describes recently being a passenger in a [employer] airplane. He likes to play poker. The patient is alone today.  Allergies:  Allergies  Allergen Reactions  .   Codeine Itching  . Penicillins Itching    Current Medications: Current Outpatient Medications  Medication Sig Dispense Refill  . allopurinol (ZYLOPRIM) 100 MG tablet Take 0.5 tablets (50 mg total) by mouth daily. 30 tablet 2  . amLODipine (NORVASC) 5 MG tablet Take 10 mg by mouth daily.     [name] aspirin EC 81 MG tablet Take 81 mg by mouth daily.    [name] atorvastatin (LIPITOR) 40  MG tablet Take 20 mg by mouth daily.    . carvedilol (COREG) 6.25 MG tablet TAKE ONE (1) TABLET BY MOUTH TWO (2) TIMES DAILY    . finasteride (PROSCAR) 5 MG tablet Take 1 tablet (5 mg total) by mouth daily. 30 tablet 11  . furosemide (LASIX) 20 MG tablet Take 20 mg by mouth daily.     . Insulin Glargine (LANTUS [place]) Inject into the skin. Inject 36 units subcutaneously at bedtime.    [name] ketoconazole (NIZORAL) 2 % cream APPLY AT BEDTIME DAILY TO AFFECTED AREAS    . liraglutide (VICTOZA) 18 MG/3ML SOPN Inject into the skin daily.    . rivaroxaban (XARELTO) 20 MG TABS tablet Take 20 mg by mouth daily with supper.    . silodosin (RAPAFLO) 4 MG CAPS capsule Take 1 capsule (4 mg total) by mouth daily with breakfast. 30 capsule 11  . torsemide (DEMADEX) 20 MG tablet Take 20 mg by mouth 2 (two) times daily.     No current facility-administered medications for this visit.   Facility-Administered Medications Ordered in Other Visits  Medication Dose Route Frequency Provider Last Rate Last Admin  . sodium chloride flush (NS) 0.9 % injection 10 mL  10 mL Intravenous PRN Cammie Sickle, MD   10 mL at 03/10/16 0949    Review of Systems  Constitutional: Positive for malaise/fatigue and weight loss (17 lbs). Negative for chills, diaphoresis and fever.  HENT: Positive for hearing loss (hearing aid). Negative for congestion, ear discharge, ear pain, nosebleeds, sinus pain, sore throat and tinnitus.   Eyes: Negative.  Negative for blurred vision, double vision and photophobia.  Respiratory: Positive for shortness of breath. Negative for cough, hemoptysis and sputum production.   Cardiovascular: Negative for chest pain, palpitations, orthopnea, leg swelling and PND.  Gastrointestinal: Negative.  Negative for abdominal pain, blood in stool, constipation, diarrhea, heartburn, melena, nausea and vomiting.  Genitourinary: Negative.  Negative for dysuria, frequency, hematuria and urgency.  Musculoskeletal:  Positive for back pain (chronic). Negative for falls, joint pain, myalgias and neck pain.       Chronic ankle and foot cramps at night.  Skin: Negative.  Negative for itching and rash.  Neurological: Negative for dizziness, tremors, sensory change, speech change, focal weakness, weakness and headaches.       Poor balance, uses a cane  Endo/Heme/Allergies: Does not bruise/bleed easily (on Xarelto).       Diabetes.  Psychiatric/Behavioral: Negative.  Negative for depression and memory loss. The patient is not nervous/anxious and does not have insomnia.   All other systems reviewed and are negative.  Performance status (ECOG): 1  Vitals Blood pressure (!) 136/37, pulse 65, temperature (!) 96 F (35.6 C), temperature source Tympanic, resp. rate 16, weight 208 lb 12.4 oz (94.7 kg), SpO2 98 %.   Physical Exam Vitals and nursing note reviewed.  Constitutional:      General: He is not in acute distress.    Appearance: He is well-developed. He is not diaphoretic.     Comments: Cane by his side  HENT:     Head: Normocephalic and atraumatic.     Comments: Gray hair.    Mouth/Throat:     Mouth: Mucous membranes are moist.     Pharynx: Oropharynx is clear. No oropharyngeal exudate.  Eyes:     General: No scleral icterus.    Extraocular Movements: Extraocular movements intact.     Conjunctiva/sclera: Conjunctivae normal.     Pupils: Pupils are equal, round, and reactive to light.     Comments: Blue eyes.   Neck:     Vascular: No JVD.  Cardiovascular:     Rate and Rhythm: Normal rate and regular rhythm.     Heart sounds: Murmur (aortic) heard.    Pulmonary:     Effort: Pulmonary effort is normal. No respiratory distress.     Breath sounds: Normal breath sounds. No wheezing or rales.  Chest:     Chest wall: No tenderness.  Breasts:     Right: No axillary adenopathy or supraclavicular adenopathy.     Left: No axillary adenopathy or supraclavicular adenopathy.    Abdominal:      General: Bowel sounds are normal. There is no distension.     Palpations: Abdomen is soft. There is no hepatomegaly, splenomegaly or mass.     Tenderness: There is no abdominal tenderness. There is no guarding or rebound.  Musculoskeletal:        General: No tenderness. Normal range of motion.     Cervical back: Normal range of motion and neck supple.     Right lower leg: Edema (trace, ankle) present.     Left lower leg: Edema (1-2+, ankle) present.  Lymphadenopathy:     Head:     Right side of head: No preauricular, posterior auricular or occipital adenopathy.     Left side of head: No preauricular, posterior auricular or occipital adenopathy.     Cervical: No cervical adenopathy.     Upper Body:     Right upper body: No supraclavicular or axillary adenopathy.     Left upper body: No supraclavicular or axillary adenopathy.     Lower Body: No right inguinal adenopathy. No left inguinal adenopathy.  Skin:    General: Skin is warm and dry.     Findings: No erythema.  Neurological:     Mental Status: He is alert and oriented to person, place, and time.  Psychiatric:        Behavior: Behavior normal.        Thought Content: Thought content normal.        Judgment: Judgment normal.    Appointment on 03/15/2020  Component Date Value Ref Range Status  . Uric Acid, Serum 03/15/2020 8.6  3.7 - 8.6 mg/dL Final   Performed at [facility], [address] [place], [place] [address]  . LDH 03/15/2020 145  98 - 192 U/L Final   Performed at [facility], [address] [place], [place] [address]  . Sodium 03/15/2020 141  135 - 145 mmol/L Final  . Potassium 03/15/2020 4.6  3.5 - 5.1 mmol/L Final  . Chloride 03/15/2020 103  98 - 111 mmol/L Final  . CO2 03/15/2020 34* 22 - 32 mmol/L Final  . Glucose, Bld 03/15/2020 95  70 - 99 mg/dL Final   Glucose reference range applies only to samples taken after fasting for at least 8 hours.  . BUN 03/15/2020 53* 8 - 23 mg/dL  Final  . Creatinine, Ser 03/15/2020 1.98* 0.61 - 1.24 mg/dL   Final  . Calcium 03/15/2020 8.9  8.9 - 10.3 mg/dL Final  . Total Protein 03/15/2020 7.3  6.5 - 8.1 g/dL Final  . Albumin 03/15/2020 3.9  3.5 - 5.0 g/dL Final  . AST 03/15/2020 18  15 - 41 U/L Final  . ALT 03/15/2020 30  0 - 44 U/L Final  . Alkaline Phosphatase 03/15/2020 42  38 - 126 U/L Final  . Total Bilirubin 03/15/2020 0.6  0.3 - 1.2 mg/dL Final  . GFR, Estimated 03/15/2020 32* >60 mL/min Final   Comment: (NOTE) Calculated using the CKD-EPI Creatinine Equation (2021)   . Anion gap 03/15/2020 4* 5 - 15 Final   Performed at [facility], [address] [place], [place] [address]  . WBC 03/15/2020 8.4  4.0 - 10.5 K/uL Final  . RBC 03/15/2020 4.18* 4.22 - 5.81 MIL/uL Final  . Hemoglobin 03/15/2020 12.6* 13.0 - 17.0 g/dL Final  . HCT 03/15/2020 40.2  39.0 - 52.0 % Final  . MCV 03/15/2020 96.2  80.0 - 100.0 fL Final  . MCH 03/15/2020 30.1  26.0 - 34.0 pg Final  . MCHC 03/15/2020 31.3  30.0 - 36.0 g/dL Final  . RDW 03/15/2020 14.6  11.5 - 15.5 % Final  . Platelets 03/15/2020 130* 150 - 400 K/uL Final  . nRBC 03/15/2020 0.0  0.0 - 0.2 % Final  . Neutrophils Relative % 03/15/2020 63  % Final  . Neutro Abs 03/15/2020 5.4  1.7 - 7.7 K/uL Final  . Lymphocytes Relative 03/15/2020 24  % Final  . Lymphs Abs 03/15/2020 2.0  0.7 - 4.0 K/uL Final  . Monocytes Relative 03/15/2020 9  % Final  . Monocytes Absolute 03/15/2020 0.7  0.1 - 1.0 K/uL Final  . Eosinophils Relative 03/15/2020 3  % Final  . Eosinophils Absolute 03/15/2020 0.3  0.0 - 0.5 K/uL Final  . Basophils Relative 03/15/2020 1  % Final  . Basophils Absolute 03/15/2020 0.0  0.0 - 0.1 K/uL Final  . Immature Granulocytes 03/15/2020 0  % Final  . Abs Immature Granulocytes 03/15/2020 0.03  0.00 - 0.07 K/uL Final   Performed at [facility], [address] [place], [place] [address]    Assessment:  Hayden Martin is a 87 y.o. male with stage IIIBS  diffuse large B cell lymphoma. He presented with a 30-35 pound weight loss in 2-3 months.  CT-guided retroperitoneal lymph node biopsyon 01/21/2016 revealed a CD30 positive large B-cell lymphoma. Immunohistochemical studies were positive for CD20, CD30, BCL-2, BCL 6, Ki-67 (>90%) and negative for CD10 and cyclin D1.  Bone marrow biopsyon 01/21/2016 revealed no evidence of lymphoma. Flow cytometry was negative. Cytogenetics were normal (46, XY).  Abdomen and pelvic CTscan on 01/07/2016 revealed multiple solid masses within the spleen. There was extensive retroperitoneal, portal and mesenteric lymphadenopathy. Retrocrural node measured 2.1 x 2.0 cm. Periportal node measured 2.6 x 5.8 cm. Confluent nodal mass is in the retroperitoneum surrounding the great vessels measured 5.6 x 3.8 cmon the left and 5.3 x 2.9 cm on the right. Left iliac node measured 1.7 x 2.6 cm. There was possible pleural disease on the left. The prostate was markedly enlarged. There was no disease in the liver.  PET scan on 01/16/2016 revealed extensive hypermetabolic adenopathy within the neck, chest, abdomen and pelvis. The spleen was enlarged with multi focal hypermetabolic splenic lesions There was evidence of transperitoneal spread of tumor within the upper abdomen. There were bilateral pleural effusions.  Hepatitis serologieswere negative on   01/16/2016. G6PD testingnegative. Echoon 01/18/2016 revealed an EF of 55-60%.   He received 6 cycles of mini-RCHOP(01/23/2016 - 05/16/2016). Cycle #1 was complicated bytumor lysis syndrome, leukopenia, and symptomatic pleural effusions. Diagnostic and 1 liter therapeutic left sided thoracentesison 02/08/2016 revealed involvement with lymphoma. He received OnPro Neulastawith cycle #2 - #6.  PET scanon 04/23/2016 revealed an excellent response to treatment. The extensive adenopathy seen on the prior PET-CT showedremarkable improvement. There was  noresidual metabolically active lymphoma is identified. There was a moderate left pleural effusion and small right pleural effusion.  PET scanon 06/06/2016 revealed continued improvement and near resolution. There was only minimal retroperitoneal nodularity/adenopathy, much of which likely represents a residuum from previously treated lymphoma. The residual periaortic lymph node measured 1.2 cm in short axis with maximum SUV 2.2 (formerly 1.4 cm and 3.6, respectively). The minimal residual nodal activity was Deauville 3.  He has a history of left lower extremity thrombosis. He has had 2 clots 1 year apart and is on life long anticoagulation. Bilateral lower extremity duplexon 01/16/2016 revealed DVT in the left lower extremity involving the calf veins and the left popliteal vein. IVC filterwas placed on 01/18/2016. 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 There was interval resolution of the left calf vein DVT. He is on Xarelto.  He has chronic renal insufficiency(Cr 1.3- 1.94).  SPEP on 01/31/2019 revealed a poorly defined band of restricted protein mobility in the gamma globulins.  M-spike was 0 and free light chain ratio was 1.76 (normal) on 05/04/2019.  Chest CT angiogramon 02/02/2016 revealed no evidence of pulmonary emboli. There were bilateral pleural effusions(moderate size left and small to moderate right).  Symptomatically, he feels "good." He notes shortness of breath because he needs an aortic valve replacement.  He denies any B symptoms.  Exam reveals no adenopathy or hepatosplenomegaly.  Plan: 1.   Labs today: CBC with diff, CMP, LDH, uric acid. 2.   Stage IIIB diffuse large B cell lymphoma Clinically, he is doing well  Exam reveals no adenopathy or hepatosplenomegaly             Hematocrit is 40.2, hemoglobin 12.6, platelets 130,000 and  WBC 8400.  LDH is 145 (normal).  Uric acid is 8.6 (normal) on allopurinol.   Continue allopurinol 50 mg a day.  Continue surveillance. 3. Left lower extremity thrombosis He has an IVC filter in place.  He continues Xarelto.  He denies any excess bruising or bleeding. 4.Mild normocytic anemia  Hematocrit 40.2.  Hemoglobin 12.6.  MCV 86.2.  Ferritin was 115 and iron saturation of 19% and a TIBC of 298 on 09/07/2019.   He is felt to have anemia of chronic renal disease (creatinine 1.98). 5.   RTC in 6 months for MD assessment and labs (CBC with diff, CMP, LDH, uric acid).    I discussed the assessment and treatment plan with the patient.  The patient was provided an opportunity to ask questions and all were answered.  The patient agreed with the plan and demonstrated an understanding of the instructions.  The patient was advised to call back if the symptoms worsen or if the condition fails to improve as anticipated.  I provided 11 minutes of face-to-face time during this this encounter and > 50% was spent counseling as documented under my assessment and plan.  An additional 10 minutes were spent reviewing his chart (Epic and Care Everywhere) including notes, labs, and  imaging studies.    Lequita Asal, MD, PhD 03/15/2020, 5:00 PM  I, Mirian Mo Tufford, am acting as Education administrator for [employer]. Mike Gip, MD, PhD.  I, Lige Lakeman C. Mike Gip, MD, have reviewed the above documentation for accuracy and completeness, and I agree with the above.

## 2020-03-15 ENCOUNTER — Inpatient Hospital Stay (HOSPITAL_BASED_OUTPATIENT_CLINIC_OR_DEPARTMENT_OTHER): Payer: Medicare Other | Admitting: Hematology and Oncology

## 2020-03-15 ENCOUNTER — Inpatient Hospital Stay: Payer: Medicare Other | Attending: Hematology and Oncology

## 2020-03-15 ENCOUNTER — Encounter: Payer: Self-pay | Admitting: Hematology and Oncology

## 2020-03-15 ENCOUNTER — Telehealth: Payer: Self-pay | Admitting: Hematology and Oncology

## 2020-03-15 ENCOUNTER — Other Ambulatory Visit: Payer: Self-pay

## 2020-03-15 VITALS — BP 136/37 | HR 65 | Temp 96.0°F | Resp 16 | Wt 208.8 lb

## 2020-03-15 DIAGNOSIS — C8333 Diffuse large B-cell lymphoma, intra-abdominal lymph nodes: Secondary | ICD-10-CM

## 2020-03-15 DIAGNOSIS — E119 Type 2 diabetes mellitus without complications: Secondary | ICD-10-CM | POA: Insufficient documentation

## 2020-03-15 DIAGNOSIS — N189 Chronic kidney disease, unspecified: Secondary | ICD-10-CM

## 2020-03-15 DIAGNOSIS — Z8572 Personal history of non-Hodgkin lymphomas: Secondary | ICD-10-CM | POA: Insufficient documentation

## 2020-03-15 DIAGNOSIS — Z9221 Personal history of antineoplastic chemotherapy: Secondary | ICD-10-CM | POA: Diagnosis not present

## 2020-03-15 DIAGNOSIS — Z794 Long term (current) use of insulin: Secondary | ICD-10-CM | POA: Insufficient documentation

## 2020-03-15 DIAGNOSIS — D649 Anemia, unspecified: Secondary | ICD-10-CM

## 2020-03-15 DIAGNOSIS — D631 Anemia in chronic kidney disease: Secondary | ICD-10-CM

## 2020-03-15 DIAGNOSIS — E790 Hyperuricemia without signs of inflammatory arthritis and tophaceous disease: Secondary | ICD-10-CM

## 2020-03-15 DIAGNOSIS — Z79899 Other long term (current) drug therapy: Secondary | ICD-10-CM | POA: Diagnosis not present

## 2020-03-15 DIAGNOSIS — Z7901 Long term (current) use of anticoagulants: Secondary | ICD-10-CM | POA: Insufficient documentation

## 2020-03-15 DIAGNOSIS — Z7982 Long term (current) use of aspirin: Secondary | ICD-10-CM | POA: Insufficient documentation

## 2020-03-15 DIAGNOSIS — I10 Essential (primary) hypertension: Secondary | ICD-10-CM | POA: Diagnosis not present

## 2020-03-15 DIAGNOSIS — Z86718 Personal history of other venous thrombosis and embolism: Secondary | ICD-10-CM | POA: Insufficient documentation

## 2020-03-15 DIAGNOSIS — I82432 Acute embolism and thrombosis of left popliteal vein: Secondary | ICD-10-CM

## 2020-03-15 LAB — CBC WITH DIFFERENTIAL/PLATELET
Abs Immature Granulocytes: 0.03 10*3/uL (ref 0.00–0.07)
Basophils Absolute: 0 10*3/uL (ref 0.0–0.1)
Basophils Relative: 1 %
Eosinophils Absolute: 0.3 10*3/uL (ref 0.0–0.5)
Eosinophils Relative: 3 %
HCT: 40.2 % (ref 39.0–52.0)
Hemoglobin: 12.6 g/dL — ABNORMAL LOW (ref 13.0–17.0)
Immature Granulocytes: 0 %
Lymphocytes Relative: 24 %
Lymphs Abs: 2 10*3/uL (ref 0.7–4.0)
MCH: 30.1 pg (ref 26.0–34.0)
MCHC: 31.3 g/dL (ref 30.0–36.0)
MCV: 96.2 fL (ref 80.0–100.0)
Monocytes Absolute: 0.7 10*3/uL (ref 0.1–1.0)
Monocytes Relative: 9 %
Neutro Abs: 5.4 10*3/uL (ref 1.7–7.7)
Neutrophils Relative %: 63 %
Platelets: 130 10*3/uL — ABNORMAL LOW (ref 150–400)
RBC: 4.18 MIL/uL — ABNORMAL LOW (ref 4.22–5.81)
RDW: 14.6 % (ref 11.5–15.5)
WBC: 8.4 10*3/uL (ref 4.0–10.5)
nRBC: 0 % (ref 0.0–0.2)

## 2020-03-15 LAB — COMPREHENSIVE METABOLIC PANEL
ALT: 30 U/L (ref 0–44)
AST: 18 U/L (ref 15–41)
Albumin: 3.9 g/dL (ref 3.5–5.0)
Alkaline Phosphatase: 42 U/L (ref 38–126)
Anion gap: 4 — ABNORMAL LOW (ref 5–15)
BUN: 53 mg/dL — ABNORMAL HIGH (ref 8–23)
CO2: 34 mmol/L — ABNORMAL HIGH (ref 22–32)
Calcium: 8.9 mg/dL (ref 8.9–10.3)
Chloride: 103 mmol/L (ref 98–111)
Creatinine, Ser: 1.98 mg/dL — ABNORMAL HIGH (ref 0.61–1.24)
GFR, Estimated: 32 mL/min — ABNORMAL LOW (ref >60)
Glucose, Bld: 95 mg/dL (ref 70–99)
Potassium: 4.6 mmol/L (ref 3.5–5.1)
Sodium: 141 mmol/L (ref 135–145)
Total Bilirubin: 0.6 mg/dL (ref 0.3–1.2)
Total Protein: 7.3 g/dL (ref 6.5–8.1)

## 2020-03-15 LAB — URIC ACID: Uric Acid, Serum: 8.6 mg/dL (ref 3.7–8.6)

## 2020-03-15 LAB — LACTATE DEHYDROGENASE: LDH: 145 U/L (ref 98–192)

## 2020-04-09 DIAGNOSIS — Z952 Presence of prosthetic heart valve: Secondary | ICD-10-CM | POA: Insufficient documentation

## 2020-04-09 HISTORY — PX: TRANSCATHETER AORTIC VALVE REPLACEMENT, TRANSAORTIC: SHX6402

## 2020-04-11 HISTORY — PX: PACEMAKER PLACEMENT: SHX43

## 2020-04-17 ENCOUNTER — Ambulatory Visit (INDEPENDENT_AMBULATORY_CARE_PROVIDER_SITE_OTHER): Payer: Medicare Other | Admitting: Physician Assistant

## 2020-04-17 ENCOUNTER — Ambulatory Visit: Payer: Medicare Other | Admitting: Physician Assistant

## 2020-04-17 ENCOUNTER — Other Ambulatory Visit: Payer: Self-pay

## 2020-04-17 DIAGNOSIS — N472 Paraphimosis: Secondary | ICD-10-CM | POA: Diagnosis not present

## 2020-04-17 DIAGNOSIS — N9989 Other postprocedural complications and disorders of genitourinary system: Secondary | ICD-10-CM | POA: Diagnosis not present

## 2020-04-17 DIAGNOSIS — R338 Other retention of urine: Secondary | ICD-10-CM | POA: Diagnosis not present

## 2020-04-17 LAB — BLADDER SCAN AMB NON-IMAGING

## 2020-04-17 NOTE — Progress Notes (Signed)
Catheter Removal  Patient is present today for a catheter removal.  23ml of water was drained from the balloon. A 18FR coude foley cath was removed from the bladder no complications were noted . Patient tolerated well.  Performed by: Debroah Loop, PA-C   Follow up/ Additional notes: Push fluids and RTC for PVR.

## 2020-04-17 NOTE — Progress Notes (Signed)
04/17/2020 11:21 AM   Hayden Martin October 15, 1931 [id number]  CC: Chief Complaint  Patient presents with  . Benign Prostatic Hypertrophy    HPI: Hayden Martin is a 87 y.o. male with PMH BPH with a history of urinary retention on silodosin and finasteride who presents today for outpatient voiding trial after developing postoperative urinary retention 6 days ago.  Patient underwent transcatheter aortic valve replacement on 08/31/6965 with Dr. Silvio Pate at [facility].  He subsequently was found to have a complete heart block requiring cardiac pacemaker placement on 04/11/2020.  He developed urinary retention following the second procedure.  Today he reports continuing silodosin and finasteride.  He has been tolerating his Foley catheter well.  No acute concerns today.  He denies dysuria or other voiding symptoms leading into his recent procedures.  PMH: Past Medical History:  Diagnosis Date  . Cancer ([place])   . Clotting disorder ([place])   . Diabetes mellitus without complication ([place])   . Heart disease   . Hypertension     Surgical History: Past Surgical History:  Procedure Laterality Date  . HERNIA REPAIR    . PERIPHERAL VASCULAR CATHETERIZATION N/A 01/18/2016   Procedure: IVC Filter Insertion;  Surgeon: Katha Cabal, MD;  Location: [place] [facility];  Service: Cardiovascular;  Laterality: N/A;  . PERIPHERAL VASCULAR CATHETERIZATION N/A 01/21/2016   Procedure: Glori Luis Cath Insertion;  Surgeon: Algernon Huxley, MD;  Location: [place] [facility];  Service: Cardiovascular;  Laterality: N/A;  . PORTA CATH REMOVAL N/A 01/07/2018   Procedure: PORTA CATH REMOVAL;  Surgeon: Algernon Huxley, MD;  Location: [place] [facility];  Service: Cardiovascular;  Laterality: N/A;    Home Medications:  Allergies as of 04/17/2020      Reactions   Codeine Itching   Penicillins Itching      Medication List       Accurate as of April 17, 2020 11:21 AM. If you have any questions, ask your nurse or  doctor.        allopurinol 100 MG tablet Commonly known as: Zyloprim Take 0.5 tablets (50 mg total) by mouth daily.   amLODipine 5 MG tablet Commonly known as: NORVASC Take 10 mg by mouth daily.   aspirin EC 81 MG tablet Take 81 mg by mouth daily.   atorvastatin 40 MG tablet Commonly known as: LIPITOR Take 20 mg by mouth daily.   carvedilol 6.25 MG tablet Commonly known as: COREG TAKE ONE (1) TABLET BY MOUTH TWO (2) TIMES DAILY   finasteride 5 MG tablet Commonly known as: PROSCAR Take 1 tablet (5 mg total) by mouth daily.   furosemide 20 MG tablet Commonly known as: LASIX Take 20 mg by mouth daily.   ketoconazole 2 % cream Commonly known as: NIZORAL APPLY AT BEDTIME DAILY TO AFFECTED AREAS   LANTUS [place] Inject into the skin. Inject 36 units subcutaneously at bedtime.   liraglutide 18 MG/3ML Sopn Commonly known as: VICTOZA Inject into the skin daily.   rivaroxaban 20 MG Tabs tablet Commonly known as: XARELTO Take 20 mg by mouth daily with supper.   silodosin 4 MG Caps capsule Commonly known as: RAPAFLO Take 1 capsule (4 mg total) by mouth daily with breakfast.   torsemide 20 MG tablet Commonly known as: DEMADEX Take 20 mg by mouth 2 (two) times daily.       Allergies:  Allergies  Allergen Reactions  . Codeine Itching  . Penicillins Itching    Family History: No family history  on file.  Social History:   reports that he quit smoking about 34 years ago. He smoked 1.50 packs per day. He has quit using smokeless tobacco. No history on file for alcohol use and drug use.  Physical Exam: There were no vitals taken for this visit.  Constitutional:  Alert and oriented, no acute distress, nontoxic appearing HEENT: Why, AT Cardiovascular: No clubbing, cyanosis, or edema Respiratory: Normal respiratory effort, no increased work of breathing GU: Uncircumcised penis.  Foreskin retracted and edematous.  Mild edema and erythema also noted of the glans  penis. Skin: No rashes, bruises or suspicious lesions Neurologic: Grossly intact, no focal deficits, moving all 4 extremities Psychiatric: Normal mood and affect  Laboratory Data: Results for orders placed or performed in visit on 04/17/20  BLADDER SCAN AMB NON-IMAGING  Result Value Ref Range   Scan Result 63mL    Assessment & Plan:   1. Postoperative urinary retention Foley catheter removed in the morning, see separate procedure note for details.  Patient returned to clinic in the afternoon.  He reports drinking 28 ounces of fluid.  He has been able to urinate.  PVR 4 mL.  Voiding trial passed.  Counseled patient to continue finasteride and silodosin and follow-up as needed.  He expressed understanding.  Counseled him to return to normal fluid intake - BLADDER SCAN AMB NON-IMAGING  2. Paraphimosis Noted incidentally on physical exam today.  I reduce the foreskin without difficulty and counseled the patient to keep the foreskin reduced and seek care if it becomes trapped behind the glans penis in the future.  He expressed understanding.  Return if symptoms worsen or fail to improve.  Debroah Loop, PA-C  [facility] [address] [place],  [address] [phone]

## 2020-08-27 ENCOUNTER — Other Ambulatory Visit: Payer: Self-pay

## 2020-08-27 ENCOUNTER — Inpatient Hospital Stay
Admission: EM | Admit: 2020-08-27 | Discharge: 2020-08-30 | DRG: 291 | Disposition: A | Discharge status: Home / Self Care | Payer: Medicare Other | Attending: Internal Medicine | Admitting: Internal Medicine

## 2020-08-27 ENCOUNTER — Emergency Department: Payer: Medicare Other

## 2020-08-27 ENCOUNTER — Encounter: Payer: Self-pay | Admitting: Emergency Medicine

## 2020-08-27 DIAGNOSIS — Z79899 Other long term (current) drug therapy: Secondary | ICD-10-CM

## 2020-08-27 DIAGNOSIS — E119 Type 2 diabetes mellitus without complications: Secondary | ICD-10-CM | POA: Diagnosis not present

## 2020-08-27 DIAGNOSIS — I2510 Atherosclerotic heart disease of native coronary artery without angina pectoris: Secondary | ICD-10-CM | POA: Diagnosis present

## 2020-08-27 DIAGNOSIS — Z66 Do not resuscitate: Secondary | ICD-10-CM | POA: Diagnosis present

## 2020-08-27 DIAGNOSIS — R0602 Shortness of breath: Secondary | ICD-10-CM | POA: Diagnosis present

## 2020-08-27 DIAGNOSIS — C8330 Diffuse large B-cell lymphoma, unspecified site: Secondary | ICD-10-CM | POA: Diagnosis present

## 2020-08-27 DIAGNOSIS — Z7982 Long term (current) use of aspirin: Secondary | ICD-10-CM | POA: Diagnosis not present

## 2020-08-27 DIAGNOSIS — J9601 Acute respiratory failure with hypoxia: Secondary | ICD-10-CM | POA: Diagnosis present

## 2020-08-27 DIAGNOSIS — I509 Heart failure, unspecified: Secondary | ICD-10-CM

## 2020-08-27 DIAGNOSIS — J918 Pleural effusion in other conditions classified elsewhere: Secondary | ICD-10-CM | POA: Diagnosis present

## 2020-08-27 DIAGNOSIS — E1122 Type 2 diabetes mellitus with diabetic chronic kidney disease: Secondary | ICD-10-CM | POA: Diagnosis present

## 2020-08-27 DIAGNOSIS — I10 Essential (primary) hypertension: Secondary | ICD-10-CM

## 2020-08-27 DIAGNOSIS — Z950 Presence of cardiac pacemaker: Secondary | ICD-10-CM

## 2020-08-27 DIAGNOSIS — Z952 Presence of prosthetic heart valve: Secondary | ICD-10-CM

## 2020-08-27 DIAGNOSIS — Z7901 Long term (current) use of anticoagulants: Secondary | ICD-10-CM | POA: Diagnosis not present

## 2020-08-27 DIAGNOSIS — R339 Retention of urine, unspecified: Secondary | ICD-10-CM | POA: Diagnosis present

## 2020-08-27 DIAGNOSIS — J90 Pleural effusion, not elsewhere classified: Secondary | ICD-10-CM

## 2020-08-27 DIAGNOSIS — Z7189 Other specified counseling: Secondary | ICD-10-CM

## 2020-08-27 DIAGNOSIS — I5033 Acute on chronic diastolic (congestive) heart failure: Secondary | ICD-10-CM | POA: Diagnosis present

## 2020-08-27 DIAGNOSIS — D649 Anemia, unspecified: Secondary | ICD-10-CM | POA: Diagnosis present

## 2020-08-27 DIAGNOSIS — I130 Hypertensive heart and chronic kidney disease with heart failure and stage 1 through stage 4 chronic kidney disease, or unspecified chronic kidney disease: Principal | ICD-10-CM | POA: Diagnosis present

## 2020-08-27 DIAGNOSIS — Z87891 Personal history of nicotine dependence: Secondary | ICD-10-CM

## 2020-08-27 DIAGNOSIS — Z86718 Personal history of other venous thrombosis and embolism: Secondary | ICD-10-CM

## 2020-08-27 DIAGNOSIS — R338 Other retention of urine: Secondary | ICD-10-CM

## 2020-08-27 DIAGNOSIS — N1832 Chronic kidney disease, stage 3b: Secondary | ICD-10-CM | POA: Diagnosis present

## 2020-08-27 DIAGNOSIS — Z20822 Contact with and (suspected) exposure to covid-19: Secondary | ICD-10-CM | POA: Diagnosis present

## 2020-08-27 DIAGNOSIS — Z95828 Presence of other vascular implants and grafts: Secondary | ICD-10-CM | POA: Diagnosis not present

## 2020-08-27 DIAGNOSIS — E790 Hyperuricemia without signs of inflammatory arthritis and tophaceous disease: Secondary | ICD-10-CM

## 2020-08-27 DIAGNOSIS — N183 Chronic kidney disease, stage 3 unspecified: Secondary | ICD-10-CM | POA: Diagnosis present

## 2020-08-27 DIAGNOSIS — Z794 Long term (current) use of insulin: Secondary | ICD-10-CM | POA: Diagnosis not present

## 2020-08-27 DIAGNOSIS — Z9889 Other specified postprocedural states: Secondary | ICD-10-CM | POA: Diagnosis not present

## 2020-08-27 HISTORY — DX: Heart failure, unspecified: I50.9

## 2020-08-27 LAB — RESP PANEL BY RT-PCR (FLU A&B, COVID) ARPGX2
Influenza A by PCR: NEGATIVE
Influenza B by PCR: NEGATIVE
SARS Coronavirus 2 by RT PCR: NEGATIVE

## 2020-08-27 LAB — RETICULOCYTES
Immature Retic Fract: 15 % (ref 2.3–15.9)
RBC.: 4.08 MIL/uL — ABNORMAL LOW (ref 4.22–5.81)
Retic Count, Absolute: 68.5 10*3/uL (ref 19.0–186.0)
Retic Ct Pct: 1.7 % (ref 0.4–3.1)

## 2020-08-27 LAB — BASIC METABOLIC PANEL
Anion gap: 6 (ref 5–15)
BUN: 37 mg/dL — ABNORMAL HIGH (ref 8–23)
CO2: 26 mmol/L (ref 22–32)
Calcium: 8.8 mg/dL — ABNORMAL LOW (ref 8.9–10.3)
Chloride: 108 mmol/L (ref 98–111)
Creatinine, Ser: 1.56 mg/dL — ABNORMAL HIGH (ref 0.61–1.24)
GFR, Estimated: 43 mL/min — ABNORMAL LOW (ref >60)
Glucose, Bld: 107 mg/dL — ABNORMAL HIGH (ref 70–99)
Potassium: 4.6 mmol/L (ref 3.5–5.1)
Sodium: 140 mmol/L (ref 135–145)

## 2020-08-27 LAB — TYPE AND SCREEN
ABO/RH(D): A POS
Antibody Screen: NEGATIVE

## 2020-08-27 LAB — CBC
HCT: 39.2 % (ref 39.0–52.0)
Hemoglobin: 12.8 g/dL — ABNORMAL LOW (ref 13.0–17.0)
MCH: 30.6 pg (ref 26.0–34.0)
MCHC: 32.7 g/dL (ref 30.0–36.0)
MCV: 93.8 fL (ref 80.0–100.0)
Platelets: 93 10*3/uL — ABNORMAL LOW (ref 150–400)
RBC: 4.18 MIL/uL — ABNORMAL LOW (ref 4.22–5.81)
RDW: 15 % (ref 11.5–15.5)
WBC: 7.4 10*3/uL (ref 4.0–10.5)
nRBC: 0 % (ref 0.0–0.2)

## 2020-08-27 LAB — PROTIME-INR
INR: 1.6 — ABNORMAL HIGH (ref 0.8–1.2)
Prothrombin Time: 19.2 seconds — ABNORMAL HIGH (ref 11.4–15.2)

## 2020-08-27 LAB — IRON AND TIBC
Iron: 46 ug/dL (ref 45–182)
Saturation Ratios: 16 % — ABNORMAL LOW (ref 17.9–39.5)
TIBC: 281 ug/dL (ref 250–450)
UIBC: 235 ug/dL

## 2020-08-27 LAB — TROPONIN I (HIGH SENSITIVITY): Troponin I (High Sensitivity): 13 ng/L (ref <18)

## 2020-08-27 LAB — APTT: aPTT: 36 seconds (ref 24–36)

## 2020-08-27 LAB — FOLATE: Folate: 32 ng/mL (ref >5.9)

## 2020-08-27 LAB — BRAIN NATRIURETIC PEPTIDE: B Natriuretic Peptide: 1094.6 pg/mL — ABNORMAL HIGH (ref 0.0–100.0)

## 2020-08-27 LAB — CBG MONITORING, ED: Glucose-Capillary: 100 mg/dL — ABNORMAL HIGH (ref 70–99)

## 2020-08-27 LAB — FERRITIN: Ferritin: 76 ng/mL (ref 24–336)

## 2020-08-27 MED ORDER — ONDANSETRON HCL 4 MG/2ML IJ SOLN: 4.0000 mg | Freq: Four times a day (QID) | INTRAMUSCULAR | Status: DC | PRN

## 2020-08-27 MED ORDER — SODIUM CHLORIDE 0.9% FLUSH: 3.0000 mL | INTRAVENOUS | Status: DC | PRN

## 2020-08-27 MED ORDER — ALLOPURINOL 100 MG PO TABS
50.0000 mg | ORAL_TABLET | Freq: Every day | ORAL | Status: DC
  Administered 2020-08-28 – 2020-08-30 (×3): 50 mg via ORAL
  Filled 2020-08-27 (×3): qty 0.5

## 2020-08-27 MED ORDER — APIXABAN 5 MG PO TABS
5.0000 mg | ORAL_TABLET | Freq: Two times a day (BID) | ORAL | Status: DC
  Administered 2020-08-27 – 2020-08-30 (×6): 5 mg via ORAL
  Filled 2020-08-27 (×6): qty 1

## 2020-08-27 MED ORDER — ALLOPURINOL 100 MG PO TABS: 50.0000 mg | ORAL_TABLET | Freq: Every day | ORAL | Status: DC

## 2020-08-27 MED ORDER — LIDOCAINE HCL URETHRAL/MUCOSAL 2 % EX GEL
1.0000 "application " | Freq: Once | CUTANEOUS | Status: AC
  Administered 2020-08-27: 1 via URETHRAL
  Filled 2020-08-27: qty 10

## 2020-08-27 MED ORDER — FINASTERIDE 5 MG PO TABS: 5.0000 mg | ORAL_TABLET | Freq: Every day | ORAL | Status: DC

## 2020-08-27 MED ORDER — INSULIN ASPART 100 UNIT/ML IJ SOLN
0.0000 [IU] | Freq: Three times a day (TID) | INTRAMUSCULAR | Status: DC
  Administered 2020-08-28: 1 [IU] via SUBCUTANEOUS
  Administered 2020-08-29: 3 [IU] via SUBCUTANEOUS
  Administered 2020-08-29: 2 [IU] via SUBCUTANEOUS
  Administered 2020-08-29 – 2020-08-30 (×2): 1 [IU] via SUBCUTANEOUS
  Administered 2020-08-30: 2 [IU] via SUBCUTANEOUS
  Filled 2020-08-27 (×6): qty 1

## 2020-08-27 MED ORDER — FUROSEMIDE 10 MG/ML IJ SOLN
60.0000 mg | Freq: Once | INTRAMUSCULAR | Status: AC
  Administered 2020-08-27: 60 mg via INTRAVENOUS
  Filled 2020-08-27: qty 8

## 2020-08-27 MED ORDER — TAMSULOSIN HCL 0.4 MG PO CAPS
0.4000 mg | ORAL_CAPSULE | Freq: Every day | ORAL | Status: DC
  Administered 2020-08-28 – 2020-08-30 (×3): 0.4 mg via ORAL
  Filled 2020-08-27 (×3): qty 1

## 2020-08-27 MED ORDER — ASPIRIN EC 81 MG PO TBEC: 81.0000 mg | DELAYED_RELEASE_TABLET | Freq: Every day | ORAL | Status: DC

## 2020-08-27 MED ORDER — CARVEDILOL 3.125 MG PO TABS
3.1250 mg | ORAL_TABLET | Freq: Two times a day (BID) | ORAL | Status: DC
  Administered 2020-08-28 – 2020-08-30 (×5): 3.125 mg via ORAL
  Filled 2020-08-27 (×5): qty 1

## 2020-08-27 MED ORDER — TAMSULOSIN HCL 0.4 MG PO CAPS: 0.4000 mg | ORAL_CAPSULE | Freq: Every day | ORAL | Status: DC

## 2020-08-27 MED ORDER — ATORVASTATIN CALCIUM 20 MG PO TABS
20.0000 mg | ORAL_TABLET | Freq: Every day | ORAL | Status: DC
  Administered 2020-08-28 – 2020-08-30 (×3): 20 mg via ORAL
  Filled 2020-08-27 (×3): qty 1

## 2020-08-27 MED ORDER — SODIUM CHLORIDE 0.9% FLUSH
3.0000 mL | Freq: Two times a day (BID) | INTRAVENOUS | Status: DC
  Administered 2020-08-27 – 2020-08-30 (×5): 3 mL via INTRAVENOUS

## 2020-08-27 MED ORDER — ACETAMINOPHEN 325 MG PO TABS
650.0000 mg | ORAL_TABLET | ORAL | Status: DC | PRN
  Administered 2020-08-29: 650 mg via ORAL
  Filled 2020-08-27: qty 2

## 2020-08-27 MED ORDER — SODIUM CHLORIDE 0.9 % IV SOLN: 250.0000 mL | INTRAVENOUS | Status: DC | PRN

## 2020-08-27 MED ORDER — FINASTERIDE 5 MG PO TABS
5.0000 mg | ORAL_TABLET | Freq: Every day | ORAL | Status: DC
  Administered 2020-08-28 – 2020-08-30 (×3): 5 mg via ORAL
  Filled 2020-08-27 (×3): qty 1

## 2020-08-27 MED ORDER — FUROSEMIDE 10 MG/ML IJ SOLN
20.0000 mg | Freq: Two times a day (BID) | INTRAMUSCULAR | Status: DC
  Administered 2020-08-28: 20 mg via INTRAVENOUS
  Filled 2020-08-27: qty 2

## 2020-08-27 MED ORDER — ORAL CARE MOUTH RINSE
15.0000 mL | Freq: Two times a day (BID) | OROMUCOSAL | Status: DC
  Administered 2020-08-27 – 2020-08-30 (×5): 15 mL via OROMUCOSAL

## 2020-08-27 MED ORDER — RIVAROXABAN 20 MG PO TABS: 20.0000 mg | ORAL_TABLET | Freq: Every day | ORAL | Status: DC

## 2020-08-27 NOTE — ED Notes (Signed)
Meal tray given 

## 2020-08-27 NOTE — ED Notes (Signed)
ED Provider at bedside. 

## 2020-08-27 NOTE — ED Provider Notes (Signed)
[facility] Emergency Department Provider Note ____________________________________________   Event Date/Time   First MD Initiated Contact with Patient 08/27/20 1453     (approximate)  I have reviewed the triage vital signs and the nursing notes.  HISTORY  Chief Complaint No chief complaint on file.   HPI KAISEI GILBO is a 87 y.o. malewho presents to the ED for evaluation of SOB.   Chart review indicates hx CHF, DM, HTN Medtronic dual-chamber pacemaker in place due to history of CHB.  Severe aortic stenosis s/p TAVR 4 months ago.  History DVT with IVC filter in place.  Diffuse large B-cell lymphoma.  CKD.  Anticoagulated on Eliquis. Patient lives at home alone and ambulates with a cane.  Patient presents to the ED today, accompanied by his granddaughter, for evaluation of progressively worsening shortness of breath, dyspnea on exertion and orthopnea.  He reports concurrent increased lower extremity edema.  He reports a 15 pound weight gain over the past couple months.  He reports compliance with his diuretics, continued urinary output, but worsening respiratory status despite this.  Denies fevers, chest pain, syncopal episodes, falls, abdominal pain.   Past Medical History:  Diagnosis Date   Cancer [facility])    CHF (congestive heart failure) (HCC)    Clotting disorder ([place])    Diabetes mellitus without complication ([place])    Heart disease    Hypertension     Patient Active Problem List   Diagnosis Date Noted   S/P TAVR (transcatheter aortic valve replacement) 04/09/2020   Anemia 03/14/2020   Acute on chronic congestive heart failure ([place]) 09/26/2019   Abnormal SPEP 05/04/2019   Bilateral edema of lower extremity 09/17/2017   Hyperuricemia 06/23/2017   Encounter for care related to vascular access port 06/23/2017   Foot drop, right 04/26/2016   Encounter for antineoplastic chemotherapy 03/08/2016   Acute deep vein thrombosis (DVT) of popliteal  vein of left lower extremity ([place]) 03/07/2016   Pleural effusion 02/05/2016   Chronic kidney disease, stage III (moderate) (HCC)    DNR (do not resuscitate) discussion    Palliative care by specialist    Diffuse large B cell lymphoma ([place]) 01/21/2016   Tumor lysis syndrome 01/15/2016   Splenic mass 01/14/2016   Adenopathy 01/14/2016   HTN (hypertension), benign 09/11/2013   Type 2 diabetes mellitus ([place]) 09/11/2013   CAD (coronary atherosclerotic disease) 09/11/2013    Past Surgical History:  Procedure Laterality Date   HERNIA REPAIR     PERIPHERAL VASCULAR CATHETERIZATION N/A 01/18/2016   Procedure: IVC Filter Insertion;  Surgeon: Katha Cabal, MD;  Location: [place] [facility];  Service: Cardiovascular;  Laterality: N/A;   PERIPHERAL VASCULAR CATHETERIZATION N/A 01/21/2016   Procedure: Glori Luis Cath Insertion;  Surgeon: Algernon Huxley, MD;  Location: [place] [facility];  Service: Cardiovascular;  Laterality: N/A;   PORTA CATH REMOVAL N/A 01/07/2018   Procedure: PORTA CATH REMOVAL;  Surgeon: Algernon Huxley, MD;  Location: [place] [facility];  Service: Cardiovascular;  Laterality: N/A;    Prior to Admission medications   Medication Sig Start Date End Date Taking? Authorizing Provider  allopurinol (ZYLOPRIM) 100 MG tablet Take 0.5 tablets (50 mg total) by mouth daily. 09/07/19   Lequita Asal, MD  amLODipine (NORVASC) 5 MG tablet Take 10 mg by mouth daily.     [provider]  aspirin EC 81 MG tablet Take 81 mg by mouth daily.    [provider]  atorvastatin (LIPITOR) 40  MG tablet Take 20 mg by mouth daily.    [provider]  carvedilol (COREG) 6.25 MG tablet TAKE ONE (1) TABLET BY MOUTH TWO (2) TIMES DAILY 02/05/15   [provider]  finasteride (PROSCAR) 5 MG tablet Take 1 tablet (5 mg total) by mouth daily. 03/05/20   Vaillancourt, Aldona Bar, PA-C  furosemide (LASIX) 20 MG tablet Take 20 mg by mouth daily.     [provider]   Insulin Glargine (LANTUS [place]) Inject into the skin. Inject 36 units subcutaneously at bedtime.    [provider]  ketoconazole (NIZORAL) 2 % cream APPLY AT BEDTIME DAILY TO AFFECTED AREAS 12/27/14   [provider]  liraglutide (VICTOZA) 18 MG/3ML SOPN Inject into the skin daily.    [provider]  rivaroxaban (XARELTO) 20 MG TABS tablet Take 20 mg by mouth daily with supper.    [provider]  silodosin (RAPAFLO) 4 MG CAPS capsule Take 1 capsule (4 mg total) by mouth daily with breakfast. 03/05/20   Vaillancourt, Aldona Bar, PA-C  torsemide (DEMADEX) 20 MG tablet Take 20 mg by mouth 2 (two) times daily. 01/14/20   [provider]    Allergies Codeine and Penicillins  No family history on file.  Social History Social History   Tobacco Use   Smoking status: Former    Packs/day: 1.50    Pack years: 0.00    Types: Cigarettes    Quit date: 01/01/1986    Years since quitting: 34.6   Smokeless tobacco: Former   Tobacco comments:    Smoked for 20 years    Review of Systems  Constitutional: No fever/chills Eyes: No visual changes. ENT: No sore throat. Cardiovascular: Denies chest pain. Respiratory: Positive for orthopnea and shortness of breath. Gastrointestinal: No abdominal pain.  No nausea, no vomiting.  No diarrhea.  No constipation. Genitourinary: Negative for dysuria. Musculoskeletal: Negative for back pain.  Positive for increased lower extremity swelling, atraumatic. Skin: Negative for rash. Neurological: Negative for headaches, focal weakness or numbness.  ____________________________________________   PHYSICAL EXAM:  VITAL SIGNS: Vitals:   08/27/20 1545 08/27/20 1600  BP: (!) 141/57 (!) 148/58  Pulse: 63 (!) 59  Resp: (!) 21 (!) 26  Temp:    SpO2: 93% (!) 89%    Constitutional: Alert and oriented. Well appearing and in no acute distress. Eyes: Conjunctivae are normal. PERRL. EOMI. Head: Atraumatic. Nose: No  congestion/rhinnorhea. Mouth/Throat: Mucous membranes are moist.  Oropharynx non-erythematous. Neck: No stridor. No cervical spine tenderness to palpation. Cardiovascular: Normal rate, regular rhythm. Grossly normal heart sounds.  Good peripheral circulation. Respiratory: Mild tachypnea to the low 20s.  No further evidence of distress.  No wheezing.  Decreased breath sounds to the left base. Gastrointestinal: Soft , nondistended, nontender to palpation. No CVA tenderness. Musculoskeletal: No lower extremity tenderness  No joint effusions. No signs of acute trauma. Pitting edema to bilateral lower extremities symmetrically up to the knees.  No overlying skin changes or signs of trauma. Neurologic:  Normal speech and language. No gross focal neurologic deficits are appreciated. No gait instability noted. Skin:  Skin is warm, dry and intact. No rash noted. Psychiatric: Mood and affect are normal. Speech and behavior are normal. ____________________________________________   LABS (all labs ordered are listed, but only abnormal results are displayed)  Labs Reviewed  BASIC METABOLIC PANEL - Abnormal; Notable for the following components:      Result Value   Glucose, Bld 107 (*)    BUN 37 (*)  Creatinine, Ser 1.56 (*)    Calcium 8.8 (*)    GFR, Estimated 43 (*)    All other components within normal limits  CBC - Abnormal; Notable for the following components:   RBC 4.18 (*)    Hemoglobin 12.8 (*)    Platelets 93 (*)    All other components within normal limits  PROTIME-INR - Abnormal; Notable for the following components:   Prothrombin Time 19.2 (*)    INR 1.6 (*)    All other components within normal limits  BRAIN NATRIURETIC PEPTIDE - Abnormal; Notable for the following components:   B Natriuretic Peptide 1,094.6 (*)    All other components within normal limits  RESP PANEL BY RT-PCR (FLU A&B, COVID) ARPGX2  APTT  TROPONIN I (HIGH SENSITIVITY)    ____________________________________________  12 Lead EKG  Ventricularly paced rhythm, rate of 73 bpm.  Leftward axis.  LBBB morphology.  No ischemia.  4 PVCs ____________________________________________  RADIOLOGY  ED MD interpretation: 2 view CXR with pulmonary vascular congestion and small left pleural effusion.  Official radiology report(s): DG Chest 2 View  Result Date: 08/27/2020 CLINICAL DATA:  Shortness of breath. EXAM: CHEST - 2 VIEW COMPARISON:  March 07, 2016. FINDINGS: Stable cardiomediastinal silhouette. Status post transcatheter aortic valve repair. Left-sided pacemaker is noted with leads in grossly good position. No pneumothorax or pleural effusion is noted. Mild central pulmonary vascular congestion is noted. Small left pleural effusion is noted with adjacent left basilar atelectasis or infiltrate. Bony thorax is unremarkable. IMPRESSION: Central pulmonary vascular congestion is noted. Small left pleural effusion with adjacent left basilar atelectasis or infiltrate. Aortic Atherosclerosis (ICD10-I70.0). Electronically Signed   By: Marijo Conception M.D.   On: 08/27/2020 15:26    ____________________________________________   PROCEDURES and INTERVENTIONS  Procedure(s) performed (including Critical Care):  .1-3 Lead EKG Interpretation  Date/Time: 08/27/2020 3:42 PM Performed by: Vladimir Crofts, MD Authorized by: Vladimir Crofts, MD     Interpretation: normal     ECG rate:  80   ECG rate assessment: normal     Rhythm: paced     Ectopy: none     Conduction: normal    Medications  furosemide (LASIX) injection 60 mg (60 mg Intravenous Given 08/27/20 1536)  lidocaine (XYLOCAINE) 2 % jelly 1 application (1 application Urethral Given 08/27/20 1722)    ____________________________________________   MDM / ED COURSE   87 year old male with recent TAVR and pacemaker for CHB presents to the ED with evidence of volume overload and CHF requiring medical admission.  He  certainly looks volume overloaded with peripheral edema, faint bibasilar crackles and decreased breath sounds on the left.  CXR with a pleural effusion on the left as the likely source of decreased breath sounds.  Patient without infectious symptoms or stigmata of sepsis on his blood work.  No evidence of CAP or ACS.  Blood work with CKD around baseline.  Elevated BNP further suggestive of CHF.  We will initiate diuresis here in the ED with IV Lasix.  Due to his urinary retention and need for continued diuresis over the next few days, we will place indwelling Foley catheter.  We will discuss the case with medicine for admission.  Clinical Course as of 08/27/20 1724  Mon Aug 27, 2020  1618 Call from [place]. Been in [facility] for quite awhile, a few months. Some rapid rates, rvr to 110s. No signs of significant issues [DS]  1705 Reassessed.  Patient trying to void but having some difficulty.  Ross, the nurse, is with the patient working with him.  We discussed bladder scanning and possible need for Foley catheter due to his need for diuresis, if he is unable to effectively void. [DS]  2952 Pt unable to void , bladder scan was 650 mL.  I discussed the patient due to the need for diuresis and his inability to void, indwelling Foley catheter might be reasonable, he is in agreement. [DS]    Clinical Course User Index [DS] Vladimir Crofts, MD    ____________________________________________   FINAL CLINICAL IMPRESSION(S) / ED DIAGNOSES  Final diagnoses:  Acute on chronic congestive heart failure, unspecified heart failure type ([place])  Shortness of breath  Acute urinary retention     ED Discharge Orders     None        Rosie Torrez   Note:  This document was prepared using Dragon voice recognition software and may include unintentional dictation errors.    Vladimir Crofts, MD 08/27/20 1726

## 2020-08-27 NOTE — ED Notes (Signed)
Bladder Scan 649

## 2020-08-27 NOTE — ED Triage Notes (Signed)
C/O SOB x 1 month.  States had a heart valve replaced and pacemaker placed in February.  Also c/o retaining fluid x 5 years.  AAOx3.  DOE noted.

## 2020-08-27 NOTE — ED Notes (Signed)
Patient transported to CT 

## 2020-08-27 NOTE — H&P (Signed)
History and Physical    Hayden Martin HQP:[id number] DOB: 1932-02-05 DOA: 08/27/2020  PCP: Kirk Ruths, MD    Patient coming from:  Home   Chief Complaint:  Shortness of breath   HPI: Hayden Martin is a 87 y.o. male seen in ed with complaints of shortness of breath, brought by granddaughter.  Patient also reports shortness of breath with exertion and shortness of breath on laying flat, along with lower extremity edema.  Patient notes a 15 pound weight gain over the past couple of months, compliant with his medications and diuretic therapy, no chest pain palpitations headache blurred vision slurred speech gait issues abdominal bladder or bowel complaints.  Patient has had a TAVR 4 months ago and had a DVT and now has IVC filter and is anticoagulated on Eliquis.   Pt has past medical history of diffuse large B-cell lymphoma, chronic kidney disease, congestive heart failure, diabetes mellitus type 2, heart disease   ED Course:  Vitals:   08/27/20 1509 08/27/20 1511 08/27/20 1545 08/27/20 1600  BP: (!) 148/71  (!) 141/57 (!) 148/58  Pulse: (!) 41 84 63 (!) 59  Resp:  (!) 22 (!) 21 (!) 26  Temp:      TempSrc:      SpO2: 95% 92% 93% (!) 89%  Weight:      Height:        In the emergency room CBC shows a normal white count of 7.4, hemoglobin at 12 8, platelet count of 93 which is new.  CMP shows sodium of 140, glucose of 187, creatinine of 1.56 with a GFR 43, this is an improvement from his previous creatinine of 1.98.  Elevated BNP of 1094.6, troponin of 13.  Patient received Lasix 60 mg IV x1 in the emergency room.  Review of Systems:  Review of Systems  Respiratory:  Positive for shortness of breath.   Cardiovascular:  Positive for orthopnea and leg swelling.  All other systems reviewed and are negative.   Past Medical History:  Diagnosis Date   Cancer [facility])    CHF (congestive heart failure) (HCC)    Clotting disorder ([place])    Diabetes mellitus without  complication ([place])    Heart disease    Hypertension     Past Surgical History:  Procedure Laterality Date   HERNIA REPAIR     PERIPHERAL VASCULAR CATHETERIZATION N/A 01/18/2016   Procedure: IVC Filter Insertion;  Surgeon: Katha Cabal, MD;  Location: [place] [facility];  Service: Cardiovascular;  Laterality: N/A;   PERIPHERAL VASCULAR CATHETERIZATION N/A 01/21/2016   Procedure: Glori Luis Cath Insertion;  Surgeon: Algernon Huxley, MD;  Location: [place] [facility];  Service: Cardiovascular;  Laterality: N/A;   PORTA CATH REMOVAL N/A 01/07/2018   Procedure: PORTA CATH REMOVAL;  Surgeon: Algernon Huxley, MD;  Location: [place] [facility];  Service: Cardiovascular;  Laterality: N/A;     reports that he quit smoking about 34 years ago. He smoked an average of 1.50 packs per day. He has quit using smokeless tobacco. No history on file for alcohol use and drug use.  Allergies  Allergen Reactions   Codeine Itching   Penicillins Itching    No family history on file.  Prior to Admission medications   Medication Sig Start Date End Date Taking? Authorizing Provider  allopurinol (ZYLOPRIM) 100 MG tablet Take 0.5 tablets (50 mg total) by mouth daily. 09/07/19   Lequita Asal, MD  amLODipine (NORVASC) 5 MG tablet  Take 10 mg by mouth daily.     [provider]  aspirin EC 81 MG tablet Take 81 mg by mouth daily.    [provider]  atorvastatin (LIPITOR) 40 MG tablet Take 20 mg by mouth daily.    [provider]  carvedilol (COREG) 6.25 MG tablet TAKE ONE (1) TABLET BY MOUTH TWO (2) TIMES DAILY 02/05/15   [provider]  finasteride (PROSCAR) 5 MG tablet Take 1 tablet (5 mg total) by mouth daily. 03/05/20   Vaillancourt, Aldona Bar, PA-C  furosemide (LASIX) 20 MG tablet Take 20 mg by mouth daily.     [provider]  Insulin Glargine (LANTUS [place]) Inject into the skin. Inject 36 units subcutaneously at bedtime.    [provider]  ketoconazole  (NIZORAL) 2 % cream APPLY AT BEDTIME DAILY TO AFFECTED AREAS 12/27/14   [provider]  liraglutide (VICTOZA) 18 MG/3ML SOPN Inject into the skin daily.    [provider]  rivaroxaban (XARELTO) 20 MG TABS tablet Take 20 mg by mouth daily with supper.    [provider]  silodosin (RAPAFLO) 4 MG CAPS capsule Take 1 capsule (4 mg total) by mouth daily with breakfast. 03/05/20   Vaillancourt, Aldona Bar, PA-C  torsemide (DEMADEX) 20 MG tablet Take 20 mg by mouth 2 (two) times daily. 01/14/20   [provider]    Physical Exam: Vitals:   08/27/20 1509 08/27/20 1511 08/27/20 1545 08/27/20 1600  BP: (!) 148/71  (!) 141/57 (!) 148/58  Pulse: (!) 41 84 63 (!) 59  Resp:  (!) 22 (!) 21 (!) 26  Temp:      TempSrc:      SpO2: 95% 92% 93% (!) 89%  Weight:      Height:       Physical Exam Vitals and nursing note reviewed.  Constitutional:      General: He is not in acute distress.    Appearance: Normal appearance. He is not ill-appearing.  HENT:     Head: Normocephalic and atraumatic.     Right Ear: External ear normal.     Left Ear: External ear normal.     Nose: Nose normal.  Eyes:     Extraocular Movements: Extraocular movements intact.     Pupils: Pupils are equal, round, and reactive to light.  Neck:     Vascular: No carotid bruit.  Cardiovascular:     Rate and Rhythm: Normal rate and regular rhythm.     Pulses: Normal pulses.     Heart sounds: Normal heart sounds.  Pulmonary:     Effort: Pulmonary effort is normal.     Breath sounds: Normal breath sounds.  Abdominal:     General: Bowel sounds are normal. There is no distension.     Palpations: Abdomen is soft. There is no mass.     Tenderness: There is no abdominal tenderness. There is no guarding.     Hernia: No hernia is present.  Musculoskeletal:     Right lower leg: Edema present.     Left lower leg: Edema present.  Skin:    General: Skin is warm.  Neurological:     General: No focal  deficit present.     Mental Status: He is alert and oriented to person, place, and time.  Psychiatric:        Mood and Affect: Mood normal.        Behavior: Behavior normal.     Labs on Admission: I have  personally reviewed following labs and imaging studies  No results for input(s): CKTOTAL, CKMB, TROPONINI in the last 72 hours. Lab Results  Component Value Date   WBC 7.4 08/27/2020   HGB 12.8 (L) 08/27/2020   HCT 39.2 08/27/2020   MCV 93.8 08/27/2020   PLT 93 (L) 08/27/2020    Recent Labs  Lab 08/27/20 1415  NA 140  K 4.6  CL 108  CO2 26  BUN 37*  CREATININE 1.56*  CALCIUM 8.8*  GLUCOSE 107*   No results found for: CHOL, HDL, LDLCALC, TRIG No results found for: DDIMER Invalid input(s): POCBNP  Urinalysis    Component Value Date/Time   COLORURINE STRAW (A) 01/20/2020 1448   APPEARANCEUR CLEAR (A) 01/20/2020 1448   LABSPEC 1.006 01/20/2020 1448   PHURINE 5.0 01/20/2020 1448   GLUCOSEU 50 (A) 01/20/2020 1448   HGBUR SMALL (A) 01/20/2020 1448   BILIRUBINUR NEGATIVE 01/20/2020 1448   KETONESUR NEGATIVE 01/20/2020 1448   PROTEINUR NEGATIVE 01/20/2020 1448   NITRITE NEGATIVE 01/20/2020 1448   LEUKOCYTESUR NEGATIVE 01/20/2020 1448   COVID-19 Labs No results for input(s): DDIMER, FERRITIN, LDH, CRP in the last 72 hours. Lab Results  Component Value Date   [place] NEGATIVE 08/27/2020  Radiological Exams on Admission: DG Chest 2 View  Result Date: 08/27/2020 CLINICAL DATA:  Shortness of breath. EXAM: CHEST - 2 VIEW COMPARISON:  March 07, 2016. FINDINGS: Stable cardiomediastinal silhouette. Status post transcatheter aortic valve repair. Left-sided pacemaker is noted with leads in grossly good position. No pneumothorax or pleural effusion is noted. Mild central pulmonary vascular congestion is noted. Small left pleural effusion is noted with adjacent left basilar atelectasis or infiltrate. Bony thorax is unremarkable. IMPRESSION: Central pulmonary vascular  congestion is noted. Small left pleural effusion with adjacent left basilar atelectasis or infiltrate. Aortic Atherosclerosis (ICD10-I70.0). Electronically Signed   By: Marijo Conception M.D.   On: 08/27/2020 15:26    EKG: Independently reviewed.  Ventricular paced at 73, lbbb, pvc's  no st t wave changes.    Assessment/Plan Principal Problem:   Acute on chronic congestive heart failure (HCC) Active Problems:   Chronic kidney disease, stage III (moderate) (HCC)   DNR (do not resuscitate) discussion   HTN (hypertension), benign   Anemia   Type 2 diabetes mellitus (HCC)   CAD (coronary atherosclerotic disease) Acute on chronic congestive heart failure: Will admit patient to cardiac progressive unit and for acute exacerbation of congestive heart failure. We will cont with Lasix 20 mg twice daily. We will monitor daily weight and strict Intake / output.  Obtain 2 d echo.  CKD stage III: Creatinine is improved. We will diurese cautiously and monitor.  Avoid contrast studies and renally dose meds.  HTN: Blood pressure (!) 148/58, pulse (!) 59, temperature 98.7 F (37.1 C), temperature source Oral, resp. rate (!) 26, height 5\' 11"  (1.803 m), weight 99.8 kg, SpO2 (!) 89 %.  Anemia: Mild with hb of 12.8. Pt is on xarelto and we will monitor h/h.  DM II: Hold home regimen of insulin. Glycemic protocol.  CAD: Cont asa/ statin therapy.  Consider cardiology consult in am after echo resulted.    DVT prophylaxis:  SCD'd   Code Status:  Full code   Family Communication:  Hayden Martin, Hayden Martin (Son)  [phone] (Mobile)   Disposition Plan:  TBD   Consults called:  None   Admission status: Inpatient     Para Skeans MD [facility] [phone] How to contact the [facility] Attending or  Consulting provider [place] or covering provider during after hours [place], for this patient.    Check the care team in [facility] and look for a) attending/consulting [facility] provider listed and b)  the [facility] team listed Log into [email or web address] and use [place]'s universal password to access. If you do not have the password, please contact the hospital operator. Locate the [facility] provider you are looking for under [facility] and page to a number that you can be directly reached. If you still have difficulty reaching the provider, please page the [facility] (Director on Call) for the Hospitalists listed on amion for assistance. [email or web address] Password [facility] 08/27/2020, 5:23 PM

## 2020-08-27 NOTE — ED Notes (Signed)
Request made for transport  

## 2020-08-28 ENCOUNTER — Encounter: Payer: Self-pay | Admitting: Internal Medicine

## 2020-08-28 ENCOUNTER — Inpatient Hospital Stay
Admit: 2020-08-28 | Discharge: 2020-08-28 | Disposition: A | Discharge status: Home / Self Care | Payer: Medicare Other | Attending: Internal Medicine | Admitting: Internal Medicine

## 2020-08-28 DIAGNOSIS — N1832 Chronic kidney disease, stage 3b: Secondary | ICD-10-CM

## 2020-08-28 DIAGNOSIS — I5033 Acute on chronic diastolic (congestive) heart failure: Secondary | ICD-10-CM

## 2020-08-28 LAB — BASIC METABOLIC PANEL
Anion gap: 4 — ABNORMAL LOW (ref 5–15)
BUN: 38 mg/dL — ABNORMAL HIGH (ref 8–23)
CO2: 29 mmol/L (ref 22–32)
Calcium: 8.6 mg/dL — ABNORMAL LOW (ref 8.9–10.3)
Chloride: 105 mmol/L (ref 98–111)
Creatinine, Ser: 1.81 mg/dL — ABNORMAL HIGH (ref 0.61–1.24)
GFR, Estimated: 36 mL/min — ABNORMAL LOW (ref >60)
Glucose, Bld: 211 mg/dL — ABNORMAL HIGH (ref 70–99)
Potassium: 4.4 mmol/L (ref 3.5–5.1)
Sodium: 138 mmol/L (ref 135–145)

## 2020-08-28 LAB — ECHOCARDIOGRAM COMPLETE
AR max vel: 0.74 cm2
AV Area VTI: 0.75 cm2
AV Area mean vel: 0.74 cm2
AV Mean grad: 8 mmHg
AV Peak grad: 13.9 mmHg
Ao pk vel: 1.87 m/s
Area-P 1/2: 4.96 cm2
Height: 71 in
MV VTI: 0.79 cm2
S' Lateral: 3.72 cm
Weight: 3366.87 oz

## 2020-08-28 LAB — GLUCOSE, CAPILLARY
Glucose-Capillary: 118 mg/dL — ABNORMAL HIGH (ref 70–99)
Glucose-Capillary: 138 mg/dL — ABNORMAL HIGH (ref 70–99)
Glucose-Capillary: 150 mg/dL — ABNORMAL HIGH (ref 70–99)
Glucose-Capillary: 193 mg/dL — ABNORMAL HIGH (ref 70–99)

## 2020-08-28 LAB — VITAMIN B12: Vitamin B-12: 494 pg/mL (ref 180–914)

## 2020-08-28 LAB — MAGNESIUM: Magnesium: 1.7 mg/dL (ref 1.7–2.4)

## 2020-08-28 MED ORDER — FUROSEMIDE 10 MG/ML IJ SOLN
60.0000 mg | Freq: Two times a day (BID) | INTRAMUSCULAR | Status: DC
  Administered 2020-08-28 – 2020-08-30 (×5): 60 mg via INTRAVENOUS
  Filled 2020-08-28 (×5): qty 6

## 2020-08-28 MED ORDER — CHLORHEXIDINE GLUCONATE CLOTH 2 % EX PADS
6.0000 | MEDICATED_PAD | Freq: Every day | CUTANEOUS | Status: DC
  Administered 2020-08-28: 6 via TOPICAL

## 2020-08-28 NOTE — Progress Notes (Signed)
Progress Note    Hayden Martin  SWN:[id number] DOB: September 14, 1931  DOA: 08/27/2020 PCP: Kirk Ruths, MD      Brief Narrative:    Medical records reviewed and are as summarized below:  Hayden Martin is a 87 y.o. male with past medical history significant for chronic diastolic CHF,TAVR for aortic stenosis in February 2022, s/p permanent pacemaker, CAD, diffuse large B-cell lymphoma, CKD stage IIIb, history of DVT in the left lower extremity on Eliquis s/p IVC filter in November 2017, presented to the hospital because of increasing shortness of breath and lower extremity edema.  He was admitted to the hospital for acute exacerbation of chronic diastolic CHF.  He was treated with IV Lasix.    Assessment/Plan:   Principal Problem:   Acute on chronic congestive heart failure (HCC) Active Problems:   Chronic kidney disease, stage III (moderate) (HCC)   DNR (do not resuscitate) discussion   HTN (hypertension), benign   Anemia   Type 2 diabetes mellitus ([place])   CAD (coronary atherosclerotic disease)   Acute on chronic heart failure (HCC)    Body mass index is 29.35 kg/m.   Acute on chronic diastolic CHF with peripheral edema: Continue IV Lasix.  Monitor BMP, daily weight and urine output.  2D echo is pending.  Acute hypoxic respiratory failure: Continue oxygen via nasal cannula and taper off oxygen as able.  CAD, s/p TAVR in February 2022: He is on Eliquis  Hypertension: Continue antihypertensives  History of lower extremity DVT s/p IVC filter in 2017: Continue Eliquis  CKD stage IIIb: Monitor creatinine closely  Acute urinary retention: Bladder scan in the ED showed 649 mls of urine.  Continue Foley catheter for now while on IV diuresis.  Permanent pacemaker in place   Diet Order             Diet heart healthy/carb modified Room service appropriate? Yes; Fluid consistency: Thin  Diet effective now                       Consultants: None  Procedures: None    Medications:    allopurinol  50 mg Oral Daily   apixaban  5 mg Oral BID   atorvastatin  20 mg Oral Daily   carvedilol  3.125 mg Oral BID WC   Chlorhexidine Gluconate Cloth  6 each Topical Daily   finasteride  5 mg Oral Daily   furosemide  20 mg Intravenous Q12H   insulin aspart  0-9 Units Subcutaneous TID WC   mouth rinse  15 mL Mouth Rinse BID   sodium chloride flush  3 mL Intravenous Q12H   tamsulosin  0.4 mg Oral QPC breakfast   Continuous Infusions:  sodium chloride       Anti-infectives (From admission, onward)    None              Family Communication/Anticipated D/C date and plan/Code Status   DVT prophylaxis: SCDs Start: 08/27/20 1744 apixaban (ELIQUIS) tablet 5 mg     Code Status: Full Code  Family Communication: His son and daughter-in-law at the bedside Disposition Plan:    Status is: Inpatient  Remains inpatient appropriate because:IV treatments appropriate due to intensity of illness or inability to take PO  Dispo: The patient is from: Home              Anticipated d/c is to: Home  Patient currently is not medically stable to d/c.   Difficult to place patient No           Subjective:   C/o shortness of breath and leg swelling.  Objective:    Vitals:   08/27/20 1957 08/27/20 2039 08/28/20 0500 08/28/20 0748  BP: (!) 140/92 135/61  (!) 128/53  Pulse: 77 (!) 57  60  Resp: 17 16  19   Temp: 97.9 F (36.6 C) 98 F (36.7 C)  98.3 F (36.8 C)  TempSrc: Oral     SpO2: 97% 98%  94%  Weight:  97.8 kg 95.5 kg   Height:  5\' 11"  (1.803 m)     No data found.   Intake/Output Summary (Last 24 hours) at 08/28/2020 0851 Last data filed at 08/28/2020 0300 Gross per 24 hour  Intake --  Output 1700 ml  Net -1700 ml   Filed Weights   08/27/20 1412 08/27/20 2039 08/28/20 0500  Weight: 99.8 kg 97.8 kg 95.5 kg    Exam:  GEN: NAD SKIN: Warm and dry EYES:  EOMI ENT: MMM CV: RRR PULM: CTA B ABD: soft, distended, NT, +BS CNS: AAO x 3, non focal EXT: B/l leg edema 3+ (L>R), no tenderness or erythema GU: Foley catheter draining amber urine       Data Reviewed:   I have personally reviewed following labs and imaging studies:  Labs: Labs show the following:   Basic Metabolic Panel: Recent Labs  Lab 08/27/20 1415  NA 140  K 4.6  CL 108  CO2 26  GLUCOSE 107*  BUN 37*  CREATININE 1.56*  CALCIUM 8.8*   GFR Estimated Creatinine Clearance: 39.4 mL/min (A) (by C-G formula based on SCr of 1.56 mg/dL (H)). Liver Function Tests: No results for input(s): AST, ALT, ALKPHOS, BILITOT, PROT, ALBUMIN in the last 168 hours. No results for input(s): LIPASE, AMYLASE in the last 168 hours. No results for input(s): AMMONIA in the last 168 hours. Coagulation profile Recent Labs  Lab 08/27/20 1415  INR 1.6*    CBC: Recent Labs  Lab 08/27/20 1415  WBC 7.4  HGB 12.8*  HCT 39.2  MCV 93.8  PLT 93*   Cardiac Enzymes: No results for input(s): CKTOTAL, CKMB, CKMBINDEX, TROPONINI in the last 168 hours. BNP (last 3 results) No results for input(s): PROBNP in the last 8760 hours. CBG: Recent Labs  Lab 08/27/20 1854 08/28/20 0824  GLUCAP 100* 118*   D-Dimer: No results for input(s): DDIMER in the last 72 hours. Hgb A1c: No results for input(s): HGBA1C in the last 72 hours. Lipid Profile: No results for input(s): CHOL, HDL, LDLCALC, TRIG, CHOLHDL, LDLDIRECT in the last 72 hours. Thyroid function studies: No results for input(s): TSH, T4TOTAL, T3FREE, THYROIDAB in the last 72 hours.  Invalid input(s): FREET3 Anemia work up: Recent Labs    08/27/20 2129  VITAMINB12 494  FOLATE 32.0  FERRITIN 76  TIBC 281  IRON 46  RETICCTPCT 1.7   Sepsis Labs: Recent Labs  Lab 08/27/20 1415  WBC 7.4    Microbiology Recent Results (from the past 240 hour(s))  Resp Panel by RT-PCR (Flu A&B, Covid) Nasopharyngeal Swab     Status: None    Collection Time: 08/27/20  3:31 PM   Specimen: Nasopharyngeal Swab; Nasopharyngeal(NP) swabs in vial transport medium  Result Value Ref Range Status   SARS Coronavirus 2 by RT PCR NEGATIVE NEGATIVE Final    Comment: (NOTE) SARS-CoV-2 target nucleic acids are NOT DETECTED.  The SARS-CoV-2  RNA is generally detectable in upper respiratory specimens during the acute phase of infection. The lowest concentration of SARS-CoV-2 viral copies this assay can detect is 138 copies/mL. A negative result does not preclude SARS-Cov-2 infection and should not be used as the sole basis for treatment or other patient management decisions. A negative result may occur with  improper specimen collection/handling, submission of specimen other than nasopharyngeal swab, presence of viral mutation(s) within the areas targeted by this assay, and inadequate number of viral copies(<138 copies/mL). A negative result must be combined with clinical observations, patient history, and epidemiological information. The expected result is Negative.  Fact Sheet for Patients:  EntrepreneurPulse.com.au  Fact Sheet for Healthcare Providers:  IncredibleEmployment.be  This test is no t yet approved or cleared by the [place] FDA and  has been authorized for detection and/or diagnosis of SARS-CoV-2 by FDA under an Emergency Use Authorization (EUA). This EUA will remain  in effect (meaning this test can be used) for the duration of the COVID-19 declaration under Section 564(b)(1) of the Act, 21 U.S.C.section 360bbb-3(b)(1), unless the authorization is terminated  or revoked sooner.       Influenza A by PCR NEGATIVE NEGATIVE Final   Influenza B by PCR NEGATIVE NEGATIVE Final    Comment: (NOTE) The Xpert Xpress SARS-CoV-2/FLU/RSV plus assay is intended as an aid in the diagnosis of influenza from Nasopharyngeal swab specimens and should not be used as a sole basis for treatment.  Nasal washings and aspirates are unacceptable for Xpert Xpress SARS-CoV-2/FLU/RSV testing.  Fact Sheet for Patients: EntrepreneurPulse.com.au  Fact Sheet for Healthcare Providers: IncredibleEmployment.be  This test is not yet approved or cleared by the [place] FDA and has been authorized for detection and/or diagnosis of SARS-CoV-2 by FDA under an Emergency Use Authorization (EUA). This EUA will remain in effect (meaning this test can be used) for the duration of the COVID-19 declaration under Section 564(b)(1) of the Act, 21 U.S.C. section 360bbb-3(b)(1), unless the authorization is terminated or revoked.  Performed at [facility], [address], [place], [place] [address]     Procedures and diagnostic studies:  DG Chest 2 View  Result Date: 08/27/2020 CLINICAL DATA:  Shortness of breath. EXAM: CHEST - 2 VIEW COMPARISON:  March 07, 2016. FINDINGS: Stable cardiomediastinal silhouette. Status post transcatheter aortic valve repair. Left-sided pacemaker is noted with leads in grossly good position. No pneumothorax or pleural effusion is noted. Mild central pulmonary vascular congestion is noted. Small left pleural effusion is noted with adjacent left basilar atelectasis or infiltrate. Bony thorax is unremarkable. IMPRESSION: Central pulmonary vascular congestion is noted. Small left pleural effusion with adjacent left basilar atelectasis or infiltrate. Aortic Atherosclerosis (ICD10-I70.0). Electronically Signed   By: Marijo Conception M.D.   On: 08/27/2020 15:26               LOS: 1 day   Virginia Curl  [facility]   Pager on [email or web address]. If 7PM-7AM, please contact night-coverage at [email or web address]     08/28/2020, 8:51 AM

## 2020-08-28 NOTE — Progress Notes (Signed)
*  PRELIMINARY RESULTS* Echocardiogram 2D Echocardiogram has been performed.  Sherrie Sport 08/28/2020, 10:29 AM

## 2020-08-29 ENCOUNTER — Inpatient Hospital Stay: Payer: Medicare Other

## 2020-08-29 DIAGNOSIS — J90 Pleural effusion, not elsewhere classified: Secondary | ICD-10-CM

## 2020-08-29 LAB — GLUCOSE, CAPILLARY
Glucose-Capillary: 194 mg/dL — ABNORMAL HIGH (ref 70–99)
Glucose-Capillary: 201 mg/dL — ABNORMAL HIGH (ref 70–99)
Glucose-Capillary: 133 mg/dL — ABNORMAL HIGH (ref 70–99)
Glucose-Capillary: 280 mg/dL — ABNORMAL HIGH (ref 70–99)

## 2020-08-29 LAB — PROTEIN, PLEURAL OR PERITONEAL FLUID: Total protein, fluid: 3.3 g/dL

## 2020-08-29 LAB — BASIC METABOLIC PANEL
Anion gap: 6 (ref 5–15)
BUN: 38 mg/dL — ABNORMAL HIGH (ref 8–23)
CO2: 31 mmol/L (ref 22–32)
Calcium: 8.7 mg/dL — ABNORMAL LOW (ref 8.9–10.3)
Chloride: 103 mmol/L (ref 98–111)
Creatinine, Ser: 1.79 mg/dL — ABNORMAL HIGH (ref 0.61–1.24)
GFR, Estimated: 36 mL/min — ABNORMAL LOW (ref >60)
Glucose, Bld: 143 mg/dL — ABNORMAL HIGH (ref 70–99)
Potassium: 4.8 mmol/L (ref 3.5–5.1)
Sodium: 140 mmol/L (ref 135–145)

## 2020-08-29 LAB — HEMOGLOBIN A1C
Hgb A1c MFr Bld: 6.6 % — ABNORMAL HIGH (ref 4.8–5.6)
Mean Plasma Glucose: 143 mg/dL

## 2020-08-29 LAB — LACTATE DEHYDROGENASE, PLEURAL OR PERITONEAL FLUID: LD, Fluid: 70 U/L — ABNORMAL HIGH (ref 3–23)

## 2020-08-29 LAB — GLUCOSE, PLEURAL OR PERITONEAL FLUID: Glucose, Fluid: 184 mg/dL

## 2020-08-29 LAB — MAGNESIUM: Magnesium: 1.8 mg/dL (ref 1.7–2.4)

## 2020-08-29 NOTE — Consult Note (Signed)
Heart Failure Nurse Navigator Note  HFpEF 50 to 55%.  Normal right ventricular systolic function.  Mildly elevated pulmonary artery systolic pressure.  He presented to the emergency room with increasing shortness of breath on exertion and orthopnea and accompanying lower extremity edema.   Comorbidities:  Diabetes Chronic kidney disease Coronary artery disease   Status post TAVR for aortic stenosis.  Medications:  Apixaban 5 mg 2 times a day Lipitor 20 mg daily Coreg 3.125 mg 2 times a day Lasix 60 mg IV 2 times a day   Labs:  Sodium 140, potassium 4.8, chloride 103, CO2 31, BUN 38, creatinine 1.79, Intake not documented Output 4700 mL Weight 95.3 kg, yesterday 95.5 kg.  Met with the patient today.  States that he is familiar with the term heart failure.  He lives at home by himself.  He does his own meal preparation which he states a lot of times he is eating sandwiches.  Does not use processed meats.  He has not used salt at the table for many years.  Discussed his fluid intake, recommended no more than 64 ounces.  He states that he drinks 4-16 ounce bottles of water daily along with 2 to 3 cups of coffee.  He does weigh himself daily, discussed to report 2 pound weight gain overnight or 5 pounds within the week.  He voices understanding.  He was given Living with Heart Failure teaching booklet and low sodium info, weight chart and taking charge of your heart failure handout.  Will continue to follow.  Pricilla Riffle RN [facility]

## 2020-08-29 NOTE — Procedures (Signed)
Interventional Radiology Procedure:   Indications: Shortness of breath and pleural effusions  Procedure: US guided thoracentesis  Findings: Removed 1850 ml from left chest.  Complications: None     EBL: Less than 10 ml  Plan: Follow up CXR   Aseel Truxillo R. Anselm Pancoast, MD  Pager: [phone]

## 2020-08-29 NOTE — Plan of Care (Signed)
  Problem: Education: Goal: Knowledge of General Education information will improve Description: Including pain rating scale, medication(s)/side effects and non-pharmacologic comfort measures Outcome: Progressing   Problem: Education: Goal: Ability to demonstrate management of disease process will improve Outcome: Progressing   Problem: Activity: Goal: Capacity to carry out activities will improve Outcome: Progressing   Problem: Cardiac: Goal: Ability to achieve and maintain adequate cardiopulmonary perfusion will improve Outcome: Progressing

## 2020-08-29 NOTE — Progress Notes (Signed)
[facility] [place] NAME: Hayden Martin    MR#:  [id number]  DATE OF BIRTH:  1931-12-29  SUBJECTIVE:   patient came in with increasing shortness of breath. He is relatively feeling better however continues to have lower extremity edema. Currently on room air. Does not wear chronic home oxygen. Good urine output. Family in the room. Denies any chest pain REVIEW OF SYSTEMS:   Review of Systems  Constitutional:  Negative for chills, fever and weight loss.  HENT:  Negative for ear discharge, ear pain and nosebleeds.   Eyes:  Negative for blurred vision, pain and discharge.  Respiratory:  Positive for shortness of breath. Negative for sputum production, wheezing and stridor.   Cardiovascular:  Positive for leg swelling. Negative for chest pain, palpitations, orthopnea and PND.  Gastrointestinal:  Negative for abdominal pain, diarrhea, nausea and vomiting.  Genitourinary:  Negative for frequency and urgency.  Musculoskeletal:  Negative for back pain and joint pain.  Neurological:  Positive for weakness. Negative for sensory change, speech change and focal weakness.  Psychiatric/Behavioral:  Negative for depression and hallucinations. The patient is not nervous/anxious.   Tolerating Diet:yes Tolerating PT: self ambulatory  DRUG ALLERGIES:   Allergies  Allergen Reactions   Codeine Itching   Penicillins Itching    VITALS:  Blood pressure 126/63, pulse 62, temperature 98.3 F (36.8 C), resp. rate 19, height 5\' 11"  (1.803 m), weight 95.3 kg, SpO2 91 %.  PHYSICAL EXAMINATION:   Physical Exam  GENERAL:  87 y.o.-year-old patient lying in the bed with no acute distress.  LUNGS: decreased breath sounds Left > right, no wheezing, rales, rhonchi. No use of accessory muscles of respiration.  CARDIOVASCULAR: S1, S2 normal. No murmurs, rubs, or gallops.  ABDOMEN: Soft, nontender, nondistended. Bowel sounds present. No organomegaly or mass.   EXTREMITIES: ++ edema b/l.    NEUROLOGIC: non focal PSYCHIATRIC:  patient is alert and oriented x 3.  SKIN: No obvious rash, lesion, or ulcer.   LABORATORY PANEL:  CBC Recent Labs  Lab 08/27/20 1415  WBC 7.4  HGB 12.8*  HCT 39.2  PLT 93*    Chemistries  Recent Labs  Lab 08/29/20 0437  NA 140  K 4.8  CL 103  CO2 31  GLUCOSE 143*  BUN 38*  CREATININE 1.79*  CALCIUM 8.7*  MG 1.8   Cardiac Enzymes No results for input(s): TROPONINI in the last 168 hours. RADIOLOGY:  ECHOCARDIOGRAM COMPLETE  Result Date: 08/28/2020    ECHOCARDIOGRAM REPORT   Patient Name:   Hayden Martin Date of Exam: 08/28/2020 Medical Rec #:  [id number]        Height:       71.0 in Accession #:    [id number]       Weight:       210.4 lb Date of Birth:  08/04/1931        BSA:          2.155 m Patient Age:    71 years         BP:           128/53 mmHg Patient Gender: M                HR:           60 bpm. Exam Location:  [facility] Procedure: 2D Echo, Cardiac Doppler and Color Doppler Indications:     CHF-acute systolic V42.59  History:  Patient has prior history of Echocardiogram examinations, most                  recent 05/12/2016. CHF; Risk Factors:Hypertension and Diabetes.  Sonographer:     Sherrie Sport RDCS (AE) Referring Phys:  [id number] Gretta Cool Inza Mikrut Diagnosing Phys: Yolonda Kida MD  Sonographer Comments: Suboptimal apical window. IMPRESSIONS  1. Left ventricular ejection fraction, by estimation, is 50 to 55%. The left ventricle has low normal function. The left ventricle has no regional wall motion abnormalities. Left ventricular diastolic parameters were normal.  2. Right ventricular systolic function is normal. The right ventricular size is mildly enlarged. Mildly increased right ventricular wall thickness. There is mildly elevated pulmonary artery systolic pressure.  3. Moderate pleural effusion in the left lateral region.  4. The mitral valve is normal in structure. No evidence of mitral valve  regurgitation.  5. The aortic valve is calcified. There is moderate calcification of the aortic valve. There is moderate thickening of the aortic valve. Aortic valve regurgitation is not visualized. Moderate to severe aortic valve stenosis. FINDINGS  Left Ventricle: Left ventricular ejection fraction, by estimation, is 50 to 55%. The left ventricle has low normal function. The left ventricle has no regional wall motion abnormalities. The left ventricular internal cavity size was normal in size. There is no left ventricular hypertrophy. Left ventricular diastolic parameters were normal. Right Ventricle: The right ventricular size is mildly enlarged. Mildly increased right ventricular wall thickness. Right ventricular systolic function is normal. There is mildly elevated pulmonary artery systolic pressure. Left Atrium: Left atrial size was normal in size. Right Atrium: Right atrial size was normal in size. Pericardium: The pericardium was not well visualized. Mitral Valve: The mitral valve is normal in structure. There is mild thickening of the mitral valve leaflet(s). No evidence of mitral valve regurgitation. MV peak gradient, 6.2 mmHg. The mean mitral valve gradient is 2.0 mmHg. Tricuspid Valve: The tricuspid valve is normal in structure. Tricuspid valve regurgitation is mild. Aortic Valve: The aortic valve is calcified. There is moderate calcification of the aortic valve. There is moderate thickening of the aortic valve. Aortic valve regurgitation is not visualized. Moderate to severe aortic stenosis is present. Aortic valve mean gradient measures 8.0 mmHg. Aortic valve peak gradient measures 13.9 mmHg. Aortic valve area, by VTI measures 0.75 cm. Pulmonic Valve: The pulmonic valve was normal in structure. Pulmonic valve regurgitation is not visualized. Aorta: The ascending aorta was not well visualized. IAS/Shunts: No atrial level shunt detected by color flow Doppler. Additional Comments: A device lead is  visualized. There is a moderate pleural effusion in the left lateral region.  LEFT VENTRICLE PLAX 2D LVIDd:         5.15 cm  Diastology LVIDs:         3.72 cm  LV e' lateral:   5.44 cm/s LV PW:         1.21 cm  LV E/e' lateral: 24.4 LV IVS:        0.91 cm LVOT diam:     2.00 cm LV SV:         28 LV SV Index:   13 LVOT Area:     3.14 cm  RIGHT VENTRICLE RV Basal diam:  3.46 cm RV S prime:     6.91 cm/s TAPSE (M-mode): 4.4 cm LEFT ATRIUM             Index       RIGHT ATRIUM  Index LA diam:        5.20 cm 2.41 cm/m  RA Area:     13.90 cm LA Vol (A2C):   92.1 ml 42.75 ml/m RA Volume:   34.20 ml  15.87 ml/m LA Vol (A4C):   86.7 ml 40.24 ml/m LA Biplane Vol: 91.8 ml 42.61 ml/m  AORTIC VALVE                    PULMONIC VALVE AV Area (Vmax):    0.74 cm     PV Vmax:        0.60 m/s AV Area (Vmean):   0.74 cm     PV Peak grad:   1.4 mmHg AV Area (VTI):     0.75 cm     RVOT Peak grad: 2 mmHg AV Vmax:           186.67 cm/s AV Vmean:          130.333 cm/s AV VTI:            0.371 m AV Peak Grad:      13.9 mmHg AV Mean Grad:      8.0 mmHg LVOT Vmax:         43.70 cm/s LVOT Vmean:        30.800 cm/s LVOT VTI:          0.088 m LVOT/AV VTI ratio: 0.24  AORTA Ao Root diam: 3.00 cm MITRAL VALVE                TRICUSPID VALVE MV Area (PHT): 4.96 cm     TR Peak grad:   46.5 mmHg MV Area VTI:   0.79 cm     TR Vmax:        341.00 cm/s MV Peak grad:  6.2 mmHg MV Mean grad:  2.0 mmHg     SHUNTS MV Vmax:       1.25 m/s     Systemic VTI:  0.09 m MV Vmean:      59.6 cm/s    Systemic Diam: 2.00 cm MV Decel Time: 153 msec MV E velocity: 133.00 cm/s Yolonda Kida MD Electronically signed by Yolonda Kida MD Signature Date/Time: 08/28/2020/4:48:34 PM    Final    ASSESSMENT AND PLAN:  Hayden Martin is a 87 y.o. male with past medical history significant for chronic diastolic CHF,TAVR for aortic stenosis in February 2022, s/p permanent pacemaker, CAD, diffuse large B-cell lymphoma, CKD stage IIIb, history of DVT in  the left lower extremity on Eliquis s/p IVC filter in November 2017, presented to the hospital because of increasing shortness of breath and lower extremity edema.  Acute on chronic diastolic CHF with peripheral edema:  Left pleural effusion as noted on echo H/o AS s/p TAVR --Continue IV Lasix.   --Monitor BMP, daily weight and urine output.  -- 2D echo EF 50-55%.Right ventricular systolic function is normal. The right ventricular size is mildly enlarged. Mildly increased right ventricular wall  thickness. There is mildly elevated pulmonary artery systolic pressure. Moderate thickening of aortic valve. Moderate left pleural effusion --discussed with IR  Dr Anselm Pancoast for pt to get therapeutic thoracentesis.   Acute hypoxic respiratory failure: Continue oxygen via nasal cannula and taper off oxygen as able.   CAD, s/p TAVR in February 2022: He is on Eliquis   Hypertension: Continue antihypertensives   History of lower extremity DVT s/p IVC filter in 2017: Continue Eliquis   CKD stage IIIb: Monitor  creatinine closely   Acute urinary retention: Bladder scan in the ED showed 649 mls of urine.  discontinue Foley catheter  and cont flomax and proscar   H/o Permanent pacemaker    Procedures: Family communication :son and DIL in room Consults : IR CODE STATUS: FULL DVT Prophylaxis :eliquis Level of care: Progressive Cardiac Status is: Inpatient  Remains inpatient appropriate because:IV treatments appropriate due to intensity of illness or inability to take PO  Dispo: The patient is from: Home              Anticipated d/c is to: Home              Patient currently is not medically stable to d/c.   Difficult to place patient No     TOTAL TIME TAKING CARE OF THIS PATIENT: 25 minutes.  >50% time spent on counselling and coordination of care  Note: This dictation was prepared with Dragon dictation along with smaller phrase technology. Any transcriptional errors that result from this  process are unintentional.  Fritzi Mandes M.D    [facility]   CC: Primary care physician; Kirk Ruths, MD Patient ID: Hayden Martin, male   DOB: May 23, 1931, 87 y.o.   MRN: [id number]

## 2020-08-30 DIAGNOSIS — R0602 Shortness of breath: Secondary | ICD-10-CM

## 2020-08-30 DIAGNOSIS — Z9889 Other specified postprocedural states: Secondary | ICD-10-CM

## 2020-08-30 LAB — BASIC METABOLIC PANEL
Anion gap: 7 (ref 5–15)
BUN: 47 mg/dL — ABNORMAL HIGH (ref 8–23)
CO2: 32 mmol/L (ref 22–32)
Calcium: 8.7 mg/dL — ABNORMAL LOW (ref 8.9–10.3)
Chloride: 101 mmol/L (ref 98–111)
Creatinine, Ser: 1.8 mg/dL — ABNORMAL HIGH (ref 0.61–1.24)
GFR, Estimated: 36 mL/min — ABNORMAL LOW (ref >60)
Glucose, Bld: 181 mg/dL — ABNORMAL HIGH (ref 70–99)
Potassium: 3.9 mmol/L (ref 3.5–5.1)
Sodium: 140 mmol/L (ref 135–145)

## 2020-08-30 LAB — CBC
HCT: 38.4 % — ABNORMAL LOW (ref 39.0–52.0)
Hemoglobin: 12.8 g/dL — ABNORMAL LOW (ref 13.0–17.0)
MCH: 30.1 pg (ref 26.0–34.0)
MCHC: 33.3 g/dL (ref 30.0–36.0)
MCV: 90.4 fL (ref 80.0–100.0)
Platelets: 89 10*3/uL — ABNORMAL LOW (ref 150–400)
RBC: 4.25 MIL/uL (ref 4.22–5.81)
RDW: 14.6 % (ref 11.5–15.5)
WBC: 7.7 10*3/uL (ref 4.0–10.5)
nRBC: 0 % (ref 0.0–0.2)

## 2020-08-30 LAB — GLUCOSE, CAPILLARY
Glucose-Capillary: 165 mg/dL — ABNORMAL HIGH (ref 70–99)
Glucose-Capillary: 145 mg/dL — ABNORMAL HIGH (ref 70–99)

## 2020-08-30 LAB — PH, BODY FLUID: pH, Body Fluid: 7.6

## 2020-08-30 MED ORDER — LISINOPRIL 5 MG PO TABS: 5.0000 mg | ORAL_TABLET | Freq: Every day | ORAL | 0 refills | Status: AC

## 2020-08-30 MED ORDER — ALLOPURINOL 100 MG PO TABS: 100.0000 mg | ORAL_TABLET | Freq: Every day | ORAL | 0 refills | Status: AC

## 2020-08-30 NOTE — Care Management Important Message (Signed)
Important Message  Patient Details  Name: Hayden Martin MRN: [id number] Date of Birth: 22-Jul-1931   Medicare Important Message Given:  Yes     Dannette Barbara 08/30/2020, 12:55 PM

## 2020-08-30 NOTE — Discharge Summary (Signed)
[facility] [place] NAME: Hayden Martin    MR#:  [id number]  DATE OF BIRTH:  [birth year]  DATE OF ADMISSION:  08/27/2020 ADMITTING PHYSICIAN: Para Skeans, MD  DATE OF DISCHARGE: 08/30/2020  PRIMARY CARE PHYSICIAN: Kirk Ruths, MD    ADMISSION DIAGNOSIS:  Shortness of breath [R06.02] Acute on chronic heart failure (HCC) [I50.9] Acute urinary retention [R33.8] Acute on chronic congestive heart failure, unspecified heart failure type ([place]) [I50.9]  DISCHARGE DIAGNOSIS:  acute on chronic diastolic congestive heart failure left sided pleural effusion status post thoracentesis  SECONDARY DIAGNOSIS:   Past Medical History:  Diagnosis Date   Cancer ([place])    CHF (congestive heart failure) ([place])    Clotting disorder ([place])    Diabetes mellitus without complication ([place])    Heart disease    Hypertension     HOSPITAL COURSE:   Hayden Martin is a 87 y.o. male with past medical history significant for chronic diastolic CHF,TAVR for aortic stenosis in February 2022, s/p permanent pacemaker, CAD, diffuse large B-cell lymphoma, CKD stage IIIb, history of DVT in the left lower extremity on Eliquis s/p IVC filter in November 2017, presented to the hospital because of increasing shortness of breath and lower extremity edema.   Acute on chronic diastolic CHF with peripheral edema:  Left pleural effusion as noted on echo H/o AS s/p TAVR --Continue IV Lasix--change to po lasix 40 mg daily -- urine output 10.5 L. Weight down by approximately 9 kg. Patient overall feeling at baseline. -- 2D echo EF 50-55%.Right ventricular systolic function is normal. The right ventricular size is mildly enlarged. Mildly increased right ventricular wall  thickness. There is mildly elevated pulmonary artery systolic pressure. Moderate thickening of aortic valve. Moderate left pleural effusion -- status post left-sided thoracentesis by interventional  radiology. Removal of 1.8 L fluid. -- sats more than 91% on room air   Acute hypoxic respiratory failure: resolved  CAD, s/p TAVR in February 2022: He is on Eliquis   Hypertension: Continue antihypertensives   History of lower extremity DVT s/p IVC filter in 2017: Continue Eliquis   CKD stage IIIb: Monitor creatinine closely -- creatinine at discharge 1.8   Acute urinary retention: Bladder scan in the ED showed 649 mls of urine.  discontinue Foley catheter  and cont proscar -- patient able to urinate well.   H/o Permanent pacemaker    overall clinically improved. Will discharge patient to home. Discussed discharge plan with patient and daughter at bedside. Patient will follow-up with Dr. Ubaldo Glassing July appointment.   Procedures: left sided thoracentesis Family communication : daughter in room Consults : IR CODE STATUS: FULL DVT Prophylaxis :eliquis Level of care: Progressive Cardiac Status is: Inpatient   Dispo: The patient is from: Home              Anticipated d/c is to: Home              Patient currently is medically stable to d/c.              Difficult to place patient No     CONSULTS OBTAINED:    DRUG ALLERGIES:   Allergies  Allergen Reactions   Codeine Itching   Penicillins Itching    DISCHARGE MEDICATIONS:   Allergies as of 08/30/2020       Reactions   Codeine Itching   Penicillins Itching        Medication List     STOP  taking these medications    amLODipine 10 MG tablet Commonly known as: NORVASC       TAKE these medications    allopurinol 100 MG tablet Commonly known as: Zyloprim Take 1 tablet (100 mg total) by mouth daily.   apixaban 5 MG Tabs tablet Commonly known as: ELIQUIS Take 5 mg by mouth 2 (two) times daily.   atorvastatin 40 MG tablet Commonly known as: LIPITOR Take 20 mg by mouth daily.   carvedilol 6.25 MG tablet Commonly known as: COREG Take 6.25 mg by mouth 2 (two) times daily.   finasteride 5 MG  tablet Commonly known as: PROSCAR Take 1 tablet (5 mg total) by mouth daily.   furosemide 20 MG tablet Commonly known as: LASIX Take 40 mg by mouth daily. Notes to patient: Take 20 mg extra tab if your weight is up >5 lbs   LANTUS  Inject 15 Units into the skin at bedtime.   liraglutide 18 MG/3ML Sopn Commonly known as: VICTOZA Inject 1.2 mg into the skin daily.   lisinopril 5 MG tablet Commonly known as: ZESTRIL Take 1 tablet (5 mg total) by mouth daily. What changed:  medication strength how much to take   silodosin 4 MG Caps capsule Commonly known as: RAPAFLO Take 1 capsule (4 mg total) by mouth daily with breakfast.        If you experience worsening of your admission symptoms, develop shortness of breath, life threatening emergency, suicidal or homicidal thoughts you must seek medical attention immediately by calling 911 or calling your MD immediately  if symptoms less severe.  You Must read complete instructions/literature along with all the possible adverse reactions/side effects for all the Medicines you take and that have been prescribed to you. Take any new Medicines after you have completely understood and accept all the possible adverse reactions/side effects.   Please note  You were cared for by a hospitalist during your hospital stay. If you have any questions about your discharge medications or the care you received while you were in the hospital after you are discharged, you can call the unit and asked to speak with the hospitalist on call if the hospitalist that took care of you is not available. Once you are discharged, your primary care physician will handle any further medical issues. Please note that NO REFILLS for any discharge medications will be authorized once you are discharged, as it is imperative that you return to your primary care physician (or establish a relationship with a primary care physician if you do not have one) for your aftercare needs  so that they can reassess your need for medications and monitor your lab values. Today   SUBJECTIVE   feels a lot better. lower extremity edema improving. Breathing much better after thoracentesis.  VITAL SIGNS:  Blood pressure 127/83, pulse (!) 54, temperature 97.8 F (36.6 C), resp. rate 17, height 5\' 11"  (1.803 m), weight 91.4 kg, SpO2 91 %.  I/O:   Intake/Output Summary (Last 24 hours) at 08/30/2020 1048 Last data filed at 08/30/2020 1042 Gross per 24 hour  Intake 960 ml  Output 3300 ml  Net -2340 ml    PHYSICAL EXAMINATION:  GENERAL:  87 y.o.-year-old patient lying in the bed with no acute distress.   LUNGS: Normal breath sounds bilaterally, no wheezing, rales,rhonchi or crepitation. No use of accessory muscles of respiration.  CARDIOVASCULAR: S1, S2 normal. No murmurs, rubs, or gallops.  ABDOMEN: Soft, non-tender, non-distended. Bowel sounds present. No organomegaly or mass.  EXTREMITIES: 1+ pedal edema, cyanosis, or clubbing.  NEUROLOGIC: Cranial nerves II through XII are intact. Muscle strength 5/5 in all extremities. Sensation intact. Gait not checked.  PSYCHIATRIC: The patient is alert and oriented x 3.  SKIN: No obvious rash, lesion, or ulcer.   DATA REVIEW:   CBC  Recent Labs  Lab 08/30/20 0429  WBC 7.7  HGB 12.8*  HCT 38.4*  PLT 89*    Chemistries  Recent Labs  Lab 08/29/20 0437 08/30/20 0429  NA 140 140  K 4.8 3.9  CL 103 101  CO2 31 32  GLUCOSE 143* 181*  BUN 38* 47*  CREATININE 1.79* 1.80*  CALCIUM 8.7* 8.7*  MG 1.8  --     Microbiology Results   Recent Results (from the past 240 hour(s))  Resp Panel by RT-PCR (Flu A&B, Covid) Nasopharyngeal Swab     Status: None   Collection Time: 08/27/20  3:31 PM   Specimen: Nasopharyngeal Swab; Nasopharyngeal(NP) swabs in vial transport medium  Result Value Ref Range Status   SARS Coronavirus 2 by RT PCR NEGATIVE NEGATIVE Final    Comment: (NOTE) SARS-CoV-2 target nucleic acids are NOT  DETECTED.  The SARS-CoV-2 RNA is generally detectable in upper respiratory specimens during the acute phase of infection. The lowest concentration of SARS-CoV-2 viral copies this assay can detect is 138 copies/mL. A negative result does not preclude SARS-Cov-2 infection and should not be used as the sole basis for treatment or other patient management decisions. A negative result may occur with  improper specimen collection/handling, submission of specimen other than nasopharyngeal swab, presence of viral mutation(s) within the areas targeted by this assay, and inadequate number of viral copies(<138 copies/mL). A negative result must be combined with clinical observations, patient history, and epidemiological information. The expected result is Negative.  Fact Sheet for Patients:  EntrepreneurPulse.com.au  Fact Sheet for Healthcare Providers:  IncredibleEmployment.be  This test is no t yet approved or cleared by the [place] FDA and  has been authorized for detection and/or diagnosis of SARS-CoV-2 by FDA under an Emergency Use Authorization (EUA). This EUA will remain  in effect (meaning this test can be used) for the duration of the COVID-19 declaration under Section 564(b)(1) of the Act, 21 U.S.C.section 360bbb-3(b)(1), unless the authorization is terminated  or revoked sooner.       Influenza A by PCR NEGATIVE NEGATIVE Final   Influenza B by PCR NEGATIVE NEGATIVE Final    Comment: (NOTE) The Xpert Xpress SARS-CoV-2/FLU/RSV plus assay is intended as an aid in the diagnosis of influenza from Nasopharyngeal swab specimens and should not be used as a sole basis for treatment. Nasal washings and aspirates are unacceptable for Xpert Xpress SARS-CoV-2/FLU/RSV testing.  Fact Sheet for Patients: EntrepreneurPulse.com.au  Fact Sheet for Healthcare Providers: IncredibleEmployment.be  This test is not yet  approved or cleared by the [place] FDA and has been authorized for detection and/or diagnosis of SARS-CoV-2 by FDA under an Emergency Use Authorization (EUA). This EUA will remain in effect (meaning this test can be used) for the duration of the COVID-19 declaration under Section 564(b)(1) of the Act, 21 U.S.C. section 360bbb-3(b)(1), unless the authorization is terminated or revoked.  Performed at [facility], [address] [place], [place] [address]     RADIOLOGY:  [facility] Chest Port 1 View  Result Date: 08/29/2020 CLINICAL DATA:  Status post thoracentesis EXAM: PORTABLE CHEST 1 VIEW COMPARISON:  August 27, 2020 FINDINGS: No appreciable pneumothorax. Left pleural effusion smaller  after thoracentesis. Small residual left pleural effusion. Mild bibasilar atelectasis. No edema or consolidation. There is cardiomegaly with pulmonary vascularity normal. There is an aortic valve replacement with pacemaker leads attached to the right atrium and right ventricle. There is aortic atherosclerosis. No adenopathy. No bone lesions. IMPRESSION: No pneumothorax. Small residual left pleural effusion. Bibasilar atelectasis. Stable cardiomegaly with pacemaker leads attached to right atrium and right ventricle and aortic valve replacement. Aortic Atherosclerosis (ICD10-I70.0). Electronically Signed   By: Lowella Grip III M.D.   On: 08/29/2020 16:47   US THORACENTESIS ASP PLEURAL SPACE W/IMG GUIDE  Result Date: 08/29/2020 INDICATION: 87 year old with shortness of breath and pleural effusions. EXAM: ULTRASOUND GUIDED LEFT THORACENTESIS MEDICATIONS: None. COMPLICATIONS: None immediate. PROCEDURE: An ultrasound guided thoracentesis was thoroughly discussed with the patient and questions answered. The benefits, risks, alternatives and complications were also discussed. The patient understands and wishes to proceed with the procedure. Written consent was obtained. Ultrasound was performed to localize and  mark an adequate pocket of fluid in the left chest. The area was then prepped and draped in the normal sterile fashion. 1% Lidocaine was used for local anesthesia. Under ultrasound guidance a 6 Fr Safe-T-Centesis catheter was introduced. Thoracentesis was performed. The catheter was removed and a dressing applied. FINDINGS: A total of approximately 1850 mL of yellow fluid was removed. Samples were sent to the laboratory as requested by the clinical team. Right pleural effusion. IMPRESSION: Successful ultrasound guided left thoracentesis yielding 1850 mL of pleural fluid. Electronically Signed   By: Markus Daft M.D.   On: 08/29/2020 16:42     CODE STATUS:     Code Status Orders  (From admission, onward)           Start     Ordered   08/27/20 2116  Full code  Continuous        08/27/20 2115           Code Status History     Date Active Date Inactive Code Status Order ID Comments User Context   08/27/2020 1743 08/27/2020 2115 DNR [id number]  Para Skeans, MD ED   01/15/2016 1511 01/28/2016 1638 Full Code [id number]  Dustin Flock, MD Inpatient      Advance Directive Documentation    Flowsheet Row Most Recent Value  Type of Advance Directive Healthcare Power of [place], Living will  [son Kirk Shular]  Pre-existing out of facility DNR order (yellow form or pink MOST form) --  "MOST" Form in Place? --        TOTAL TIME TAKING CARE OF THIS PATIENT: 40 minutes.    Fritzi Mandes M.D  [facility]    CC: Primary care physician; Kirk Ruths, MD

## 2020-08-30 NOTE — Plan of Care (Signed)
  Problem: Education: Goal: Ability to demonstrate management of disease process will improve Outcome: Adequate for Discharge   Problem: Activity: Goal: Capacity to carry out activities will improve Outcome: Adequate for Discharge

## 2020-09-02 NOTE — Progress Notes (Signed)
Patient ID: Hayden Martin, male    DOB: [birth year], 87 y.o.   MRN: [id number]  HPI  Hayden Martin is a 87 y/o male with a history of DM, HTN, heart disease, previous tobacco use and chronic heart failure.   Echo report from 08/28/20 reviewed and showed an EF of 50-55% along with mildly elevated PA pressure and moderate/severe AS.   Admitted 08/27/20 due to acute on chronic HF. Initially given IV lasix with transition to oral diuretics. Had left-sided thoracentesis by interventional radiology with removal of 1.8 L fluid. Discharged after 3 days.   He presents today for his initial visit with a chief complaint of moderate fatigue upon minimal exertion. He describes this as having been present for several months prior to his TAVR Feb 2022. He has associated pedal edema and easy bruising along with this. He denies any difficulty sleeping, dizziness, abdominal distention, palpitations, chest pain, shortness of breath, cough or weight gain.   Using compression boots at home ~ once/ day. Does not like wearing compression socks  Past Medical History:  Diagnosis Date   Cancer ([place])    CHF (congestive heart failure) (HCC)    Clotting disorder ([place])    Diabetes mellitus without complication ([place])    Heart disease    Hypertension    Past Surgical History:  Procedure Laterality Date   HERNIA REPAIR     PACEMAKER PLACEMENT  04/11/2020   PERIPHERAL VASCULAR CATHETERIZATION N/A 01/18/2016   Procedure: IVC Filter Insertion;  Surgeon: Katha Cabal, MD;  Location: [place] [facility];  Service: Cardiovascular;  Laterality: N/A;   PERIPHERAL VASCULAR CATHETERIZATION N/A 01/21/2016   Procedure: Glori Luis Cath Insertion;  Surgeon: Algernon Huxley, MD;  Location: [place] [facility];  Service: Cardiovascular;  Laterality: N/A;   PORTA CATH REMOVAL N/A 01/07/2018   Procedure: PORTA CATH REMOVAL;  Surgeon: Algernon Huxley, MD;  Location: [place] [facility];  Service: Cardiovascular;  Laterality: N/A;    TRANSCATHETER AORTIC VALVE REPLACEMENT, TRANSAORTIC  04/09/2020   History reviewed. No pertinent family history. Social History   Tobacco Use   Smoking status: Former    Packs/day: 1.50    Pack years: 0.00    Types: Cigarettes    Quit date: 01/01/1986    Years since quitting: 34.6   Smokeless tobacco: Former   Tobacco comments:    Smoked for 20 years  Substance Use Topics   Alcohol use: Never   Allergies  Allergen Reactions   Codeine Itching   Penicillins Itching   Prior to Admission medications   Medication Sig Start Date End Date Taking? Authorizing Provider  amLODipine (NORVASC) 10 MG tablet Take 10 mg by mouth daily.   Yes [provider]  apixaban (ELIQUIS) 5 MG TABS tablet Take 5 mg by mouth 2 (two) times daily.   Yes [provider]  atorvastatin (LIPITOR) 40 MG tablet Take 20 mg by mouth daily.   Yes [provider]  carvedilol (COREG) 6.25 MG tablet Take 6.25 mg by mouth 2 (two) times daily.   Yes [provider]  cholecalciferol (VITAMIN D3) 25 MCG (1000 UNIT) tablet Take 2,000 Units by mouth daily.   Yes [provider]  finasteride (PROSCAR) 5 MG tablet Take 1 tablet (5 mg total) by mouth daily. 03/05/20  Yes Vaillancourt, Samantha, PA-C  furosemide (LASIX) 20 MG tablet Take 40 mg by mouth daily.   Yes [provider]  Insulin Glargine (LANTUS [place]) Inject 15 Units into the  skin at bedtime.   Yes [provider]  liraglutide (VICTOZA) 18 MG/3ML SOPN Inject 1.2 mg into the skin daily.   Yes [provider]  lisinopril (ZESTRIL) 5 MG tablet Take 1 tablet (5 mg total) by mouth daily. 08/30/20  Yes Fritzi Mandes, MD  Multiple Vitamins-Minerals (CENTRUM PO) Take 1 tablet by mouth daily.   Yes [provider]  vitamin B-12 (CYANOCOBALAMIN) 500 MCG tablet Take 500 mcg by mouth daily.   Yes [provider]  allopurinol (ZYLOPRIM) 100 MG tablet Take 1 tablet (100 mg total) by mouth daily. 08/30/20    Fritzi Mandes, MD  silodosin (RAPAFLO) 4 MG CAPS capsule Take 1 capsule (4 mg total) by mouth daily with breakfast. Patient not taking: Reported on 09/04/2020 03/05/20   Debroah Loop, PA-C    Review of Systems  Constitutional:  Positive for fatigue (easily). Negative for appetite change.  HENT:  Negative for congestion, postnasal drip and sore throat.   Eyes: Negative.   Respiratory:  Negative for cough, chest tightness and shortness of breath.   Cardiovascular:  Positive for leg swelling. Negative for chest pain and palpitations.  Gastrointestinal:  Negative for abdominal distention and abdominal pain.  Endocrine: Negative.   Genitourinary: Negative.   Musculoskeletal:  Negative for back pain and neck pain.  Skin: Negative.   Allergic/Immunologic: Negative.   Neurological:  Negative for dizziness and light-headedness.  Hematological:  Negative for adenopathy. Bruises/bleeds easily.  Psychiatric/Behavioral:  Negative for dysphoric mood and sleep disturbance (sleeping on 1 pillow). The patient is not nervous/anxious.    Vitals:   09/04/20 1242  BP: (!) 124/51  Pulse: 63  Resp: 18  SpO2: 97%  Weight: 205 lb 6 oz (93.2 kg)  Height: 5\' 11"  (1.803 m)   Wt Readings from Last 3 Encounters:  09/04/20 205 lb 6 oz (93.2 kg)  08/30/20 201 lb 8 oz (91.4 kg)  03/15/20 208 lb 12.4 oz (94.7 kg)   Lab Results  Component Value Date   CREATININE 1.80 (H) 08/30/2020   CREATININE 1.79 (H) 08/29/2020   CREATININE 1.81 (H) 08/28/2020    Physical Exam Vitals and nursing note reviewed. Exam conducted with a chaperone present (daughter).  Constitutional:      Appearance: Normal appearance.  HENT:     Head: Normocephalic and atraumatic.  Cardiovascular:     Rate and Rhythm: Normal rate and regular rhythm.  Pulmonary:     Effort: Pulmonary effort is normal. No respiratory distress.     Breath sounds: No wheezing or rales.  Abdominal:     General: There is no distension.      Palpations: Abdomen is soft.  Musculoskeletal:        General: No tenderness.     Cervical back: Normal range of motion and neck supple.     Right lower leg: Edema (1+ pitting) present.     Left lower leg: Edema (2+ pitting) present.  Skin:    General: Skin is warm and dry.  Neurological:     General: No focal deficit present.     Mental Status: He is alert and oriented to person, place, and time.  Psychiatric:        Mood and Affect: Mood normal.        Thought Content: Thought content normal.    Assessment & Plan:  1: Chronic heart failure with preserved ejection fraction without LVH/LAE- - NYHA class III - euvolemic today - weighing daily and reminded to call for an overnight  weight gain of > 2 pounds or a weekly weight gain of > 5 pounds - not adding salt and trying to closely monitor sodium intake - saw cardiology ([place]) 06/28/20; returns 09/19/20 - will increase furosemide to 40mg  BID on M, W, F; other 4 days of the week, take 40mg  daily; may decrease the extra doses to twice/ week if he begins to lose too much weight - will check BMP next visit if not done elsewhere - pacemaker previously placed - using compression boots daily; encouraged to try wearing them BID if possible - drinking between 60-64 ounces of fluid daily; was previously drinking 110-120 ounces of fluid daily - BNP 08/27/20 was 1094.6  2: HTN- - BP looks good today - saw PCP Ouida Sills) 06/21/20; returns in 2 days - BMP 08/30/20 reviewed and showed sodium 140, potassium 3.9, creatinine 1.80 and GFR 36  3: DM- - A1c 08/28/20 was 6.6% - glucose at home this morning was 121  4: History of TAVR- - saw cardiology Justin Mend) 07/19/20   Medication bottles reviewed.   Return in 1 month or sooner for any questions/problems before then.

## 2020-09-04 ENCOUNTER — Ambulatory Visit: Payer: Medicare Other | Attending: Family | Admitting: Family

## 2020-09-04 ENCOUNTER — Other Ambulatory Visit: Payer: Self-pay

## 2020-09-04 ENCOUNTER — Encounter: Payer: Self-pay | Admitting: Family

## 2020-09-04 VITALS — BP 124/51 | HR 63 | Resp 18 | Ht 71.0 in | Wt 205.4 lb

## 2020-09-04 DIAGNOSIS — Z7901 Long term (current) use of anticoagulants: Secondary | ICD-10-CM | POA: Insufficient documentation

## 2020-09-04 DIAGNOSIS — E119 Type 2 diabetes mellitus without complications: Secondary | ICD-10-CM | POA: Diagnosis not present

## 2020-09-04 DIAGNOSIS — I10 Essential (primary) hypertension: Secondary | ICD-10-CM

## 2020-09-04 DIAGNOSIS — I509 Heart failure, unspecified: Secondary | ICD-10-CM | POA: Insufficient documentation

## 2020-09-04 DIAGNOSIS — I110 Hypertensive heart disease with heart failure: Secondary | ICD-10-CM | POA: Insufficient documentation

## 2020-09-04 DIAGNOSIS — Z87891 Personal history of nicotine dependence: Secondary | ICD-10-CM | POA: Diagnosis not present

## 2020-09-04 DIAGNOSIS — Z952 Presence of prosthetic heart valve: Secondary | ICD-10-CM | POA: Diagnosis not present

## 2020-09-04 DIAGNOSIS — Z950 Presence of cardiac pacemaker: Secondary | ICD-10-CM | POA: Insufficient documentation

## 2020-09-04 DIAGNOSIS — Z794 Long term (current) use of insulin: Secondary | ICD-10-CM

## 2020-09-04 DIAGNOSIS — I5032 Chronic diastolic (congestive) heart failure: Secondary | ICD-10-CM

## 2020-09-04 NOTE — Patient Instructions (Addendum)
Continue weighing daily and call for an overnight weight gain of > 2 pounds or a weekly weight gain of >5 pounds.    On Monday, Wednesday & Friday, take furosemide (fluid pill) as 40mg  twice a day.The other days of the week, take 40mg  every morning

## 2020-09-10 ENCOUNTER — Other Ambulatory Visit: Payer: Self-pay | Admitting: *Deleted

## 2020-09-10 DIAGNOSIS — C8333 Diffuse large B-cell lymphoma, intra-abdominal lymph nodes: Secondary | ICD-10-CM

## 2020-09-12 ENCOUNTER — Other Ambulatory Visit: Payer: Self-pay | Admitting: *Deleted

## 2020-09-12 ENCOUNTER — Inpatient Hospital Stay: Payer: Medicare Other | Admitting: Oncology

## 2020-09-12 ENCOUNTER — Other Ambulatory Visit: Payer: Self-pay

## 2020-09-12 ENCOUNTER — Encounter: Payer: Self-pay | Admitting: Oncology

## 2020-09-12 ENCOUNTER — Inpatient Hospital Stay: Payer: Medicare Other | Attending: Oncology

## 2020-09-12 VITALS — BP 95/72 | HR 46 | Temp 97.5°F | Resp 18 | Wt 201.8 lb

## 2020-09-12 DIAGNOSIS — C8333 Diffuse large B-cell lymphoma, intra-abdominal lymph nodes: Secondary | ICD-10-CM

## 2020-09-12 DIAGNOSIS — Z8579 Personal history of other malignant neoplasms of lymphoid, hematopoietic and related tissues: Secondary | ICD-10-CM | POA: Diagnosis not present

## 2020-09-12 DIAGNOSIS — Z86718 Personal history of other venous thrombosis and embolism: Secondary | ICD-10-CM | POA: Insufficient documentation

## 2020-09-12 DIAGNOSIS — Z7901 Long term (current) use of anticoagulants: Secondary | ICD-10-CM | POA: Diagnosis not present

## 2020-09-12 DIAGNOSIS — Z87891 Personal history of nicotine dependence: Secondary | ICD-10-CM | POA: Diagnosis not present

## 2020-09-12 DIAGNOSIS — E119 Type 2 diabetes mellitus without complications: Secondary | ICD-10-CM | POA: Diagnosis not present

## 2020-09-12 DIAGNOSIS — Z8572 Personal history of non-Hodgkin lymphomas: Secondary | ICD-10-CM | POA: Diagnosis not present

## 2020-09-12 DIAGNOSIS — Z79899 Other long term (current) drug therapy: Secondary | ICD-10-CM | POA: Insufficient documentation

## 2020-09-12 DIAGNOSIS — Z794 Long term (current) use of insulin: Secondary | ICD-10-CM | POA: Diagnosis not present

## 2020-09-12 DIAGNOSIS — Z08 Encounter for follow-up examination after completed treatment for malignant neoplasm: Secondary | ICD-10-CM

## 2020-09-12 LAB — COMPREHENSIVE METABOLIC PANEL
ALT: 22 U/L (ref 0–44)
AST: 17 U/L (ref 15–41)
Albumin: 4 g/dL (ref 3.5–5.0)
Alkaline Phosphatase: 53 U/L (ref 38–126)
Anion gap: 8 (ref 5–15)
BUN: 57 mg/dL — ABNORMAL HIGH (ref 8–23)
CO2: 29 mmol/L (ref 22–32)
Calcium: 8.9 mg/dL (ref 8.9–10.3)
Chloride: 102 mmol/L (ref 98–111)
Creatinine, Ser: 2.2 mg/dL — ABNORMAL HIGH (ref 0.61–1.24)
GFR, Estimated: 28 mL/min — ABNORMAL LOW (ref >60)
Glucose, Bld: 111 mg/dL — ABNORMAL HIGH (ref 70–99)
Potassium: 4.1 mmol/L (ref 3.5–5.1)
Sodium: 139 mmol/L (ref 135–145)
Total Bilirubin: 0.8 mg/dL (ref 0.3–1.2)
Total Protein: 7.4 g/dL (ref 6.5–8.1)

## 2020-09-12 LAB — CBC WITH DIFFERENTIAL/PLATELET
Abs Immature Granulocytes: 0.04 10*3/uL (ref 0.00–0.07)
Basophils Absolute: 0.1 10*3/uL (ref 0.0–0.1)
Basophils Relative: 1 %
Eosinophils Absolute: 0.2 10*3/uL (ref 0.0–0.5)
Eosinophils Relative: 3 %
HCT: 39.3 % (ref 39.0–52.0)
Hemoglobin: 12.8 g/dL — ABNORMAL LOW (ref 13.0–17.0)
Immature Granulocytes: 1 %
Lymphocytes Relative: 28 %
Lymphs Abs: 1.9 10*3/uL (ref 0.7–4.0)
MCH: 29.7 pg (ref 26.0–34.0)
MCHC: 32.6 g/dL (ref 30.0–36.0)
MCV: 91.2 fL (ref 80.0–100.0)
Monocytes Absolute: 0.7 10*3/uL (ref 0.1–1.0)
Monocytes Relative: 11 %
Neutro Abs: 3.8 10*3/uL (ref 1.7–7.7)
Neutrophils Relative %: 56 %
Platelets: 120 10*3/uL — ABNORMAL LOW (ref 150–400)
RBC: 4.31 MIL/uL (ref 4.22–5.81)
RDW: 14.5 % (ref 11.5–15.5)
WBC: 6.7 10*3/uL (ref 4.0–10.5)
nRBC: 0 % (ref 0.0–0.2)

## 2020-09-12 LAB — URIC ACID: Uric Acid, Serum: 10 mg/dL — ABNORMAL HIGH (ref 3.7–8.6)

## 2020-09-12 LAB — LACTATE DEHYDROGENASE: LDH: 192 U/L (ref 98–192)

## 2020-09-12 NOTE — Progress Notes (Signed)
Hematology/Oncology Consult note [facility]  [phone] [phone]  Patient Care Team: Kirk Ruths, MD as PCP - General (Internal Medicine)   Name of the patient: Hayden Martin  [id number]  March 19, 1931   Date of visit: 09/12/20  Diagnosis-history of diffuse large B-cell lymphoma stage IIIB  in 2017  Chief complaint/ Reason for visit-routine follow-up of diffuse large B-cell lymphoma  Heme/Onc history: Patient is a 87 year old male diagnosed with diffuse large B-cell lymphoma in November 2017.  At that time he had presented with B symptoms. CT-guided retroperitoneal lymph node biopsy on 01/21/2016 revealed a CD30 positive large B-cell lymphoma. Immunohistochemical studies were positive for CD20, CD30, BCL-2, BCL 6, Ki-67 (> 90%) and negative for CD10 and cyclin D1.   Bone marrow biopsy on 01/21/2016 revealed no evidence of lymphoma.  Flow cytometry was negative.  Cytogenetics were normal (46, XY).PET scan on 01/16/2016 revealed extensive hypermetabolic adenopathy within the neck, chest, abdomen and pelvis.  The spleen was enlarged with multi focal hypermetabolic splenic lesions  There was evidence of transperitoneal spread of tumor within the upper abdomen.  There were bilateral pleural effusions.  He received 6 cycles of mini-RCHOP (01/23/2016 - 05/16/2016).  Cycle #1 was complicated by tumor lysis syndrome, leukopenia, and symptomatic pleural effusions.  Diagnostic and 1 liter therapeutic left sided thoracentesis on 02/08/2016 revealed involvement with lymphoma.  PET scan on 04/23/2016 revealed an excellent response to treatment. The extensive adenopathy seen on the prior PET-CT showed remarkable improvement.  There was no residual metabolically active lymphoma is identified. There was a moderate left pleural effusion and small right pleural effusion.   PET scan on 06/06/2016 revealed continued improvement and near resolution. There  was only minimal retroperitoneal nodularity/adenopathy, much of which likely represents a residuum from previously treated lymphoma.  The residual periaortic lymph node measured 1.2 cm in short axis with maximum SUV 2.2 (formerly 1.4 cm and 3.6, respectively). The minimal residual nodal activity was Deauville 3.  He has a history of left lower extremity thrombosis.  He has had 2 clots 1 year apart and is on life long anticoagulation.  Bilateral lower extremity duplex on 01/16/2016 revealed DVT in the left lower extremity involving the calf veins and the left popliteal vein.  IVC filter was placed on 01/18/2016.  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  There was interval resolution of the left calf vein DVT.  He is on Xarelto.    Interval history-patient underwent TAVR procedure at [facility] in February 2022.  Following that patient was noted to have left pleural effusion as well as bilateral lower extremity swelling and admitted to [facility] in late June 2022.  He underwent thoracentesis but fluid was not checked for cytology.  Today patient feels better and back to his baseline.  He has chronic left lower extremity swelling more than right.  ECOG PS- 1 Pain scale- 0   Review of systems- Review of Systems  Constitutional:  Positive for malaise/fatigue. Negative for chills, fever and weight loss.  HENT:  Negative for congestion, ear discharge and nosebleeds.   Eyes:  Negative for blurred vision.  Respiratory:  Negative for cough, hemoptysis, sputum production, shortness of breath and wheezing.   Cardiovascular:  Negative for chest pain, palpitations, orthopnea and claudication.  Gastrointestinal:  Negative for abdominal pain, blood in stool, constipation, diarrhea, heartburn, melena, nausea and vomiting.  Genitourinary:  Negative for dysuria, flank pain, frequency, hematuria  and urgency.  Musculoskeletal:  Negative for back pain, joint pain and myalgias.  Skin:  Negative for rash.  Neurological:  Negative for dizziness, tingling, focal weakness, seizures, weakness and headaches.  Endo/Heme/Allergies:  Does not bruise/bleed easily.  Psychiatric/Behavioral:  Negative for depression and suicidal ideas. The patient does not have insomnia.       Allergies  Allergen Reactions   Codeine Itching   Penicillins Itching     Past Medical History:  Diagnosis Date   Cancer ([place])    CHF (congestive heart failure) (H[facility])    Clotting disorder ([place])    Diabetes mellitus without complication ([place])    Heart disease    Hypertension      Past Surgical History:  Procedure Laterality Date   HERNIA REPAIR     PACEMAKER PLACEMENT  04/11/2020   PERIPHERAL VASCULAR CATHETERIZATION N/A 01/18/2016   Procedure: IVC Filter Insertion;  Surgeon: Katha Cabal, MD;  Location: [place] [facility];  Service: Cardiovascular;  Laterality: N/A;   PERIPHERAL VASCULAR CATHETERIZATION N/A 01/21/2016   Procedure: Glori Luis Cath Insertion;  Surgeon: Algernon Huxley, MD;  Location: [place] [facility];  Service: Cardiovascular;  Laterality: N/A;   PORTA CATH REMOVAL N/A 01/07/2018   Procedure: PORTA CATH REMOVAL;  Surgeon: Algernon Huxley, MD;  Location: [place] [facility];  Service: Cardiovascular;  Laterality: N/A;   TRANSCATHETER AORTIC VALVE REPLACEMENT, TRANSAORTIC  04/09/2020    Social History   Socioeconomic History   Marital status: Widowed    Spouse name: Not on file   Number of children: Not on file   Years of education: Not on file   Highest education level: Not on file  Occupational History   Not on file  Tobacco Use   Smoking status: Former    Packs/day: 1.50    Pack years: 0.00    Types: Cigarettes    Quit date: 01/01/1986    Years since quitting: 34.7   Smokeless tobacco: Former   Tobacco comments:    Smoked for 20 years  Substance and Sexual Activity   Alcohol  use: Never   Drug use: Never   Sexual activity: Not on file  Other Topics Concern   Not on file  Social History Narrative   Not on file   Social Determinants of Health   Financial Resource Strain: Not on file  Food Insecurity: Not on file  Transportation Needs: Not on file  Physical Activity: Not on file  Stress: Not on file  Social Connections: Not on file  Intimate Partner Violence: Not on file    No family history on file.   Current Outpatient Medications:    acetaminophen (TYLENOL) 325 MG tablet, Take 650 mg by mouth every 6 (six) hours as needed., Disp: , Rfl:    allopurinol (ZYLOPRIM) 100 MG tablet, Take 1 tablet (100 mg total) by mouth daily., Disp: 30 tablet, Rfl: 0   allopurinol (ZYLOPRIM) 100 MG tablet, Take 1 tablet by mouth daily., Disp: , Rfl:    amLODipine (NORVASC) 10 MG tablet, Take 10 mg by mouth daily., Disp: , Rfl:    amLODipine (NORVASC) 10 MG tablet, Take 1 tablet by mouth daily., Disp: , Rfl:    apixaban (ELIQUIS) 5 MG TABS tablet, Take 5 mg by mouth 2 (two) times daily., Disp: , Rfl:    atorvastatin (LIPITOR) 40 MG tablet, Take 20 mg by mouth daily., Disp: , Rfl:    carvedilol (COREG) 6.25  MG tablet, Take 6.25 mg by mouth 2 (two) times daily., Disp: , Rfl:    cholecalciferol (VITAMIN D3) 25 MCG (1000 UNIT) tablet, Take 2,000 Units by mouth daily., Disp: , Rfl:    clotrimazole (LOTRIMIN) 1 % cream, Apply topically., Disp: , Rfl:    finasteride (PROSCAR) 5 MG tablet, Take 1 tablet (5 mg total) by mouth daily., Disp: 30 tablet, Rfl: 11   finasteride (PROSCAR) 5 MG tablet, Take 1 tablet by mouth daily., Disp: , Rfl:    Insulin Glargine (LANTUS [place]), Inject 15 Units into the skin at bedtime., Disp: , Rfl:    liraglutide (VICTOZA) 18 MG/3ML SOPN, Inject 1.2 mg into the skin daily., Disp: , Rfl:    lisinopril (ZESTRIL) 5 MG tablet, Take 1 tablet (5 mg total) by mouth daily., Disp: 30 tablet, Rfl: 0   lisinopril (ZESTRIL) 5 MG tablet, Take 1 tablet by mouth daily.,  Disp: , Rfl:    Multiple Vitamins-Minerals (CENTRUM PO), Take 1 tablet by mouth daily., Disp: , Rfl:    tamsulosin (FLOMAX) 0.4 MG CAPS capsule, Take 0.4 mg by mouth., Disp: , Rfl:    torsemide (DEMADEX) 20 MG tablet, Take 20 mg by mouth 2 (two) times daily., Disp: , Rfl:    vitamin B-12 (CYANOCOBALAMIN) 500 MCG tablet, Take 500 mcg by mouth daily., Disp: , Rfl:    furosemide (LASIX) 40 MG tablet, Take 40 mg by mouth daily. Take additional 86m on M, W, F (Patient not taking: Reported on 09/12/2020), Disp: , Rfl:    silodosin (RAPAFLO) 4 MG CAPS capsule, Take 1 capsule (4 mg total) by mouth daily with breakfast. (Patient not taking: No sig reported), Disp: 30 capsule, Rfl: 11 No current facility-administered medications for this visit.  Facility-Administered Medications Ordered in Other Visits:    sodium chloride flush (NS) 0.9 % injection 10 mL, 10 mL, Intravenous, PRN, BCammie Sickle MD, 10 mL at 03/10/16 0949  Physical exam:  Vitals:   09/12/20 0953  BP: 95/72  Pulse: (!) 46  Resp: 18  Temp: (!) 97.5 F (36.4 C)  SpO2: 97%  Weight: 201 lb 13.3 oz (91.6 kg)   Physical Exam Constitutional:      General: He is not in acute distress. Cardiovascular:     Rate and Rhythm: Normal rate and regular rhythm.     Heart sounds: Normal heart sounds.  Pulmonary:     Effort: Pulmonary effort is normal.     Breath sounds: Normal breath sounds.  Abdominal:     General: Bowel sounds are normal.     Palpations: Abdomen is soft.  Musculoskeletal:     Comments: Bilateral leg edema but left lower extremity appears more swollen than the right. chronic  Lymphadenopathy:     Comments: No palpable cervical, supraclavicular, axillary or inguinal adenopathy    Skin:    General: Skin is warm and dry.  Neurological:     Mental Status: He is alert and oriented to person, place, and time.     CMP Latest Ref Rng & Units 09/12/2020  Glucose 70 - 99 mg/dL 111(H)  BUN 8 - 23 mg/dL 57(H)   Creatinine 0.61 - 1.24 mg/dL 2.20(H)  Sodium 135 - 145 mmol/L 139  Potassium 3.5 - 5.1 mmol/L 4.1  Chloride 98 - 111 mmol/L 102  CO2 22 - 32 mmol/L 29  Calcium 8.9 - 10.3 mg/dL 8.9  Total Protein 6.5 - 8.1 g/dL 7.4  Total Bilirubin 0.3 - 1.2 mg/dL 0.8  Alkaline Phos  38 - 126 U/L 53  AST 15 - 41 U/L 17  ALT 0 - 44 U/L 22   CBC Latest Ref Rng & Units 09/12/2020  WBC 4.0 - 10.5 K/uL 6.7  Hemoglobin 13.0 - 17.0 g/dL 12.8(L)  Hematocrit 39.0 - 52.0 % 39.3  Platelets 150 - 400 K/uL 120(L)    No images are attached to the encounter.  DG Chest 2 View  Result Date: 08/27/2020 CLINICAL DATA:  Shortness of breath. EXAM: CHEST - 2 VIEW COMPARISON:  March 07, 2016. FINDINGS: Stable cardiomediastinal silhouette. Status post transcatheter aortic valve repair. Left-sided pacemaker is noted with leads in grossly good position. No pneumothorax or pleural effusion is noted. Mild central pulmonary vascular congestion is noted. Small left pleural effusion is noted with adjacent left basilar atelectasis or infiltrate. Bony thorax is unremarkable. IMPRESSION: Central pulmonary vascular congestion is noted. Small left pleural effusion with adjacent left basilar atelectasis or infiltrate. Aortic Atherosclerosis (ICD10-I70.0). Electronically Signed   By: Marijo Conception M.D.   On: 08/27/2020 15:26   DG Chest Port 1 View  Result Date: 08/29/2020 CLINICAL DATA:  Status post thoracentesis EXAM: PORTABLE CHEST 1 VIEW COMPARISON:  August 27, 2020 FINDINGS: No appreciable pneumothorax. Left pleural effusion smaller after thoracentesis. Small residual left pleural effusion. Mild bibasilar atelectasis. No edema or consolidation. There is cardiomegaly with pulmonary vascularity normal. There is an aortic valve replacement with pacemaker leads attached to the right atrium and right ventricle. There is aortic atherosclerosis. No adenopathy. No bone lesions. IMPRESSION: No pneumothorax. Small residual left pleural effusion.  Bibasilar atelectasis. Stable cardiomegaly with pacemaker leads attached to right atrium and right ventricle and aortic valve replacement. Aortic Atherosclerosis (ICD10-I70.0). Electronically Signed   By: Lowella Grip III M.D.   On: 08/29/2020 16:47   ECHOCARDIOGRAM COMPLETE  Result Date: 08/28/2020    ECHOCARDIOGRAM REPORT   Patient Name:   JAMAS JAQUAY Date of Exam: 08/28/2020 Medical Rec #:  [id number]        Height:       71.0 in Accession #:    [id number]       Weight:       210.4 lb Date of Birth:  06/15/1931        BSA:          2.155 m Patient Age:    19 years         BP:           128/53 mmHg Patient Gender: M                HR:           60 bpm. Exam Location:  [facility] Procedure: 2D Echo, Cardiac Doppler and Color Doppler Indications:     CHF-acute systolic Q33.35  History:         Patient has prior history of Echocardiogram examinations, most                  recent 05/12/2016. CHF; Risk Factors:Hypertension and Diabetes.  Sonographer:     Sherrie Sport RDCS (AE) Referring Phys:  [id number] Gretta Cool PATEL Diagnosing Phys: Yolonda Kida MD  Sonographer Comments: Suboptimal apical window. IMPRESSIONS  1. Left ventricular ejection fraction, by estimation, is 50 to 55%. The left ventricle has low normal function. The left ventricle has no regional wall motion abnormalities. Left ventricular diastolic parameters were normal.  2. Right ventricular systolic function is normal. The right ventricular size is mildly enlarged. Mildly increased right  ventricular wall thickness. There is mildly elevated pulmonary artery systolic pressure.  3. Moderate pleural effusion in the left lateral region.  4. The mitral valve is normal in structure. No evidence of mitral valve regurgitation.  5. The aortic valve is calcified. There is moderate calcification of the aortic valve. There is moderate thickening of the aortic valve. Aortic valve regurgitation is not visualized. Moderate to severe aortic valve stenosis. FINDINGS   Left Ventricle: Left ventricular ejection fraction, by estimation, is 50 to 55%. The left ventricle has low normal function. The left ventricle has no regional wall motion abnormalities. The left ventricular internal cavity size was normal in size. There is no left ventricular hypertrophy. Left ventricular diastolic parameters were normal. Right Ventricle: The right ventricular size is mildly enlarged. Mildly increased right ventricular wall thickness. Right ventricular systolic function is normal. There is mildly elevated pulmonary artery systolic pressure. Left Atrium: Left atrial size was normal in size. Right Atrium: Right atrial size was normal in size. Pericardium: The pericardium was not well visualized. Mitral Valve: The mitral valve is normal in structure. There is mild thickening of the mitral valve leaflet(s). No evidence of mitral valve regurgitation. MV peak gradient, 6.2 mmHg. The mean mitral valve gradient is 2.0 mmHg. Tricuspid Valve: The tricuspid valve is normal in structure. Tricuspid valve regurgitation is mild. Aortic Valve: The aortic valve is calcified. There is moderate calcification of the aortic valve. There is moderate thickening of the aortic valve. Aortic valve regurgitation is not visualized. Moderate to severe aortic stenosis is present. Aortic valve mean gradient measures 8.0 mmHg. Aortic valve peak gradient measures 13.9 mmHg. Aortic valve area, by VTI measures 0.75 cm. Pulmonic Valve: The pulmonic valve was normal in structure. Pulmonic valve regurgitation is not visualized. Aorta: The ascending aorta was not well visualized. IAS/Shunts: No atrial level shunt detected by color flow Doppler. Additional Comments: A device lead is visualized. There is a moderate pleural effusion in the left lateral region.  LEFT VENTRICLE PLAX 2D LVIDd:         5.15 cm  Diastology LVIDs:         3.72 cm  LV e' lateral:   5.44 cm/s LV PW:         1.21 cm  LV E/e' lateral: 24.4 LV IVS:        0.91 cm  LVOT diam:     2.00 cm LV SV:         28 LV SV Index:   13 LVOT Area:     3.14 cm  RIGHT VENTRICLE RV Basal diam:  3.46 cm RV S prime:     6.91 cm/s TAPSE (M-mode): 4.4 cm LEFT ATRIUM             Index       RIGHT ATRIUM           Index LA diam:        5.20 cm 2.41 cm/m  RA Area:     13.90 cm LA Vol (A2C):   92.1 ml 42.75 ml/m RA Volume:   34.20 ml  15.87 ml/m LA Vol (A4C):   86.7 ml 40.24 ml/m LA Biplane Vol: 91.8 ml 42.61 ml/m  AORTIC VALVE                    PULMONIC VALVE AV Area (Vmax):    0.74 cm     PV Vmax:        0.60 m/s AV Area (Vmean):  0.74 cm     PV Peak grad:   1.4 mmHg AV Area (VTI):     0.75 cm     RVOT Peak grad: 2 mmHg AV Vmax:           186.67 cm/s AV Vmean:          130.333 cm/s AV VTI:            0.371 m AV Peak Grad:      13.9 mmHg AV Mean Grad:      8.0 mmHg LVOT Vmax:         43.70 cm/s LVOT Vmean:        30.800 cm/s LVOT VTI:          0.088 m LVOT/AV VTI ratio: 0.24  AORTA Ao Root diam: 3.00 cm MITRAL VALVE                TRICUSPID VALVE MV Area (PHT): 4.96 cm     TR Peak grad:   46.5 mmHg MV Area VTI:   0.79 cm     TR Vmax:        341.00 cm/s MV Peak grad:  6.2 mmHg MV Mean grad:  2.0 mmHg     SHUNTS MV Vmax:       1.25 m/s     Systemic VTI:  0.09 m MV Vmean:      59.6 cm/s    Systemic Diam: 2.00 cm MV Decel Time: 153 msec MV E velocity: 133.00 cm/s Yolonda Kida MD Electronically signed by Yolonda Kida MD Signature Date/Time: 08/28/2020/4:48:34 PM    Final    US THORACENTESIS ASP PLEURAL SPACE W/IMG GUIDE  Result Date: 08/29/2020 INDICATION: 87 year old with shortness of breath and pleural effusions. EXAM: ULTRASOUND GUIDED LEFT THORACENTESIS MEDICATIONS: None. COMPLICATIONS: None immediate. PROCEDURE: An ultrasound guided thoracentesis was thoroughly discussed with the patient and questions answered. The benefits, risks, alternatives and complications were also discussed. The patient understands and wishes to proceed with the procedure. Written consent was  obtained. Ultrasound was performed to localize and mark an adequate pocket of fluid in the left chest. The area was then prepped and draped in the normal sterile fashion. 1% Lidocaine was used for local anesthesia. Under ultrasound guidance a 6 Fr Safe-T-Centesis catheter was introduced. Thoracentesis was performed. The catheter was removed and a dressing applied. FINDINGS: A total of approximately 1850 mL of yellow fluid was removed. Samples were sent to the laboratory as requested by the clinical team. Right pleural effusion. IMPRESSION: Successful ultrasound guided left thoracentesis yielding 1850 mL of pleural fluid. Electronically Signed   By: Markus Daft M.D.   On: 08/29/2020 16:42     Assessment and plan- Patient is a 87 y.o. male with history of diffuse large B-cell lymphoma s/p 6 cycles of R mini CHOP chemotherapy ending in September 2018 here for routine follow-up  Clinically patient is doing well with no concerning signs and symptoms of recurrence based on today's exam.  He does have chronic left lower extremity swelling that is more than the right from prior episodes of thrombosis.  He will continue on Xarelto for that  Patient recently had left thoracentesis for what was presumed to be secondary to heart failure.  Fluid has not reaccumulated.  However if he were to have any episodes of effusion in the future fluid also needs to be sent out for flow cytometry to make sure that this is not a lymphoma recurrence.  It is typically rare  to get a lymphoma recurrence almost 5 years out but remains a possibility.  Patient also knows to get in touch with [place] if he were to have recurrent effusion so that adequate sample sent for testing  I will see him back in 6 months with CBC with differential CMP and LDH.  He has some chronic elevation of uric acid unrelated to his lymphoma and I will therefore not continue checking his uric acid levels.   Visit Diagnosis 1. Encounter for follow-up surveillance of  diffuse large B-cell lymphoma      Dr. Randa Evens, MD, MPH [facility] at [facility] [phone] 09/12/2020 1:07 PM

## 2020-10-09 ENCOUNTER — Ambulatory Visit: Payer: Medicare Other | Admitting: Family

## 2020-11-09 ENCOUNTER — Telehealth: Payer: Self-pay | Admitting: Family

## 2020-11-09 NOTE — Telephone Encounter (Signed)
Patient's previous HF Clinic appointment had been cancelled, per his daughter. No plans to r/s at this time but did tell her that they could call us back to r/s at any time.

## 2021-03-06 ENCOUNTER — Ambulatory Visit: Payer: Self-pay | Admitting: Urology

## 2021-03-13 ENCOUNTER — Ambulatory Visit: Payer: Medicare Other | Admitting: Oncology

## 2021-03-13 ENCOUNTER — Inpatient Hospital Stay: Payer: Medicare Other

## 2021-03-18 ENCOUNTER — Other Ambulatory Visit: Payer: Self-pay

## 2021-03-18 ENCOUNTER — Inpatient Hospital Stay: Payer: Medicare Other | Attending: Oncology

## 2021-03-18 ENCOUNTER — Encounter: Payer: Self-pay | Admitting: Oncology

## 2021-03-18 ENCOUNTER — Inpatient Hospital Stay (HOSPITAL_BASED_OUTPATIENT_CLINIC_OR_DEPARTMENT_OTHER): Payer: Medicare Other | Admitting: Oncology

## 2021-03-18 VITALS — BP 146/40 | HR 35 | Temp 98.2°F | Resp 16 | Ht 71.0 in | Wt 200.4 lb

## 2021-03-18 DIAGNOSIS — Z87891 Personal history of nicotine dependence: Secondary | ICD-10-CM | POA: Insufficient documentation

## 2021-03-18 DIAGNOSIS — Z08 Encounter for follow-up examination after completed treatment for malignant neoplasm: Secondary | ICD-10-CM

## 2021-03-18 DIAGNOSIS — Z86718 Personal history of other venous thrombosis and embolism: Secondary | ICD-10-CM | POA: Insufficient documentation

## 2021-03-18 DIAGNOSIS — E119 Type 2 diabetes mellitus without complications: Secondary | ICD-10-CM | POA: Diagnosis not present

## 2021-03-18 DIAGNOSIS — Z8572 Personal history of non-Hodgkin lymphomas: Secondary | ICD-10-CM | POA: Diagnosis not present

## 2021-03-18 DIAGNOSIS — I110 Hypertensive heart disease with heart failure: Secondary | ICD-10-CM | POA: Diagnosis not present

## 2021-03-18 DIAGNOSIS — I509 Heart failure, unspecified: Secondary | ICD-10-CM | POA: Diagnosis not present

## 2021-03-18 DIAGNOSIS — Z8579 Personal history of other malignant neoplasms of lymphoid, hematopoietic and related tissues: Secondary | ICD-10-CM

## 2021-03-18 DIAGNOSIS — D696 Thrombocytopenia, unspecified: Secondary | ICD-10-CM | POA: Insufficient documentation

## 2021-03-18 LAB — COMPREHENSIVE METABOLIC PANEL
ALT: 21 U/L (ref 0–44)
AST: 18 U/L (ref 15–41)
Albumin: 3.8 g/dL (ref 3.5–5.0)
Alkaline Phosphatase: 52 U/L (ref 38–126)
Anion gap: 7 (ref 5–15)
BUN: 40 mg/dL — ABNORMAL HIGH (ref 8–23)
CO2: 28 mmol/L (ref 22–32)
Calcium: 9.1 mg/dL (ref 8.9–10.3)
Chloride: 104 mmol/L (ref 98–111)
Creatinine, Ser: 1.54 mg/dL — ABNORMAL HIGH (ref 0.61–1.24)
GFR, Estimated: 43 mL/min — ABNORMAL LOW (ref >60)
Glucose, Bld: 123 mg/dL — ABNORMAL HIGH (ref 70–99)
Potassium: 4.1 mmol/L (ref 3.5–5.1)
Sodium: 139 mmol/L (ref 135–145)
Total Bilirubin: 0.8 mg/dL (ref 0.3–1.2)
Total Protein: 6.9 g/dL (ref 6.5–8.1)

## 2021-03-18 LAB — CBC WITH DIFFERENTIAL/PLATELET
Abs Immature Granulocytes: 0.04 10*3/uL (ref 0.00–0.07)
Basophils Absolute: 0.1 10*3/uL (ref 0.0–0.1)
Basophils Relative: 1 %
Eosinophils Absolute: 0.3 10*3/uL (ref 0.0–0.5)
Eosinophils Relative: 3 %
HCT: 41.5 % (ref 39.0–52.0)
Hemoglobin: 13.5 g/dL (ref 13.0–17.0)
Immature Granulocytes: 0 %
Lymphocytes Relative: 18 %
Lymphs Abs: 1.7 10*3/uL (ref 0.7–4.0)
MCH: 31 pg (ref 26.0–34.0)
MCHC: 32.5 g/dL (ref 30.0–36.0)
MCV: 95.2 fL (ref 80.0–100.0)
Monocytes Absolute: 0.7 10*3/uL (ref 0.1–1.0)
Monocytes Relative: 8 %
Neutro Abs: 6.8 10*3/uL (ref 1.7–7.7)
Neutrophils Relative %: 70 %
Platelets: 95 10*3/uL — ABNORMAL LOW (ref 150–400)
RBC: 4.36 MIL/uL (ref 4.22–5.81)
RDW: 15.4 % (ref 11.5–15.5)
WBC: 9.6 10*3/uL (ref 4.0–10.5)
nRBC: 0 % (ref 0.0–0.2)

## 2021-03-18 LAB — LACTATE DEHYDROGENASE: LDH: 177 U/L (ref 98–192)

## 2021-03-18 NOTE — Progress Notes (Signed)
Hematology/Oncology Consult note [facility]  Telephone:([phone] [phone]  Patient Care Team: Kirk Ruths, MD as PCP - General (Internal Medicine)   Name of the patient: Hayden Martin  [id number]  [birth year]   Date of visit: 03/18/21  Diagnosis- -history of diffuse large B-cell lymphoma stage IIIB  in 2017  Chief complaint/ Reason for visit-routine follow-up visit for diffuse large B-cell lymphoma  Heme/Onc history: Patient is a 88 year old male diagnosed with diffuse large B-cell lymphoma in November 2017.  At that time he had presented with B symptoms. CT-guided retroperitoneal lymph node biopsy on 01/21/2016 revealed a CD30 positive large B-cell lymphoma. Immunohistochemical studies were positive for CD20, CD30, BCL-2, BCL 6, Ki-67 (> 90%) and negative for CD10 and cyclin D1.   Bone marrow biopsy on 01/21/2016 revealed no evidence of lymphoma.  Flow cytometry was negative.  Cytogenetics were normal (46, XY).PET scan on 01/16/2016 revealed extensive hypermetabolic adenopathy within the neck, chest, abdomen and pelvis.  The spleen was enlarged with multi focal hypermetabolic splenic lesions  There was evidence of transperitoneal spread of tumor within the upper abdomen.  There were bilateral pleural effusions.   He received 6 cycles of mini-RCHOP (01/23/2016 - 05/16/2016).  Cycle #1 was complicated by tumor lysis syndrome, leukopenia, and symptomatic pleural effusions.  Diagnostic and 1 liter therapeutic left sided thoracentesis on 02/08/2016 revealed involvement with lymphoma.   PET scan on 04/23/2016 revealed an excellent response to treatment. The extensive adenopathy seen on the prior PET-CT showed remarkable improvement.  There was no residual metabolically active lymphoma is identified. There was a moderate left pleural effusion and small right pleural effusion.   PET scan on 06/06/2016 revealed continued improvement and near  resolution. There was only minimal retroperitoneal nodularity/adenopathy, much of which likely represents a residuum from previously treated lymphoma.  The residual periaortic lymph node measured 1.2 cm in short axis with maximum SUV 2.2 (formerly 1.4 cm and 3.6, respectively). The minimal residual nodal activity was Deauville 3.   He has a history of left lower extremity thrombosis.  He has had 2 clots 1 year apart and is on life long anticoagulation.  Bilateral lower extremity duplex on 01/16/2016 revealed DVT in the left lower extremity involving the calf veins and the left popliteal vein.  IVC filter was placed on 01/18/2016.  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  There was interval resolution of the left calf vein DVT.  He is on Xarelto.    Interval history-patient is doing well for his age.  He is independent of his ADLs.  Appetite is fair and patient denies any unintentional weight loss.  ECOG PS- 1 Pain scale- 0   Review of systems- Review of Systems  Constitutional:  Negative for chills, fever, malaise/fatigue and weight loss.  HENT:  Negative for congestion, ear discharge and nosebleeds.   Eyes:  Negative for blurred vision.  Respiratory:  Negative for cough, hemoptysis, sputum production, shortness of breath and wheezing.   Cardiovascular:  Negative for chest pain, palpitations, orthopnea and claudication.  Gastrointestinal:  Negative for abdominal pain, blood in stool, constipation, diarrhea, heartburn, melena, nausea and vomiting.  Genitourinary:  Negative for dysuria, flank pain, frequency, hematuria and urgency.  Musculoskeletal:  Negative for back pain, joint pain and myalgias.  Skin:  Negative for rash.  Neurological:  Negative for dizziness, tingling, focal weakness, seizures, weakness and headaches.  Endo/Heme/Allergies:  Does not bruise/bleed easily.   Psychiatric/Behavioral:  Negative for depression and suicidal ideas. The patient does not have insomnia.       Allergies  Allergen Reactions   Codeine Itching   Penicillins Itching     Past Medical History:  Diagnosis Date   Cancer ([place])    CHF (congestive heart failure) (HCC)    Clotting disorder ([place])    Diabetes mellitus without complication ([place])    Heart disease    Hypertension      Past Surgical History:  Procedure Laterality Date   HERNIA REPAIR     PACEMAKER PLACEMENT  04/11/2020   PERIPHERAL VASCULAR CATHETERIZATION N/A 01/18/2016   Procedure: IVC Filter Insertion;  Surgeon: Katha Cabal, MD;  Location: [facility];  Service: Cardiovascular;  Laterality: N/A;   PERIPHERAL VASCULAR CATHETERIZATION N/A 01/21/2016   Procedure: Glori Luis Cath Insertion;  Surgeon: Algernon Huxley, MD;  Location: [facility];  Service: Cardiovascular;  Laterality: N/A;   PORTA CATH REMOVAL N/A 01/07/2018   Procedure: PORTA CATH REMOVAL;  Surgeon: Algernon Huxley, MD;  Location: [facility];  Service: Cardiovascular;  Laterality: N/A;   TRANSCATHETER AORTIC VALVE REPLACEMENT, TRANSAORTIC  04/09/2020    Social History   Socioeconomic History   Marital status: Widowed    Spouse name: Not on file   Number of children: Not on file   Years of education: Not on file   Highest education level: Not on file  Occupational History   Not on file  Tobacco Use   Smoking status: Former    Packs/day: 1.50    Types: Cigarettes    Quit date: 01/01/1986    Years since quitting: 35.2   Smokeless tobacco: Former   Tobacco comments:    Smoked for 20 years  Substance and Sexual Activity   Alcohol use: Never   Drug use: Never   Sexual activity: Not on file  Other Topics Concern   Not on file  Social History Narrative   Not on file   Social Determinants of Health   Financial Resource Strain: Not on file  Food Insecurity: Not on file  Transportation Needs: Not on file   Physical Activity: Not on file  Stress: Not on file  Social Connections: Not on file  Intimate Partner Violence: Not on file    History reviewed. No pertinent family history.   Current Outpatient Medications:    acetaminophen (TYLENOL) 325 MG tablet, Take 650 mg by mouth every 6 (six) hours as needed., Disp: , Rfl:    allopurinol (ZYLOPRIM) 100 MG tablet, Take 1 tablet (100 mg total) by mouth daily., Disp: 30 tablet, Rfl: 0   amLODipine (NORVASC) 10 MG tablet, Take 10 mg by mouth daily., Disp: , Rfl:    amLODipine (NORVASC) 10 MG tablet, Take 1 tablet by mouth daily., Disp: , Rfl:    apixaban (ELIQUIS) 5 MG TABS tablet, Take 5 mg by mouth 2 (two) times daily., Disp: , Rfl:    atorvastatin (LIPITOR) 40 MG tablet, Take 20 mg by mouth daily., Disp: , Rfl:    carvedilol (COREG) 6.25 MG tablet, Take 6.25 mg by mouth 2 (two) times daily., Disp: , Rfl:    cholecalciferol (VITAMIN D3) 25 MCG (1000 UNIT) tablet, Take 2,000 Units by mouth daily., Disp: , Rfl:    clotrimazole (LOTRIMIN) 1 % cream, Apply topically., Disp: , Rfl:    finasteride (PROSCAR) 5 MG tablet, Take 1 tablet (5 mg total) by  mouth daily., Disp: 30 tablet, Rfl: 11   finasteride (PROSCAR) 5 MG tablet, Take 1 tablet by mouth daily., Disp: , Rfl:    Insulin Glargine (LANTUS [place]), Inject 15 Units into the skin at bedtime., Disp: , Rfl:    liraglutide (VICTOZA) 18 MG/3ML SOPN, Inject 1.2 mg into the skin daily., Disp: , Rfl:    lisinopril (ZESTRIL) 5 MG tablet, Take 1 tablet (5 mg total) by mouth daily., Disp: 30 tablet, Rfl: 0   lisinopril (ZESTRIL) 5 MG tablet, Take 1 tablet by mouth daily., Disp: , Rfl:    Multiple Vitamins-Minerals (CENTRUM PO), Take 1 tablet by mouth daily., Disp: , Rfl:    tamsulosin (FLOMAX) 0.4 MG CAPS capsule, Take 0.4 mg by mouth., Disp: , Rfl:    torsemide (DEMADEX) 20 MG tablet, Take 20 mg by mouth 2 (two) times daily., Disp: , Rfl:    vitamin B-12 (CYANOCOBALAMIN) 500 MCG tablet, Take 500 mcg by mouth  daily., Disp: , Rfl:    furosemide (LASIX) 40 MG tablet, Take 40 mg by mouth daily. Take additional 42m on M, W, F (Patient not taking: Reported on 09/12/2020), Disp: , Rfl:    silodosin (RAPAFLO) 4 MG CAPS capsule, Take 1 capsule (4 mg total) by mouth daily with breakfast. (Patient not taking: Reported on 09/04/2020), Disp: 30 capsule, Rfl: 11 No current facility-administered medications for this visit.  Facility-Administered Medications Ordered in Other Visits:    sodium chloride flush (NS) 0.9 % injection 10 mL, 10 mL, Intravenous, PRN, BCammie Sickle MD, 10 mL at 03/10/16 0949  Physical exam:  Vitals:   03/18/21 1017  BP: (!) 146/40  Pulse: (!) 35  Resp: 16  Temp: 98.2 F (36.8 C)  TempSrc: Tympanic  SpO2: 99%  Weight: 200 lb 6.4 oz (90.9 kg)  Height: 5' 11"  (1.803 m)   Physical Exam Constitutional:      General: He is not in acute distress. Cardiovascular:     Rate and Rhythm: Normal rate and regular rhythm.     Heart sounds: Normal heart sounds.  Pulmonary:     Effort: Pulmonary effort is normal.     Breath sounds: Normal breath sounds.  Abdominal:     General: Bowel sounds are normal.     Palpations: Abdomen is soft.  Lymphadenopathy:     Comments: No palpable cervical, supraclavicular, axillary or inguinal adenopathy    Skin:    General: Skin is warm and dry.  Neurological:     Mental Status: He is alert and oriented to person, place, and time.     CMP Latest Ref Rng & Units 03/18/2021  Glucose 70 - 99 mg/dL 123(H)  BUN 8 - 23 mg/dL 40(H)  Creatinine 0.61 - 1.24 mg/dL 1.54(H)  Sodium 135 - 145 mmol/L 139  Potassium 3.5 - 5.1 mmol/L 4.1  Chloride 98 - 111 mmol/L 104  CO2 22 - 32 mmol/L 28  Calcium 8.9 - 10.3 mg/dL 9.1  Total Protein 6.5 - 8.1 g/dL 6.9  Total Bilirubin 0.3 - 1.2 mg/dL 0.8  Alkaline Phos 38 - 126 U/L 52  AST 15 - 41 U/L 18  ALT 0 - 44 U/L 21   CBC Latest Ref Rng & Units 03/18/2021  WBC 4.0 - 10.5 K/uL 9.6  Hemoglobin 13.0 - 17.0  g/dL 13.5  Hematocrit 39.0 - 52.0 % 41.5  Platelets 150 - 400 K/uL 95(L)     Assessment and plan- Patient is a 88y.o. male with history of diffuse large  B-cell lymphoma s/p 6 cycles of R mini CHOP chemotherapy ending in March 2018.  This is a routine follow-up visit  This year marks the end of 5 years since his diagnosis of diffuse large B-cell lymphoma.  Clinically he is doing well with no concerning signs and symptoms of recurrence based on today's exam.He completed all treatment in March 2018.  I will therefore see him back in 1 years time with CBC with differential CMP and LDH   Patient has mild thrombocytopenia which we will monitor with an interim CBC in 6 months time   Visit Diagnosis 1. Encounter for follow-up surveillance of diffuse large B-cell lymphoma      Dr. Randa Evens, MD, MPH [facility] at [facility] [phone] 03/18/2021 12:46 PM

## 2021-09-16 ENCOUNTER — Other Ambulatory Visit: Payer: Medicare Other

## 2021-09-18 ENCOUNTER — Inpatient Hospital Stay: Payer: Medicare Other | Attending: Oncology

## 2021-09-18 DIAGNOSIS — Z08 Encounter for follow-up examination after completed treatment for malignant neoplasm: Secondary | ICD-10-CM

## 2021-09-18 DIAGNOSIS — Z8572 Personal history of non-Hodgkin lymphomas: Secondary | ICD-10-CM | POA: Diagnosis not present

## 2021-09-18 LAB — CBC WITH DIFFERENTIAL/PLATELET
Abs Immature Granulocytes: 0.06 10*3/uL (ref 0.00–0.07)
Basophils Absolute: 0.1 10*3/uL (ref 0.0–0.1)
Basophils Relative: 1 %
Eosinophils Absolute: 0.2 10*3/uL (ref 0.0–0.5)
Eosinophils Relative: 2 %
HCT: 39.3 % (ref 39.0–52.0)
Hemoglobin: 12.6 g/dL — ABNORMAL LOW (ref 13.0–17.0)
Immature Granulocytes: 1 %
Lymphocytes Relative: 23 %
Lymphs Abs: 2 10*3/uL (ref 0.7–4.0)
MCH: 31.2 pg (ref 26.0–34.0)
MCHC: 32.1 g/dL (ref 30.0–36.0)
MCV: 97.3 fL (ref 80.0–100.0)
Monocytes Absolute: 0.5 10*3/uL (ref 0.1–1.0)
Monocytes Relative: 6 %
Neutro Abs: 5.6 10*3/uL (ref 1.7–7.7)
Neutrophils Relative %: 67 %
Platelets: 138 10*3/uL — ABNORMAL LOW (ref 150–400)
RBC: 4.04 MIL/uL — ABNORMAL LOW (ref 4.22–5.81)
RDW: 15 % (ref 11.5–15.5)
WBC: 8.4 10*3/uL (ref 4.0–10.5)
nRBC: 0 % (ref 0.0–0.2)

## 2022-03-19 ENCOUNTER — Ambulatory Visit: Payer: Medicare Other | Admitting: Oncology

## 2022-03-19 ENCOUNTER — Other Ambulatory Visit: Payer: Medicare Other

## 2022-04-28 ENCOUNTER — Other Ambulatory Visit: Payer: Self-pay

## 2022-04-28 DIAGNOSIS — C8333 Diffuse large B-cell lymphoma, intra-abdominal lymph nodes: Secondary | ICD-10-CM

## 2022-04-29 ENCOUNTER — Inpatient Hospital Stay: Payer: Medicare Other | Attending: Oncology | Admitting: Oncology

## 2022-04-29 ENCOUNTER — Encounter: Payer: Self-pay | Admitting: Oncology

## 2022-04-29 ENCOUNTER — Inpatient Hospital Stay: Payer: Medicare Other

## 2022-04-29 VITALS — BP 152/52 | HR 60 | Temp 95.3°F | Resp 18 | Ht 71.0 in | Wt 207.3 lb

## 2022-04-29 DIAGNOSIS — Z87891 Personal history of nicotine dependence: Secondary | ICD-10-CM | POA: Diagnosis not present

## 2022-04-29 DIAGNOSIS — I110 Hypertensive heart disease with heart failure: Secondary | ICD-10-CM | POA: Diagnosis not present

## 2022-04-29 DIAGNOSIS — Z950 Presence of cardiac pacemaker: Secondary | ICD-10-CM | POA: Diagnosis not present

## 2022-04-29 DIAGNOSIS — Z86718 Personal history of other venous thrombosis and embolism: Secondary | ICD-10-CM | POA: Diagnosis not present

## 2022-04-29 DIAGNOSIS — I509 Heart failure, unspecified: Secondary | ICD-10-CM | POA: Diagnosis not present

## 2022-04-29 DIAGNOSIS — Z08 Encounter for follow-up examination after completed treatment for malignant neoplasm: Secondary | ICD-10-CM

## 2022-04-29 DIAGNOSIS — Z7901 Long term (current) use of anticoagulants: Secondary | ICD-10-CM | POA: Insufficient documentation

## 2022-04-29 DIAGNOSIS — Z8579 Personal history of other malignant neoplasms of lymphoid, hematopoietic and related tissues: Secondary | ICD-10-CM | POA: Diagnosis not present

## 2022-04-29 DIAGNOSIS — E119 Type 2 diabetes mellitus without complications: Secondary | ICD-10-CM | POA: Insufficient documentation

## 2022-04-29 DIAGNOSIS — Z8572 Personal history of non-Hodgkin lymphomas: Secondary | ICD-10-CM | POA: Insufficient documentation

## 2022-04-29 DIAGNOSIS — C8333 Diffuse large B-cell lymphoma, intra-abdominal lymph nodes: Secondary | ICD-10-CM

## 2022-04-29 LAB — CMP (CANCER CENTER ONLY)
ALT: 14 U/L (ref 0–44)
AST: 13 U/L — ABNORMAL LOW (ref 15–41)
Albumin: 3.8 g/dL (ref 3.5–5.0)
Alkaline Phosphatase: 57 U/L (ref 38–126)
Anion gap: 7 (ref 5–15)
BUN: 34 mg/dL — ABNORMAL HIGH (ref 8–23)
CO2: 30 mmol/L (ref 22–32)
Calcium: 9 mg/dL (ref 8.9–10.3)
Chloride: 103 mmol/L (ref 98–111)
Creatinine: 1.57 mg/dL — ABNORMAL HIGH (ref 0.61–1.24)
GFR, Estimated: 42 mL/min — ABNORMAL LOW (ref >60)
Glucose, Bld: 101 mg/dL — ABNORMAL HIGH (ref 70–99)
Potassium: 4.3 mmol/L (ref 3.5–5.1)
Sodium: 140 mmol/L (ref 135–145)
Total Bilirubin: 0.8 mg/dL (ref 0.3–1.2)
Total Protein: 6.9 g/dL (ref 6.5–8.1)

## 2022-04-29 LAB — CBC WITH DIFFERENTIAL (CANCER CENTER ONLY)
Abs Immature Granulocytes: 0.02 10*3/uL (ref 0.00–0.07)
Basophils Absolute: 0.1 10*3/uL (ref 0.0–0.1)
Basophils Relative: 1 %
Eosinophils Absolute: 0.2 10*3/uL (ref 0.0–0.5)
Eosinophils Relative: 3 %
HCT: 41.2 % (ref 39.0–52.0)
Hemoglobin: 12.7 g/dL — ABNORMAL LOW (ref 13.0–17.0)
Immature Granulocytes: 0 %
Lymphocytes Relative: 35 %
Lymphs Abs: 2.1 10*3/uL (ref 0.7–4.0)
MCH: 30.2 pg (ref 26.0–34.0)
MCHC: 30.8 g/dL (ref 30.0–36.0)
MCV: 97.9 fL (ref 80.0–100.0)
Monocytes Absolute: 0.5 10*3/uL (ref 0.1–1.0)
Monocytes Relative: 9 %
Neutro Abs: 3.2 10*3/uL (ref 1.7–7.7)
Neutrophils Relative %: 52 %
Platelet Count: 124 10*3/uL — ABNORMAL LOW (ref 150–400)
RBC: 4.21 MIL/uL — ABNORMAL LOW (ref 4.22–5.81)
RDW: 14.8 % (ref 11.5–15.5)
WBC Count: 6.2 10*3/uL (ref 4.0–10.5)
nRBC: 0 % (ref 0.0–0.2)

## 2022-04-29 LAB — LACTATE DEHYDROGENASE: LDH: 169 U/L (ref 98–192)

## 2022-04-29 NOTE — Progress Notes (Signed)
No concerns for the provider.

## 2022-04-29 NOTE — Progress Notes (Signed)
Hematology/Oncology Consult note [facility]  [phone] [phone]  Patient Care Team: Kirk Ruths, MD as PCP - General (Internal Medicine) Sindy Guadeloupe, MD as Consulting Physician (Oncology)   Name of the patient: Hayden Martin  [id number]  03-18-1931   Date of visit: 04/29/22  Diagnosis- history of diffuse large B-cell lymphoma stage IIIB  in 2017  Chief complaint/ Reason for visit-routine follow-up of lymphoma  Heme/Onc history: Patient is a 89 year old male diagnosed with diffuse large B-cell lymphoma in November 2017.  At that time he had presented with B symptoms. CT-guided retroperitoneal lymph node biopsy on 01/21/2016 revealed a CD30 positive large B-cell lymphoma. Immunohistochemical studies were positive for CD20, CD30, BCL-2, BCL 6, Ki-67 (> 90%) and negative for CD10 and cyclin D1.   Bone marrow biopsy on 01/21/2016 revealed no evidence of lymphoma.  Flow cytometry was negative.  Cytogenetics were normal (46, XY).PET scan on 01/16/2016 revealed extensive hypermetabolic adenopathy within the neck, chest, abdomen and pelvis.  The spleen was enlarged with multi focal hypermetabolic splenic lesions  There was evidence of transperitoneal spread of tumor within the upper abdomen.  There were bilateral pleural effusions.   He received 6 cycles of mini-RCHOP (01/23/2016 - 05/16/2016).  Cycle #1 was complicated by tumor lysis syndrome, leukopenia, and symptomatic pleural effusions.  Diagnostic and 1 liter therapeutic left sided thoracentesis on 02/08/2016 revealed involvement with lymphoma.   PET scan on 04/23/2016 revealed an excellent response to treatment. The extensive adenopathy seen on the prior PET-CT showed remarkable improvement.  There was no residual metabolically active lymphoma is identified. There was a moderate left pleural effusion and small right pleural effusion.   PET scan on 06/06/2016 revealed continued  improvement and near resolution. There was only minimal retroperitoneal nodularity/adenopathy, much of which likely represents a residuum from previously treated lymphoma.  The residual periaortic lymph node measured 1.2 cm in short axis with maximum SUV 2.2 (formerly 1.4 cm and 3.6, respectively). The minimal residual nodal activity was Deauville 3.   He has a history of left lower extremity thrombosis.  He has had 2 clots 1 year apart and is on life long anticoagulation.  Bilateral lower extremity duplex on 01/16/2016 revealed DVT in the left lower extremity involving the calf veins and the left popliteal vein.  IVC filter was placed on 01/18/2016.  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  There was interval resolution of the left calf vein DVT.  He is on Xarelto.    Interval history-patient was recently started on home oxygen about 2 weeks ago when he was found to have low oxygen saturations at home.  Appetite and weight have remained stable.  Denies any B symptoms  ECOG PS- 2 Pain scale- 0   Review of systems- Review of Systems  Constitutional:  Positive for malaise/fatigue. Negative for chills, fever and weight loss.  HENT:  Negative for congestion, ear discharge and nosebleeds.   Eyes:  Negative for blurred vision.  Respiratory:  Positive for shortness of breath. Negative for cough, hemoptysis, sputum production and wheezing.   Cardiovascular:  Negative for chest pain, palpitations, orthopnea and claudication.  Gastrointestinal:  Negative for abdominal pain, blood in stool, constipation, diarrhea, heartburn, melena, nausea and vomiting.  Genitourinary:  Negative for dysuria, flank pain, frequency, hematuria and urgency.  Musculoskeletal:  Negative for back pain, joint pain and myalgias.  Skin:  Negative for rash.  Neurological:  Negative for dizziness, tingling, focal  weakness, seizures, weakness and headaches.  Endo/Heme/Allergies:  Does not bruise/bleed easily.  Psychiatric/Behavioral:  Negative for depression and suicidal ideas. The patient does not have insomnia.       Allergies  Allergen Reactions   Codeine Itching   Penicillins Itching     Past Medical History:  Diagnosis Date   Cancer ([place])    CHF (congestive heart failure) (HCC)    Clotting disorder ([place])    Diabetes mellitus without complication ([place])    Heart disease    Hypertension      Past Surgical History:  Procedure Laterality Date   HERNIA REPAIR     PACEMAKER PLACEMENT  04/11/2020   PERIPHERAL VASCULAR CATHETERIZATION N/A 01/18/2016   Procedure: IVC Filter Insertion;  Surgeon: Katha Cabal, MD;  Location: [place];  Service: Cardiovascular;  Laterality: N/A;   PERIPHERAL VASCULAR CATHETERIZATION N/A 01/21/2016   Procedure: Glori Luis Cath Insertion;  Surgeon: Algernon Huxley, MD;  Location: [place] [facility];  Service: Cardiovascular;  Laterality: N/A;   PORTA CATH REMOVAL N/A 01/07/2018   Procedure: PORTA CATH REMOVAL;  Surgeon: Algernon Huxley, MD;  Location: [place];  Service: Cardiovascular;  Laterality: N/A;   TRANSCATHETER AORTIC VALVE REPLACEMENT, TRANSAORTIC  04/09/2020    Social History   Socioeconomic History   Marital status: Widowed    Spouse name: Not on file   Number of children: Not on file   Years of education: Not on file   Highest education level: Not on file  Occupational History   Not on file  Tobacco Use   Smoking status: Former    Packs/day: 1.50    Types: Cigarettes    Quit date: 01/01/1986    Years since quitting: 36.3   Smokeless tobacco: Former   Tobacco comments:    Smoked for 20 years  Substance and Sexual Activity   Alcohol use: Never   Drug use: Never   Sexual activity: Not on file  Other Topics Concern   Not on file  Social History Narrative   Not on file   Social Determinants of Health    Financial Resource Strain: Not on file  Food Insecurity: Not on file  Transportation Needs: Not on file  Physical Activity: Not on file  Stress: Not on file  Social Connections: Not on file  Intimate Partner Violence: Not on file    History reviewed. No pertinent family history.   Current Outpatient Medications:    acetaminophen (TYLENOL) 325 MG tablet, Take 650 mg by mouth every 6 (six) hours as needed., Disp: , Rfl:    allopurinol (ZYLOPRIM) 100 MG tablet, Take 1 tablet (100 mg total) by mouth daily., Disp: 30 tablet, Rfl: 0   amLODipine (NORVASC) 10 MG tablet, Take 10 mg by mouth daily., Disp: , Rfl:    amLODipine (NORVASC) 10 MG tablet, Take 1 tablet by mouth daily., Disp: , Rfl:    apixaban (ELIQUIS) 5 MG TABS tablet, Take 5 mg by mouth 2 (two) times daily., Disp: , Rfl:    atorvastatin (LIPITOR) 40 MG tablet, Take 20 mg by mouth daily., Disp: , Rfl:    azithromycin (ZITHROMAX) 250 MG tablet, Take 250 mg by mouth as directed., Disp: , Rfl:    carvedilol (COREG) 6.25 MG tablet, Take 6.25 mg by mouth 2 (two) times daily., Disp: , Rfl:    cholecalciferol (VITAMIN D3) 25 MCG (1000 UNIT) tablet, Take 2,000  Units by mouth daily., Disp: , Rfl:    clotrimazole (LOTRIMIN) 1 % cream, Apply topically., Disp: , Rfl:    doxycycline (VIBRAMYCIN) 100 MG capsule, Take 100 mg by mouth 2 (two) times daily., Disp: , Rfl:    finasteride (PROSCAR) 5 MG tablet, Take 1 tablet (5 mg total) by mouth daily., Disp: 30 tablet, Rfl: 11   finasteride (PROSCAR) 5 MG tablet, Take 1 tablet by mouth daily., Disp: , Rfl:    furosemide (LASIX) 40 MG tablet, Take 40 mg by mouth daily. Take additional '40mg'$  on M, W, F, Disp: , Rfl:    Insulin Glargine (LANTUS [place]), Inject 15 Units into the skin at bedtime., Disp: , Rfl:    liraglutide (VICTOZA) 18 MG/3ML SOPN, Inject 1.2 mg into the skin daily., Disp: , Rfl:    lisinopril (ZESTRIL) 5 MG tablet, Take 1 tablet (5 mg total) by mouth daily., Disp: 30 tablet, Rfl: 0    lisinopril (ZESTRIL) 5 MG tablet, Take 1 tablet by mouth daily., Disp: , Rfl:    Multiple Vitamins-Minerals (CENTRUM PO), Take 1 tablet by mouth daily., Disp: , Rfl:    silodosin (RAPAFLO) 4 MG CAPS capsule, Take 1 capsule (4 mg total) by mouth daily with breakfast., Disp: 30 capsule, Rfl: 11   tamsulosin (FLOMAX) 0.4 MG CAPS capsule, Take 0.4 mg by mouth., Disp: , Rfl:    torsemide (DEMADEX) 20 MG tablet, Take 20 mg by mouth 2 (two) times daily., Disp: , Rfl:    vitamin B-12 (CYANOCOBALAMIN) 500 MCG tablet, Take 500 mcg by mouth daily., Disp: , Rfl:  No current facility-administered medications for this visit.  Facility-Administered Medications Ordered in Other Visits:    sodium chloride flush (NS) 0.9 % injection 10 mL, 10 mL, Intravenous, PRN, Cammie Sickle, MD, 10 mL at 03/10/16 0949  Physical exam:  Vitals:   04/29/22 1006  BP: (!) 152/52  Pulse: 60  Resp: 18  Temp: (!) 95.3 F (35.2 C)  TempSrc: Tympanic  SpO2: 98%  Weight: 207 lb 4.8 oz (94 kg)  Height: '5\' 11"'$  (1.803 m)   Physical Exam Constitutional:      Comments: Elderly frail patient on home oxygen  Cardiovascular:     Rate and Rhythm: Normal rate and regular rhythm.     Heart sounds: Normal heart sounds.  Pulmonary:     Effort: Pulmonary effort is normal.     Breath sounds: Normal breath sounds.  Abdominal:     General: Bowel sounds are normal.     Palpations: Abdomen is soft.     Comments: No palpable hepatosplenomegaly  Lymphadenopathy:     Comments: No palpable cervical, supraclavicular, axillary or inguinal adenopathy    Skin:    General: Skin is warm and dry.  Neurological:     Mental Status: He is alert and oriented to person, place, and time.         Latest Ref Rng & Units 04/29/2022    9:38 AM  CMP  Glucose 70 - 99 mg/dL 101   BUN 8 - 23 mg/dL 34   Creatinine 0.61 - 1.24 mg/dL 1.57   Sodium 135 - 145 mmol/L 140   Potassium 3.5 - 5.1 mmol/L 4.3   Chloride 98 - 111 mmol/L 103   CO2 22  - 32 mmol/L 30   Calcium 8.9 - 10.3 mg/dL 9.0   Total Protein 6.5 - 8.1 g/dL 6.9   Total Bilirubin 0.3 - 1.2 mg/dL 0.8   Alkaline Phos 38 -  126 U/L 57   AST 15 - 41 U/L 13   ALT 0 - 44 U/L 14       Latest Ref Rng & Units 04/29/2022    9:38 AM  CBC  WBC 4.0 - 10.5 K/uL 6.2   Hemoglobin 13.0 - 17.0 g/dL 12.7   Hematocrit 39.0 - 52.0 % 41.2   Platelets 150 - 400 K/uL 124     Assessment and plan- Patient is a 89 y.o. male with history of diffuse large B-cell lymphoma s/p 6 cycles of R mini CHOP chemotherapy ending in March 2018.  He is here for routine follow-up visit  Clinically patient is doing well with no concerning signs and symptoms of recurrence based on today's exam.  This is year 6 of surveillance.  Given his age and frailty as well as other comorbidities I think it would be okay for him to continue to follow-up with his primary care doctor at this time.  No indication for surveillance scans at this time and he would just need a yearly physical exam through his primary care doctor.  He can be referred to [place] in the future if questions or concerns arise.   Visit Diagnosis 1. Encounter for follow-up surveillance of diffuse large B-cell lymphoma      Dr. Randa Evens, MD, MPH [facility] at [facility] [id number] 04/29/2022 3:53 PM

## 2022-07-04 ENCOUNTER — Encounter: Payer: Self-pay | Admitting: Oncology

## 2022-07-04 ENCOUNTER — Inpatient Hospital Stay: Payer: Medicare Other | Attending: Oncology | Admitting: Oncology

## 2022-07-04 VITALS — BP 114/52 | HR 61 | Temp 97.4°F | Resp 18 | Ht 71.0 in | Wt 193.4 lb

## 2022-07-04 DIAGNOSIS — Z08 Encounter for follow-up examination after completed treatment for malignant neoplasm: Secondary | ICD-10-CM | POA: Diagnosis not present

## 2022-07-04 DIAGNOSIS — Z87891 Personal history of nicotine dependence: Secondary | ICD-10-CM | POA: Diagnosis not present

## 2022-07-04 DIAGNOSIS — Z8572 Personal history of non-Hodgkin lymphomas: Secondary | ICD-10-CM | POA: Insufficient documentation

## 2022-07-04 DIAGNOSIS — Z8579 Personal history of other malignant neoplasms of lymphoid, hematopoietic and related tissues: Secondary | ICD-10-CM

## 2022-07-04 DIAGNOSIS — R634 Abnormal weight loss: Secondary | ICD-10-CM | POA: Insufficient documentation

## 2022-07-06 NOTE — Progress Notes (Signed)
Hematology/Oncology Consult note [facility]  [phone] Fax:([phone]  Patient Care Team: Lauro Regulus, MD as PCP - General (Internal Medicine) Creig Hines, MD as Consulting Physician (Oncology)   Name of the patient: Hayden Martin  [id number]  1931-06-05   Date of visit: 07/06/22  Diagnosis-history of diffuse large B-cell lymphoma now referred for unintentional weight loss  Chief complaint/ Reason for visit-referred for unintentional weight loss  Heme/Onc history:  Patient is a 89 year old male diagnosed with diffuse large B-cell lymphoma in November 2017.  At that time he had presented with B symptoms. CT-guided retroperitoneal lymph node biopsy on 01/21/2016 revealed a CD30 positive large B-cell lymphoma. Immunohistochemical studies were positive for CD20, CD30, BCL-2, BCL 6, Ki-67 (> 90%) and negative for CD10 and cyclin D1.   Bone marrow biopsy on 01/21/2016 revealed no evidence of lymphoma.  Flow cytometry was negative.  Cytogenetics were normal (46, XY).PET scan on 01/16/2016 revealed extensive hypermetabolic adenopathy within the neck, chest, abdomen and pelvis.  The spleen was enlarged with multi focal hypermetabolic splenic lesions  There was evidence of transperitoneal spread of tumor within the upper abdomen.  There were bilateral pleural effusions.   He received 6 cycles of mini-RCHOP (01/23/2016 - 05/16/2016).  Cycle #1 was complicated by tumor lysis syndrome, leukopenia, and symptomatic pleural effusions.  Diagnostic and 1 liter therapeutic left sided thoracentesis on 02/08/2016 revealed involvement with lymphoma.   PET scan on 04/23/2016 revealed an excellent response to treatment. The extensive adenopathy seen on the prior PET-CT showed remarkable improvement.  There was no residual metabolically active lymphoma is identified. There was a moderate left pleural effusion and small right pleural effusion.   PET scan on  06/06/2016 revealed continued improvement and near resolution. There was only minimal retroperitoneal nodularity/adenopathy, much of which likely represents a residuum from previously treated lymphoma.  The residual periaortic lymph node measured 1.2 cm in short axis with maximum SUV 2.2 (formerly 1.4 cm and 3.6, respectively). The minimal residual nodal activity was Deauville 3.   He has a history of left lower extremity thrombosis.  He has had 2 clots 1 year apart and is on life long anticoagulation.  Bilateral lower extremity duplex on 01/16/2016 revealed DVT in the left lower extremity involving the calf veins and the left popliteal vein.  IVC filter was placed on 01/18/2016.  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  There was interval resolution of the left calf vein DVT.  He is on eliquis  Interval history-patient states that his appetite was down for the last 2 to 3 months but it is gradually picking up. His weight is down to 193 pounds as compared to 207 pounds in February 2024.  He feels well overall.  He has not had any drenching night sweats.  ECOG PS- 2 Pain scale- 0   Review of systems- Review of Systems  Constitutional:  Positive for malaise/fatigue and weight loss. Negative for chills and fever.  HENT:  Negative for congestion, ear discharge and nosebleeds.   Eyes:  Negative for blurred vision.  Respiratory:  Negative for cough, hemoptysis, sputum production, shortness of breath and wheezing.   Cardiovascular:  Negative for chest pain, palpitations, orthopnea and claudication.  Gastrointestinal:  Negative for abdominal pain, blood in stool, constipation, diarrhea, heartburn, melena, nausea and vomiting.  Genitourinary:  Negative for dysuria, flank pain, frequency, hematuria and urgency.  Musculoskeletal:  Negative for back pain, joint pain and myalgias.  Skin:   Negative for rash.  Neurological:  Negative for dizziness, tingling, focal weakness, seizures, weakness and headaches.  Endo/Heme/Allergies:  Does not bruise/bleed easily.  Psychiatric/Behavioral:  Negative for depression and suicidal ideas. The patient does not have insomnia.       Allergies  Allergen Reactions   Codeine Itching   Penicillins Itching     Past Medical History:  Diagnosis Date   Cancer (HCC)    CHF (congestive heart failure) (HCC)    Clotting disorder (HCC)    Diabetes mellitus without complication (HCC)    Heart disease    Hypertension      Past Surgical History:  Procedure Laterality Date   HERNIA REPAIR     PACEMAKER PLACEMENT  04/11/2020   PERIPHERAL VASCULAR CATHETERIZATION N/A 01/18/2016   Procedure: IVC Filter Insertion;  Surgeon: Renford Dills, MD;  Location: [facility] INVASIVE CV LAB;  Service: Cardiovascular;  Laterality: N/A;   PERIPHERAL VASCULAR CATHETERIZATION N/A 01/21/2016   Procedure: Shelda Pal Cath Insertion;  Surgeon: Annice Needy, MD;  Location: [facility] INVASIVE CV LAB;  Service: Cardiovascular;  Laterality: N/A;   PORTA CATH REMOVAL N/A 01/07/2018   Procedure: PORTA CATH REMOVAL;  Surgeon: Annice Needy, MD;  Location: [facility] INVASIVE CV LAB;  Service: Cardiovascular;  Laterality: N/A;   TRANSCATHETER AORTIC VALVE REPLACEMENT, TRANSAORTIC  04/09/2020    Social History   Socioeconomic History   Marital status: Widowed    Spouse name: Not on file   Number of children: Not on file   Years of education: Not on file   Highest education level: Not on file  Occupational History   Not on file  Tobacco Use   Smoking status: Former    Packs/day: 1.5    Types: Cigarettes    Quit date: 01/01/1986    Years since quitting: 36.5   Smokeless tobacco: Former   Tobacco comments:    Smoked for 20 years  Substance and Sexual Activity   Alcohol use: Never   Drug use: Never   Sexual activity: Not on file  Other Topics Concern   Not on file  Social  History Narrative   Not on file   Social Determinants of Health   Financial Resource Strain: Not on file  Food Insecurity: Not on file  Transportation Needs: Not on file  Physical Activity: Not on file  Stress: Not on file  Social Connections: Not on file  Intimate Partner Violence: Not on file    History reviewed. No pertinent family history.   Current Outpatient Medications:    acetaminophen (TYLENOL) 325 MG tablet, Take 650 mg by mouth every 6 (six) hours as needed., Disp: , Rfl:    allopurinol (ZYLOPRIM) 100 MG tablet, Take 1 tablet (100 mg total) by mouth daily., Disp: 30 tablet, Rfl: 0   amLODipine (NORVASC) 10 MG tablet, Take 10 mg by mouth daily., Disp: , Rfl:    apixaban (ELIQUIS) 5 MG TABS tablet, Take 5 mg by mouth 2 (two) times daily., Disp: , Rfl:    atorvastatin (LIPITOR) 40 MG tablet, Take 20 mg by mouth daily., Disp: , Rfl:    carvedilol (COREG) 6.25 MG tablet, Take 6.25 mg by mouth 2 (two) times daily., Disp: , Rfl:    cholecalciferol (VITAMIN D3) 25 MCG (1000 UNIT) tablet, Take 2,000 Units by mouth daily., Disp: , Rfl:    clotrimazole (LOTRIMIN) 1 % cream, Apply topically., Disp: , Rfl:  finasteride (PROSCAR) 5 MG tablet, Take 1 tablet by mouth daily., Disp: , Rfl:    furosemide (LASIX) 40 MG tablet, Take 40 mg by mouth daily. Take additional 40mg  on M, W, F, Disp: , Rfl:    Insulin Glargine (LANTUS [place]), Inject 15 Units into the skin at bedtime., Disp: , Rfl:    liraglutide (VICTOZA) 18 MG/3ML SOPN, Inject 1.2 mg into the skin daily., Disp: , Rfl:    lisinopril (ZESTRIL) 5 MG tablet, Take 1 tablet (5 mg total) by mouth daily., Disp: 30 tablet, Rfl: 0   Multiple Vitamins-Minerals (CENTRUM PO), Take 1 tablet by mouth daily., Disp: , Rfl:    silodosin (RAPAFLO) 4 MG CAPS capsule, Take 1 capsule (4 mg total) by mouth daily with breakfast., Disp: 30 capsule, Rfl: 11   tamsulosin (FLOMAX) 0.4 MG CAPS capsule, Take 0.4 mg by mouth., Disp: , Rfl:    torsemide (DEMADEX)  20 MG tablet, Take 2 tablets by mouth daily., Disp: , Rfl:    vitamin B-12 (CYANOCOBALAMIN) 500 MCG tablet, Take 500 mcg by mouth daily., Disp: , Rfl:    azithromycin (ZITHROMAX) 250 MG tablet, Take 250 mg by mouth as directed. (Patient not taking: Reported on 07/04/2022), Disp: , Rfl:    doxycycline (VIBRAMYCIN) 100 MG capsule, Take 100 mg by mouth 2 (two) times daily. (Patient not taking: Reported on 07/04/2022), Disp: , Rfl:    lisinopril (ZESTRIL) 10 MG tablet, TAKE ONE-HALF TABLET BY MOUTH EVERY DAY FOR BLOOD PRESSURE (Patient not taking: Reported on 07/04/2022), Disp: , Rfl:  No current facility-administered medications for this visit.  Facility-Administered Medications Ordered in Other Visits:    sodium chloride flush (NS) 0.9 % injection 10 mL, 10 mL, Intravenous, PRN, Earna Coder, MD, 10 mL at 03/10/16 0949  Physical exam:  Vitals:   07/04/22 1449  BP: (!) 114/52  Pulse: 61  Resp: 18  Temp: (!) 97.4 F (36.3 C)  TempSrc: Tympanic  SpO2: 96%  Weight: 193 lb 6.4 oz (87.7 kg)  Height: 5\' 11"  (1.803 m)   Physical Exam Cardiovascular:     Rate and Rhythm: Normal rate and regular rhythm.     Heart sounds: Normal heart sounds.  Pulmonary:     Effort: Pulmonary effort is normal.     Breath sounds: Normal breath sounds.  Abdominal:     General: Bowel sounds are normal.     Palpations: Abdomen is soft.     Comments: No palpable hepatosplenomegaly  Lymphadenopathy:     Comments: No palpable cervical, supraclavicular, axillary or inguinal adenopathy    Skin:    General: Skin is warm and dry.  Neurological:     Mental Status: He is alert and oriented to person, place, and time.         Latest Ref Rng & Units 04/29/2022    9:38 AM  CMP  Glucose 70 - 99 mg/dL 161   BUN 8 - 23 mg/dL 34   Creatinine 0.96 - 1.24 mg/dL 0.45   Sodium 409 - 811 mmol/L 140   Potassium 3.5 - 5.1 mmol/L 4.3   Chloride 98 - 111 mmol/L 103   CO2 22 - 32 mmol/L 30   Calcium 8.9 - 10.3 mg/dL  9.0   Total Protein 6.5 - 8.1 g/dL 6.9   Total Bilirubin 0.3 - 1.2 mg/dL 0.8   Alkaline Phos 38 - 126 U/L 57   AST 15 - 41 U/L 13   ALT 0 - 44 U/L 14  Latest Ref Rng & Units 04/29/2022    9:38 AM  CBC  WBC 4.0 - 10.5 K/uL 6.2   Hemoglobin 13.0 - 17.0 g/dL 47.8   Hematocrit 29.5 - 52.0 % 41.2   Platelets 150 - 400 K/uL 124     No images are attached to the encounter.  No results found.   Assessment and plan- Patient is a 89 y.o. male with history of diffuse large B-cell lymphoma back in 2017.  He has now been referred to [place] for weight loss.  Chronically patient does not have any palpable hepatosplenomegaly or lymphadenopathy.  Typically his white cell countAnd has been ranging between 6.2-9.6.  He was noted to have mildly elevated white count of 10.7.  Hemoglobin has remained stable around 12.5 and platelet counts are normal.  His blood work is not suggestive of any significant pancytopenia.  His only B symptom is weight loss and I would like to monitor this with repeat labs and visit in 3 months.  We discussed that weight loss alone is not a reason to undergo a PET scan.  Overall he still has a good quality of life and is feeling better.  He is agreeable to wait on the PET scan at this time.     Visit Diagnosis 1. Encounter for follow-up surveillance of diffuse large B-cell lymphoma      Dr. Owens Shark, MD, MPH [facility] at [facility] [phone] 07/06/2022 11:23 AM

## 2022-10-03 ENCOUNTER — Ambulatory Visit: Payer: Medicare Other | Admitting: Oncology

## 2022-10-03 ENCOUNTER — Other Ambulatory Visit: Payer: Medicare Other

## 2022-10-10 ENCOUNTER — Encounter: Payer: Self-pay | Admitting: Oncology

## 2022-10-10 ENCOUNTER — Inpatient Hospital Stay: Payer: Medicare Other | Admitting: Oncology

## 2022-10-10 ENCOUNTER — Inpatient Hospital Stay: Payer: Medicare Other | Attending: Oncology

## 2022-10-10 VITALS — BP 159/55 | HR 68 | Temp 94.1°F | Resp 18 | Ht 71.0 in | Wt 194.1 lb

## 2022-10-10 DIAGNOSIS — Z86718 Personal history of other venous thrombosis and embolism: Secondary | ICD-10-CM | POA: Insufficient documentation

## 2022-10-10 DIAGNOSIS — Z7901 Long term (current) use of anticoagulants: Secondary | ICD-10-CM | POA: Diagnosis not present

## 2022-10-10 DIAGNOSIS — Z8572 Personal history of non-Hodgkin lymphomas: Secondary | ICD-10-CM | POA: Diagnosis present

## 2022-10-10 DIAGNOSIS — R634 Abnormal weight loss: Secondary | ICD-10-CM | POA: Diagnosis present

## 2022-10-10 DIAGNOSIS — J90 Pleural effusion, not elsewhere classified: Secondary | ICD-10-CM | POA: Insufficient documentation

## 2022-10-10 DIAGNOSIS — Z8579 Personal history of other malignant neoplasms of lymphoid, hematopoietic and related tissues: Secondary | ICD-10-CM

## 2022-10-10 DIAGNOSIS — D696 Thrombocytopenia, unspecified: Secondary | ICD-10-CM | POA: Insufficient documentation

## 2022-10-10 DIAGNOSIS — Z08 Encounter for follow-up examination after completed treatment for malignant neoplasm: Secondary | ICD-10-CM | POA: Diagnosis not present

## 2022-10-10 DIAGNOSIS — Z87891 Personal history of nicotine dependence: Secondary | ICD-10-CM | POA: Diagnosis not present

## 2022-10-10 LAB — CBC WITH DIFFERENTIAL/PLATELET
Abs Immature Granulocytes: 0.04 10*3/uL (ref 0.00–0.07)
Basophils Absolute: 0.1 10*3/uL (ref 0.0–0.1)
Basophils Relative: 1 %
Eosinophils Absolute: 0.2 10*3/uL (ref 0.0–0.5)
Eosinophils Relative: 3 %
HCT: 38.4 % — ABNORMAL LOW (ref 39.0–52.0)
Hemoglobin: 12.5 g/dL — ABNORMAL LOW (ref 13.0–17.0)
Immature Granulocytes: 1 %
Lymphocytes Relative: 25 %
Lymphs Abs: 1.9 10*3/uL (ref 0.7–4.0)
MCH: 32.1 pg (ref 26.0–34.0)
MCHC: 32.6 g/dL (ref 30.0–36.0)
MCV: 98.7 fL (ref 80.0–100.0)
Monocytes Absolute: 0.6 10*3/uL (ref 0.1–1.0)
Monocytes Relative: 8 %
Neutro Abs: 4.8 10*3/uL (ref 1.7–7.7)
Neutrophils Relative %: 62 %
Platelets: 110 10*3/uL — ABNORMAL LOW (ref 150–400)
RBC: 3.89 MIL/uL — ABNORMAL LOW (ref 4.22–5.81)
RDW: 14.8 % (ref 11.5–15.5)
WBC: 7.6 10*3/uL (ref 4.0–10.5)
nRBC: 0 % (ref 0.0–0.2)

## 2022-10-10 LAB — COMPREHENSIVE METABOLIC PANEL
ALT: 18 U/L (ref 0–44)
AST: 14 U/L — ABNORMAL LOW (ref 15–41)
Albumin: 4 g/dL (ref 3.5–5.0)
Alkaline Phosphatase: 51 U/L (ref 38–126)
Anion gap: 6 (ref 5–15)
BUN: 42 mg/dL — ABNORMAL HIGH (ref 8–23)
CO2: 24 mmol/L (ref 22–32)
Calcium: 8.9 mg/dL (ref 8.9–10.3)
Chloride: 108 mmol/L (ref 98–111)
Creatinine, Ser: 1.67 mg/dL — ABNORMAL HIGH (ref 0.61–1.24)
GFR, Estimated: 39 mL/min — ABNORMAL LOW (ref >60)
Glucose, Bld: 94 mg/dL (ref 70–99)
Potassium: 4.9 mmol/L (ref 3.5–5.1)
Sodium: 138 mmol/L (ref 135–145)
Total Bilirubin: 0.5 mg/dL (ref 0.3–1.2)
Total Protein: 7 g/dL (ref 6.5–8.1)

## 2022-10-10 LAB — LACTATE DEHYDROGENASE: LDH: 152 U/L (ref 98–192)

## 2022-10-12 NOTE — Progress Notes (Signed)
Hematology/Oncology Consult note [facility]  Telephone:([phone] Fax:([phone]  Patient Care Team: Lauro Regulus, MD as PCP - General (Internal Medicine) Creig Hines, MD as Consulting Physician (Oncology)   Name of the patient: Hayden Martin  [id number]  [birth year]   Date of visit: 10/12/22  Diagnosis- history of diffuse large B-cell lymphoma  Chief complaint/ Reason for visit-history of diffuse large B-cell lymphoma referred for concerns of unintentional weight loss  Heme/Onc history: Patient is a 89 year old male diagnosed with diffuse large B-cell lymphoma in November 2017.  At that time he had presented with B symptoms. CT-guided retroperitoneal lymph node biopsy on 01/21/2016 revealed a CD30 positive large B-cell lymphoma. Immunohistochemical studies were positive for CD20, CD30, BCL-2, BCL 6, Ki-67 (> 90%) and negative for CD10 and cyclin D1.   Bone marrow biopsy on 01/21/2016 revealed no evidence of lymphoma.  Flow cytometry was negative.  Cytogenetics were normal (46, XY).PET scan on 01/16/2016 revealed extensive hypermetabolic adenopathy within the neck, chest, abdomen and pelvis.  The spleen was enlarged with multi focal hypermetabolic splenic lesions  There was evidence of transperitoneal spread of tumor within the upper abdomen.  There were bilateral pleural effusions.   He received 6 cycles of mini-RCHOP (01/23/2016 - 05/16/2016).  Cycle #1 was complicated by tumor lysis syndrome, leukopenia, and symptomatic pleural effusions.  Diagnostic and 1 liter therapeutic left sided thoracentesis on 02/08/2016 revealed involvement with lymphoma.   PET scan on 04/23/2016 revealed an excellent response to treatment. The extensive adenopathy seen on the prior PET-CT showed remarkable improvement.  There was no residual metabolically active lymphoma is identified. There was a moderate left pleural effusion and small right pleural effusion.    PET scan on 06/06/2016 revealed continued improvement and near resolution. There was only minimal retroperitoneal nodularity/adenopathy, much of which likely represents a residuum from previously treated lymphoma.  The residual periaortic lymph node measured 1.2 cm in short axis with maximum SUV 2.2 (formerly 1.4 cm and 3.6, respectively). The minimal residual nodal activity was Deauville 3.   He has a history of left lower extremity thrombosis.  He has had 2 clots 1 year apart and is on life long anticoagulation.  Bilateral lower extremity duplex on 01/16/2016 revealed DVT in the left lower extremity involving the calf veins and the left popliteal vein.  IVC filter was placed on 01/18/2016.  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  There was interval resolution of the left calf vein DVT.  He is on eliquis    Interval history-patient is doing well and denies any specific complaints at this time.  Appetite has improved.Is weight has remained stable here at the clinic as well as at his home scale over the last 4 months.  ECOG PS- 2 Pain scale- 0   Review of systems- Review of Systems  Constitutional:  Negative for chills, fever, malaise/fatigue and weight loss.  HENT:  Negative for congestion, ear discharge and nosebleeds.   Eyes:  Negative for blurred vision.  Respiratory:  Negative for cough, hemoptysis, sputum production, shortness of breath and wheezing.   Cardiovascular:  Negative for chest pain, palpitations, orthopnea and claudication.  Gastrointestinal:  Negative for abdominal pain, blood in stool, constipation, diarrhea, heartburn, melena, nausea and vomiting.  Genitourinary:  Negative for dysuria, flank pain, frequency, hematuria and urgency.  Musculoskeletal:  Negative for back pain, joint pain and myalgias.  Skin:  Negative for rash.  Neurological:  Negative for  dizziness, tingling, focal weakness, seizures, weakness and headaches.  Endo/Heme/Allergies:  Does not bruise/bleed easily.  Psychiatric/Behavioral:  Negative for depression and suicidal ideas. The patient does not have insomnia.       Allergies  Allergen Reactions   Codeine Itching   Penicillins Itching     Past Medical History:  Diagnosis Date   Cancer (HCC)    CHF (congestive heart failure) (HCC)    Clotting disorder (HCC)    Diabetes mellitus without complication (HCC)    Heart disease    Hypertension      Past Surgical History:  Procedure Laterality Date   HERNIA REPAIR     PACEMAKER PLACEMENT  04/11/2020   PERIPHERAL VASCULAR CATHETERIZATION N/A 01/18/2016   Procedure: IVC Filter Insertion;  Surgeon: Renford Dills, MD;  Location: [facility] INVASIVE CV LAB;  Service: Cardiovascular;  Laterality: N/A;   PERIPHERAL VASCULAR CATHETERIZATION N/A 01/21/2016   Procedure: Shelda Pal Cath Insertion;  Surgeon: Annice Needy, MD;  Location: [facility] INVASIVE CV LAB;  Service: Cardiovascular;  Laterality: N/A;   PORTA CATH REMOVAL N/A 01/07/2018   Procedure: PORTA CATH REMOVAL;  Surgeon: Annice Needy, MD;  Location: [facility] INVASIVE CV LAB;  Service: Cardiovascular;  Laterality: N/A;   TRANSCATHETER AORTIC VALVE REPLACEMENT, TRANSAORTIC  04/09/2020    Social History   Socioeconomic History   Marital status: Widowed    Spouse name: Not on file   Number of children: Not on file   Years of education: Not on file   Highest education level: Not on file  Occupational History   Not on file  Tobacco Use   Smoking status: Former    Current packs/day: 0.00    Types: Cigarettes    Quit date: 01/01/1986    Years since quitting: 36.8   Smokeless tobacco: Former   Tobacco comments:    Smoked for 20 years  Substance and Sexual Activity   Alcohol use: Never   Drug use: Never   Sexual activity: Not on file  Other Topics Concern   Not on file  Social History Narrative   Not on file   Social  Determinants of Health   Financial Resource Strain: Not on file  Food Insecurity: Not on file  Transportation Needs: No Transportation Needs (04/09/2020)   Received from [facility], [facility]   PRAPARE - Transportation    In the past 12 months, has lack of transportation kept you from medical appointments or from getting medications?: No    Lack of Transportation (Non-Medical): No  Physical Activity: Not on file  Stress: Not on file  Social Connections: Not on file  Intimate Partner Violence: Not on file    History reviewed. No pertinent family history.   Current Outpatient Medications:    acetaminophen (TYLENOL) 325 MG tablet, Take 650 mg by mouth every 6 (six) hours as needed., Disp: , Rfl:    allopurinol (ZYLOPRIM) 100 MG tablet, Take 1 tablet (100 mg total) by mouth daily., Disp: 30 tablet, Rfl: 0   amLODipine (NORVASC) 10 MG tablet, Take 10 mg by mouth daily., Disp: , Rfl:    apixaban (ELIQUIS) 5 MG TABS tablet, Take 5 mg by mouth 2 (two) times daily., Disp: , Rfl:    atorvastatin (LIPITOR) 40 MG tablet, Take 20 mg by mouth daily., Disp: , Rfl:    carvedilol (COREG) 6.25 MG tablet, Take 6.25 mg by mouth 2 (two) times daily.,  Disp: , Rfl:    cholecalciferol (VITAMIN D3) 25 MCG (1000 UNIT) tablet, Take 2,000 Units by mouth daily., Disp: , Rfl:    clotrimazole (LOTRIMIN) 1 % cream, Apply topically., Disp: , Rfl:    doxycycline (VIBRAMYCIN) 100 MG capsule, Take 100 mg by mouth 2 (two) times daily., Disp: , Rfl:    finasteride (PROSCAR) 5 MG tablet, Take 1 tablet by mouth daily., Disp: , Rfl:    furosemide (LASIX) 40 MG tablet, Take 40 mg by mouth daily. Take additional 40mg  on M, W, F, Disp: , Rfl:    Insulin Glargine (LANTUS [place]), Inject 15 Units into the skin at bedtime., Disp: , Rfl:    liraglutide (VICTOZA) 18 MG/3ML SOPN, Inject 1.2 mg into the skin daily., Disp: , Rfl:    lisinopril (ZESTRIL) 10 MG tablet, , Disp: , Rfl:    lisinopril  (ZESTRIL) 5 MG tablet, Take 1 tablet (5 mg total) by mouth daily., Disp: 30 tablet, Rfl: 0   Multiple Vitamins-Minerals (CENTRUM PO), Take 1 tablet by mouth daily., Disp: , Rfl:    silodosin (RAPAFLO) 4 MG CAPS capsule, Take 1 capsule (4 mg total) by mouth daily with breakfast., Disp: 30 capsule, Rfl: 11   tamsulosin (FLOMAX) 0.4 MG CAPS capsule, Take 0.4 mg by mouth., Disp: , Rfl:    torsemide (DEMADEX) 20 MG tablet, Take 2 tablets by mouth daily., Disp: , Rfl:    vitamin B-12 (CYANOCOBALAMIN) 500 MCG tablet, Take 500 mcg by mouth daily., Disp: , Rfl:    azithromycin (ZITHROMAX) 250 MG tablet, Take 250 mg by mouth as directed. (Patient not taking: Reported on 07/04/2022), Disp: , Rfl:  No current facility-administered medications for this visit.  Facility-Administered Medications Ordered in Other Visits:    sodium chloride flush (NS) 0.9 % injection 10 mL, 10 mL, Intravenous, PRN, Earna Coder, MD, 10 mL at 03/10/16 0949  Physical exam:  Vitals:   10/10/22 1312 10/10/22 1318  BP: (!) 143/120 (!) 159/55  Pulse: 73 68  Resp: 18   Temp: (!) 94.1 F (34.5 C)   TempSrc: Tympanic   SpO2: 97%   Weight: 194 lb 1.6 oz (88 kg)   Height: 5\' 11"  (1.803 m)    Physical Exam Cardiovascular:     Rate and Rhythm: Normal rate and regular rhythm.     Heart sounds: Normal heart sounds.  Pulmonary:     Effort: Pulmonary effort is normal.     Breath sounds: Normal breath sounds.  Abdominal:     General: Bowel sounds are normal.     Palpations: Abdomen is soft.  Lymphadenopathy:     Comments: No palpable cervical, supraclavicular, axillary or inguinal adenopathy    Skin:    General: Skin is warm and dry.  Neurological:     Mental Status: He is alert and oriented to person, place, and time.         Latest Ref Rng & Units 10/10/2022    1:08 PM  CMP  Glucose 70 - 99 mg/dL 94   BUN 8 - 23 mg/dL 42   Creatinine 8.11 - 1.24 mg/dL 9.14   Sodium 782 - 956 mmol/L 138   Potassium 3.5 - 5.1  mmol/L 4.9   Chloride 98 - 111 mmol/L 108   CO2 22 - 32 mmol/L 24   Calcium 8.9 - 10.3 mg/dL 8.9   Total Protein 6.5 - 8.1 g/dL 7.0   Total Bilirubin 0.3 - 1.2 mg/dL 0.5   Alkaline Phos 38 -  126 U/L 51   AST 15 - 41 U/L 14   ALT 0 - 44 U/L 18       Latest Ref Rng & Units 10/10/2022    1:08 PM  CBC  WBC 4.0 - 10.5 K/uL 7.6   Hemoglobin 13.0 - 17.0 g/dL 29.5   Hematocrit 28.4 - 52.0 % 38.4   Platelets 150 - 400 K/uL 110      Assessment and plan- Patient is a 89 y.o. male with history of diffuse large B-cell lymphoma back in 2017.   It has been 7 years since the diagnosis of his diffuse large B-cell lymphoma and typically recurrence risk is low after 5 years.  Initially patient was referred to me for concerns of weight loss which has since then resolved and patient feels well overall and has a good appetite and quality of life.  Clinically patient does not have any evidence of palpable adenopathy or splenomegaly.His labs are also fairly unremarkable except for chronic thrombocytopenia which she has had over the last 3 years without a clear downward trend.  White count and hemoglobin are normal.  Patient does not require any additional evaluation such as a bone marrow biopsy or a PET scan at this time.  He can continue to follow-up with his primary care Dr. Dareen Piano and can be referred to [place] in the future if any questions or concerns arise   Visit Diagnosis 1. Encounter for follow-up surveillance of diffuse large B-cell lymphoma      Dr. Owens Shark, MD, MPH [facility] at [facility] [phone] 10/12/2022 7:46 AM

## 2023-05-01 ENCOUNTER — Other Ambulatory Visit: Payer: Medicare Other

## 2023-05-01 ENCOUNTER — Ambulatory Visit: Payer: Medicare Other | Admitting: Oncology
